# Patient Record
Sex: Female | Born: 1945
Health system: Southern US, Community
[De-identification: ages and names within clinical notes are randomized; demographics above are authoritative.]

## PROBLEM LIST (undated history)

## (undated) DIAGNOSIS — E7800 Pure hypercholesterolemia, unspecified: Secondary | ICD-10-CM

## (undated) DIAGNOSIS — G4730 Sleep apnea, unspecified: Secondary | ICD-10-CM

## (undated) DIAGNOSIS — R011 Cardiac murmur, unspecified: Secondary | ICD-10-CM

## (undated) DIAGNOSIS — D699 Hemorrhagic condition, unspecified: Secondary | ICD-10-CM

## (undated) DIAGNOSIS — E559 Vitamin D deficiency, unspecified: Secondary | ICD-10-CM

## (undated) DIAGNOSIS — C539 Malignant neoplasm of cervix uteri, unspecified: Secondary | ICD-10-CM

## (undated) DIAGNOSIS — I10 Essential (primary) hypertension: Secondary | ICD-10-CM

## (undated) DIAGNOSIS — F99 Mental disorder, not otherwise specified: Secondary | ICD-10-CM

## (undated) DIAGNOSIS — E785 Hyperlipidemia, unspecified: Secondary | ICD-10-CM

## (undated) DIAGNOSIS — E119 Type 2 diabetes mellitus without complications: Secondary | ICD-10-CM

## (undated) HISTORY — DX: Mental disorder, not otherwise specified: F99

## (undated) HISTORY — PX: TONSILLECTOMY: SUR1361

## (undated) HISTORY — PX: SALPINGECTOMY: SHX328

## (undated) HISTORY — DX: Cardiac murmur, unspecified: R01.1

## (undated) HISTORY — DX: Essential (primary) hypertension: I10

## (undated) HISTORY — DX: Type 2 diabetes mellitus without complications: E11.9

## (undated) HISTORY — DX: Vitamin D deficiency, unspecified: E55.9

## (undated) HISTORY — PX: COLOSTOMY: SHX63

## (undated) HISTORY — DX: Pure hypercholesterolemia, unspecified: E78.00

## (undated) HISTORY — PX: TONSILLECTOMY AND ADENOIDECTOMY: SHX28

## (undated) HISTORY — PX: CHOLECYSTECTOMY: SHX55

## (undated) HISTORY — DX: Hyperlipidemia, unspecified: E78.5

## (undated) HISTORY — PX: BILATERAL OOPHORECTOMY: SHX1221

## (undated) HISTORY — DX: Sleep apnea, unspecified: G47.30

## (undated) HISTORY — DX: Malignant neoplasm of cervix uteri, unspecified: C53.9

## (undated) HISTORY — DX: Hemorrhagic condition, unspecified: D69.9

---

## 2008-05-19 DIAGNOSIS — C539 Malignant neoplasm of cervix uteri, unspecified: Secondary | ICD-10-CM

## 2008-05-19 HISTORY — DX: Malignant neoplasm of cervix uteri, unspecified: C53.9

## 2011-04-13 DIAGNOSIS — C539 Malignant neoplasm of cervix uteri, unspecified: Secondary | ICD-10-CM | POA: Insufficient documentation

## 2012-11-13 DIAGNOSIS — M5136 Other intervertebral disc degeneration, lumbar region: Secondary | ICD-10-CM | POA: Insufficient documentation

## 2016-11-06 ENCOUNTER — Encounter: Payer: Self-pay | Admitting: *Deleted

## 2016-12-11 ENCOUNTER — Other Ambulatory Visit: Payer: Self-pay | Admitting: Obstetrics and Gynecology

## 2016-12-19 ENCOUNTER — Other Ambulatory Visit (HOSPITAL_COMMUNITY)
Admission: RE | Admit: 2016-12-19 | Discharge: 2016-12-19 | Disposition: A | Discharge status: Home / Self Care | Payer: Medicare Other | Source: Ambulatory Visit | Attending: Obstetrics and Gynecology | Admitting: Obstetrics and Gynecology

## 2016-12-19 ENCOUNTER — Encounter (INDEPENDENT_AMBULATORY_CARE_PROVIDER_SITE_OTHER): Payer: Self-pay

## 2016-12-19 ENCOUNTER — Encounter: Payer: Self-pay | Admitting: Obstetrics and Gynecology

## 2016-12-19 ENCOUNTER — Ambulatory Visit (INDEPENDENT_AMBULATORY_CARE_PROVIDER_SITE_OTHER): Payer: Medicare Other | Admitting: Obstetrics and Gynecology

## 2016-12-19 VITALS — BP 158/70 | HR 70 | Ht 64.5 in | Wt 164.0 lb

## 2016-12-19 DIAGNOSIS — Z8541 Personal history of malignant neoplasm of cervix uteri: Secondary | ICD-10-CM

## 2016-12-19 DIAGNOSIS — Z124 Encounter for screening for malignant neoplasm of cervix: Secondary | ICD-10-CM | POA: Diagnosis present

## 2016-12-19 NOTE — Progress Notes (Signed)
Patient ID: Jessica Mclean, female   DOB: [birth year], 71 y.o.   MRN: [id number]  Assessment:  Annual Gyn Exam  History of cervical cancer stage #2  B treated with radiation therapy 8 years ago history of radiation colitis requiring colostomy and reversal   Plan:  1. pap smear done, next pap due 1 year 2. return annually or prn 3    Annual mammogram advised Subjective:  Jessica Mclean is a 71 y.o. female G5P0 who presents for annual exam. No LMP recorded. Patient is postmenopausal. The patient has complaints today of having a past medical history of 2B cervical cancer (8 years ago) and needing a pap smear today. She states that she was informed by her PCP that she has had unreadable pap smears for several years. Pt states that following her initial dx, she had pap smears completed q 1 month, then q 3 months, then q 6 months, and now annually. Pt reports that following radiation treatment, she had a colostomy bag that was reversed. Pt reports that she has an appointment with her PCP later today. Pt reports that she is initially from [place]. Pt denies any other symptoms. She notes that she has had a left salpingo-oophorectomy.   The following portions of the patient's history were reviewed and updated as appropriate: allergies, current medications, past family history, past medical history, past social history, past surgical history and problem list. Past Medical History:  Diagnosis Date  . Cervical cancer ([place]) 2010  . Diabetes mellitus without complication ([place])    type 2  . HLD (hyperlipidemia)   . Hypertension   . Mental disorder    depression; suicidal ideation in 2017  . Vitamin D deficiency     Past Surgical History:  Procedure Laterality Date  . BILATERAL OOPHORECTOMY    . CESAREAN SECTION     x 4  . CHOLECYSTECTOMY    . COLOSTOMY     x2  . SALPINGECTOMY    . TONSILLECTOMY AND ADENOIDECTOMY       Current Outpatient Prescriptions:  .  amLODipine (NORVASC) 5 MG tablet,  Take 5 mg by mouth daily., Disp: , Rfl:  .  aspirin 81 MG chewable tablet, Chew 81 mg by mouth daily., Disp: , Rfl:  .  atorvastatin (LIPITOR) 40 MG tablet, Take 40 mg by mouth daily., Disp: , Rfl:  .  Cholecalciferol (VITAMIN D3) 1000 units CAPS, Take 1 capsule by mouth daily., Disp: , Rfl:  .  CRANBERRY EXTRACT PO, Take by mouth. gummies-daily, Disp: , Rfl:  .  DULoxetine (CYMBALTA) 20 MG capsule, Take 20 mg by mouth daily., Disp: , Rfl:  .  gabapentin (NEURONTIN) 100 MG capsule, Take 100 mg by mouth 3 (three) times daily., Disp: , Rfl:  .  glipiZIDE (GLUCOTROL) 5 MG tablet, Take by mouth daily before breakfast., Disp: , Rfl:  .  insulin regular (NOVOLIN R,HUMULIN R) 100 units/mL injection, Inject into the skin 2 (two) times daily before a meal. , Disp: , Rfl:  .  lisinopril (PRINIVIL,ZESTRIL) 20 MG tablet, Take 20 mg by mouth daily., Disp: , Rfl:  .  metFORMIN (GLUCOPHAGE) 1000 MG tablet, Take 1,000 mg by mouth daily with breakfast., Disp: , Rfl:  .  OMEGA-3-ACID ETH EST, DIETARY, PO, Take by mouth daily. , Disp: , Rfl:  .  polyethylene glycol powder (MIRALAX) powder, Take 1 Container by mouth as needed., Disp: , Rfl:  .  valACYclovir (VALTREX) 1000 MG tablet, Take 1,000 mg by mouth as needed. , Disp: ,  Rfl:   Review of Systems Constitutional: negative Gastrointestinal: negative Genitourinary: negative   Objective:  BP (!) 158/70 (BP Location: Left Arm, Patient Position: Sitting, Cuff Size: Normal)   Pulse 70   Ht 5' 4.5" (1.638 m)   Wt 164 lb (74.4 kg)   BMI 27.72 kg/m    BMI: Body mass index is 27.72 kg/m.  General Appearance: Alert, appropriate appearance for age. No acute distress HEENT: Grossly normal Neck / Thyroid:  Cardiovascular: RRR; normal S1, S2, no murmur Lungs: CTA bilaterally Back: No CVAT Breast Exam: No dimpling, nipple retraction or discharge. No masses or nodes., Normal to inspection, Normal breast tissue bilaterally and No masses or nodes.No dimpling, nipple  retraction or discharge. Gastrointestinal: Soft, non-tender, no masses or organomegaly. Multiple surgical scars noted to abdomen. Pelvic Exam: Vulva and vagina appear normal. Bimanual exam reveals normal uterus and adnexa. Vaginal: normal without tenderness, induration or masses, normal rugae and Shortened vagina to 4 cm. Atrophic vaginal apex. Well supported. Cervix: normal appearance Adnexa: absent, left Uterus: normal single, nontender Rectal: good sphincter tone, no masses, guaiac negative and extensive anal skin tags Rectovaginal: not indicated Lymphatic Exam: Non-palpable nodes in  axillary, or inguinal regions  Skin: no rash or abnormalities Neurologic: Normal gait and speech, no tremor  Psychiatric: Alert and oriented, appropriate affect.   Urinalysis:Not done  Mallory Shirk. MD Pgr [phone] 1:30 PM   By signing my name below, I, Margit Banda, attest that this documentation has been prepared under the direction and in the presence of Jonnie Kind, MD. Electronically Signed: Margit Banda, Medical Scribe. 12/19/16. 1:31 PM.  I personally performed the services described in this documentation, which was SCRIBED in my presence. The recorded information has been reviewed and considered accurate. It has been edited as necessary during review. Jonnie Kind, MD

## 2016-12-23 LAB — CYTOLOGY - PAP
DIAGNOSIS: NEGATIVE
HPV: NOT DETECTED

## 2017-07-02 DIAGNOSIS — B351 Tinea unguium: Secondary | ICD-10-CM | POA: Diagnosis not present

## 2017-07-02 DIAGNOSIS — Z794 Long term (current) use of insulin: Secondary | ICD-10-CM | POA: Diagnosis not present

## 2017-07-02 DIAGNOSIS — I10 Essential (primary) hypertension: Secondary | ICD-10-CM | POA: Diagnosis not present

## 2017-07-02 DIAGNOSIS — E559 Vitamin D deficiency, unspecified: Secondary | ICD-10-CM | POA: Diagnosis not present

## 2017-07-02 DIAGNOSIS — E1140 Type 2 diabetes mellitus with diabetic neuropathy, unspecified: Secondary | ICD-10-CM | POA: Diagnosis not present

## 2017-09-29 ENCOUNTER — Other Ambulatory Visit (HOSPITAL_COMMUNITY): Payer: Self-pay | Admitting: Family Medicine

## 2017-09-29 ENCOUNTER — Encounter: Payer: Self-pay | Admitting: Orthopaedic Surgery

## 2017-09-29 ENCOUNTER — Ambulatory Visit (HOSPITAL_COMMUNITY)
Admission: RE | Admit: 2017-09-29 | Discharge: 2017-09-29 | Disposition: A | Discharge status: Home / Self Care | Payer: Medicare Other | Source: Ambulatory Visit | Attending: Family Medicine | Admitting: Family Medicine

## 2017-09-29 DIAGNOSIS — R059 Cough, unspecified: Secondary | ICD-10-CM

## 2017-09-29 DIAGNOSIS — R05 Cough: Secondary | ICD-10-CM | POA: Diagnosis not present

## 2017-09-29 DIAGNOSIS — E559 Vitamin D deficiency, unspecified: Secondary | ICD-10-CM | POA: Diagnosis not present

## 2017-09-29 DIAGNOSIS — I10 Essential (primary) hypertension: Secondary | ICD-10-CM | POA: Diagnosis not present

## 2017-09-29 DIAGNOSIS — E119 Type 2 diabetes mellitus without complications: Secondary | ICD-10-CM | POA: Diagnosis not present

## 2017-09-29 DIAGNOSIS — R5383 Other fatigue: Secondary | ICD-10-CM | POA: Diagnosis not present

## 2017-10-13 DIAGNOSIS — E119 Type 2 diabetes mellitus without complications: Secondary | ICD-10-CM | POA: Diagnosis not present

## 2017-10-13 DIAGNOSIS — I10 Essential (primary) hypertension: Secondary | ICD-10-CM | POA: Diagnosis not present

## 2017-10-30 DIAGNOSIS — R300 Dysuria: Secondary | ICD-10-CM | POA: Diagnosis not present

## 2017-10-30 DIAGNOSIS — E1169 Type 2 diabetes mellitus with other specified complication: Secondary | ICD-10-CM | POA: Diagnosis not present

## 2017-11-10 DIAGNOSIS — E119 Type 2 diabetes mellitus without complications: Secondary | ICD-10-CM | POA: Diagnosis not present

## 2017-11-10 DIAGNOSIS — M25511 Pain in right shoulder: Secondary | ICD-10-CM | POA: Diagnosis not present

## 2017-11-10 DIAGNOSIS — N390 Urinary tract infection, site not specified: Secondary | ICD-10-CM | POA: Diagnosis not present

## 2017-11-18 DIAGNOSIS — L255 Unspecified contact dermatitis due to plants, except food: Secondary | ICD-10-CM | POA: Diagnosis not present

## 2017-11-18 DIAGNOSIS — I10 Essential (primary) hypertension: Secondary | ICD-10-CM | POA: Diagnosis not present

## 2017-11-18 DIAGNOSIS — Z79899 Other long term (current) drug therapy: Secondary | ICD-10-CM | POA: Diagnosis not present

## 2017-12-22 ENCOUNTER — Ambulatory Visit: Payer: Medicare Other | Admitting: Orthopaedic Surgery

## 2017-12-30 ENCOUNTER — Ambulatory Visit: Payer: Medicare Other | Admitting: Orthopaedic Surgery

## 2018-01-06 ENCOUNTER — Ambulatory Visit (INDEPENDENT_AMBULATORY_CARE_PROVIDER_SITE_OTHER): Payer: Medicare Other

## 2018-01-06 ENCOUNTER — Encounter: Payer: Self-pay | Admitting: Orthopaedic Surgery

## 2018-01-06 ENCOUNTER — Ambulatory Visit (INDEPENDENT_AMBULATORY_CARE_PROVIDER_SITE_OTHER): Payer: Medicare Other | Admitting: Orthopaedic Surgery

## 2018-01-06 VITALS — BP 137/87 | HR 57 | Temp 98.1°F | Ht 64.0 in | Wt 157.0 lb

## 2018-01-06 DIAGNOSIS — E119 Type 2 diabetes mellitus without complications: Secondary | ICD-10-CM

## 2018-01-06 DIAGNOSIS — M25511 Pain in right shoulder: Secondary | ICD-10-CM

## 2018-01-06 DIAGNOSIS — M7530 Calcific tendinitis of unspecified shoulder: Secondary | ICD-10-CM

## 2018-01-06 NOTE — Progress Notes (Signed)
DG  °

## 2018-01-06 NOTE — Progress Notes (Signed)
Subjective:    Patient ID: Jessica Mclean, female    DOB: 1945-10-11, 72 y.o.   MRN: [id number]  HPI She has a long history of shoulder pain.  She has been treated in [place] in the past for bilateral shoulder pain years ago and had an injection in the past which helped.  For the last two years she has had pain in the right shoulder after a fall.  It is gradually getting worse. She has pain when trying to raise her arm over head. She has pain when gardening which she loves to do.  She has pain when rolling over on it at night in bed.  She has no swelling, no numbness.  She is concerned it is still hurting and not getting any better.  She has tried ice, heat, Advil, rubs and rest with just slight help. She has been seen by her family doctor and I have read the notes.  She has a service dog with her for her diabetes.  She has passed out in the past and needs the dog to alert someone if she does faint or pass out. Review of Systems  Constitutional: Positive for activity change.  Musculoskeletal: Positive for arthralgias, gait problem and joint swelling.  All other systems reviewed and are negative.  For Review of Systems, all other systems reviewed and are negative.  The following is a summary of the past history medically, past history surgically, known current medicines, social history and family history.  This information is gathered electronically by the computer from prior information and documentation.  I review this each visit and have found including this information at this point in the chart is beneficial and informative.   Past Medical History:  Diagnosis Date  . Cervical cancer ([place]) 2010  . Diabetes mellitus without complication ([place])    type 2  . HLD (hyperlipidemia)   . Hypertension   . Mental disorder    depression; suicidal ideation in 2017  . Vitamin D deficiency     Past Surgical History:  Procedure Laterality Date  . BILATERAL OOPHORECTOMY    . CESAREAN SECTION      x 4  . CHOLECYSTECTOMY    . COLOSTOMY     x2  . SALPINGECTOMY    . TONSILLECTOMY AND ADENOIDECTOMY      Current Outpatient Medications on File Prior to Visit  Medication Sig Dispense Refill  . amLODipine (NORVASC) 5 MG tablet Take 5 mg by mouth daily.    Marland Kitchen aspirin 81 MG chewable tablet Chew 81 mg by mouth daily.    Marland Kitchen atorvastatin (LIPITOR) 40 MG tablet Take 40 mg by mouth daily.    . Cholecalciferol (VITAMIN D3) 1000 units CAPS Take 1 capsule by mouth daily.    Marland Kitchen CRANBERRY EXTRACT PO Take by mouth. gummies-daily    . DULoxetine (CYMBALTA) 20 MG capsule Take 20 mg by mouth daily.    Marland Kitchen gabapentin (NEURONTIN) 100 MG capsule Take 100 mg by mouth 3 (three) times daily.    Marland Kitchen glipiZIDE (GLUCOTROL) 5 MG tablet Take by mouth daily before breakfast.    . insulin regular (NOVOLIN R,HUMULIN R) 100 units/mL injection Inject into the skin 2 (two) times daily before a meal.     . lisinopril (PRINIVIL,ZESTRIL) 20 MG tablet Take 20 mg by mouth daily.    . metFORMIN (GLUCOPHAGE) 1000 MG tablet Take 1,000 mg by mouth daily with breakfast.    . OMEGA-3-ACID ETH EST, DIETARY, PO Take by mouth daily.     Marland Kitchen  polyethylene glycol powder (MIRALAX) powder Take 1 Container by mouth as needed.    . valACYclovir (VALTREX) 1000 MG tablet Take 1,000 mg by mouth as needed.      No current facility-administered medications on file prior to visit.     Social History   Socioeconomic History  . Marital status: Divorced    Spouse name: Not on file  . Number of children: Not on file  . Years of education: Not on file  . Highest education level: Not on file  Occupational History  . Not on file  Social Needs  . Financial resource strain: Not on file  . Food insecurity:    Worry: Not on file    Inability: Not on file  . Transportation needs:    Medical: Not on file    Non-medical: Not on file  Tobacco Use  . Smoking status: Never Smoker  . Smokeless tobacco: Never Used  Substance and Sexual Activity  .  Alcohol use: No  . Drug use: No  . Sexual activity: Not Currently    Birth control/protection: Post-menopausal  Lifestyle  . Physical activity:    Days per week: Not on file    Minutes per session: Not on file  . Stress: Not on file  Relationships  . Social connections:    Talks on phone: Not on file    Gets together: Not on file    Attends religious service: Not on file    Active member of club or organization: Not on file    Attends meetings of clubs or organizations: Not on file    Relationship status: Not on file  . Intimate partner violence:    Fear of current or ex partner: Not on file    Emotionally abused: Not on file    Physically abused: Not on file    Forced sexual activity: Not on file  Other Topics Concern  . Not on file  Social History Narrative  . Not on file    Family History  Problem Relation Age of Onset  . Cancer Father        kidney  . Cancer Mother        lung  . Other Brother        heart issues    BP 137/87   Pulse (!) 57   Temp 98.1 F (36.7 C)   Ht 5\' 4"  (1.626 m)   Wt 157 lb (71.2 kg)   BMI 26.95 kg/m   Body mass index is 26.95 kg/m.     Objective:   Physical Exam  Constitutional: She is oriented to person, place, and time. She appears well-developed and well-nourished.  HENT:  Head: Normocephalic and atraumatic.  Eyes: Pupils are equal, round, and reactive to light. Conjunctivae and EOM are normal.  Neck: Normal range of motion. Neck supple.  Cardiovascular: Normal rate, regular rhythm and intact distal pulses.  Pulmonary/Chest: Effort normal.  Abdominal: Soft.  Musculoskeletal:       Right shoulder: She exhibits decreased range of motion and tenderness.       Arms: Neurological: She is alert and oriented to person, place, and time. She has normal reflexes. She displays normal reflexes. No cranial nerve deficit. She exhibits normal muscle tone. Coordination normal.  Skin: Skin is warm and dry.  Psychiatric: She has a normal  mood and affect. Her behavior is normal. Judgment and thought content normal.   X-rays were done of the right shoulder, reported separately.  Assessment & Plan:   Encounter Diagnoses  Name Primary?  . Pain in joint of right shoulder Yes  . Calcific bursitis of shoulder   . Diabetes mellitus without complication ([place])    PROCEDURE NOTE:  The patient request injection, verbal consent was obtained.  The right shoulder was prepped appropriately after time out was performed.   Sterile technique was observed and injection of 1 cc of Depo-Medrol 40 mg with several cc's of plain xylocaine. Anesthesia was provided by ethyl chloride and a 20-gauge needle was used to inject the shoulder area. A posterior approach was used.  The injection was tolerated well.  A band aid dressing was applied.  The patient was advised to apply ice later today and tomorrow to the injection sight as needed.  I have arranged for her to go to PT/OT for the right shoulder.  I will see her back in two weeks.  She may need a MRI.  Call if any problem.  Precautions discussed.   Electronically Signed Sanjuana Kava, MD 8/21/20193:09 PM

## 2018-01-20 ENCOUNTER — Ambulatory Visit (INDEPENDENT_AMBULATORY_CARE_PROVIDER_SITE_OTHER): Payer: Medicare Other | Admitting: Obstetrics and Gynecology

## 2018-01-20 ENCOUNTER — Other Ambulatory Visit: Payer: Self-pay

## 2018-01-20 ENCOUNTER — Other Ambulatory Visit (HOSPITAL_COMMUNITY)
Admission: RE | Admit: 2018-01-20 | Discharge: 2018-01-20 | Disposition: A | Discharge status: Home / Self Care | Payer: Medicare Other | Source: Ambulatory Visit | Attending: Obstetrics and Gynecology | Admitting: Obstetrics and Gynecology

## 2018-01-20 ENCOUNTER — Ambulatory Visit (HOSPITAL_COMMUNITY): Payer: Medicare Other | Attending: Internal Medicine | Admitting: Occupational Therapy

## 2018-01-20 ENCOUNTER — Encounter (HOSPITAL_COMMUNITY): Payer: Self-pay | Admitting: Occupational Therapy

## 2018-01-20 ENCOUNTER — Encounter: Payer: Self-pay | Admitting: Obstetrics and Gynecology

## 2018-01-20 VITALS — BP 159/70 | HR 70 | Ht 64.5 in | Wt 162.0 lb

## 2018-01-20 DIAGNOSIS — G8929 Other chronic pain: Secondary | ICD-10-CM

## 2018-01-20 DIAGNOSIS — R29898 Other symptoms and signs involving the musculoskeletal system: Secondary | ICD-10-CM

## 2018-01-20 DIAGNOSIS — Z01419 Encounter for gynecological examination (general) (routine) without abnormal findings: Secondary | ICD-10-CM | POA: Diagnosis not present

## 2018-01-20 DIAGNOSIS — M25511 Pain in right shoulder: Secondary | ICD-10-CM | POA: Insufficient documentation

## 2018-01-20 DIAGNOSIS — Z124 Encounter for screening for malignant neoplasm of cervix: Secondary | ICD-10-CM

## 2018-01-20 DIAGNOSIS — Z8541 Personal history of malignant neoplasm of cervix uteri: Secondary | ICD-10-CM

## 2018-01-20 NOTE — Therapy (Signed)
[facility] [address] [place], [place], [address] Phone: [phone]   Fax:  [phone]  Occupational Therapy Evaluation  Patient Details  Name: Lakeeta Dobosz MRN: [id number] Date of Birth: 1945/06/13 Referring Provider: Dr. Sanjuana Kava   Encounter Date: 01/20/2018  OT End of Session - 01/20/18 1438    Visit Number  1    Number of Visits  1    Date for OT Re-Evaluation  01/21/18    Authorization Type  1) Medicare A and B 2) [employer] Medicare    Authorization Time Period  Visits based on medical necessity     OT Start Time  1116    OT Stop Time  1158    OT Time Calculation (min)  42 min    Activity Tolerance  Patient tolerated treatment well    Behavior During Therapy  [facility] for tasks assessed/performed       Past Medical History:  Diagnosis Date  . Cervical cancer ([place]) 2010  . Diabetes mellitus without complication ([place])    type 2  . HLD (hyperlipidemia)   . Hypertension   . Mental disorder    depression; suicidal ideation in 2017  . Vitamin D deficiency     Past Surgical History:  Procedure Laterality Date  . BILATERAL OOPHORECTOMY    . CESAREAN SECTION     x 4  . CHOLECYSTECTOMY    . COLOSTOMY     x2  . SALPINGECTOMY    . TONSILLECTOMY AND ADENOIDECTOMY      There were no vitals filed for this visit.  Subjective Assessment - 01/20/18 1417    Subjective   S: My shoulder feels better after my injection last week.     Pertinent History  Pt is a 72 y/o female with chronic R shoulder pain. Pt reports having had R rotator cuff tear from a few years ago without surgical repair. She reports decreased pain in R shoulder since injection received approximately 1 week ago. Pt was referred to OT for evaluation and treatment by Dr. Sanjuana Kava.     Special Tests  FOTO: 34/100    Patient Stated Goals  To decrease pain and be able to use RUE with greater ease during functional tasks.    Currently in Pain?  No/denies         [facility] OT Assessment - 01/20/18 1113      Assessment   Medical Diagnosis  right shoulder pain    Referring Provider  Dr. Sanjuana Kava    Onset Date/Surgical Date  --   chronic   Hand Dominance  Right    Next MD Visit  01/21/18    Prior Therapy  None      Precautions   Precautions  None      Restrictions   Weight Bearing Restrictions  No      Balance Screen   Has the patient fallen in the past 6 months  Yes    How many times?  2    Has the patient had a decrease in activity level because of a fear of falling?   No    Is the patient reluctant to leave their home because of a fear of falling?   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 Pt experiences constant pain in RUE limiting activity tolerance and ability to perform functional tasks. Pt reports improvement in all of the aforementioned areas since injection.       Mobility   Mobility Status  Independent      Written Expression   Dominant Hand  Right      Vision - History   Baseline Vision  Wears glasses all the time    Additional Comments  --      Cognition   Overall Cognitive Status  Within Functional Limits for tasks assessed      Observation/Other Assessments   Focus on Therapeutic Outcomes (FOTO)   34/100      ROM / Strength   AROM / PROM / Strength  AROM;PROM;Strength      AROM   Overall AROM Comments  Assessed seated, er/IR adducted    AROM Assessment Site  Shoulder    Right/Left Shoulder  Right    Right Shoulder Flexion  140 Degrees    Right Shoulder ABduction  150 Degrees    Right Shoulder Internal Rotation  90 Degrees    Right Shoulder External Rotation  85 Degrees      PROM   Overall PROM   Within functional limits for tasks performed    PROM Assessment Site  --    Right/Left Shoulder  --      Strength    Overall Strength Comments  Assessed seated, er/IR adducted    Strength Assessment Site  Shoulder    Right/Left Shoulder  Right    Right Shoulder Flexion  5/5    Right Shoulder ABduction  4+/5    Right Shoulder Internal Rotation  5/5    Right Shoulder External Rotation  5/5                      OT Education - 01/20/18 1201    Education Details  green scapular theraband, shoulder stretches    Person(s) Educated  Patient    Methods  Explanation;Demonstration;Handout    Comprehension  Verbalized understanding;Returned demonstration       OT Short Term Goals - 01/20/18 1450      OT SHORT TERM GOAL #1   Title  Pt will be educated and able to independently perform HEP for improved RUE functional use.     Time  1    Period  Days    Status  Achieved    Target Date  01/20/18               Plan - 01/20/18 1440    Clinical Impression Statement  A: Pt is a 72 y/o female presenting with chronic R shoulder pain, limiting ability to perform functional tasks with ease. Pt received injection approximately 1 week ago and reports minimal pain since injection. During evaluation pt demonstrating ROM and strength WFL. Pt requesting HEP only, provided with scapular theraband exercises and shoulder stretches. Pt performed exercises with good form and technique.     Occupational Profile and client history currently impacting functional performance  Pt is independent with ADLs and IADLs and is motivated to maintain current functional level.     Occupational performance deficits (Please refer to evaluation for details):  ADL's;IADL's;Leisure    Rehab Potential  Good    OT Frequency  One time visit    OT Treatment/Interventions  Patient/family education    Plan  P: Pt was seen by OT for one  time visit. She was provided with education and HEP to improve pain and functional use of RUE during ADLs.     Clinical Decision Making  Limited treatment options, no task modification necessary     OT Home Exercise Plan  01/20/18 scapular theraband and shoulder stretches    Consulted and Agree with Plan of Care  Patient       Patient will benefit from skilled therapeutic intervention in order to improve the following deficits and impairments:  Decreased activity tolerance, Impaired flexibility, Decreased strength, Pain  Visit Diagnosis: Chronic right shoulder pain  Other symptoms and signs involving the musculoskeletal system    Problem List Patient Active Problem List   Diagnosis Date Noted  . Diabetes mellitus without complication [facility]) 25/83/4621   Guadelupe Sabin, OTR/L  [phone] 01/20/2018, 2:52 PM  [facility] [address] [place], [place], [address] Phone: [phone]   Fax:  [phone]  Name: Lauryl Seyer MRN: [id number] Date of Birth: [birth year]

## 2018-01-20 NOTE — Patient Instructions (Signed)
(  Home) Extension: Isometric / Bilateral Arm Retraction - Sitting   Facing anchor, hold hands and elbow at shoulder height, with elbow bent.  Pull arms back to squeeze shoulder blades together. Repeat 10-15 times. 1-3 times/day.   (Clinic) Extension / Flexion (Assist)   Face anchor, pull arms back, keeping elbow straight, and squeze shoulder blades together. Repeat 10-15 times. 1-3 times/day.   Copyright  VHI. All rights reserved.   (Home) Retraction: Row - Bilateral (Anchor)   Facing anchor, arms reaching forward, pull hands toward stomach, keeping elbows bent and at your sides and pinching shoulder blades together. Repeat 10-15 times. 1-3 times/day.   Copyright  VHI. All rights reserved.     1) Flexion Wall Stretch    Face wall, place affected handon wall in front of you. Slide hand up the wall  and lean body in towards the wall. Hold for 10 seconds. Repeat 3-5 times. 1-2 times/day.     2) Towel Stretch with Internal Rotation   Or     Gently pull up (or to the side) your affected arm  behind your back with the assist of a towel. Hold 10 seconds, repeat 3-5 times. 1-2 times/day.             3) Corner Stretch    Stand at a corner of a wall, place your arms on the walls with elbows bent. Lean into the corner until a stretch is felt along the front of your chest and/or shoulders. Hold for 10 seconds. Repeat 3-5X, 1-2 times/day.    4) Posterior Capsule Stretch    Bring the involved arm across chest. Grasp elbow and pull toward chest until you feel a stretch in the back of the upper arm and shoulder. Hold 10 seconds. Repeat 3-5X. Complete 1-2 times/day.

## 2018-01-20 NOTE — Progress Notes (Signed)
Patient ID: Jessica Mclean, female   DOB: [birth year], 72 y.o.   MRN: [id number]   Assessment:  Annual Gyn Exam Plan:  1. return annually Subjective:  Jessica Mclean is a 72 y.o. female G5P0 who presents for annual exam. No LMP recorded. Patient is postmenopausal.  She has hx of cervical cancer, she had radiation therapy and was overdosed due to dosage miscalculation, and ended up with colostomy bag and was in a wheelchair for a few years.  The following portions of the patient's history were reviewed and updated as appropriate: allergies, current medications, past family history, past medical history, past social history, past surgical history and problem list. Past Medical History:  Diagnosis Date  . Cervical cancer ([place]) 2010  . Diabetes mellitus without complication ([place])    type 2  . HLD (hyperlipidemia)   . Hypertension   . Mental disorder    depression; suicidal ideation in 2017  . Vitamin D deficiency     Past Surgical History:  Procedure Laterality Date  . BILATERAL OOPHORECTOMY    . CESAREAN SECTION     x 4  . CHOLECYSTECTOMY    . COLOSTOMY     x2  . SALPINGECTOMY    . TONSILLECTOMY AND ADENOIDECTOMY       Current Outpatient Medications:  .  amLODipine (NORVASC) 5 MG tablet, Take 5 mg by mouth daily., Disp: , Rfl:  .  aspirin 81 MG chewable tablet, Chew 81 mg by mouth daily., Disp: , Rfl:  .  atorvastatin (LIPITOR) 40 MG tablet, Take 40 mg by mouth daily., Disp: , Rfl:  .  Cholecalciferol (VITAMIN D3) 1000 units CAPS, Take 1 capsule by mouth daily., Disp: , Rfl:  .  CRANBERRY EXTRACT PO, Take by mouth. gummies-daily, Disp: , Rfl:  .  DULoxetine (CYMBALTA) 20 MG capsule, Take 20 mg by mouth daily., Disp: , Rfl:  .  gabapentin (NEURONTIN) 100 MG capsule, Take 100 mg by mouth 3 (three) times daily., Disp: , Rfl:  .  glipiZIDE (GLUCOTROL) 5 MG tablet, Take by mouth daily before breakfast., Disp: , Rfl:  .  insulin regular (NOVOLIN R,HUMULIN R) 100 units/mL injection,  Inject into the skin 2 (two) times daily before a meal. , Disp: , Rfl:  .  lisinopril (PRINIVIL,ZESTRIL) 20 MG tablet, Take 20 mg by mouth daily., Disp: , Rfl:  .  metFORMIN (GLUCOPHAGE) 1000 MG tablet, Take 1,000 mg by mouth daily with breakfast., Disp: , Rfl:  .  OMEGA-3-ACID ETH EST, DIETARY, PO, Take by mouth daily. , Disp: , Rfl:  .  polyethylene glycol powder (MIRALAX) powder, Take 1 Container by mouth as needed., Disp: , Rfl:  .  valACYclovir (VALTREX) 1000 MG tablet, Take 1,000 mg by mouth as needed. , Disp: , Rfl:   Review of Systems Constitutional: negative Gastrointestinal: negative Genitourinary: normal  Objective:  There were no vitals taken for this visit.   BMI: There is no height or weight on file to calculate BMI.  General Appearance: Alert, appropriate appearance for age. No acute distress HEENT: Grossly normal Neck / Thyroid:  Cardiovascular: RRR; normal S1, S2, no murmur Lungs: CTA bilaterally Back: No CVAT Breast Exam: normal for age, no nodules no tenderness Gastrointestinal: Soft, non-tender, no masses or organomegaly Pelvic Exam:  VAGINA: shortened, 5 cm in length  CERVIX: undetectable UTERUS: atropihc PAP: Pap smear done today. Scanty tissue obtainable Lymphatic Exam: Non-palpable nodes in neck, clavicular, axillary, or inguinal regions  Skin: no rash or abnormalities Neurologic: Normal gait and  speech, no tremor  Psychiatric: Alert and oriented, appropriate affect.  Urinalysis:Not done  By signing my name below, I, Samul Dada, attest that this documentation has been prepared under the direction and in the presence of Jonnie Kind, MD. Electronically Signed: [place]. 01/20/18. 1:30 PM.  I personally performed the services described in this documentation, which was SCRIBED in my presence. The recorded information has been reviewed and considered accurate. It has been edited as necessary during review. Jonnie Kind,  MD

## 2018-01-21 ENCOUNTER — Ambulatory Visit: Payer: Medicare Other | Admitting: Orthopaedic Surgery

## 2018-01-22 ENCOUNTER — Other Ambulatory Visit: Payer: Self-pay

## 2018-01-22 LAB — CYTOLOGY - PAP
Diagnosis: NEGATIVE
HPV: NOT DETECTED

## 2018-01-22 NOTE — Patient Outreach (Signed)
[facility]) Care Management  01/22/2018  Jessica Mclean Oct 09, 1945 [id number]   Medication Adherence call to [name] left a message for patient to call back patient is due on Atorvastatin 40 mg and Losartan 25 mg.[name] is showing past due under [place].   [place] Management Direct Dial [phone]  Fax [phone] [email or web address]

## 2018-01-26 DIAGNOSIS — I10 Essential (primary) hypertension: Secondary | ICD-10-CM | POA: Diagnosis not present

## 2018-01-26 DIAGNOSIS — E785 Hyperlipidemia, unspecified: Secondary | ICD-10-CM | POA: Diagnosis not present

## 2018-01-26 DIAGNOSIS — E119 Type 2 diabetes mellitus without complications: Secondary | ICD-10-CM | POA: Diagnosis not present

## 2018-01-27 ENCOUNTER — Ambulatory Visit: Payer: Medicare Other | Admitting: Orthopaedic Surgery

## 2018-03-09 DIAGNOSIS — E785 Hyperlipidemia, unspecified: Secondary | ICD-10-CM | POA: Diagnosis not present

## 2018-03-09 DIAGNOSIS — Z7984 Long term (current) use of oral hypoglycemic drugs: Secondary | ICD-10-CM | POA: Diagnosis not present

## 2018-03-09 DIAGNOSIS — I10 Essential (primary) hypertension: Secondary | ICD-10-CM | POA: Diagnosis not present

## 2018-03-09 DIAGNOSIS — E119 Type 2 diabetes mellitus without complications: Secondary | ICD-10-CM | POA: Diagnosis not present

## 2018-03-09 DIAGNOSIS — Z794 Long term (current) use of insulin: Secondary | ICD-10-CM | POA: Diagnosis not present

## 2018-03-09 DIAGNOSIS — Z23 Encounter for immunization: Secondary | ICD-10-CM | POA: Diagnosis not present

## 2018-03-09 DIAGNOSIS — E1140 Type 2 diabetes mellitus with diabetic neuropathy, unspecified: Secondary | ICD-10-CM | POA: Diagnosis not present

## 2018-03-11 DIAGNOSIS — Z23 Encounter for immunization: Secondary | ICD-10-CM | POA: Diagnosis not present

## 2018-06-21 DIAGNOSIS — E1169 Type 2 diabetes mellitus with other specified complication: Secondary | ICD-10-CM | POA: Diagnosis not present

## 2018-06-21 DIAGNOSIS — I10 Essential (primary) hypertension: Secondary | ICD-10-CM | POA: Diagnosis not present

## 2018-06-21 DIAGNOSIS — E785 Hyperlipidemia, unspecified: Secondary | ICD-10-CM | POA: Diagnosis not present

## 2018-06-22 DIAGNOSIS — E1140 Type 2 diabetes mellitus with diabetic neuropathy, unspecified: Secondary | ICD-10-CM | POA: Diagnosis not present

## 2018-06-22 DIAGNOSIS — Z794 Long term (current) use of insulin: Secondary | ICD-10-CM | POA: Diagnosis not present

## 2018-06-22 DIAGNOSIS — Z7984 Long term (current) use of oral hypoglycemic drugs: Secondary | ICD-10-CM | POA: Diagnosis not present

## 2018-06-22 DIAGNOSIS — Z23 Encounter for immunization: Secondary | ICD-10-CM | POA: Diagnosis not present

## 2018-06-22 DIAGNOSIS — I10 Essential (primary) hypertension: Secondary | ICD-10-CM | POA: Diagnosis not present

## 2018-06-22 DIAGNOSIS — E785 Hyperlipidemia, unspecified: Secondary | ICD-10-CM | POA: Diagnosis not present

## 2018-07-28 ENCOUNTER — Other Ambulatory Visit: Payer: Self-pay

## 2018-07-28 ENCOUNTER — Ambulatory Visit (INDEPENDENT_AMBULATORY_CARE_PROVIDER_SITE_OTHER): Payer: Medicare Other | Admitting: Orthopaedic Surgery

## 2018-07-28 ENCOUNTER — Encounter: Payer: Self-pay | Admitting: Orthopaedic Surgery

## 2018-07-28 VITALS — BP 130/87 | HR 69 | Ht 64.5 in | Wt 163.0 lb

## 2018-07-28 DIAGNOSIS — M7530 Calcific tendinitis of unspecified shoulder: Secondary | ICD-10-CM

## 2018-07-28 DIAGNOSIS — M25511 Pain in right shoulder: Secondary | ICD-10-CM | POA: Diagnosis not present

## 2018-07-28 DIAGNOSIS — G8929 Other chronic pain: Secondary | ICD-10-CM

## 2018-07-28 NOTE — Progress Notes (Signed)
PROCEDURE NOTE:  The patient request injection, verbal consent was obtained.  The right shoulder was prepped appropriately after time out was performed.   Sterile technique was observed and injection of 1 cc of Depo-Medrol 40 mg with several cc's of plain xylocaine. Anesthesia was provided by ethyl chloride and a 20-gauge needle was used to inject the shoulder area. A posterior approach was used.  The injection was tolerated well.  A band aid dressing was applied.  The patient was advised to apply ice later today and tomorrow to the injection sight as needed.  I will schedule MRI of the right shoulder as she still has significant pain and decreased motion.  Encounter Diagnoses  Name Primary?  . Pain in joint of right shoulder Yes  . Calcific bursitis of shoulder    Return after MRI.  Call if any problem.  Precautions discussed.   Electronically Signed Sanjuana Kava, MD 3/11/20202:05 PM

## 2018-07-28 NOTE — Patient Instructions (Signed)
Your MRI has been ordered.  We will contact your insurance company for approval. [facility] [address] [place], [place].  Their scheduling number is [phone].  They will call you to schedule the appointment after the study has been given an authorization number.  If you have not been given an appointment within within 5 business days please call [phone] and ask for the pre-authorization representative in our office.  

## 2018-10-14 DIAGNOSIS — E1140 Type 2 diabetes mellitus with diabetic neuropathy, unspecified: Secondary | ICD-10-CM | POA: Diagnosis not present

## 2018-10-14 DIAGNOSIS — Z79899 Other long term (current) drug therapy: Secondary | ICD-10-CM | POA: Diagnosis not present

## 2018-10-14 DIAGNOSIS — Z794 Long term (current) use of insulin: Secondary | ICD-10-CM | POA: Diagnosis not present

## 2018-10-14 DIAGNOSIS — I10 Essential (primary) hypertension: Secondary | ICD-10-CM | POA: Diagnosis not present

## 2018-10-14 DIAGNOSIS — E785 Hyperlipidemia, unspecified: Secondary | ICD-10-CM | POA: Diagnosis not present

## 2018-11-08 DIAGNOSIS — Z794 Long term (current) use of insulin: Secondary | ICD-10-CM | POA: Diagnosis not present

## 2018-11-08 DIAGNOSIS — E119 Type 2 diabetes mellitus without complications: Secondary | ICD-10-CM | POA: Diagnosis not present

## 2018-11-08 DIAGNOSIS — Z7984 Long term (current) use of oral hypoglycemic drugs: Secondary | ICD-10-CM | POA: Diagnosis not present

## 2018-12-20 DIAGNOSIS — M25411 Effusion, right shoulder: Secondary | ICD-10-CM | POA: Diagnosis not present

## 2018-12-20 DIAGNOSIS — M19011 Primary osteoarthritis, right shoulder: Secondary | ICD-10-CM | POA: Diagnosis not present

## 2018-12-20 DIAGNOSIS — M67911 Unspecified disorder of synovium and tendon, right shoulder: Secondary | ICD-10-CM | POA: Diagnosis not present

## 2018-12-20 DIAGNOSIS — S46011A Strain of muscle(s) and tendon(s) of the rotator cuff of right shoulder, initial encounter: Secondary | ICD-10-CM | POA: Diagnosis not present

## 2018-12-23 ENCOUNTER — Encounter: Payer: Self-pay | Admitting: Orthopaedic Surgery

## 2018-12-23 ENCOUNTER — Other Ambulatory Visit: Payer: Self-pay

## 2018-12-23 ENCOUNTER — Ambulatory Visit (INDEPENDENT_AMBULATORY_CARE_PROVIDER_SITE_OTHER): Payer: Medicare Other | Admitting: Orthopaedic Surgery

## 2018-12-23 VITALS — BP 117/72 | HR 62 | Temp 97.6°F | Ht 64.0 in | Wt 154.0 lb

## 2018-12-23 DIAGNOSIS — M25511 Pain in right shoulder: Secondary | ICD-10-CM

## 2018-12-23 NOTE — Progress Notes (Signed)
PROCEDURE NOTE:  The patient request injection, verbal consent was obtained.  The right shoulder was prepped appropriately after time out was performed.   Sterile technique was observed and injection of 1 cc of Depo-Medrol 40 mg with several cc's of plain xylocaine. Anesthesia was provided by ethyl chloride and a 20-gauge needle was used to inject the shoulder area. A posterior approach was used.  The injection was tolerated well.  A band aid dressing was applied.  The patient was advised to apply ice later today and tomorrow to the injection sight as needed.  Her MRI done 12-20-2018 of the right shoulder shows supraspinatus high-grade partial tear with likely full-thickness components.  Retraction of the torn fibers without substantial fatty muscle atrophy.  She has fluid along biceps tendon with no tear.  I have explained the findings to her.  I will have Dr. Aline Brochure see her for possible surgery.    Electronically Signed Sanjuana Kava, MD 8/6/202011:47 AM

## 2018-12-27 ENCOUNTER — Encounter: Payer: Self-pay | Admitting: Orthopedic Surgery

## 2018-12-27 ENCOUNTER — Other Ambulatory Visit: Payer: Self-pay

## 2018-12-27 ENCOUNTER — Ambulatory Visit (INDEPENDENT_AMBULATORY_CARE_PROVIDER_SITE_OTHER): Payer: Medicare Other | Admitting: Orthopedic Surgery

## 2018-12-27 VITALS — BP 121/58 | HR 75 | Ht 64.0 in | Wt 155.0 lb

## 2018-12-27 DIAGNOSIS — M75121 Complete rotator cuff tear or rupture of right shoulder, not specified as traumatic: Secondary | ICD-10-CM

## 2018-12-27 DIAGNOSIS — M75122 Complete rotator cuff tear or rupture of left shoulder, not specified as traumatic: Secondary | ICD-10-CM | POA: Diagnosis not present

## 2018-12-27 NOTE — Progress Notes (Signed)
Jessica Mclean  12/27/2018  HISTORY SECTION :  Chief Complaint  Patient presents with  . Shoulder Pain    right    HPI 73 year old female referred to me by Dr. Luna Glasgow for possible surgery surgery long history of right shoulder pain.  Status post injection home physical therapy which she was only moderately compliant with  Complains of pain in the right shoulder dull ache moderate currently taking Excedrin location  MRI positive for supraspinatus tendon tear with 2 cm retraction Review of Systems  Constitutional: Negative for chills and fever.  Respiratory: Negative for shortness of breath.   Cardiovascular: Negative for chest pain.  Psychiatric/Behavioral: The patient is nervous/anxious.      Past Medical History:  Diagnosis Date  . Cervical cancer ([place]) 2010  . Diabetes mellitus without complication ([place])    type 2  . HLD (hyperlipidemia)   . Hypertension   . Mental disorder    depression; suicidal ideation in 2017  . Vitamin D deficiency     Past Surgical History:  Procedure Laterality Date  . BILATERAL OOPHORECTOMY    . CESAREAN SECTION     x 4  . CHOLECYSTECTOMY    . COLOSTOMY     x2  . SALPINGECTOMY    . TONSILLECTOMY AND ADENOIDECTOMY       Allergies  Allergen Reactions  . Other     Nickel staples     Current Outpatient Medications:  .  amLODipine (NORVASC) 5 MG tablet, Take 5 mg by mouth daily., Disp: , Rfl:  .  aspirin 81 MG chewable tablet, Chew 81 mg by mouth daily., Disp: , Rfl:  .  atorvastatin (LIPITOR) 40 MG tablet, Take 40 mg by mouth daily., Disp: , Rfl:  .  Cholecalciferol (VITAMIN D3) 1000 units CAPS, Take 1 capsule by mouth daily., Disp: , Rfl:  .  CRANBERRY EXTRACT PO, Take by mouth. gummies-daily, Disp: , Rfl:  .  DULoxetine (CYMBALTA) 20 MG capsule, Take 20 mg by mouth daily., Disp: , Rfl:  .  gabapentin (NEURONTIN) 100 MG capsule, Take 100 mg by mouth 3 (three) times daily., Disp: , Rfl:  .  glipiZIDE (GLUCOTROL) 5 MG tablet,  Take by mouth daily before breakfast., Disp: , Rfl:  .  insulin regular (NOVOLIN R,HUMULIN R) 100 units/mL injection, Inject 10-15 Units into the skin 2 (two) times daily before a meal. , Disp: , Rfl:  .  losartan (COZAAR) 25 MG tablet, , Disp: , Rfl:  .  metFORMIN (GLUCOPHAGE) 1000 MG tablet, Take 1,000 mg by mouth daily with breakfast., Disp: , Rfl:  .  methenamine (HIPREX) 1 g tablet, , Disp: , Rfl:  .  OMEGA-3-ACID ETH EST, DIETARY, PO, Take by mouth daily. , Disp: , Rfl:  .  polyethylene glycol powder (MIRALAX) powder, Take 1 Container by mouth as needed., Disp: , Rfl:  .  terbinafine (LAMISIL) 250 MG tablet, , Disp: , Rfl:  .  valACYclovir (VALTREX) 1000 MG tablet, Take 1,000 mg by mouth as needed. , Disp: , Rfl:    PHYSICAL EXAM SECTION: 1) BP (!) 121/58   Pulse 75   Ht 5\' 4"  (1.626 m)   Wt 155 lb (70.3 kg)   BMI 26.61 kg/m   Body mass index is 26.61 kg/m. General appearance: Well-developed well-nourished no gross deformities  2) Cardiovascular normal pulse and perfusion in all 4 extremities normal color without edema  3) Neurologically deep tendon reflexes are equal and normal, no sensation loss or deficits no pathologic reflexes  4) Psychological: Awake alert and oriented x3 mood and affect normal  5) Skin no lacerations or ulcerations no nodularity no palpable masses, no erythema or nodularity  6) Musculoskeletal:   Left shoulder no range of motion deficits normal strength no atrophy  Right shoulder she has full elevation up to 150 degrees Mild weakness No instability Mild tenderness  MEDICAL DECISION SECTION:  Encounter Diagnosis  Name Primary?  Marland Kitchen Nontraumatic complete tear of left rotator cuff Yes    Imaging Images noted below have seen the images and agree that tendon is torn with retraction without atrophy supraspinatus tendon tear  Plan:  (Rx., Inj., surg., Frx, MRI/CT, XR:2)  Discussed treatment options patient has good elevation now she does have  pain.  She wants to work in her garden. We discussed further treatment with home therapy I gave her the exercises to do  6-week follow-up 5:09 PM Arther Abbott, MD  12/27/2018 IMPRESSION: Supraspinatus high-grade partial tear with likely full-thickness components. Retraction of the torn fibers without substantial fatty muscle atrophy.  Infraspinatus moderate tendinosis.  Subscapularis mild tendinosis.  Fluid seen surrounding the long head of the biceps tendon in the bicipital groove which may represent normal extension of joint fluid or possibly biceps tenosynovitis in the appropriate clinical setting.  Mild AC joint degenerative changes.  Mild glenohumeral joint degenerative changes.

## 2019-01-05 ENCOUNTER — Telehealth: Payer: Self-pay

## 2019-01-05 NOTE — Telephone Encounter (Signed)
Patient called stating that there was some wrong information on her AVS. This is what she says is correct:  1. It her Right Rotator Cuff not Left 2.Januvia is not on her med list and it should be 3. Takes Extra Strength Excedrin not chewable aspirin 4. Takes Vit-D3 w/ Calcium 5. Takes Lovaza 2 tablets twice daily

## 2019-01-10 NOTE — Telephone Encounter (Signed)
I have updated but need to know her dose of Januvia

## 2019-01-10 NOTE — Telephone Encounter (Signed)
Called her left message.

## 2019-01-17 ENCOUNTER — Other Ambulatory Visit: Payer: Self-pay

## 2019-01-17 NOTE — Patient Outreach (Signed)
[facility]) Care Management  01/17/2019  Dorita Ringor 08/22/1945 [id number]   Medication Adherence call to Mrs. Nonda Lou Hippa Identifiers Verify spoke with patient she is past due on Atorvastatin 40 mg and Losartan 50 mg patient explain se takes both once daily patient ask if we can call [facility] an order both medication patient though they were on ready fill patient also ask for test strips and syringes [facility] will mail out with in 7 days,patient had other question from [facility] that I could not answer because I don have full access to [facility] patient was a bit upset I told patient she can call the 800 number she said I should be able to answer all her questions if I' am calling from [facility]. Mrs. Knickrehm is showing past due under [place].   [facility] Management Direct Dial [phone]  Fax [phone] [email or web address]

## 2019-02-07 ENCOUNTER — Ambulatory Visit: Payer: Medicare Other | Admitting: Orthopedic Surgery

## 2019-03-17 DIAGNOSIS — E785 Hyperlipidemia, unspecified: Secondary | ICD-10-CM | POA: Diagnosis not present

## 2019-03-17 DIAGNOSIS — Z7984 Long term (current) use of oral hypoglycemic drugs: Secondary | ICD-10-CM | POA: Diagnosis not present

## 2019-03-17 DIAGNOSIS — I10 Essential (primary) hypertension: Secondary | ICD-10-CM | POA: Diagnosis not present

## 2019-03-17 DIAGNOSIS — E559 Vitamin D deficiency, unspecified: Secondary | ICD-10-CM | POA: Diagnosis not present

## 2019-03-17 DIAGNOSIS — E1140 Type 2 diabetes mellitus with diabetic neuropathy, unspecified: Secondary | ICD-10-CM | POA: Diagnosis not present

## 2019-04-04 ENCOUNTER — Ambulatory Visit: Payer: Medicare Other | Admitting: Orthopedic Surgery

## 2019-06-16 DIAGNOSIS — I10 Essential (primary) hypertension: Secondary | ICD-10-CM | POA: Diagnosis not present

## 2019-06-16 DIAGNOSIS — E785 Hyperlipidemia, unspecified: Secondary | ICD-10-CM | POA: Diagnosis not present

## 2019-06-16 DIAGNOSIS — E1140 Type 2 diabetes mellitus with diabetic neuropathy, unspecified: Secondary | ICD-10-CM | POA: Diagnosis not present

## 2019-06-16 DIAGNOSIS — Z7984 Long term (current) use of oral hypoglycemic drugs: Secondary | ICD-10-CM | POA: Diagnosis not present

## 2019-06-16 DIAGNOSIS — Z794 Long term (current) use of insulin: Secondary | ICD-10-CM | POA: Diagnosis not present

## 2019-07-26 DIAGNOSIS — E1140 Type 2 diabetes mellitus with diabetic neuropathy, unspecified: Secondary | ICD-10-CM | POA: Diagnosis not present

## 2019-07-26 DIAGNOSIS — E785 Hyperlipidemia, unspecified: Secondary | ICD-10-CM | POA: Diagnosis not present

## 2019-07-26 DIAGNOSIS — I10 Essential (primary) hypertension: Secondary | ICD-10-CM | POA: Diagnosis not present

## 2019-07-26 DIAGNOSIS — E559 Vitamin D deficiency, unspecified: Secondary | ICD-10-CM | POA: Diagnosis not present

## 2019-08-11 DIAGNOSIS — Z794 Long term (current) use of insulin: Secondary | ICD-10-CM | POA: Diagnosis not present

## 2019-08-11 DIAGNOSIS — I10 Essential (primary) hypertension: Secondary | ICD-10-CM | POA: Diagnosis not present

## 2019-08-11 DIAGNOSIS — E785 Hyperlipidemia, unspecified: Secondary | ICD-10-CM | POA: Diagnosis not present

## 2019-08-11 DIAGNOSIS — Z7984 Long term (current) use of oral hypoglycemic drugs: Secondary | ICD-10-CM | POA: Diagnosis not present

## 2019-08-11 DIAGNOSIS — E1140 Type 2 diabetes mellitus with diabetic neuropathy, unspecified: Secondary | ICD-10-CM | POA: Diagnosis not present

## 2019-10-01 ENCOUNTER — Ambulatory Visit
Admission: EM | Admit: 2019-10-01 | Discharge: 2019-10-01 | Disposition: A | Discharge status: Home / Self Care | Payer: Medicare Other | Attending: Emergency Medicine | Admitting: Emergency Medicine

## 2019-10-01 ENCOUNTER — Other Ambulatory Visit: Payer: Self-pay

## 2019-10-01 ENCOUNTER — Ambulatory Visit (INDEPENDENT_AMBULATORY_CARE_PROVIDER_SITE_OTHER): Payer: Medicare Other

## 2019-10-01 DIAGNOSIS — S99921A Unspecified injury of right foot, initial encounter: Secondary | ICD-10-CM | POA: Insufficient documentation

## 2019-10-01 DIAGNOSIS — R030 Elevated blood-pressure reading, without diagnosis of hypertension: Secondary | ICD-10-CM

## 2019-10-01 DIAGNOSIS — M19071 Primary osteoarthritis, right ankle and foot: Secondary | ICD-10-CM | POA: Insufficient documentation

## 2019-10-01 DIAGNOSIS — L237 Allergic contact dermatitis due to plants, except food: Secondary | ICD-10-CM | POA: Diagnosis not present

## 2019-10-01 DIAGNOSIS — M79671 Pain in right foot: Secondary | ICD-10-CM

## 2019-10-01 DIAGNOSIS — N3001 Acute cystitis with hematuria: Secondary | ICD-10-CM | POA: Diagnosis not present

## 2019-10-01 LAB — POCT URINALYSIS DIP (MANUAL ENTRY)
Blood, UA: NEGATIVE
Glucose, UA: 250 mg/dL — AB
Nitrite, UA: POSITIVE — AB
Protein Ur, POC: >=300 mg/dL — AB
Spec Grav, UA: 1.01 (ref 1.010–1.025)
Urobilinogen, UA: 4 E.U./dL — AB
pH, UA: 5 (ref 5.0–8.0)

## 2019-10-01 MED ORDER — SULFAMETHOXAZOLE-TRIMETHOPRIM 800-160 MG PO TABS: 1.0000 | ORAL_TABLET | Freq: Two times a day (BID) | ORAL | 0 refills | Status: AC

## 2019-10-01 MED ORDER — DESONIDE 0.05 % EX OINT: 1.0000 "application " | TOPICAL_OINTMENT | Freq: Two times a day (BID) | CUTANEOUS | 0 refills | Status: DC

## 2019-10-01 MED ORDER — PREDNISONE 10 MG (21) PO TBPK
ORAL_TABLET | Freq: Every day | ORAL | 0 refills | Status: DC
Start: 2019-10-01 — End: 2019-11-23

## 2019-10-01 NOTE — ED Triage Notes (Signed)
Pt presents with c/o rash and right foot injury . Pt was gardening and fell and then developed rash . Pain is on top of foot

## 2019-10-01 NOTE — Discharge Instructions (Signed)
Foot injury:  X-rays negative for fracture or dislocation, x-rays showed mild osteoarthritis of great toe Continue conservative management of rest, ice, and gentle stretches Follow up with PCP as needed Return or go to the ER if you have any new or worsening symptoms (fever, chills, chest pain, abdominal pain, changes in bowel or bladder habits, pain radiating into lower legs, etc...)   Poison ivy:  Wash with warm water and mild soap Prednisone prescribed.  Take as directed and to completion Use OTC zyrtec, allegra, or claritin during the day.  Benadryl at night. You may also use OTC hydrocortisone cream and/or calamine lotion to help alleviate itching Follow up with PCP if symptoms persists  Return or go to the ED if you have any new or worsening symptoms such as fever, chills, nausea, vomiting, difficulty breathing, throat swelling, tongue swelling, numbness/ tingling in mouth, worsening symptoms despite treatment, etc..Marland Kitchen

## 2019-10-01 NOTE — ED Notes (Signed)
Pt states prior to discharge that she has also had dysuria and has been using AZO for past week

## 2019-10-01 NOTE — ED Provider Notes (Addendum)
[facility]   [id number] 10/01/19 Arrival Time: 0932  CC: RT foot pain and injury  SUBJECTIVE: History from: patient. Jessica Mclean is a 74 y.o. female complains of RT foot injury x 2 days.  Fall while walking down slanted drive-way, unsure of mechanism.  Localizes the pain to the top of foot.  Describes the pain as intermittent and sharp in character.  Has tried OTC medications without relief.  Symptoms are made worse with walking.  Denies similar symptoms in the past.  Complains of associated swelling.  Denies fever, chills, erythema, ecchymosis, weakness, numbness and tingling.  Also mentions poison ivy rash that is diffuse about the body x 1 week.  Denies alleviating or aggravating factors.  Reports previous poison ivy rash that improved with steroid cream and prednisone taper.    ROS: As per HPI.  All other pertinent ROS negative.     Past Medical History:  Diagnosis Date  . Cervical cancer ([place]) 2010  . Diabetes mellitus without complication ([place])    type 2  . HLD (hyperlipidemia)   . Hypertension   . Mental disorder    depression; suicidal ideation in 2017  . Vitamin D deficiency    Past Surgical History:  Procedure Laterality Date  . BILATERAL OOPHORECTOMY    . CESAREAN SECTION     x 4  . CHOLECYSTECTOMY    . COLOSTOMY     x2  . SALPINGECTOMY    . TONSILLECTOMY AND ADENOIDECTOMY     Allergies  Allergen Reactions  . Other     Nickel staples   No current facility-administered medications on file prior to encounter.   Current Outpatient Medications on File Prior to Encounter  Medication Sig Dispense Refill  . amLODipine (NORVASC) 5 MG tablet Take 5 mg by mouth daily.    . Aspirin-Acetaminophen-Caffeine (EXCEDRIN PO) Take by mouth.    Marland Kitchen atorvastatin (LIPITOR) 40 MG tablet Take 40 mg by mouth daily.    . calcium-vitamin D (OSCAL WITH D) 250-125 MG-UNIT tablet Take 1 tablet by mouth daily.    . Cholecalciferol (VITAMIN D3) 1000 units CAPS Take 1 capsule  by mouth daily.    Marland Kitchen CRANBERRY EXTRACT PO Take by mouth. gummies-daily    . DULoxetine (CYMBALTA) 20 MG capsule Take 20 mg by mouth daily.    Marland Kitchen gabapentin (NEURONTIN) 100 MG capsule Take 100 mg by mouth 3 (three) times daily.    . insulin regular (NOVOLIN R,HUMULIN R) 100 units/mL injection Inject 10-15 Units into the skin 2 (two) times daily before a meal.     . JANUVIA 50 MG tablet Take 50 mg by mouth daily.    Marland Kitchen losartan (COZAAR) 25 MG tablet     . metFORMIN (GLUCOPHAGE) 1000 MG tablet Take 1,000 mg by mouth daily with breakfast.    . methenamine (HIPREX) 1 g tablet     . omega-3 acid ethyl esters (LOVAZA) 1 g capsule Take by mouth 2 (two) times daily.    . OMEGA-3-ACID ETH EST, DIETARY, PO Take by mouth daily.     . polyethylene glycol powder (MIRALAX) powder Take 1 Container by mouth as needed.    . terbinafine (LAMISIL) 250 MG tablet     . valACYclovir (VALTREX) 1000 MG tablet Take 1,000 mg by mouth as needed.     . [DISCONTINUED] glipiZIDE (GLUCOTROL) 5 MG tablet Take by mouth daily before breakfast.     Social History   Socioeconomic History  . Marital status: Divorced  Spouse name: Not on file  . Number of children: Not on file  . Years of education: Not on file  . Highest education level: Not on file  Occupational History  . Not on file  Tobacco Use  . Smoking status: Never Smoker  . Smokeless tobacco: Never Used  Substance and Sexual Activity  . Alcohol use: No  . Drug use: No  . Sexual activity: Not Currently    Birth control/protection: Post-menopausal  Other Topics Concern  . Not on file  Social History Narrative  . Not on file   Social Determinants of Health   Financial Resource Strain:   . Difficulty of Paying Living Expenses:   Food Insecurity:   . Worried About Charity fundraiser in the Last Year:   . Arboriculturist in the Last Year:   Transportation Needs:   . Film/video editor (Medical):   Marland Kitchen Lack of Transportation (Non-Medical):   Physical  Activity:   . Days of Exercise per Week:   . Minutes of Exercise per Session:   Stress:   . Feeling of Stress :   Social Connections:   . Frequency of Communication with Friends and Family:   . Frequency of Social Gatherings with Friends and Family:   . Attends Religious Services:   . Active Member of Clubs or Organizations:   . Attends Archivist Meetings:   Marland Kitchen Marital Status:   Intimate Partner Violence:   . Fear of Current or Ex-Partner:   . Emotionally Abused:   Marland Kitchen Physically Abused:   . Sexually Abused:    Family History  Problem Relation Age of Onset  . Cancer Father        kidney  . Cancer Mother        lung  . Other Brother        heart issues    OBJECTIVE:  Vitals:   10/01/19 0952  BP: (!) 198/86  Pulse: (!) 56  Resp: 18  Temp: 98.5 F (36.9 C)  SpO2: 96%    General appearance: ALERT; in no acute distress.  Head: NCAT Lungs: Normal respiratory effort CV: Dorsalis pedis pulse 2+ Musculoskeletal: RT foot Inspection: Skin warm, dry, clear and intact without obvious erythema, effusion, or ecchymosis.  Palpation: TTP over 2nd -3rd distal MTs, and great toe ROM: FROM active and passive Strength: 5/5 dorsiflexion, 4+/5 plantar flexion Skin: warm and dry; small sparse areas of erythema and scabs to LLE, and bilateral forearms without surrounding erythema  Neurologic: Ambulates without difficulty; Sensation intact about the lower extremities Psychological: alert and cooperative; normal mood and affect  DIAGNOSTIC STUDIES:  DG Foot Complete Right  Result Date: 10/01/2019 CLINICAL DATA:  Dorsal right foot pain beginning after gardening. Pain greatest at the great toe. EXAM: RIGHT FOOT COMPLETE - 3+ VIEW COMPARISON:  None. FINDINGS: No fracture or acute finding.  No bone lesion. Mild asymmetric joint space narrowing at the first metatarsophalangeal joint. Small marginal spur from the dorsal medial aspect of the first metatarsal head with a small focus of  adjacent soft tissue calcification. Remaining joints are normally spaced and aligned. Soft tissues are unremarkable. IMPRESSION: 1. No fracture or acute finding. 2. 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 Electronically Signed   By: Lajean Manes M.D.   On: 10/01/2019 10:31    X-rays negative for bony abnormalities including fracture, or dislocation.  No soft tissue swelling.    I have reviewed the x-rays myself and the radiologist  interpretation. I am in agreement with the radiologist interpretation.     ASSESSMENT & PLAN:  1. Injury of right foot, initial encounter   2. Right foot pain   3. Arthritis of right foot   4. Poison ivy dermatitis   5. Elevated blood pressure reading     Meds ordered this encounter  Medications  . desonide (DESOWEN) 0.05 % ointment    Sig: Apply 1 application topically 2 (two) times daily.    Dispense:  15 g    Refill:  0    Order Specific Question:   Supervising Provider    Answer:   Raylene Everts [id number]  . predniSONE (STERAPRED UNI-PAK 21 TAB) 10 MG (21) TBPK tablet    Sig: Take by mouth daily. Take 6 tabs by mouth daily  for 2 days, then 5 tabs for 2 days, then 4 tabs for 2 days, then 3 tabs for 2 days, 2 tabs for 2 days, then 1 tab by mouth daily for 2 days    Dispense:  42 tablet    Refill:  0    Order Specific Question:   Supervising Provider    Answer:   Raylene Everts [id number]   Foot injury:  X-rays negative for fracture or dislocation, x-rays showed mild osteoarthritis of great toe Continue conservative management of rest, ice, and gentle stretches Follow up with PCP as needed Return or go to the ER if you have any new or worsening symptoms (fever, chills, chest pain, abdominal pain, changes in bowel or bladder habits, pain radiating into lower legs, etc...)   Poison ivy:  Wash with warm water and mild soap Prednisone prescribed.  Take as directed and to completion Use OTC zyrtec, allegra, or claritin during  the day.  Benadryl at night. You may also use OTC hydrocortisone cream and/or calamine lotion to help alleviate itching Follow up with PCP if symptoms persists  Return or go to the ED if you have any new or worsening symptoms such as fever, chills, nausea, vomiting, difficulty breathing, throat swelling, tongue swelling, numbness/ tingling in mouth, worsening symptoms despite treatment, etc...   Blood pressure elevated in office.  Please recheck in 24 hours.  If it continues to be greater than 140/90 please follow up with PCP for further evaluation and management.     Reviewed expectations re: course of current medical issues. Questions answered. Outlined signs and symptoms indicating need for more acute intervention. Patient verbalized understanding. After Visit Summary given.  Addendum:  Upon discharge patient mentions urinary symptoms.  Requests urine checked.  Urine showed signs of UTI.  Culture sent.  Antibiotic sent to pharmacy on file.      Lestine Box, PA-C 10/01/19 1051

## 2019-10-02 LAB — URINE CULTURE: Culture: <10000 — AB

## 2019-10-12 DIAGNOSIS — E1140 Type 2 diabetes mellitus with diabetic neuropathy, unspecified: Secondary | ICD-10-CM | POA: Diagnosis not present

## 2019-10-12 DIAGNOSIS — E785 Hyperlipidemia, unspecified: Secondary | ICD-10-CM | POA: Diagnosis not present

## 2019-10-12 DIAGNOSIS — Z7984 Long term (current) use of oral hypoglycemic drugs: Secondary | ICD-10-CM | POA: Diagnosis not present

## 2019-10-12 DIAGNOSIS — I10 Essential (primary) hypertension: Secondary | ICD-10-CM | POA: Diagnosis not present

## 2019-11-22 ENCOUNTER — Other Ambulatory Visit: Payer: Self-pay

## 2019-11-22 ENCOUNTER — Encounter (HOSPITAL_COMMUNITY): Payer: Self-pay | Admitting: Emergency Medicine

## 2019-11-22 ENCOUNTER — Observation Stay (HOSPITAL_COMMUNITY)
Admission: EM | Admit: 2019-11-22 | Discharge: 2019-11-23 | Disposition: A | Discharge status: Home / Self Care | Payer: Medicare Other | Attending: Family Medicine | Admitting: Family Medicine

## 2019-11-22 ENCOUNTER — Emergency Department (HOSPITAL_COMMUNITY): Payer: Medicare Other

## 2019-11-22 DIAGNOSIS — N390 Urinary tract infection, site not specified: Secondary | ICD-10-CM | POA: Insufficient documentation

## 2019-11-22 DIAGNOSIS — G459 Transient cerebral ischemic attack, unspecified: Secondary | ICD-10-CM | POA: Diagnosis not present

## 2019-11-22 DIAGNOSIS — Z20822 Contact with and (suspected) exposure to covid-19: Secondary | ICD-10-CM | POA: Insufficient documentation

## 2019-11-22 DIAGNOSIS — I10 Essential (primary) hypertension: Secondary | ICD-10-CM

## 2019-11-22 DIAGNOSIS — Z7982 Long term (current) use of aspirin: Secondary | ICD-10-CM | POA: Insufficient documentation

## 2019-11-22 DIAGNOSIS — E1165 Type 2 diabetes mellitus with hyperglycemia: Secondary | ICD-10-CM | POA: Diagnosis not present

## 2019-11-22 DIAGNOSIS — R2689 Other abnormalities of gait and mobility: Secondary | ICD-10-CM | POA: Insufficient documentation

## 2019-11-22 DIAGNOSIS — E1169 Type 2 diabetes mellitus with other specified complication: Secondary | ICD-10-CM

## 2019-11-22 DIAGNOSIS — Z8673 Personal history of transient ischemic attack (TIA), and cerebral infarction without residual deficits: Secondary | ICD-10-CM | POA: Diagnosis present

## 2019-11-22 DIAGNOSIS — Z8541 Personal history of malignant neoplasm of cervix uteri: Secondary | ICD-10-CM | POA: Diagnosis not present

## 2019-11-22 DIAGNOSIS — E785 Hyperlipidemia, unspecified: Secondary | ICD-10-CM | POA: Insufficient documentation

## 2019-11-22 DIAGNOSIS — R4781 Slurred speech: Secondary | ICD-10-CM | POA: Diagnosis present

## 2019-11-22 DIAGNOSIS — Z79899 Other long term (current) drug therapy: Secondary | ICD-10-CM | POA: Diagnosis not present

## 2019-11-22 DIAGNOSIS — R2681 Unsteadiness on feet: Secondary | ICD-10-CM | POA: Insufficient documentation

## 2019-11-22 DIAGNOSIS — Z794 Long term (current) use of insulin: Secondary | ICD-10-CM | POA: Insufficient documentation

## 2019-11-22 DIAGNOSIS — M6281 Muscle weakness (generalized): Secondary | ICD-10-CM | POA: Insufficient documentation

## 2019-11-22 LAB — I-STAT CHEM 8, ED
BUN: 22 mg/dL (ref 8–23)
Calcium, Ion: 1.31 mmol/L (ref 1.15–1.40)
Chloride: 104 mmol/L (ref 98–111)
Creatinine, Ser: 1 mg/dL (ref 0.44–1.00)
Glucose, Bld: 141 mg/dL — ABNORMAL HIGH (ref 70–99)
HCT: 33 % — ABNORMAL LOW (ref 36.0–46.0)
Hemoglobin: 11.2 g/dL — ABNORMAL LOW (ref 12.0–15.0)
Potassium: 3.9 mmol/L (ref 3.5–5.1)
Sodium: 141 mmol/L (ref 135–145)
TCO2: 24 mmol/L (ref 22–32)

## 2019-11-22 LAB — I-STAT CREATININE, ED: Creatinine, Ser: 1 mg/dL (ref 0.44–1.00)

## 2019-11-22 LAB — CBC WITH DIFFERENTIAL/PLATELET
Abs Immature Granulocytes: 0.02 10*3/uL (ref 0.00–0.07)
Basophils Absolute: 0.1 10*3/uL (ref 0.0–0.1)
Basophils Relative: 1 %
Eosinophils Absolute: 0.1 10*3/uL (ref 0.0–0.5)
Eosinophils Relative: 2 %
HCT: 33.3 % — ABNORMAL LOW (ref 36.0–46.0)
Hemoglobin: 10.1 g/dL — ABNORMAL LOW (ref 12.0–15.0)
Immature Granulocytes: 0 %
Lymphocytes Relative: 35 %
Lymphs Abs: 2.2 10*3/uL (ref 0.7–4.0)
MCH: 26.7 pg (ref 26.0–34.0)
MCHC: 30.3 g/dL (ref 30.0–36.0)
MCV: 88.1 fL (ref 80.0–100.0)
Monocytes Absolute: 0.7 10*3/uL (ref 0.1–1.0)
Monocytes Relative: 11 %
Neutro Abs: 3.3 10*3/uL (ref 1.7–7.7)
Neutrophils Relative %: 51 %
Platelets: 325 10*3/uL (ref 150–400)
RBC: 3.78 MIL/uL — ABNORMAL LOW (ref 3.87–5.11)
RDW: 16.7 % — ABNORMAL HIGH (ref 11.5–15.5)
WBC: 6.4 10*3/uL (ref 4.0–10.5)
nRBC: 0 % (ref 0.0–0.2)

## 2019-11-22 LAB — COMPREHENSIVE METABOLIC PANEL
ALT: 16 U/L (ref 0–44)
AST: 18 U/L (ref 15–41)
Albumin: 4.4 g/dL (ref 3.5–5.0)
Alkaline Phosphatase: 73 U/L (ref 38–126)
Anion gap: 12 (ref 5–15)
BUN: 22 mg/dL (ref 8–23)
CO2: 24 mmol/L (ref 22–32)
Calcium: 9.4 mg/dL (ref 8.9–10.3)
Chloride: 103 mmol/L (ref 98–111)
Creatinine, Ser: 0.95 mg/dL (ref 0.44–1.00)
GFR calc Af Amer: >60 mL/min (ref >60)
GFR calc non Af Amer: 59 mL/min — ABNORMAL LOW (ref >60)
Glucose, Bld: 144 mg/dL — ABNORMAL HIGH (ref 70–99)
Potassium: 3.9 mmol/L (ref 3.5–5.1)
Sodium: 139 mmol/L (ref 135–145)
Total Bilirubin: 0.3 mg/dL (ref 0.3–1.2)
Total Protein: 7.8 g/dL (ref 6.5–8.1)

## 2019-11-22 LAB — PROTIME-INR
INR: 0.9 (ref 0.8–1.2)
Prothrombin Time: 11.9 seconds (ref 11.4–15.2)

## 2019-11-22 LAB — GLUCOSE, CAPILLARY: Glucose-Capillary: 144 mg/dL — ABNORMAL HIGH (ref 70–99)

## 2019-11-22 LAB — APTT: aPTT: 25 seconds (ref 24–36)

## 2019-11-22 LAB — ETHANOL: Alcohol, Ethyl (B): <10 mg/dL (ref <10)

## 2019-11-22 LAB — CBG MONITORING, ED: Glucose-Capillary: 155 mg/dL — ABNORMAL HIGH (ref 70–99)

## 2019-11-22 LAB — SARS CORONAVIRUS 2 BY RT PCR (HOSPITAL ORDER, PERFORMED IN ~~LOC~~ HOSPITAL LAB): SARS Coronavirus 2: NEGATIVE

## 2019-11-22 NOTE — ED Triage Notes (Signed)
Patient c/o of stroke like symptoms with aphasia that began at 56.

## 2019-11-22 NOTE — Consult Note (Signed)
[facility] Tele[facility]urology Consult Services   Date of Service:   11/22/2019 19:54:10  Impression:     .  G45.9 - Transient cerebral ischemic attack, unspecified  Comments/Sign-Out: 74 year old female with a past medical history of DM, HLD, HTN presents with transient symptoms of speech difficulties that started at around 6:30 PM. DDX admit for TIA work up.  Metrics: Last Known Well: 11/22/2019 18:30:00 [facility] Notification Time: 11/22/2019 19:53:39 Arrival Time: 11/22/2019 19:42:00 Stamp Time: 11/22/2019 19:54:10 Time First Login Attempt: 11/22/2019 20:00:06 Symptoms: Aphasia NIHSS Start Assessment Time: 11/22/2019 20:07:00 Patient is not a candidate for Alteplase/Activase. Alteplase Medical Decision: 11/22/2019 20:10:00 Patient was not deemed candidate for Alteplase/Activase thrombolytics because of following reasons: Resolved symptoms (no residual disabling symptoms).  CT head showed no acute hemorrhage or acute core infarct.  ED Physician notified of diagnostic impression and management plan on 11/22/2019 20:30:15  Advanced Imaging: Advanced Imaging Not Recommended because:  Clinical Presentation is not Suggestive of LVO and NIHSS is <6   Alteplase/Activase Contraindications:  Ischemic stroke within 3 months: No Severe head trauma within 3 months: No Intracranial/intraspinal surgery within 3 months: No History of intracranial hemorrhage: No Symptoms and signs consistent with an SAH: No GI malignancy or GI bleed within 21 days: No Treatment dose of LMWH within the previous 24 hrs: No Use of NOACs in past 48 hours: No Glycoprotein IIb/IIIa receptor inhibitors use: No Symptoms consistent with infective endocarditis: No Suspected aortic arch dissection: No Intra-axial intracranial neoplasm: No  Our recommendations are outlined below.  Recommendations:     .  Activate Stroke Protocol Admission/Order Set     .  Stroke/Telemetry Floor      .  Neuro Checks     .  Bedside Swallow Eval     .  DVT Prophylaxis     .  IV Fluids, Normal Saline     .  Head of Bed 30 Degrees     .  Euglycemia and Avoid Hyperthermia (PRN Acetaminophen)     .  Antiplatelet Therapy Recommended   Sign Out:     .  Discussed with Emergency Department Provider    ------------------------------------------------------------------------------  History of Present Illness: Patient is a 74 year old Female.  Patient was brought by private transportation with symptoms of Aphasia  74 year old female with a past medical history of DM, HLD, HTN presents with transient symptoms of speech difficulties that started at around 6:30 PM. The patient states that she was not able to talk well. She is back to baseline right now. NIHHS is zero. Her daughter who is at bedside states that her speech is back to baseline. Patient was not deemed to be a IV alteplase candidate secondary to resolved symptoms with no focal residual deficits.  Last seen normal was within 4.5 hours.  Past Medical History:     . Hypertension     . Diabetes Mellitus     . Hyperlipidemia     . There is NO history of Atrial Fibrillation     . There is NO history of Coronary Artery Disease     . There is NO history of Stroke      Examination: BP(168/103), Pulse(66), Blood Glucose(141) 1A: Level of Consciousness - Alert; keenly responsive + 0 1B: Ask Month and Age - Both Questions Right + 0 1C: Blink Eyes & Squeeze Hands - Performs Both Tasks + 0 2: Test Horizontal Extraocular Movements - Normal + 0 3: Test Visual Fields - No Visual  Loss + 0 4: Test Facial Palsy (Use Grimace if Obtunded) - Normal symmetry + 0 5A: Test Left Arm Motor Drift - No Drift for 10 Seconds + 0 5B: Test Right Arm Motor Drift - No Drift for 10 Seconds + 0 6A: Test Left Leg Motor Drift - No Drift for 5 Seconds + 0 6B: Test Right Leg Motor Drift - No Drift for 5 Seconds + 0 7: Test Limb Ataxia (FNF/Heel-Shin) - No  Ataxia + 0 8: Test Sensation - Normal; No sensory loss + 0 9: Test Language/Aphasia - Normal; No aphasia + 0 10: Test Dysarthria - Normal + 0 11: Test Extinction/Inattention - No abnormality + 0  NIHSS Score: 0  Pre-Morbid Modified Ranking Scale: 0 Points = No symptoms at all   Patient/Family was informed the Neurology Consult would occur via TeleHealth consult by way of interactive audio and video telecommunications and consented to receiving care in this manner.   Patient is being evaluated for possible acute neurologic impairment and high probability of imminent or life-threatening deterioration. I spent total of 30 minutes providing care to this patient, including time for face to face visit via telemedicine, review of medical records, imaging studies and discussion of findings with providers, the patient and/or family.   Dr Jessica Priest   [facility] [phone]  Case [id number]

## 2019-11-22 NOTE — Progress Notes (Signed)
Code Stroke Time Documentation   1950 Call Time [place] [facility] sent to [facility] 2003 Exam completed in [place] radiology called

## 2019-11-22 NOTE — ED Notes (Signed)
Jessica Mclean cleared for CT at [place].

## 2019-11-22 NOTE — ED Provider Notes (Signed)
[facility] EMERGENCY DEPARTMENT Provider Note   CSN: [id number] Arrival date & time: 11/22/19  1942     History Chief Complaint  Patient presents with  . possible stroke    Jessica Mclean is a 74 y.o. female.  She has a history of diabetes hypertension.  She is complaining of some garbled speech that started about an hour prior to arrival.  It seems to come and go.  No prior history of same.  Denies any blurry vision double vision focal weakness or numbness.  She said she thought she might pass out trying to walk in.  The history is provided by the patient.  Cerebrovascular Accident This is a new problem. The current episode started 1 to 2 hours ago. Progression since onset: comes and goes. Pertinent negatives include no chest pain, no abdominal pain, no headaches and no shortness of breath. Nothing aggravates the symptoms. Nothing relieves the symptoms. She has tried nothing for the symptoms. The treatment provided no relief.       Past Medical History:  Diagnosis Date  . Cervical cancer ([place]) 2010  . Diabetes mellitus without complication ([place])    type 2  . HLD (hyperlipidemia)   . Hypertension   . Mental disorder    depression; suicidal ideation in 2017  . Vitamin D deficiency     Patient Active Problem List   Diagnosis Date Noted  . Diabetes mellitus without complication ([place]) 42/70/6237    Past Surgical History:  Procedure Laterality Date  . BILATERAL OOPHORECTOMY    . CESAREAN SECTION     x 4  . CHOLECYSTECTOMY    . COLOSTOMY     x2  . SALPINGECTOMY    . TONSILLECTOMY AND ADENOIDECTOMY       OB History    Gravida  5   Para      Term      Preterm      AB      Living  4     SAB      TAB      Ectopic      Multiple      Live Births              Family History  Problem Relation Age of Onset  . Cancer Father        kidney  . Cancer Mother        lung  . Other Brother        heart issues    Social History   Tobacco Use  .  Smoking status: Never Smoker  . Smokeless tobacco: Never Used  Substance Use Topics  . Alcohol use: No  . Drug use: No    Home Medications Prior to Admission medications   Medication Sig Start Date End Date Taking? Authorizing Provider  amLODipine (NORVASC) 5 MG tablet Take 5 mg by mouth daily.    [provider]  Aspirin-Acetaminophen-Caffeine (EXCEDRIN PO) Take by mouth.    [provider]  atorvastatin (LIPITOR) 40 MG tablet Take 40 mg by mouth daily.    [provider]  calcium-vitamin D (OSCAL WITH D) 250-125 MG-UNIT tablet Take 1 tablet by mouth daily.    [provider]  Cholecalciferol (VITAMIN D3) 1000 units CAPS Take 1 capsule by mouth daily.    [provider]  CRANBERRY EXTRACT PO Take by mouth. gummies-daily    [provider]  desonide (DESOWEN) 0.05 % ointment Apply 1 application topically 2 (two) times daily.  10/01/19   Wurst, [place], PA-C  DULoxetine (CYMBALTA) 20 MG capsule Take 20 mg by mouth daily.    [provider]  gabapentin (NEURONTIN) 100 MG capsule Take 100 mg by mouth 3 (three) times daily.    [provider]  insulin regular (NOVOLIN R,HUMULIN R) 100 units/mL injection Inject 10-15 Units into the skin 2 (two) times daily before a meal.     [provider]  JANUVIA 50 MG tablet Take 50 mg by mouth daily. 09/01/19   [provider]  losartan (COZAAR) 25 MG tablet  01/19/18   [provider]  metFORMIN (GLUCOPHAGE) 1000 MG tablet Take 1,000 mg by mouth daily with breakfast.    [provider]  methenamine (HIPREX) 1 g tablet  01/19/18   [provider]  omega-3 acid ethyl esters (LOVAZA) 1 g capsule Take by mouth 2 (two) times daily.    [provider]  OMEGA-3-ACID ETH EST, DIETARY, PO Take by mouth daily.     [provider]  polyethylene glycol powder (MIRALAX) powder Take 1 Container by mouth as needed.    [provider]    predniSONE (STERAPRED UNI-PAK 21 TAB) 10 MG (21) TBPK tablet Take by mouth daily. Take 6 tabs by mouth daily  for 2 days, then 5 tabs for 2 days, then 4 tabs for 2 days, then 3 tabs for 2 days, 2 tabs for 2 days, then 1 tab by mouth daily for 2 days 10/01/19   Stacey Drain, [place], PA-C  terbinafine (LAMISIL) 250 MG tablet  01/12/18   [provider]  valACYclovir (VALTREX) 1000 MG tablet Take 1,000 mg by mouth as needed.     [provider]  glipiZIDE (GLUCOTROL) 5 MG tablet Take by mouth daily before breakfast.  10/01/19  [provider]    Allergies    Other  Review of Systems   Review of Systems  Constitutional: Negative for fever.  HENT: Negative for sore throat.   Eyes: Negative for visual disturbance.  Respiratory: Negative for shortness of breath.   Cardiovascular: Negative for chest pain.  Gastrointestinal: Negative for abdominal pain.  Genitourinary: Negative for dysuria.  Musculoskeletal: Negative for neck pain.  Skin: Negative for rash.  Neurological: Positive for speech difficulty. Negative for weakness, numbness and headaches.    Physical Exam Updated Vital Signs BP (!) 168/103 (BP Location: Right Arm)   Pulse 66   Temp (!) 97.4 F (36.3 C) (Oral)   Resp 17   Ht 5\' 4"  (1.626 m)   Wt 70.3 kg   SpO2 98%   BMI 26.61 kg/m   Physical Exam Vitals and nursing note reviewed.  Constitutional:      General: She is not in acute distress.    Appearance: She is well-developed.  HENT:     Head: Normocephalic and atraumatic.  Eyes:     Conjunctiva/sclera: Conjunctivae normal.  Cardiovascular:     Rate and Rhythm: Normal rate and regular rhythm.     Heart sounds: No murmur heard.   Pulmonary:     Effort: Pulmonary effort is normal. No respiratory distress.     Breath sounds: Normal breath sounds.  Abdominal:     Palpations: Abdomen is soft.     Tenderness: There is no abdominal tenderness.  Musculoskeletal:     Cervical back: Neck supple.   Skin:    General: Skin is warm and dry.     Capillary Refill: Capillary refill takes less than 2 seconds.  Neurological:  Mental Status: She is alert.     Cranial Nerves: Cranial nerve deficit present.     Sensory: No sensory deficit.     Motor: No weakness.     Comments: Cranial nerves with maybe trace facial asymmetry on the right.  Speech is fluent but sometimes gets a little tongue-tied.  Upper and lower extremity strength is 5 out of 5.  No pronator drift.     ED Results / Procedures / Treatments   Labs (all labs ordered are listed, but only abnormal results are displayed) Labs Reviewed  COMPREHENSIVE METABOLIC PANEL - Abnormal; Notable for the following components:      Result Value   Glucose, Bld 144 (*)    GFR calc non Af Amer 59 (*)    All other components within normal limits  CBC WITH DIFFERENTIAL/PLATELET - Abnormal; Notable for the following components:   RBC 3.78 (*)    Hemoglobin 10.1 (*)    HCT 33.3 (*)    RDW 16.7 (*)    All other components within normal limits  CK TOTAL AND CKMB (NOT AT [facility]) - Abnormal; Notable for the following components:   Relative Index 2.8 (*)    All other components within normal limits  URINALYSIS, ROUTINE W REFLEX MICROSCOPIC - Abnormal; Notable for the following components:   Protein, ur 30 (*)    All other components within normal limits  COMPREHENSIVE METABOLIC PANEL - Abnormal; Notable for the following components:   Glucose, Bld 146 (*)    All other components within normal limits  CBC - Abnormal; Notable for the following components:   RBC 3.51 (*)    Hemoglobin 9.3 (*)    HCT 30.8 (*)    RDW 16.3 (*)    All other components within normal limits  GLUCOSE, CAPILLARY - Abnormal; Notable for the following components:   Glucose-Capillary 144 (*)    All other components within normal limits  HEMOGLOBIN A1C - Abnormal; Notable for the following components:   Hgb A1c MFr Bld 7.8 (*)    All other components within normal  limits  GLUCOSE, CAPILLARY - Abnormal; Notable for the following components:   Glucose-Capillary 130 (*)    All other components within normal limits  I-STAT CHEM 8, ED - Abnormal; Notable for the following components:   Glucose, Bld 141 (*)    Hemoglobin 11.2 (*)    HCT 33.0 (*)    All other components within normal limits  CBG MONITORING, ED - Abnormal; Notable for the following components:   Glucose-Capillary 155 (*)    All other components within normal limits  SARS CORONAVIRUS 2 BY RT PCR (HOSPITAL ORDER, [facility])  URINE CULTURE  ETHANOL  PROTIME-INR  APTT  RAPID URINE DRUG SCREEN, HOSP PERFORMED  PROTIME-INR  APTT  CBC  DIFFERENTIAL  I-STAT CREATININE, ED    EKG None  Radiology MR BRAIN WO CONTRAST  Result Date: 11/23/2019 CLINICAL DATA:  Neuro deficit, acute, stroke suspected. EXAM: MRI HEAD WITHOUT CONTRAST TECHNIQUE: Multiplanar, multiecho pulse sequences of the brain and surrounding structures were obtained without intravenous contrast. COMPARISON:  Noncontrast head CT 11/22/2019. FINDINGS: Brain: There is a punctate focus of diffusion weighted hyperintensity within the left pons on the axial diffusion-weighted sequence (series 3, image 14). There is no definite ADC correlate and this finding is not confirmed on the coronal diffusion-weighted imaging. This may reflect a small punctate acute infarct versus artifact. Minimal scattered T2/FLAIR hyperintensity within the cerebral white matter  is nonspecific, but consistent with chronic small vessel ischemic disease. There is a 2 mm extra-axial focus of T2/FLAIR hyperintensity and restricted diffusion overlying the left parietal lobe. (Series 3, image 30) (series 4, image 31) (series 8, image 27). This finding is nonspecific but may reflect a tiny meningioma. There is no acute infarct. No chronic intracranial blood products. No extra-axial fluid collection. No midline shift. Partially empty sella  turcica. Vascular: Expected proximal arterial flow voids. Skull and upper cervical spine: No focal marrow lesion. Sinuses/Orbits: Visualized orbits show no acute finding. Minimal ethmoid sinus mucosal thickening. No significant mastoid effusion. IMPRESSION: Punctate acute infarct versus artifact within the paramedian left pons as described. No evidence of acute infarct elsewhere within the brain. 2 mm extra-axial focus of FLAIR hyperintensity and restricted diffusion overlying the left parietal lobe. This finding is nonspecific but may reflect a tiny incidental meningioma. Mild generalized parenchymal atrophy and chronic small vessel ischemic disease. Minimal ethmoid sinus mucosal thickening. Electronically Signed   By: Kellie Simmering DO   On: 11/23/2019 09:21   US Carotid Bilateral  Result Date: 11/23/2019 CLINICAL DATA:  Hypertension, TIA symptoms, aphasia, concern for stroke EXAM: BILATERAL CAROTID DUPLEX ULTRASOUND TECHNIQUE: Pearline Cables scale imaging, color Doppler and duplex ultrasound were performed of bilateral carotid and vertebral arteries in the neck. COMPARISON:  None. FINDINGS: Criteria: Quantification of carotid stenosis is based on velocity parameters that correlate the residual internal carotid diameter with NASCET-based stenosis levels, using the diameter of the distal internal carotid lumen as the denominator for stenosis measurement. The following velocity measurements were obtained: RIGHT ICA: 67/21 cm/sec CCA: 75/44 cm/sec SYSTOLIC ICA/CCA RATIO:  0.9 ECA: 64 cm/sec LEFT ICA: 84/25 cm/sec CCA: 92/01 cm/sec SYSTOLIC ICA/CCA RATIO:  0.9 ECA: 70 cm/sec RIGHT CAROTID ARTERY: Minor echogenic shadowing plaque formation. No hemodynamically significant right ICA stenosis, velocity elevation, or turbulent flow. Degree of narrowing less than 50%. RIGHT VERTEBRAL ARTERY:  Normal antegrade flow LEFT CAROTID ARTERY: Similar scattered minor echogenic plaque formation. No hemodynamically significant left ICA  stenosis, velocity elevation, or turbulent flow. LEFT VERTEBRAL ARTERY:  Normal antegrade flow IMPRESSION: Minor carotid atherosclerosis. No hemodynamically significant ICA stenosis. Degree of narrowing less than 50% bilaterally by ultrasound criteria. Patent antegrade vertebral flow bilaterally Electronically Signed   By: Jerilynn Mages.  Shick M.D.   On: 11/23/2019 09:51   CT HEAD CODE STROKE WO CONTRAST  Result Date: 11/22/2019 CLINICAL DATA:  Code stroke.  Aphasia.  Last seen normal 1750 hours. EXAM: CT HEAD WITHOUT CONTRAST TECHNIQUE: Contiguous axial images were obtained from the base of the skull through the vertex without intravenous contrast. COMPARISON:  None. FINDINGS: Brain: Age related volume loss. No sign of old or acute focal infarction, mass lesion, hemorrhage, hydrocephalus or extra-axial collection. Vascular: There is atherosclerotic calcification of the major vessels at the base of the brain. Skull: Negative Sinuses/Orbits: Clear/normal Other: None ASPECTS ([place] Stroke Program Early CT Score) - Ganglionic level infarction (caudate, lentiform nuclei, internal capsule, insula, M1-M3 cortex): 7 - Supraganglionic infarction (M4-M6 cortex): 3 Total score (0-10 with 10 being normal): 10 IMPRESSION: 1. No acute or focal finding.  Normal for age. 2. ASPECTS is 10. 3. These results were called by telephone at the time of interpretation on 11/22/2019 at 8:06 pm to provider [facility] , who verbally acknowledged these results. Electronically Signed   By: Nelson Chimes M.D.   On: 11/22/2019 20:06    Procedures Procedures (including critical care time)  Medications Ordered in ED Medications  cefTRIAXone (ROCEPHIN) 1  g in sodium chloride 0.9 % 100 mL IVPB (1 g Intravenous New Bag/Given 11/23/19 0032)  insulin aspart (novoLOG) injection 0-15 Units (2 Units Subcutaneous Given 11/23/19 0804)  insulin aspart (novoLOG) injection 0-5 Units (0 Units Subcutaneous Not Given 11/23/19 0145)  atorvastatin (LIPITOR) tablet  40 mg (40 mg Oral Given 11/23/19 1020)  omega-3 acid ethyl esters (LOVAZA) capsule 2 g (2 g Oral Given 11/23/19 1020)    ED Course  I have reviewed the triage vital signs and the nursing notes.  Pertinent labs & imaging results that were available during my care of the patient were reviewed by me and considered in my medical decision making (see chart for details).  Clinical Course as of Nov 22 1040  Tue Nov 22, 2019  2015 Received a call from radiology that the patient's head CT does not show any acute findings.   [MB]  2020 ECG is normal sinus rhythm normal intervals no acute ST-T changes rate of 66.   [MB]  2038 Received call back from teleneurology.  He does not feel the patient has any current deficits.  Recommends admission for TIA work-up.  He states if the patient has any recurrence of symptoms that the clock would start then and that the patient would need a CTA head and neck.   [MB]  2120 Discussed with [facility] hospitalist Dr. Josephine Cables who will evaluate the patient for admission.   [MB]    Clinical Course User Index [MB] Hayden Rasmussen, MD   MDM Rules/Calculators/A&P                         This patient complains of garbled speech; this involves an extensive number of treatment Options and is a complaint that carries with it a high risk of complications and Morbidity. The differential includes stroke, TIA, hypoglycemia, metabolic derangement, anemia  I ordered, reviewed and interpreted labs, which included CBC with normal white count, slightly lower hemoglobin than baseline, normal platelets, chemistries normal other than elevated glucose, normal INR I ordered imaging studies which included CT head and I independently    visualized and interpreted imaging which showed no acute findings Additional history obtained from patient's daughter Previous records obtained and reviewed in epic, no significant neurologic encounters I consulted teleneurology for stroke evaluation and [facility]  hospitalist Dr. Josephine Cables and discussed lab and imaging findings  Critical Interventions: None  After the interventions stated above, I reevaluated the patient and found to be neurologically intact currently. She will need admission to the hospital for TIA work-up. She is agreeable to plan.   Final Clinical Impression(s) / ED Diagnoses Final diagnoses:  TIA (transient ischemic attack)    Rx / DC Orders ED Discharge Orders    None       Hayden Rasmussen, MD 11/23/19 1047

## 2019-11-22 NOTE — ED Notes (Signed)
[place] paged @ [phone]

## 2019-11-22 NOTE — H&P (Signed)
History and Physical  [name]:[id number] DOB: 05/14/1946 DOA: 11/22/2019  Referring physician: Aletta Edouard MD PCP: Jani Gravel, MD  Patient coming from:  Home  Chief Complaint: Garbled speech  HPI: Jessica Mclean is a 74 y.o. female with medical history significant for T2DM, HLD and hypertension who presents to the ED due to sudden onset of garbled speech with difficulty in getting words out that started about 6:30PM, she has since been back to baseline and this was confirmed by daughter at bedside. She denies previous episode of this, but she also complains of burning sensation on urination which has been been going for few days. patient denies fever, chills, CP, SOB, diarrhea or constipation.  ED Course:  In the ED, Initial temp was 97.74F, but this increased to 98.33F with blankets, BP was 168/103. Hyperglycemia and normocytic anemia was noted. Sars coronavirus was negative. CT head without contrast showed  no acute or focal finding. Tele-neurologist was consulted and recommended admitting pt. for TIA work up.  Review of Systems: Constitutional: Negative for chills and fever.  HENT: Negative for ear pain and sore throat.   Eyes: Negative for pain and visual disturbance.  Respiratory: Negative for cough, chest tightness and shortness of breath.   Cardiovascular: Negative for chest pain and palpitations.  Gastrointestinal: Negative for abdominal pain and vomiting.  Endocrine: Negative for polyphagia and polyuria.  Genitourinary: Positive for burning sensation on urination. Negative for decreased urine volume, dysuria, enuresis Musculoskeletal: Negative for arthralgias and back pain.  Skin: Negative for color change and rash.  Allergic/Immunologic: Negative for immunocompromised state.  Neurological: Positive for speech difficulty.  Negative for tremors, syncope,  weakness, light-headedness and headaches.  Hematological: Does not bruise/bleed easily.  All other systems reviewed  and are negative   Past Medical History:  Diagnosis Date  . Cervical cancer ([facility]) 2010  . Diabetes mellitus without complication ([facility])    type 2  . HLD (hyperlipidemia)   . Hypertension   . Mental disorder    depression; suicidal ideation in 2017  . Vitamin D deficiency    Past Surgical History:  Procedure Laterality Date  . BILATERAL OOPHORECTOMY    . CESAREAN SECTION     x 4  . CHOLECYSTECTOMY    . COLOSTOMY     x2  . SALPINGECTOMY    . TONSILLECTOMY AND ADENOIDECTOMY      Social History:  reports that she has never smoked. She has never used smokeless tobacco. She reports that she does not drink alcohol and does not use drugs.   Allergies  Allergen Reactions  . Other     Nickel staples    Family History  Problem Relation Age of Onset  . Cancer Father        kidney  . Cancer Mother        lung  . Other Brother        heart issues    Prior to Admission medications   Medication Sig Start Date End Date Taking? Authorizing Provider  amLODipine (NORVASC) 5 MG tablet Take 5 mg by mouth daily.    [provider]  Aspirin-Acetaminophen-Caffeine (EXCEDRIN PO) Take by mouth.    [provider]  atorvastatin (LIPITOR) 40 MG tablet Take 40 mg by mouth daily.    [provider]  calcium-vitamin D (OSCAL WITH D) 250-125 MG-UNIT tablet Take 1 tablet by mouth daily.    [provider]  Cholecalciferol (VITAMIN D3) 1000 units CAPS Take 1 capsule by mouth daily.  [provider]  CRANBERRY EXTRACT PO Take by mouth. gummies-daily    [provider]  desonide (DESOWEN) 0.05 % ointment Apply 1 application topically 2 (two) times daily. 10/01/19   Wurst, [place], PA-C  DULoxetine (CYMBALTA) 20 MG capsule Take 20 mg by mouth daily.    [provider]  gabapentin (NEURONTIN) 100 MG capsule Take 100 mg by mouth 3 (three) times daily.    [provider]  insulin regular (NOVOLIN R,HUMULIN R) 100 units/mL  injection Inject 10-15 Units into the skin 2 (two) times daily before a meal.     [provider]  JANUVIA 50 MG tablet Take 50 mg by mouth daily. 09/01/19   [provider]  losartan (COZAAR) 25 MG tablet  01/19/18   [provider]  metFORMIN (GLUCOPHAGE) 1000 MG tablet Take 1,000 mg by mouth daily with breakfast.    [provider]  methenamine (HIPREX) 1 g tablet  01/19/18   [provider]  omega-3 acid ethyl esters (LOVAZA) 1 g capsule Take by mouth 2 (two) times daily.    [provider]  OMEGA-3-ACID ETH EST, DIETARY, PO Take by mouth daily.     [provider]  polyethylene glycol powder (MIRALAX) powder Take 1 Container by mouth as needed.    [provider]  predniSONE (STERAPRED UNI-PAK 21 TAB) 10 MG (21) TBPK tablet Take by mouth daily. Take 6 tabs by mouth daily  for 2 days, then 5 tabs for 2 days, then 4 tabs for 2 days, then 3 tabs for 2 days, 2 tabs for 2 days, then 1 tab by mouth daily for 2 days 10/01/19   Stacey Drain, [place], PA-C  terbinafine (LAMISIL) 250 MG tablet  01/12/18   [provider]  valACYclovir (VALTREX) 1000 MG tablet Take 1,000 mg by mouth as needed.     [provider]  glipiZIDE (GLUCOTROL) 5 MG tablet Take by mouth daily before breakfast.  10/01/19  [provider]    Physical Exam: BP (!) 151/62 (BP Location: Right Arm)   Pulse 60   Temp 98.5 F (36.9 C) (Oral)   Resp 16   Ht 5\' 4"  (1.626 m)   Wt 70.3 kg   SpO2 99%   BMI 26.61 kg/m   . General: 73 y.o. year-old female well developed well nourished in no acute distress.  Alert and oriented x3. . Cardiovascular: Regular rate and rhythm with no rubs or gallops.  No thyromegaly or JVD noted.  No lower extremity edema. 2/4 pulses in all 4 extremities. Marland Kitchen Respiratory: Clear to auscultation with no wheezes or rales. Good inspiratory effort. . Abdomen: Soft nontender nondistended with normal bowel sounds x4  quadrants. . Muskuloskeletal: No cyanosis, clubbing or edema noted bilaterally . Neuro: CN II-XII intact, strength, sensation, reflexes . Skin: No ulcerative lesions noted or rashes . Psychiatry: Judgement and insight appear normal. Mood is appropriate for condition and setting          Labs on Admission:  Basic Metabolic Panel: Recent Labs  Lab 11/22/19 2005 11/22/19 2019 11/22/19 2022  NA 139 141  --   K 3.9 3.9  --   CL 103 104  --   CO2 24  --   --   GLUCOSE 144* 141*  --   BUN 22 22  --   CREATININE 0.95 1.00 1.00  CALCIUM 9.4  --   --    Liver Function Tests: Recent Labs  Lab 11/22/19 2005  AST  18  ALT 16  ALKPHOS 73  BILITOT 0.3  PROT 7.8  ALBUMIN 4.4   No results for input(s): LIPASE, AMYLASE in the last 168 hours. No results for input(s): AMMONIA in the last 168 hours. CBC: Recent Labs  Lab 11/22/19 2005 11/22/19 2019  WBC 6.4  --   NEUTROABS 3.3  --   HGB 10.1* 11.2*  HCT 33.3* 33.0*  MCV 88.1  --   PLT 325  --    Cardiac Enzymes: Recent Labs  Lab 11/22/19 2005  CKTOTAL 138  CKMB PENDING    BNP (last 3 results) No results for input(s): BNP in the last 8760 hours.  ProBNP (last 3 results) No results for input(s): PROBNP in the last 8760 hours.  CBG: Recent Labs  Lab 11/22/19 2008 11/22/19 2328  GLUCAP 155* 144*    Radiological Exams on Admission: CT HEAD CODE STROKE WO CONTRAST  Result Date: 11/22/2019 CLINICAL DATA:  Code stroke.  Aphasia.  Last seen normal 1750 hours. EXAM: CT HEAD WITHOUT CONTRAST TECHNIQUE: Contiguous axial images were obtained from the base of the skull through the vertex without intravenous contrast. COMPARISON:  None. FINDINGS: Brain: Age related volume loss. No sign of old or acute focal infarction, mass lesion, hemorrhage, hydrocephalus or extra-axial collection. Vascular: There is atherosclerotic calcification of the major vessels at the base of the brain. Skull: Negative Sinuses/Orbits: Clear/normal Other:  None ASPECTS ([place] Stroke Program Early CT Score) - Ganglionic level infarction (caudate, lentiform nuclei, internal capsule, insula, M1-M3 cortex): 7 - Supraganglionic infarction (M4-M6 cortex): 3 Total score (0-10 with 10 being normal): 10 IMPRESSION: 1. No acute or focal finding.  Normal for age. 2. ASPECTS is 10. 3. These results were called by telephone at the time of interpretation on 11/22/2019 at 8:06 pm to provider [facility] , who verbally acknowledged these results. Electronically Signed   By: Nelson Chimes M.D.   On: 11/22/2019 20:06    EKG: I independently viewed the EKG done and my findings are as followed: Sinus rhythm at rate of 66 bpm  Assessment/Plan Present on Admission: . TIA (transient ischemic attack)  Principal Problem:   TIA (transient ischemic attack) Active Problems:   Diabetes mellitus without complication (HCC)   Essential hypertension   UTI (urinary tract infection)   Hyperglycemia   Type 2 diabetes mellitus with hyperlipidemia (HCC)  Transient ischemic attack Patient will be admitted to telemetry unit  B/L carotid US in the morning Echocardiogram in the morning MRI of brain without contrast in the morning Continue aspirin 81mg  Continue fall precautions and neuro checks Hemoglobin A1c will be checked Continue PT/OT eval and treat Bedside swallow eval by nursing passed, diet started Consider CTA of head and neck for recurrence of symptoms per tele-neurology recommendation Tele neurology consult appreciated   UTI POA Patient complained of burning sensation on urination Urinalysis and urine culture obtained and pending Patient was started on IV ceftriaxone  Hyperglycemia secondary to type 2 diabetes mellitus Continue insulin sliding scale and hypoglycemia protocol Metformin will be held at this time  Essential hypertension  BP at 151/62, permissive hypertension will be allowed at this time  Hyperlipidemia  Continue atorvastatin and Lovaza per  home regimen when med rec is updated  DVT prophylaxis: SCDs  Code Status: Full  Family Communication: Daughter was at bedside (all questions answered to satisfaction)  Disposition Plan:  Patient is from:  home Anticipated DC to:                   SNF or family members home Anticipated DC date:              24hours Anticipated DC barriers:         Pending further TIA work-up in the morning   Consults called: Teleneurology  Admission status: Observation    Bernadette Hoit MD [facility]  If 7PM-7AM, please contact night-coverage [email or web address]  11/23/2019, 12:35 AM

## 2019-11-23 ENCOUNTER — Observation Stay (HOSPITAL_BASED_OUTPATIENT_CLINIC_OR_DEPARTMENT_OTHER): Payer: Medicare Other

## 2019-11-23 ENCOUNTER — Observation Stay (HOSPITAL_COMMUNITY): Payer: Medicare Other

## 2019-11-23 ENCOUNTER — Encounter (HOSPITAL_COMMUNITY): Payer: Self-pay | Admitting: Internal Medicine

## 2019-11-23 DIAGNOSIS — E1169 Type 2 diabetes mellitus with other specified complication: Secondary | ICD-10-CM

## 2019-11-23 DIAGNOSIS — G459 Transient cerebral ischemic attack, unspecified: Secondary | ICD-10-CM

## 2019-11-23 DIAGNOSIS — R739 Hyperglycemia, unspecified: Secondary | ICD-10-CM

## 2019-11-23 DIAGNOSIS — E785 Hyperlipidemia, unspecified: Secondary | ICD-10-CM

## 2019-11-23 DIAGNOSIS — I10 Essential (primary) hypertension: Secondary | ICD-10-CM | POA: Diagnosis not present

## 2019-11-23 LAB — URINALYSIS, ROUTINE W REFLEX MICROSCOPIC
Bacteria, UA: NONE SEEN
Bilirubin Urine: NEGATIVE
Glucose, UA: NEGATIVE mg/dL
Hgb urine dipstick: NEGATIVE
Ketones, ur: NEGATIVE mg/dL
Leukocytes,Ua: NEGATIVE
Nitrite: NEGATIVE
Protein, ur: 30 mg/dL — AB
Specific Gravity, Urine: 1.016 (ref 1.005–1.030)
pH: 5 (ref 5.0–8.0)

## 2019-11-23 LAB — CBC
HCT: 30.8 % — ABNORMAL LOW (ref 36.0–46.0)
Hemoglobin: 9.3 g/dL — ABNORMAL LOW (ref 12.0–15.0)
MCH: 26.5 pg (ref 26.0–34.0)
MCHC: 30.2 g/dL (ref 30.0–36.0)
MCV: 87.7 fL (ref 80.0–100.0)
Platelets: 283 10*3/uL (ref 150–400)
RBC: 3.51 MIL/uL — ABNORMAL LOW (ref 3.87–5.11)
RDW: 16.3 % — ABNORMAL HIGH (ref 11.5–15.5)
WBC: 7 10*3/uL (ref 4.0–10.5)
nRBC: 0 % (ref 0.0–0.2)

## 2019-11-23 LAB — ECHOCARDIOGRAM COMPLETE
Height: 64 in
Weight: 2480 oz

## 2019-11-23 LAB — RAPID URINE DRUG SCREEN, HOSP PERFORMED
Amphetamines: NOT DETECTED
Barbiturates: NOT DETECTED
Benzodiazepines: NOT DETECTED
Cocaine: NOT DETECTED
Opiates: NOT DETECTED
Tetrahydrocannabinol: NOT DETECTED

## 2019-11-23 LAB — GLUCOSE, CAPILLARY
Glucose-Capillary: 130 mg/dL — ABNORMAL HIGH (ref 70–99)
Glucose-Capillary: 261 mg/dL — ABNORMAL HIGH (ref 70–99)
Glucose-Capillary: 140 mg/dL — ABNORMAL HIGH (ref 70–99)

## 2019-11-23 LAB — COMPREHENSIVE METABOLIC PANEL
ALT: 12 U/L (ref 0–44)
AST: 16 U/L (ref 15–41)
Albumin: 3.7 g/dL (ref 3.5–5.0)
Alkaline Phosphatase: 64 U/L (ref 38–126)
Anion gap: 8 (ref 5–15)
BUN: 18 mg/dL (ref 8–23)
CO2: 25 mmol/L (ref 22–32)
Calcium: 9.2 mg/dL (ref 8.9–10.3)
Chloride: 104 mmol/L (ref 98–111)
Creatinine, Ser: 0.79 mg/dL (ref 0.44–1.00)
GFR calc Af Amer: >60 mL/min (ref >60)
GFR calc non Af Amer: >60 mL/min (ref >60)
Glucose, Bld: 146 mg/dL — ABNORMAL HIGH (ref 70–99)
Potassium: 3.8 mmol/L (ref 3.5–5.1)
Sodium: 137 mmol/L (ref 135–145)
Total Bilirubin: 0.4 mg/dL (ref 0.3–1.2)
Total Protein: 6.7 g/dL (ref 6.5–8.1)

## 2019-11-23 LAB — CK TOTAL AND CKMB (NOT AT ARMC)
CK, MB: 3.9 ng/mL (ref 0.5–5.0)
Relative Index: 2.8 — ABNORMAL HIGH (ref 0.0–2.5)
Total CK: 138 U/L (ref 38–234)

## 2019-11-23 LAB — HEMOGLOBIN A1C
Hgb A1c MFr Bld: 7.8 % — ABNORMAL HIGH (ref 4.8–5.6)
Mean Plasma Glucose: 177.16 mg/dL

## 2019-11-23 LAB — LIPID PANEL
Cholesterol: 194 mg/dL (ref 0–200)
HDL: 34 mg/dL — ABNORMAL LOW (ref >40)
LDL Cholesterol: 102 mg/dL — ABNORMAL HIGH (ref 0–99)
Total CHOL/HDL Ratio: 5.7 RATIO
Triglycerides: 291 mg/dL — ABNORMAL HIGH (ref <150)
VLDL: 58 mg/dL — ABNORMAL HIGH (ref 0–40)

## 2019-11-23 LAB — PROTIME-INR
INR: 1 (ref 0.8–1.2)
Prothrombin Time: 13.1 seconds (ref 11.4–15.2)

## 2019-11-23 LAB — APTT: aPTT: 26 seconds (ref 24–36)

## 2019-11-23 MED ORDER — MELOXICAM 15 MG PO TABS: 15.0000 mg | ORAL_TABLET | Freq: Every day | ORAL | Status: DC | PRN

## 2019-11-23 MED ORDER — INSULIN ASPART 100 UNIT/ML ~~LOC~~ SOLN: 0.0000 [IU] | Freq: Every day | SUBCUTANEOUS | Status: DC

## 2019-11-23 MED ORDER — SODIUM CHLORIDE 0.9 % IV SOLN
1.0000 g | INTRAVENOUS | Status: DC
  Administered 2019-11-23: 1 g via INTRAVENOUS
  Filled 2019-11-23: qty 10

## 2019-11-23 MED ORDER — GABAPENTIN 100 MG PO CAPS: 100.0000 mg | ORAL_CAPSULE | Freq: Every day | ORAL | Status: DC

## 2019-11-23 MED ORDER — CALCIUM CARBONATE-VITAMIN D 500-200 MG-UNIT PO TABS
1.0000 | ORAL_TABLET | Freq: Every day | ORAL | Status: DC
  Filled 2019-11-23: qty 1

## 2019-11-23 MED ORDER — OMEPRAZOLE 20 MG PO CPDR: 20.0000 mg | DELAYED_RELEASE_CAPSULE | Freq: Every day | ORAL | 1 refills | Status: DC

## 2019-11-23 MED ORDER — INSULIN ASPART 100 UNIT/ML ~~LOC~~ SOLN: 10.0000 [IU] | Freq: Three times a day (TID) | SUBCUTANEOUS | Status: DC

## 2019-11-23 MED ORDER — ATORVASTATIN CALCIUM 80 MG PO TABS: 80.0000 mg | ORAL_TABLET | Freq: Every day | ORAL | 1 refills | Status: DC

## 2019-11-23 MED ORDER — AMLODIPINE BESYLATE 5 MG PO TABS
10.0000 mg | ORAL_TABLET | Freq: Every day | ORAL | Status: DC
  Filled 2019-11-23: qty 2

## 2019-11-23 MED ORDER — CEFTRIAXONE SODIUM 1 G IJ SOLR: 1.0000 g | INTRAMUSCULAR | Status: DC

## 2019-11-23 MED ORDER — ASPIRIN 325 MG PO TBEC: 325.0000 mg | DELAYED_RELEASE_TABLET | Freq: Every day | ORAL | 1 refills | Status: DC

## 2019-11-23 MED ORDER — LOSARTAN POTASSIUM 50 MG PO TABS
50.0000 mg | ORAL_TABLET | Freq: Every day | ORAL | Status: DC
  Filled 2019-11-23: qty 1

## 2019-11-23 MED ORDER — INSULIN ASPART 100 UNIT/ML ~~LOC~~ SOLN
0.0000 [IU] | Freq: Three times a day (TID) | SUBCUTANEOUS | Status: DC
  Administered 2019-11-23: 2 [IU] via SUBCUTANEOUS
  Administered 2019-11-23: 8 [IU] via SUBCUTANEOUS

## 2019-11-23 MED ORDER — ATORVASTATIN CALCIUM 40 MG PO TABS
40.0000 mg | ORAL_TABLET | Freq: Every day | ORAL | Status: DC
  Administered 2019-11-23: 40 mg via ORAL
  Filled 2019-11-23: qty 1

## 2019-11-23 MED ORDER — OMEGA-3-ACID ETHYL ESTERS 1 G PO CAPS
2.0000 g | ORAL_CAPSULE | Freq: Two times a day (BID) | ORAL | Status: DC
  Administered 2019-11-23: 2 g via ORAL
  Filled 2019-11-23: qty 2

## 2019-11-23 MED ORDER — ASPIRIN EC 81 MG PO TBEC
81.0000 mg | DELAYED_RELEASE_TABLET | Freq: Every day | ORAL | Status: DC
  Filled 2019-11-23: qty 1

## 2019-11-23 NOTE — Evaluation (Signed)
Physical Therapy Evaluation Patient Details Name: Jessica Mclean MRN: [id number] DOB: 1945-11-09 Today's Date: 11/23/2019   History of Present Illness  Jessica Mclean is a 74 y.o. female with medical history significant for T2DM, HLD and hypertension who presents to the ED due to sudden onset of garbled speech with difficulty in getting words out that started about 6:30PM, she has since been back to baseline and this was confirmed by daughter at bedside. She denies previous episode of this, but she also complains of burning sensation on urination which has been been going for few days. patient denies fever, chills, CP, SOB, diarrhea or constipation.    Clinical Impression  Patient functioning at baseline for functional mobility and gait.  Plan:  Patient discharged from physical therapy to care of nursing for ambulation daily as tolerated for length of stay.     Follow Up Recommendations No PT follow up    Equipment Recommendations  None recommended by PT    Recommendations for Other Services       Precautions / Restrictions Precautions Precautions: None Restrictions Weight Bearing Restrictions: No      Mobility  Bed Mobility Overal bed mobility: Independent                Transfers Overall transfer level: Independent Equipment used: None                Ambulation/Gait Ambulation/Gait assistance: Modified independent (Device/Increase time) Gait Distance (Feet): 200 Feet Assistive device: None Gait Pattern/deviations: WFL(Within Functional Limits) Gait velocity: slightly decreased   General Gait Details: demonstrates good return for ambulation on level, inclined and declined surfaces without loss of balance  Stairs Stairs: Yes Stairs assistance: Modified independent (Device/Increase time) Stair Management: Two rails;Alternating pattern;Step to pattern Number of Stairs: 5 General stair comments: demonstrates good return for going up/down steps without loss  of balance, requires more time when coming down steps due to limited depth perception, "per patient"  Wheelchair Mobility    Modified Rankin (Stroke Patients Only)       Balance Overall balance assessment: Independent                                           Pertinent Vitals/Pain Pain Assessment: No/denies pain    Home Living Family/patient expects to be discharged to:: Private residence Living Arrangements: Alone Available Help at Discharge: Family;Available PRN/intermittently Type of Home: House Home Access: Stairs to enter Entrance Stairs-Rails: Right;Left;Can reach both Entrance Stairs-Number of Steps: 5 Home Layout: One level Home Equipment: Walker - 2 wheels;Cane - quad;Cane - single point      Prior Function Level of Independence: Independent         Comments: Hydrographic surveyor, drives     Hand Dominance   Dominant Hand: Right    Extremity/Trunk Assessment   Upper Extremity Assessment Upper Extremity Assessment: Defer to OT evaluation    Lower Extremity Assessment Lower Extremity Assessment: Overall WFL for tasks assessed    Cervical / Trunk Assessment Cervical / Trunk Assessment: Normal  Communication   Communication: No difficulties  Cognition Arousal/Alertness: Awake/alert Behavior During Therapy: WFL for tasks assessed/performed Overall Cognitive Status: Within Functional Limits for tasks assessed  General Comments      Exercises     Assessment/Plan    PT Assessment Patent does not need any further PT services  PT Problem List         PT Treatment Interventions      PT Goals (Current goals can be found in the Care Plan section)  Acute Rehab PT Goals Patient Stated Goal: return home with family to assist PT Goal Formulation: With patient Time For Goal Achievement: 11/23/19 Potential to Achieve Goals: Good    Frequency     Barriers to discharge         Co-evaluation               AM-PAC PT "6 Clicks" Mobility  Outcome Measure Help needed turning from your back to your side while in a flat bed without using bedrails?: None Help needed moving from lying on your back to sitting on the side of a flat bed without using bedrails?: None Help needed moving to and from a bed to a chair (including a wheelchair)?: None Help needed standing up from a chair using your arms (e.g., wheelchair or bedside chair)?: None Help needed to walk in hospital room?: None Help needed climbing 3-5 steps with a railing? : None 6 Click Score: 24    End of Session   Activity Tolerance: Patient tolerated treatment well Patient left: in bed;with call bell/phone within reach Nurse Communication: Mobility status PT Visit Diagnosis: Unsteadiness on feet (R26.81);Other abnormalities of gait and mobility (R26.89);Muscle weakness (generalized) (M62.81)    Time: 8413-2440 PT Time Calculation (min) (ACUTE ONLY): 21 min   Charges:   PT Evaluation $PT Eval Moderate Complexity: 1 Mod PT Treatments $Therapeutic Activity: 8-22 mins        11:50 AM, 11/23/19 Lonell Grandchild, MPT Physical Therapist with [facility] [phone] office [phone] mobile phone

## 2019-11-23 NOTE — Progress Notes (Signed)
*  PRELIMINARY RESULTS* Echocardiogram 2D Echocardiogram has been performed.  Jessica Mclean 11/23/2019, 1:48 PM

## 2019-11-23 NOTE — Care Management Obs Status (Signed)
[facility] NOTIFICATION   Patient Details  Name: Jessica Mclean MRN: [id number] Date of Birth: 26-Sep-1945   Medicare Observation Status Notification Given:  Yes    Shade Flood, LCSW 11/23/2019, 2:28 PM

## 2019-11-23 NOTE — Progress Notes (Signed)
0825: Pt taken down to radiology department via [facility] for MRI and carotid ultrasound. 0935: Pt back to room from radiology. Family at bedside. Pt allowed to eat breakfast prior to completion of VS and stroke reassessment. 1017: Repeat stroke assessment complete, no deficits noted. VSS.

## 2019-11-23 NOTE — Progress Notes (Signed)
Pt declined to take her oral meds that were ordered for this afternoon. Pt states she just received a call from her insurance company informing her that because she was not considered an inpatient, she may be responsible for the cost of some of her tests and medications. Pt states, "I don't want to pay for pills that I can take when I get home in a few hours".

## 2019-11-23 NOTE — Discharge Instructions (Signed)
Transient Ischemic Attack ° °A transient ischemic attack (TIA) is a "warning stroke" that causes stroke-like symptoms that go away quickly. A TIA does not cause lasting damage to the brain. But having a TIA is a sign that you may be at risk for a stroke. Lifestyle changes and medical treatments can help prevent a stroke. °It is important to know the symptoms of a TIA and what to do. Get help right away, even if your symptoms go away. The symptoms of a TIA are the same as those of a stroke. They can happen fast, and they usually go away within minutes or hours. They can include: °· Weakness or loss of feeling in your face, arm, or leg. This often happens on one side of your body. °· Trouble walking. °· Trouble moving your arms or legs. °· Trouble talking or understanding what people are saying. °· Trouble seeing. °· Seeing two of one object (double vision). °· Feeling dizzy. °· Feeling confused. °· Loss of balance or coordination. °· Feeling sick to your stomach (nauseous) and throwing up (vomiting). °· A very bad headache for no reason. °What increases the risk? °Certain things may make you more likely to have a TIA. Some of these are things that you can change, such as: °· Being very overweight (obese). °· Using products that contain nicotine or tobacco, such as cigarettes and e-cigarettes. °· Taking birth control pills. °· Not being active. °· Drinking too much alcohol. °· Using drugs. °Other risk factors include: °· Having an irregular heartbeat (atrial fibrillation). °· Being African American or Hispanic. °· Having had blood clots, stroke, TIA, or heart attack in the past. °· Being a woman with a history of high blood pressure in pregnancy (preeclampsia). °· Being over the age of 60. °· Being female. °· Having family history of stroke. °· Having the following diseases or conditions: °? High blood pressure. °? High cholesterol. °? Diabetes. °? Heart disease. °? Sickle cell disease. °? Sleep apnea. °? Migraine  headache. °? Long-term (chronic) diseases that cause soreness and swelling (inflammation). °? Disorders that affect how your blood clots. °Follow these instructions at home: °Medicines ° °· Take over-the-counter and prescription medicines only as told by your doctor. °· If you were told to take aspirin or another medicine to thin your blood, take it exactly as told by your doctor. °? Taking too much of the medicine can cause bleeding. °? Taking too little of the medicine may not work to treat the problem. °Eating and drinking ° °· Eat 5 or more servings of fruits and vegetables each day. °· Follow instructions from your doctor about your diet. You may need to follow a certain diet to help lower your risk of having a stroke. You may need to: °? Eat a diet that is low in fat and salt. °? Eat foods that contain a lot of fiber. °? Limit the amount of carbohydrates and sugar in your diet. °· Limit alcohol intake to 1 drink a day for nonpregnant women and 2 drinks a day for men. One drink equals 12 oz of beer, 5 oz of wine, or 1½ oz of hard liquor. °General instructions °· Keep a healthy weight. °· Stay active. Try to get at least 30 minutes of activity on all or most days. °· Find out if you have a condition called sleep apnea. Get treatment if needed. °· Do not use any products that contain nicotine or tobacco, such as cigarettes and e-cigarettes. If you need help quitting,   ask your doctor.  Do not abuse drugs.  Keep all follow-up visits as told by your doctor. This is important. Get help right away if:  You have any signs of stroke. "BE FAST" is an easy way to remember the main warning signs: ? B - Balance. Signs are dizziness, sudden trouble walking, or loss of balance. ? E - Eyes. Signs are trouble seeing or a sudden change in how you see. ? F - Face. Signs are sudden weakness or loss of feeling of the face, or the face or eyelid drooping on one side. ? A - Arms. Signs are weakness or loss of feeling in an  arm. This happens suddenly and usually on one side of the body. ? S - Speech. Signs are sudden trouble speaking, slurred speech, or trouble understanding what people say. ? T - Time. Time to call emergency services. Write down what time symptoms started.  You have other signs of stroke, such as: ? A sudden, very bad headache with no known cause. ? Feeling sick to your stomach (nausea). ? Throwing up (vomiting). ? Jerky movements that you cannot control (seizure). These symptoms may be an emergency. Do not wait to see if the symptoms will go away. Get medical help right away. Call your local emergency services (911 in the [place] Do not drive yourself to the hospital. Summary  A transient ischemic attack (TIA) is a "warning stroke" that causes stroke-like symptoms that go away quickly.  A TIA is a medical emergency. Get help right away, even if your symptoms go away.  A TIA does not cause lasting damage to the brain.  Having a TIA is a sign that you may be at risk for a stroke. Lifestyle changes and medical treatments can help prevent a stroke. This information is not intended to replace advice given to you by your health care provider. Make sure you discuss any questions you have with your health care provider. Document Revised: 01/29/2018 Document Reviewed: 08/06/2016 Elsevier Patient Education  2020 [employer].    IMPORTANT INFORMATION: PAY CLOSE ATTENTION   PHYSICIAN DISCHARGE INSTRUCTIONS  Follow with Primary care provider  Jani Gravel, MD  and other consultants as instructed by your Hospitalist Physician  [place] IF SYMPTOMS COME BACK, WORSEN OR NEW PROBLEM DEVELOPS   Please note: You were cared for by a hospitalist during your hospital stay. Every effort will be made to forward records to your primary care provider.  You can request that your primary care provider send for your hospital records if they have not received them.  Once you  are discharged, your primary care physician will handle any further medical issues. Please note that NO REFILLS for any discharge medications will be authorized once you are discharged, as it is imperative that you return to your primary care physician (or establish a relationship with a primary care physician if you do not have one) for your post hospital discharge needs so that they can reassess your need for medications and monitor your lab values.  Please get a complete blood count and chemistry panel checked by your Primary MD at your next visit, and again as instructed by your Primary MD.  Get Medicines reviewed and adjusted: Please take all your medications with you for your next visit with your Primary MD  Laboratory/radiological data: Please request your Primary MD to go over all hospital tests and procedure/radiological results at the follow up, please ask your primary care  provider to get all Hospital records sent to his/her office.  In some cases, they will be blood work, cultures and biopsy results pending at the time of your discharge. Please request that your primary care provider follow up on these results.  If you are diabetic, please bring your blood sugar readings with you to your follow up appointment with primary care.    Please call and make your follow up appointments as soon as possible.    Also Note the following: If you experience worsening of your admission symptoms, develop shortness of breath, life threatening emergency, suicidal or homicidal thoughts you must seek medical attention immediately by calling 911 or calling your MD immediately  if symptoms less severe.  You must read complete instructions/literature along with all the possible adverse reactions/side effects for all the Medicines you take and that have been prescribed to you. Take any new Medicines after you have completely understood and accpet all the possible adverse reactions/side effects.   Do not  drive when taking Pain medications or sleeping medications (Benzodiazepines)  Do not take more than prescribed Pain, Sleep and Anxiety Medications. It is not advisable to combine anxiety,sleep and pain medications without talking with your primary care practitioner  Special Instructions: If you have smoked or chewed Tobacco  in the last 2 yrs please stop smoking, stop any regular Alcohol  and or any Recreational drug use.  Wear Seat belts while driving.  Do not drive if taking any narcotic, mind altering or controlled substances or recreational drugs or alcohol.

## 2019-11-23 NOTE — Discharge Summary (Signed)
Physician Discharge Summary  Jessica Mclean IFO:[id number] DOB: [birth year] DOA: 11/22/2019  PCP: Jani Gravel, MD  Admit date: 11/22/2019 Discharge date: 11/23/2019  Admitted From:  Home  Disposition:  Home with [facility]  Recommendations for Outpatient Follow-up:  1. Follow up with PCP in 1 weeks 2. Follow up with neurology clinic in 2 weeks to establish care  Home Health: RN   Discharge Condition: STABLE   CODE STATUS: FULL    Brief Hospitalization Summary: Please see all hospital notes, images, labs for full details of the hospitalization.  ADMISSION HPI: Jessica Mclean is a 74 y.o. female with medical history significant for T2DM, HLD and hypertension who presents to the ED due to sudden onset of garbled speech with difficulty in getting words out that started about 6:30PM, she has since been back to baseline and this was confirmed by daughter at bedside. She denies previous episode of this, but she also complains of burning sensation on urination which has been been going for few days. patient denies fever, chills, CP, SOB, diarrhea or constipation.  ED Course:  In the ED, Initial temp was 97.76F, but this increased to 98.96F with blankets, BP was 168/103. Hyperglycemia and normocytic anemia was noted. Sars coronavirus was negative. CT head without contrast showed  no acute or focal finding. Tele-neurologist was consulted and recommended admitting pt. for TIA work up.  Transient ischemic attack - SYMPTOMS COMPLETELY RESOLVED Patient was admitted to telemetry unit  B/L carotid US with no significant stenosis seen Echocardiogram below  MRI of brain without contrast reviewed with neurologist Dr. Leonel Ramsay and no acute findings, artifact noted but no acute ischemia.  Outpatient neurology follow up recommended.   Continue aspirin 325 mg daily.  Continue PT/OT evals completed, no PT follow up recommended.  Bedside swallow eval by nursing passed, diet started [facility] neurology consult appreciated    2D Echocardiogram 11/23/19 IMPRESSIONS  1. Left ventricular ejection fraction, by estimation, is 65 to 70%. The left ventricle has normal function. The left ventricle has no regional  wall motion abnormalities. Left ventricular diastolic parameters are consistent with Grade I diastolic dysfunction (impaired relaxation).  2. Right ventricular systolic function is normal. The right ventricular size is normal. Tricuspid regurgitation signal is inadequate for assessing PA pressure.  3. Left atrial size was mildly dilated.  4. The mitral valve is grossly normal. Trivial mitral valve regurgitation.  5. The aortic valve is tricuspid. Aortic valve regurgitation is not visualized.  6. The inferior vena cava is normal in size with greater than 50% respiratory variability, suggesting right atrial pressure of 3 mmHg.   UTI - TREATED   Hyperglycemia secondary to type 2 diabetes mellitus uncontrolled  Poorly controlled as evidenced by A1c of 7.8%.  She needs closer outpatient follow up and management. Resume home treatment program and follow closely with PCP.   Essential hypertension  Resume home BP meds and follow up closely outpatient.     Hyperlipidemia  Continue atorvastatin and Lovaza per home regimen when med rec is updated Increased atorvastatin to 80 mg daily.   DVT prophylaxis: SCDs  Code Status: Full  Family Communication: Daughter updated by telephone  Discharge Diagnoses:  Principal Problem:   TIA (transient ischemic attack) Active Problems:   Diabetes mellitus without complication ([place])   Essential hypertension   UTI (urinary tract infection)   Hyperglycemia   Type 2 diabetes mellitus with hyperlipidemia [facility])   Discharge Instructions: Discharge Instructions    Ambulatory referral to Neurology   Complete by: As  directed    An appointment is requested in approximately: 2 weeks     Allergies as of 11/23/2019      Reactions   Other    Nickel staples       Medication List    STOP taking these medications   aspirin-acetaminophen-caffeine 250-250-65 MG tablet Commonly known as: EXCEDRIN MIGRAINE   nitrofurantoin (macrocrystal-monohydrate) 100 MG capsule Commonly known as: MACROBID   predniSONE 10 MG (21) Tbpk tablet Commonly known as: STERAPRED UNI-PAK 21 TAB     TAKE these medications   amLODipine 10 MG tablet Commonly known as: NORVASC Take 10 mg by mouth daily.   aspirin 325 MG EC tablet Take 1 tablet (325 mg total) by mouth daily. Swallow whole. Start taking on: November 24, 2019   atorvastatin 80 MG tablet Commonly known as: LIPITOR Take 1 tablet (80 mg total) by mouth daily. What changed:   medication strength  how much to take  Another medication with the same name was removed. Continue taking this medication, and follow the directions you see here.   Calcium 500+D High Potency 500-400 MG-UNIT tablet Generic drug: calcium-vitamin D Take 1 tablet by mouth daily.   desonide 0.05 % ointment Commonly known as: DESOWEN Apply 1 application topically 2 (two) times daily. What changed:   when to take this  reasons to take this   gabapentin 100 MG capsule Commonly known as: NEURONTIN Take 100 mg by mouth at bedtime.   insulin regular 100 units/mL injection Commonly known as: NOVOLIN R Inject 10-15 Units into the skin 2 (two) times daily before a meal. As directed by sliding scale-new chart by MD   Januvia 50 MG tablet Generic drug: sitaGLIPtin Take 50 mg by mouth daily.   losartan 50 MG tablet Commonly known as: COZAAR Take 50 mg by mouth daily.   meloxicam 15 MG tablet Commonly known as: MOBIC Take 1 tablet (15 mg total) by mouth daily as needed for pain. What changed:   when to take this  reasons to take this   metFORMIN 1000 MG tablet Commonly known as: GLUCOPHAGE Take 1,000 mg by mouth 2 (two) times daily with a meal.   MiraLax 17 GM/SCOOP powder Generic drug: polyethylene glycol powder Take 17  g by mouth daily as needed for mild constipation or moderate constipation.   omega-3 acid ethyl esters 1 g capsule Commonly known as: LOVAZA Take 2 g by mouth 2 (two) times daily.   omeprazole 20 MG capsule Commonly known as: PriLOSEC Take 1 capsule (20 mg total) by mouth daily.       Follow-up Information    [facility] Follow [facility].   Specialty: Secretary Why: [facility] staff will call you to schedule Home Health RN visit       Phillips Odor, MD. Schedule an appointment as soon as possible for a visit in 2 week(s).   Specialty: Neurology Why: Hospital Follow Up for TIA/Ministroke Contact information: [place] [address] [phone]        Jani Gravel, MD. Schedule an appointment as soon as possible for a visit in 1 week(s).   Specialty: Internal Medicine Why: Hospital Follow Up Contact information: [address] [place] [address] [phone]              Allergies  Allergen Reactions  . Other     Nickel staples   Allergies as of 11/23/2019      Reactions  Other    Nickel staples      Medication List    STOP taking these medications   aspirin-acetaminophen-caffeine 250-250-65 MG tablet Commonly known as: EXCEDRIN MIGRAINE   nitrofurantoin (macrocrystal-monohydrate) 100 MG capsule Commonly known as: MACROBID   predniSONE 10 MG (21) Tbpk tablet Commonly known as: STERAPRED UNI-PAK 21 TAB     TAKE these medications   amLODipine 10 MG tablet Commonly known as: NORVASC Take 10 mg by mouth daily.   aspirin 325 MG EC tablet Take 1 tablet (325 mg total) by mouth daily. Swallow whole. Start taking on: November 24, 2019   atorvastatin 80 MG tablet Commonly known as: LIPITOR Take 1 tablet (80 mg total) by mouth daily. What changed:   medication strength  how much to take  Another medication with the same name was removed. Continue taking this medication, and follow  the directions you see here.   Calcium 500+D High Potency 500-400 MG-UNIT tablet Generic drug: calcium-vitamin D Take 1 tablet by mouth daily.   desonide 0.05 % ointment Commonly known as: DESOWEN Apply 1 application topically 2 (two) times daily. What changed:   when to take this  reasons to take this   gabapentin 100 MG capsule Commonly known as: NEURONTIN Take 100 mg by mouth at bedtime.   insulin regular 100 units/mL injection Commonly known as: NOVOLIN R Inject 10-15 Units into the skin 2 (two) times daily before a meal. As directed by sliding scale-new chart by MD   Januvia 50 MG tablet Generic drug: sitaGLIPtin Take 50 mg by mouth daily.   losartan 50 MG tablet Commonly known as: COZAAR Take 50 mg by mouth daily.   meloxicam 15 MG tablet Commonly known as: MOBIC Take 1 tablet (15 mg total) by mouth daily as needed for pain. What changed:   when to take this  reasons to take this   metFORMIN 1000 MG tablet Commonly known as: GLUCOPHAGE Take 1,000 mg by mouth 2 (two) times daily with a meal.   MiraLax 17 GM/SCOOP powder Generic drug: polyethylene glycol powder Take 17 g by mouth daily as needed for mild constipation or moderate constipation.   omega-3 acid ethyl esters 1 g capsule Commonly known as: LOVAZA Take 2 g by mouth 2 (two) times daily.   omeprazole 20 MG capsule Commonly known as: PriLOSEC Take 1 capsule (20 mg total) by mouth daily.       Procedures/Studies: MR BRAIN WO CONTRAST  Result Date: 11/23/2019 CLINICAL DATA:  Neuro deficit, acute, stroke suspected. EXAM: MRI HEAD WITHOUT CONTRAST TECHNIQUE: Multiplanar, multiecho pulse sequences of the brain and surrounding structures were obtained without intravenous contrast. COMPARISON:  Noncontrast head CT 11/22/2019. FINDINGS: Brain: There is a punctate focus of diffusion weighted hyperintensity within the left pons on the axial diffusion-weighted sequence (series 3, image 14). There is no  definite ADC correlate and this finding is not confirmed on the coronal diffusion-weighted imaging. This may reflect a small punctate acute infarct versus artifact. Minimal scattered T2/FLAIR hyperintensity within the cerebral white matter is nonspecific, but consistent with chronic small vessel ischemic disease. There is a 2 mm extra-axial focus of T2/FLAIR hyperintensity and restricted diffusion overlying the left parietal lobe. (Series 3, image 30) (series 4, image 31) (series 8, image 27). This finding is nonspecific but may reflect a tiny meningioma. There is no acute infarct. No chronic intracranial blood products. No extra-axial fluid collection. No midline shift. Partially empty sella turcica. Vascular: Expected proximal arterial flow voids. Skull and  upper cervical spine: No focal marrow lesion. Sinuses/Orbits: Visualized orbits show no acute finding. Minimal ethmoid sinus mucosal thickening. No significant mastoid effusion. IMPRESSION: Punctate acute infarct versus artifact within the paramedian left pons as described. No evidence of acute infarct elsewhere within the brain. 2 mm extra-axial focus of FLAIR hyperintensity and restricted diffusion overlying the left parietal lobe. This finding is nonspecific but may reflect a tiny incidental meningioma. Mild generalized parenchymal atrophy and chronic small vessel ischemic disease. Minimal ethmoid sinus mucosal thickening. Electronically Signed   By: Kellie Simmering DO   On: 11/23/2019 09:21   US Carotid Bilateral  Result Date: 11/23/2019 CLINICAL DATA:  Hypertension, TIA symptoms, aphasia, concern for stroke EXAM: BILATERAL CAROTID DUPLEX ULTRASOUND TECHNIQUE: Pearline Cables scale imaging, color Doppler and duplex ultrasound were performed of bilateral carotid and vertebral arteries in the neck. COMPARISON:  None. FINDINGS: Criteria: Quantification of carotid stenosis is based on velocity parameters that correlate the residual internal carotid diameter with  NASCET-based stenosis levels, using the diameter of the distal internal carotid lumen as the denominator for stenosis measurement. The following velocity measurements were obtained: RIGHT ICA: 67/21 cm/sec CCA: 48/88 cm/sec SYSTOLIC ICA/CCA RATIO:  0.9 ECA: 64 cm/sec LEFT ICA: 84/25 cm/sec CCA: 91/69 cm/sec SYSTOLIC ICA/CCA RATIO:  0.9 ECA: 70 cm/sec RIGHT CAROTID ARTERY: Minor echogenic shadowing plaque formation. No hemodynamically significant right ICA stenosis, velocity elevation, or turbulent flow. Degree of narrowing less than 50%. RIGHT VERTEBRAL ARTERY:  Normal antegrade flow LEFT CAROTID ARTERY: Similar scattered minor echogenic plaque formation. No hemodynamically significant left ICA stenosis, velocity elevation, or turbulent flow. LEFT VERTEBRAL ARTERY:  Normal antegrade flow IMPRESSION: Minor carotid atherosclerosis. No hemodynamically significant ICA stenosis. Degree of narrowing less than 50% bilaterally by ultrasound criteria. Patent antegrade vertebral flow bilaterally Electronically Signed   By: Jerilynn Mages.  Shick M.D.   On: 11/23/2019 09:51   ECHOCARDIOGRAM COMPLETE  Result Date: 11/23/2019    ECHOCARDIOGRAM REPORT   Patient Name:   Jessica Mclean Date of Exam: 11/23/2019 Medical Rec #:  [id number]      Height:       64.0 in Accession #:    [id number]     Weight:       155.0 lb Date of Birth:  05-12-1946      BSA:          1.756 m Patient Age:    25 years       BP:           147/71 mmHg Patient Gender: F              HR:           66 bpm. Exam Location:  [facility] Procedure: 2D Echo, Cardiac Doppler and Color Doppler Indications:    TIA 435.9 / G45.9  History:        Patient has no prior history of Echocardiogram examinations.                 Risk Factors:Hypertension, Diabetes and Dyslipidemia.  Sonographer:    Alvino Chapel RCS Referring Phys: [id number] OLADAPO ADEFESO IMPRESSIONS  1. Left ventricular ejection fraction, by estimation, is 65 to 70%. The left ventricle has normal function. The left  ventricle has no regional wall motion abnormalities. Left ventricular diastolic parameters are consistent with Grade I diastolic dysfunction (impaired relaxation).  2. Right ventricular systolic function is normal. The right ventricular size is normal. Tricuspid regurgitation signal is inadequate for assessing PA pressure.  3. Left atrial size  was mildly dilated.  4. The mitral valve is grossly normal. Trivial mitral valve regurgitation.  5. The aortic valve is tricuspid. Aortic valve regurgitation is not visualized.  6. The inferior vena cava is normal in size with greater than 50% respiratory variability, suggesting right atrial pressure of 3 mmHg. FINDINGS  Left Ventricle: Left ventricular ejection fraction, by estimation, is 65 to 70%. The left ventricle has normal function. The left ventricle has no regional wall motion abnormalities. The left ventricular internal cavity size was normal in size. There is  borderline left ventricular hypertrophy. Left ventricular diastolic parameters are consistent with Grade I diastolic dysfunction (impaired relaxation). Right Ventricle: The right ventricular size is normal. No increase in right ventricular wall thickness. Right ventricular systolic function is normal. Tricuspid regurgitation signal is inadequate for assessing PA pressure. Left Atrium: Left atrial size was mildly dilated. Right Atrium: Right atrial size was normal in size. Pericardium: There is no evidence of pericardial effusion. Mitral Valve: The mitral valve is grossly normal. Trivial mitral valve regurgitation. Tricuspid Valve: The tricuspid valve is grossly normal. Tricuspid valve regurgitation is trivial. Aortic Valve: The aortic valve is tricuspid. Aortic valve regurgitation is not visualized. Mild aortic valve annular calcification. Pulmonic Valve: The pulmonic valve was grossly normal. Pulmonic valve regurgitation is trivial. Aorta: The aortic root is normal in size and structure. Venous: The inferior  vena cava is normal in size with greater than 50% respiratory variability, suggesting right atrial pressure of 3 mmHg. IAS/Shunts: The atrial septum is grossly normal.  LEFT VENTRICLE PLAX 2D LVIDd:         4.25 cm  Diastology LVIDs:         2.23 cm  LV e' lateral:   8.38 cm/s LV PW:         0.98 cm  LV E/e' lateral: 9.4 LV IVS:        1.01 cm  LV e' medial:    5.66 cm/s LVOT diam:     2.00 cm  LV E/e' medial:  13.9 LV SV:         91 LV SV Index:   52 LVOT Area:     3.14 cm  RIGHT VENTRICLE RV S prime:     13.80 cm/s TAPSE (M-mode): 2.2 cm LEFT ATRIUM             Index       RIGHT ATRIUM           Index LA diam:        4.20 cm 2.39 cm/m  RA Area:     15.60 cm LA Vol (A2C):   73.9 ml 42.10 ml/m RA Volume:   38.70 ml  22.04 ml/m LA Vol (A4C):   57.7 ml 32.87 ml/m LA Biplane Vol: 66.0 ml 37.60 ml/m  AORTIC VALVE LVOT Vmax:   135.00 cm/s LVOT Vmean:  85.900 cm/s LVOT VTI:    0.290 m  AORTA Ao Root diam: 3.10 cm MITRAL VALVE MV Area (PHT): 2.58 cm     SHUNTS MV Decel Time: 294 msec     Systemic VTI:  0.29 m MV E velocity: 78.60 cm/s   Systemic Diam: 2.00 cm MV A velocity: 112.00 cm/s MV E/A ratio:  0.70 Rozann Lesches MD Electronically signed by Rozann Lesches MD Signature Date/Time: 11/23/2019/2:06:32 PM    Final    CT HEAD CODE STROKE WO CONTRAST  Result Date: 11/22/2019 CLINICAL DATA:  Code stroke.  Aphasia.  Last seen normal 1750 hours. EXAM: CT  HEAD WITHOUT CONTRAST TECHNIQUE: Contiguous axial images were obtained from the base of the skull through the vertex without intravenous contrast. COMPARISON:  None. FINDINGS: Brain: Age related volume loss. No sign of old or acute focal infarction, mass lesion, hemorrhage, hydrocephalus or extra-axial collection. Vascular: There is atherosclerotic calcification of the major vessels at the base of the brain. Skull: Negative Sinuses/Orbits: Clear/normal Other: None ASPECTS ([place] Stroke Program Early CT Score) - Ganglionic level infarction (caudate, lentiform  nuclei, internal capsule, insula, M1-M3 cortex): 7 - Supraganglionic infarction (M4-M6 cortex): 3 Total score (0-10 with 10 being normal): 10 IMPRESSION: 1. No acute or focal finding.  Normal for age. 2. ASPECTS is 10. 3. These results were called by telephone at the time of interpretation on 11/22/2019 at 8:06 pm to provider [facility] , who verbally acknowledged these results. Electronically Signed   By: Nelson Chimes M.D.   On: 11/22/2019 20:06     Subjective: Pt reports she wants to get home.  She denies any recurrence of her symptoms of speech difficulty no difficulty with word finding, etc.   Discharge Exam: Vitals:   11/23/19 1000 11/23/19 1359  BP: (!) 147/71 133/75  Pulse: 66 62  Resp: 16 16  Temp:    SpO2: 97% 97%   Vitals:   11/23/19 0505 11/23/19 0700 11/23/19 1000 11/23/19 1359  BP: (!) 144/85 (!) 143/52 (!) 147/71 133/75  Pulse: (!) 57 (!) 53 66 62  Resp: 16 18 16 16   Temp: 97.7 F (36.5 C) 98 F (36.7 C)    TempSrc: Oral Oral    SpO2: 96% 97% 97% 97%  Weight:      Height:       General: Pt is alert, awake, not in acute distress Cardiovascular: RRR, S1/S2 +, no rubs, no gallops Respiratory: CTA bilaterally, no wheezing, no rhonchi Abdominal: Soft, NT, ND, bowel sounds + Extremities: no edema, no cyanosis Neurological:  nonfocal exam.    The results of significant diagnostics from this hospitalization (including imaging, microbiology, ancillary and laboratory) are listed below for reference.     Microbiology: Recent Results (from the past 240 hour(s))  SARS Coronavirus 2 by RT PCR (hospital order, performed in [facility] hospital lab) Nasopharyngeal Nasopharyngeal Swab     Status: None   Collection Time: 11/22/19  8:34 PM   Specimen: Nasopharyngeal Swab  Result Value Ref Range Status   SARS Coronavirus 2 NEGATIVE NEGATIVE Final    Comment: (NOTE) SARS-CoV-2 target nucleic acids are NOT DETECTED.  The SARS-CoV-2 RNA is generally detectable in upper and  lower respiratory specimens during the acute phase of infection. The lowest concentration of SARS-CoV-2 viral copies this assay can detect is 250 copies / mL. A negative result does not preclude SARS-CoV-2 infection and should not be used as the sole basis for treatment or other patient management decisions.  A negative result may occur with improper specimen collection / handling, submission of specimen other than nasopharyngeal swab, presence of viral mutation(s) within the areas targeted by this assay, and inadequate number of viral copies (<250 copies / mL). A negative result must be combined with clinical observations, patient history, and epidemiological information.  Fact Sheet for Patients:   StrictlyIdeas.no  Fact Sheet for Healthcare Providers: BankingDealers.co.za  This test is not yet approved or  cleared by the [place] FDA and has been authorized for detection and/or diagnosis of SARS-CoV-2 by FDA under an Emergency Use Authorization (EUA).  This EUA will remain in effect (meaning this  test can be used) for the duration of the COVID-19 declaration under Section 564(b)(1) of the Act, 21 U.S.C. section 360bbb-3(b)(1), unless the authorization is terminated or revoked sooner.  Performed at [facility], [address] [place], [place] [address]      Labs: BNP (last 3 results) No results for input(s): BNP in the last 8760 hours. Basic Metabolic Panel: Recent Labs  Lab 11/22/19 2005 11/22/19 2019 11/22/19 2022 11/23/19 0451  NA 139 141  --  137  K 3.9 3.9  --  3.8  CL 103 104  --  104  CO2 24  --   --  25  GLUCOSE 144* 141*  --  146*  BUN 22 22  --  18  CREATININE 0.95 1.00 1.00 0.79  CALCIUM 9.4  --   --  9.2   Liver Function Tests: Recent Labs  Lab 11/22/19 2005 11/23/19 0451  AST 18 16  ALT 16 12  ALKPHOS 73 64  BILITOT 0.3 0.4  PROT 7.8 6.7  ALBUMIN 4.4 3.7   No results for input(s): LIPASE,  AMYLASE in the last 168 hours. No results for input(s): AMMONIA in the last 168 hours. CBC: Recent Labs  Lab 11/22/19 2005 11/22/19 2019 11/23/19 0451  WBC 6.4  --  7.0  NEUTROABS 3.3  --   --   HGB 10.1* 11.2* 9.3*  HCT 33.3* 33.0* 30.8*  MCV 88.1  --  87.7  PLT 325  --  283   Cardiac Enzymes: Recent Labs  Lab 11/22/19 2010  CKTOTAL 138  CKMB 3.9   BNP: Invalid input(s): POCBNP CBG: Recent Labs  Lab 11/22/19 2008 11/22/19 2328 11/23/19 0731 11/23/19 1058  GLUCAP 155* 144* 130* 261*   D-Dimer No results for input(s): DDIMER in the last 72 hours. Hgb A1c Recent Labs    11/22/19 2010  HGBA1C 7.8*   Lipid Profile No results for input(s): CHOL, HDL, LDLCALC, TRIG, CHOLHDL, LDLDIRECT in the last 72 hours. Thyroid function studies No results for input(s): TSH, T4TOTAL, T3FREE, THYROIDAB in the last 72 hours.  Invalid input(s): FREET3 Anemia work up No results for input(s): VITAMINB12, FOLATE, FERRITIN, TIBC, IRON, RETICCTPCT in the last 72 hours. Urinalysis    Component Value Date/Time   COLORURINE YELLOW 11/23/2019 0133   APPEARANCEUR CLEAR 11/23/2019 0133   LABSPEC 1.016 11/23/2019 0133   PHURINE 5.0 11/23/2019 0133   GLUCOSEU NEGATIVE 11/23/2019 0133   HGBUR NEGATIVE 11/23/2019 0133   BILIRUBINUR NEGATIVE 11/23/2019 0133   BILIRUBINUR small (A) 10/01/2019 1045   KETONESUR NEGATIVE 11/23/2019 0133   PROTEINUR 30 (A) 11/23/2019 0133   UROBILINOGEN 4.0 (A) 10/01/2019 1045   NITRITE NEGATIVE 11/23/2019 0133   LEUKOCYTESUR NEGATIVE 11/23/2019 0133   Sepsis Labs Invalid input(s): PROCALCITONIN,  WBC,  LACTICIDVEN Microbiology Recent Results (from the past 240 hour(s))  SARS Coronavirus 2 by RT PCR (hospital order, performed in [facility] hospital lab) Nasopharyngeal Nasopharyngeal Swab     Status: None   Collection Time: 11/22/19  8:34 PM   Specimen: Nasopharyngeal Swab  Result Value Ref Range Status   SARS Coronavirus 2 NEGATIVE NEGATIVE Final     Comment: (NOTE) SARS-CoV-2 target nucleic acids are NOT DETECTED.  The SARS-CoV-2 RNA is generally detectable in upper and lower respiratory specimens during the acute phase of infection. The lowest concentration of SARS-CoV-2 viral copies this assay can detect is 250 copies / mL. A negative result does not preclude SARS-CoV-2 infection and should not be used as the sole basis  for treatment or other patient management decisions.  A negative result may occur with improper specimen collection / handling, submission of specimen other than nasopharyngeal swab, presence of viral mutation(s) within the areas targeted by this assay, and inadequate number of viral copies (<250 copies / mL). A negative result must be combined with clinical observations, patient history, and epidemiological information.  Fact Sheet for Patients:   StrictlyIdeas.no  Fact Sheet for Healthcare Providers: BankingDealers.co.za  This test is not yet approved or  cleared by the [place] FDA and has been authorized for detection and/or diagnosis of SARS-CoV-2 by FDA under an Emergency Use Authorization (EUA).  This EUA will remain in effect (meaning this test can be used) for the duration of the COVID-19 declaration under Section 564(b)(1) of the Act, 21 U.S.C. section 360bbb-3(b)(1), unless the authorization is terminated or revoked sooner.  Performed at [facility], [address], [place], [place] [address]     Time coordinating discharge:   SIGNED:  Irwin Brakeman, MD  [facility] 11/23/2019, 3:20 PM How to contact the [facility] Attending or Consulting provider [place] or covering provider during after hours [place], for this patient?  1. Check the care team in [facility] and look for a) attending/consulting TRH provider listed and b) the [facility] team listed 2. Log into [email or web address] and use [place]'s universal password to access. If you do not have the  password, please contact the hospital operator. 3. Locate the [facility] provider you are looking for under [facility] and page to a number that you can be directly reached. 4. If you still have difficulty reaching the provider, please page the [facility] (Director on Call) for the Hospitalists listed on amion for assistance.

## 2019-11-23 NOTE — Evaluation (Signed)
Occupational Therapy Evaluation Patient Details Name: Jessica Mclean MRN: [id number] DOB: [birth year] Today's Date: 11/23/2019    History of Present Illness Jessica Mclean is a 74 y.o. female with medical history significant for T2DM, HLD and hypertension who presents to the ED due to sudden onset of garbled speech with difficulty in getting words out that started about 6:30PM, she has since been back to baseline and this was confirmed by daughter at bedside. She denies previous episode of this, but she also complains of burning sensation on urination which has been been going for few days. patient denies fever, chills, CP, SOB, diarrhea or constipation.   Clinical Impression   Pt agreeable to OT evaluation. Pt reports symptoms have resolved. Pt performing ADLs and mobility independently, is at her baseline. Pt with BUE strength WNL, coordination and sensation are intact. No further OT services required at this time.     Follow Up Recommendations  No OT follow up    Equipment Recommendations  None recommended by OT       Precautions / Restrictions Precautions Precautions: None Restrictions Weight Bearing Restrictions: No      Mobility Bed Mobility Overal bed mobility: Independent                Transfers Overall transfer level: Independent Equipment used: None                      ADL either performed or assessed with clinical judgement   ADL Overall ADL's : Modified independent;At baseline                                             Vision Baseline Vision/History: Wears glasses Wears Glasses: At all times Patient Visual Report: No change from baseline Vision Assessment?: No apparent visual deficits Additional Comments: 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            Pertinent Vitals/Pain Pain Assessment: No/denies pain     Hand Dominance Right   Extremity/Trunk Assessment Upper Extremity Assessment Upper Extremity Assessment:  Overall WFL for tasks assessed   Lower Extremity Assessment Lower Extremity Assessment: Defer to PT evaluation   Cervical / Trunk Assessment Cervical / Trunk Assessment: Normal   Communication Communication Communication: No difficulties   Cognition Arousal/Alertness: Awake/alert Behavior During Therapy: WFL for tasks assessed/performed Overall Cognitive Status: Within Functional Limits for tasks assessed                                                Home Living Family/patient expects to be discharged to:: Private residence Living Arrangements: Alone Available Help at Discharge: Family;Available PRN/intermittently Type of Home: House Home Access: Stairs to enter [employer] of Steps: 5 Entrance Stairs-Rails: Right;Left;Can reach both Home Layout: One level     Bathroom Shower/Tub: Teacher, early years/pre: Standard     Home Equipment: Environmental consultant - 2 wheels;Cane - quad;Cane - single point          Prior Functioning/Environment Level of Independence: Independent                  AM-PAC OT "6 Clicks" Daily Activity     Outcome Measure Help from another person eating meals?: None Help from another  person taking care of personal grooming?: None Help from another person toileting, which includes using toliet, bedpan, or urinal?: None Help from another person bathing (including washing, rinsing, drying)?: None Help from another person to put on and taking off regular upper body clothing?: None   6 Click Score: 20   End of Session    Activity Tolerance: Patient tolerated treatment well Patient left: in bed;with call bell/phone within reach  OT Visit Diagnosis: Muscle weakness (generalized) (M62.81)                Time: 1610-9604 OT Time Calculation (min): 16 min Charges:  OT General Charges $OT Visit: 1 Visit OT Evaluation $OT Eval Low Complexity: 1 Low   Guadelupe Sabin, OTR/L  [phone] 11/23/2019, 8:37 AM

## 2019-11-23 NOTE — TOC Initial Note (Signed)
Transition of Care [facility]) - Initial/Assessment Note    Patient Details  Name: Jessica Mclean MRN: [id number] Date of Birth: Oct 05, 1945  [facility] CM/SW Contact:    Jessica Flood, LCSW Phone Number: 11/23/2019, 2:37 PM  Clinical Narrative:                  Pt admitted observation status from home. Pt status discussed with MD in [place] this AM. PT/OT evaluated pt and no follow up recommendations made. MD indicated that pt's daughter asking about pt getting Esmeralda RN at [name] for education and medication management assistance. Met with pt this afternoon to assess. Discussed HH RN option and pt stated that [name] had discussed with her. Pt reports she is agreeable "as long as it doesn't cost me anything". Explained that once a [name] agency receives the referral, they will contact her to discuss insurance coverage and schedule visits. Pt agreeable. Discussed CMS provider options and pt agreeable to referral to anyone that will take her insurance.  Arranged the [facility] with [name]. Anticipating dc later today or tomorrow. No other TOC needs identified at this time.  Expected Discharge Plan: [place] Barriers to Discharge: Continued Medical Work up   Patient Goals and CMS Choice Patient states their goals for this hospitalization and ongoing recovery are:: "I want to go home" CMS Medicare.gov Compare Post Acute Care list provided to:: Patient Choice offered to / list presented to : Patient  Expected Discharge Plan and Services Expected Discharge Plan: [place] In-house Referral: Clinical Social Work   Post Acute Care Choice: [place] arrangements for the past 2 months: [place]: RN [name] Agency: [name] ([place]) Date [place]: 11/23/19 Time [place]: 80 Representative spoke with at [place]: [place]  Arrangements/Services Living arrangements for the past 2 months: [place] with:: Self Patient language and need for interpreter reviewed:: Yes Do you feel safe going back to the place where you live?: Yes      Need for Family Participation in Patient Care: No (Comment) Care giver support system in place?: No (comment)   Criminal Activity/Legal Involvement Pertinent to Current Situation/Hospitalization: No - Comment as needed  Activities of Daily Living Home Assistive Devices/Equipment: Eyeglasses, Dentures (specify type) ADL Screening (condition at time of admission) Patient's cognitive ability adequate to safely complete daily activities?: Yes Is the patient deaf or have difficulty hearing?: No Does the patient have difficulty seeing, even when wearing glasses/contacts?: No Does the patient have difficulty concentrating, remembering, or making decisions?: No Patient able to express need for assistance with ADLs?: No Does the patient have difficulty dressing or bathing?: No Independently performs ADLs?: Yes (appropriate for developmental age) Does the patient have difficulty walking or climbing stairs?: No Weakness of Legs: None Weakness of Arms/Hands: None  Permission Sought/Granted Permission sought to share information with : Chartered certified accountant granted to share information with : Yes, Verbal Permission Granted     Permission granted to share info w AGENCY: Advanced        Emotional Assessment Appearance:: Appears younger than stated age Attitude/Demeanor/Rapport: Engaged Affect (typically observed): Pleasant Orientation: : Oriented to Self, Oriented to Place, Oriented to  Time, Oriented to Situation Alcohol / Substance Use:  Not Applicable Psych Involvement: No (comment)  Admission diagnosis:  TIA (transient ischemic attack) [G45.9] Patient Active Problem List   Diagnosis Date Noted  . Essential hypertension 11/23/2019  . UTI  (urinary tract infection) 11/23/2019  . Hyperglycemia 11/23/2019  . Type 2 diabetes mellitus with hyperlipidemia ([place]) 11/23/2019  . TIA (transient ischemic attack) 11/22/2019  . Diabetes mellitus without complication ([place]) 20/74/0979   PCP:  Jani Gravel, MD Pharmacy:   [facility], [address] [place] [address] Phone: [phone] Fax: [phone]     Social Determinants of Health (SDOH) Interventions    Readmission Risk Interventions No flowsheet data found.

## 2019-11-23 NOTE — Progress Notes (Signed)
Nsg Discharge Note  Admit Date:  11/22/2019 Discharge date: 11/23/2019   Jessica Mclean to be D/C'd Home per MD order.  AVS completed.  Copy for chart, and copy for patient signed, and dated. Patient/caregiver able to verbalize understanding.  Discharge Medication: Allergies as of 11/23/2019      Reactions   Other    Nickel staples      Medication List    STOP taking these medications   aspirin-acetaminophen-caffeine 250-250-65 MG tablet Commonly known as: EXCEDRIN MIGRAINE   nitrofurantoin (macrocrystal-monohydrate) 100 MG capsule Commonly known as: MACROBID   predniSONE 10 MG (21) Tbpk tablet Commonly known as: STERAPRED UNI-PAK 21 TAB     TAKE these medications   amLODipine 10 MG tablet Commonly known as: NORVASC Take 10 mg by mouth daily.   aspirin 325 MG EC tablet Take 1 tablet (325 mg total) by mouth daily. Swallow whole. Start taking on: November 24, 2019   atorvastatin 80 MG tablet Commonly known as: LIPITOR Take 1 tablet (80 mg total) by mouth daily. What changed:   medication strength  how much to take  Another medication with the same name was removed. Continue taking this medication, and follow the directions you see here.   Calcium 500+D High Potency 500-400 MG-UNIT tablet Generic drug: calcium-vitamin D Take 1 tablet by mouth daily.   desonide 0.05 % ointment Commonly known as: DESOWEN Apply 1 application topically 2 (two) times daily. What changed:   when to take this  reasons to take this   gabapentin 100 MG capsule Commonly known as: NEURONTIN Take 100 mg by mouth at bedtime.   insulin regular 100 units/mL injection Commonly known as: NOVOLIN R Inject 10-15 Units into the skin 2 (two) times daily before a meal. As directed by sliding scale-new chart by MD   Januvia 50 MG tablet Generic drug: sitaGLIPtin Take 50 mg by mouth daily.   losartan 50 MG tablet Commonly known as: COZAAR Take 50 mg by mouth daily.   meloxicam 15 MG  tablet Commonly known as: MOBIC Take 1 tablet (15 mg total) by mouth daily as needed for pain. What changed:   when to take this  reasons to take this   metFORMIN 1000 MG tablet Commonly known as: GLUCOPHAGE Take 1,000 mg by mouth 2 (two) times daily with a meal.   MiraLax 17 GM/SCOOP powder Generic drug: polyethylene glycol powder Take 17 g by mouth daily as needed for mild constipation or moderate constipation.   omega-3 acid ethyl esters 1 g capsule Commonly known as: LOVAZA Take 2 g by mouth 2 (two) times daily.   omeprazole 20 MG capsule Commonly known as: PriLOSEC Take 1 capsule (20 mg total) by mouth daily.       Discharge Assessment: Vitals:   11/23/19 1000 11/23/19 1359  BP: (!) 147/71 133/75  Pulse: 66 62  Resp: 16 16  Temp:    SpO2: 97% 97%   Skin clean, dry and intact without evidence of skin break down, no evidence of skin tears noted. IV catheter discontinued intact. Site without signs and symptoms of complications - no redness or edema noted at insertion site, patient denies c/o pain - only slight tenderness at site.  Dressing with slight pressure applied.  D/c Instructions-Education: Discharge instructions given to patient/family with verbalized understanding. D/c education completed with patient/family including follow up instructions, medication list, d/c activities limitations if indicated, with other d/c instructions as indicated by MD - patient able to verbalize understanding, all questions  fully answered. Patient instructed to return to ED, call 911, or call MD for any changes in condition.  Patient escorted via [place], and D/C home via private auto.  Dorcas Mcmurray, LPN 01/22/8440 7:12 PM

## 2019-11-24 ENCOUNTER — Other Ambulatory Visit (HOSPITAL_COMMUNITY): Payer: Self-pay | Admitting: Adult Health

## 2019-11-24 DIAGNOSIS — Z1231 Encounter for screening mammogram for malignant neoplasm of breast: Secondary | ICD-10-CM

## 2019-11-24 LAB — URINE CULTURE: Culture: <10000 — AB

## 2019-11-28 ENCOUNTER — Encounter: Payer: Self-pay | Admitting: *Deleted

## 2019-12-11 ENCOUNTER — Emergency Department (HOSPITAL_COMMUNITY)
Admission: EM | Admit: 2019-12-11 | Discharge: 2019-12-11 | Disposition: A | Discharge status: Home / Self Care | Payer: Medicare Other | Attending: Emergency Medicine | Admitting: Emergency Medicine

## 2019-12-11 ENCOUNTER — Emergency Department (HOSPITAL_COMMUNITY): Payer: Medicare Other

## 2019-12-11 ENCOUNTER — Encounter (HOSPITAL_COMMUNITY): Payer: Self-pay | Admitting: *Deleted

## 2019-12-11 ENCOUNTER — Other Ambulatory Visit: Payer: Self-pay

## 2019-12-11 DIAGNOSIS — I10 Essential (primary) hypertension: Secondary | ICD-10-CM | POA: Insufficient documentation

## 2019-12-11 DIAGNOSIS — Y92410 Unspecified street and highway as the place of occurrence of the external cause: Secondary | ICD-10-CM | POA: Diagnosis not present

## 2019-12-11 DIAGNOSIS — Y93I9 Activity, other involving external motion: Secondary | ICD-10-CM | POA: Diagnosis not present

## 2019-12-11 DIAGNOSIS — S161XXA Strain of muscle, fascia and tendon at neck level, initial encounter: Secondary | ICD-10-CM

## 2019-12-11 DIAGNOSIS — M542 Cervicalgia: Secondary | ICD-10-CM | POA: Diagnosis present

## 2019-12-11 DIAGNOSIS — R0789 Other chest pain: Secondary | ICD-10-CM | POA: Insufficient documentation

## 2019-12-11 DIAGNOSIS — D649 Anemia, unspecified: Secondary | ICD-10-CM | POA: Insufficient documentation

## 2019-12-11 DIAGNOSIS — E119 Type 2 diabetes mellitus without complications: Secondary | ICD-10-CM | POA: Insufficient documentation

## 2019-12-11 DIAGNOSIS — R519 Headache, unspecified: Secondary | ICD-10-CM | POA: Diagnosis not present

## 2019-12-11 DIAGNOSIS — R1010 Upper abdominal pain, unspecified: Secondary | ICD-10-CM | POA: Insufficient documentation

## 2019-12-11 DIAGNOSIS — Y999 Unspecified external cause status: Secondary | ICD-10-CM | POA: Insufficient documentation

## 2019-12-11 LAB — COMPREHENSIVE METABOLIC PANEL WITH GFR
ALT: 25 U/L (ref 0–44)
AST: 26 U/L (ref 15–41)
Albumin: 3.7 g/dL (ref 3.5–5.0)
Alkaline Phosphatase: 72 U/L (ref 38–126)
Anion gap: 12 (ref 5–15)
BUN: 13 mg/dL (ref 8–23)
CO2: 24 mmol/L (ref 22–32)
Calcium: 9.6 mg/dL (ref 8.9–10.3)
Chloride: 101 mmol/L (ref 98–111)
Creatinine, Ser: 0.94 mg/dL (ref 0.44–1.00)
GFR calc Af Amer: >60 mL/min
GFR calc non Af Amer: 60 mL/min — ABNORMAL LOW
Glucose, Bld: 171 mg/dL — ABNORMAL HIGH (ref 70–99)
Potassium: 4.8 mmol/L (ref 3.5–5.1)
Sodium: 137 mmol/L (ref 135–145)
Total Bilirubin: 0.7 mg/dL (ref 0.3–1.2)
Total Protein: 6.8 g/dL (ref 6.5–8.1)

## 2019-12-11 LAB — ETHANOL: Alcohol, Ethyl (B): <10 mg/dL (ref <10)

## 2019-12-11 LAB — CBC
HCT: 32.8 % — ABNORMAL LOW (ref 36.0–46.0)
Hemoglobin: 10 g/dL — ABNORMAL LOW (ref 12.0–15.0)
MCH: 25.8 pg — ABNORMAL LOW (ref 26.0–34.0)
MCHC: 30.5 g/dL (ref 30.0–36.0)
MCV: 84.8 fL (ref 80.0–100.0)
Platelets: 297 10*3/uL (ref 150–400)
RBC: 3.87 MIL/uL (ref 3.87–5.11)
RDW: 16 % — ABNORMAL HIGH (ref 11.5–15.5)
WBC: 5.7 10*3/uL (ref 4.0–10.5)
nRBC: 0 % (ref 0.0–0.2)

## 2019-12-11 LAB — I-STAT CHEM 8, ED
BUN: 14 mg/dL (ref 8–23)
Calcium, Ion: 1.17 mmol/L (ref 1.15–1.40)
Chloride: 101 mmol/L (ref 98–111)
Creatinine, Ser: 0.9 mg/dL (ref 0.44–1.00)
Glucose, Bld: 174 mg/dL — ABNORMAL HIGH (ref 70–99)
HCT: 34 % — ABNORMAL LOW (ref 36.0–46.0)
Hemoglobin: 11.6 g/dL — ABNORMAL LOW (ref 12.0–15.0)
Potassium: 4.8 mmol/L (ref 3.5–5.1)
Sodium: 138 mmol/L (ref 135–145)
TCO2: 24 mmol/L (ref 22–32)

## 2019-12-11 LAB — LACTIC ACID, PLASMA: Lactic Acid, Venous: 2.2 mmol/L (ref 0.5–1.9)

## 2019-12-11 LAB — PROTIME-INR
INR: 1 (ref 0.8–1.2)
Prothrombin Time: 12.4 seconds (ref 11.4–15.2)

## 2019-12-11 MED ORDER — HYDROCODONE-ACETAMINOPHEN 5-325 MG PO TABS: 1.0000 | ORAL_TABLET | Freq: Four times a day (QID) | ORAL | 0 refills | Status: DC | PRN

## 2019-12-11 MED ORDER — HYDROCODONE-ACETAMINOPHEN 5-325 MG PO TABS
1.0000 | ORAL_TABLET | ORAL | Status: AC
  Administered 2019-12-11: 1 via ORAL
  Filled 2019-12-11: qty 1

## 2019-12-11 MED ORDER — IOHEXOL 300 MG/ML  SOLN
100.0000 mL | Freq: Once | INTRAMUSCULAR | Status: AC | PRN
  Administered 2019-12-11: 100 mL via INTRAVENOUS

## 2019-12-11 NOTE — ED Notes (Signed)
Law Enforcement at bedside.

## 2019-12-11 NOTE — Progress Notes (Signed)
Orthopedic Tech Progress Note Patient Details:  Jessica Mclean 1945/11/03 [id number] Level 2 Trauma not needed. Patient ID: Jessica Mclean, female   DOB: 1945/06/04, 74 y.o.   MRN: [id number]   [name] 12/11/2019, 4:58 PM

## 2019-12-11 NOTE — ED Notes (Signed)
Discharge instructions discussed with pt. And daughter at bedside. Pt verbalized instructions with no questions at this time.

## 2019-12-11 NOTE — Discharge Instructions (Signed)
You can take over-the-counter Tylenol as needed for pain.  Take the hydrocodone for more severe pain.  Make sure to not exceed 4 g of Tylenol per 24 hours.
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
## 2019-12-12 ENCOUNTER — Encounter (HOSPITAL_COMMUNITY): Payer: Self-pay | Admitting: Internal Medicine

## 2019-12-12 LAB — SAMPLE TO BLOOD BANK

## 2019-12-28 ENCOUNTER — Other Ambulatory Visit: Payer: Self-pay

## 2019-12-28 ENCOUNTER — Ambulatory Visit (HOSPITAL_COMMUNITY)
Admission: RE | Admit: 2019-12-28 | Discharge: 2019-12-28 | Disposition: A | Discharge status: Home / Self Care | Payer: Medicare Other | Source: Ambulatory Visit | Attending: Adult Health | Admitting: Adult Health

## 2019-12-28 DIAGNOSIS — Z1231 Encounter for screening mammogram for malignant neoplasm of breast: Secondary | ICD-10-CM | POA: Insufficient documentation

## 2020-01-25 ENCOUNTER — Ambulatory Visit: Payer: Medicare Other | Admitting: Neurology

## 2020-01-30 ENCOUNTER — Ambulatory Visit: Payer: Medicare Other

## 2020-03-08 ENCOUNTER — Ambulatory Visit (INDEPENDENT_AMBULATORY_CARE_PROVIDER_SITE_OTHER): Payer: Medicare Other | Admitting: Internal Medicine

## 2020-03-08 ENCOUNTER — Other Ambulatory Visit: Payer: Self-pay

## 2020-03-08 ENCOUNTER — Encounter: Payer: Self-pay | Admitting: Internal Medicine

## 2020-03-08 VITALS — BP 161/80 | HR 78 | Temp 97.6°F | Resp 16 | Ht 64.5 in | Wt 159.0 lb

## 2020-03-08 DIAGNOSIS — Z0100 Encounter for examination of eyes and vision without abnormal findings: Secondary | ICD-10-CM

## 2020-03-08 DIAGNOSIS — G459 Transient cerebral ischemic attack, unspecified: Secondary | ICD-10-CM | POA: Diagnosis not present

## 2020-03-08 DIAGNOSIS — E785 Hyperlipidemia, unspecified: Secondary | ICD-10-CM

## 2020-03-08 DIAGNOSIS — Z780 Asymptomatic menopausal state: Secondary | ICD-10-CM

## 2020-03-08 DIAGNOSIS — N3941 Urge incontinence: Secondary | ICD-10-CM

## 2020-03-08 DIAGNOSIS — E119 Type 2 diabetes mellitus without complications: Secondary | ICD-10-CM

## 2020-03-08 DIAGNOSIS — I10 Essential (primary) hypertension: Secondary | ICD-10-CM

## 2020-03-08 DIAGNOSIS — Z7689 Persons encountering health services in other specified circumstances: Secondary | ICD-10-CM

## 2020-03-08 DIAGNOSIS — E1169 Type 2 diabetes mellitus with other specified complication: Secondary | ICD-10-CM

## 2020-03-08 LAB — POCT URINALYSIS DIP (CLINITEK)
Blood, UA: NEGATIVE
Glucose, UA: NEGATIVE mg/dL
Ketones, POC UA: NEGATIVE mg/dL
Leukocytes, UA: NEGATIVE
Nitrite, UA: NEGATIVE
POC PROTEIN,UA: >=300 — AB
Spec Grav, UA: >=1.03 — AB (ref 1.010–1.025)
Urobilinogen, UA: 0.2 E.U./dL
pH, UA: 5 (ref 5.0–8.0)

## 2020-03-08 MED ORDER — OXYBUTYNIN CHLORIDE ER 10 MG PO TB24: 10.0000 mg | ORAL_TABLET | Freq: Every day | ORAL | 3 refills | Status: DC

## 2020-03-08 NOTE — Progress Notes (Signed)
New Patient Office Visit  Subjective:  Patient ID: Jessica Mclean, female    DOB: October 28, 1945  Age: 74 y.o. 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 S/p radiation presents for establishing care. Patient c/o urinary incontinence for last few months since the radiation. She has constant dribbling of urine and is unable to reach to the bathroom when she decides to go. Patient also c/o lower abdominal discomfort. Patient has been given Cephalexin with no benefit in the past. Patient denies any fever, chills, nausea, vomiting. Patient states that she had cervical cancer, for which she underwent radiation treatment, which was overdosed according to the patient. She had urinary and stool incontinence after that. Patient had colostomy placed after that, which was removed later. She continues to have the urinary incontinence. Patient is willing to help getting the records from [place] where she has radiation treatment.  Patient's BP is 161/80 in the office, but she states that she has not taken her medications yet. Patient denies headache, dizziness, chest pain, dyspnea or palpitations.  Patient had last PAP smear about 4 years ago.  Last Mammography in 12/2019. Last colonoscopy about 5 years ago. Last DEXA scan about 4 years ago.  Patient has had Moderna vaccine for COVID. Needs to receive the booster dose. Patient has had flu vaccine, Pneumonia vaccine and Shingrix vaccine in the past.   Past Medical History:  Diagnosis Date          Cervical cancer ([place]) 2010   Diabetes mellitus without complication ([place])    type 2   Diabetes mellitus without complication ([place])    HLD (hyperlipidemia)    Hypertension    Mental  disorder    depression; suicidal ideation in 2017   Vitamin D deficiency     Past Surgical History:  Procedure Laterality Date   BILATERAL OOPHORECTOMY     CESAREAN SECTION     x 4   CHOLECYSTECTOMY     COLOSTOMY     x2   SALPINGECTOMY     TONSILLECTOMY AND ADENOIDECTOMY      Family History  Problem Relation Age of Onset   Cancer Father        kidney   Cancer Mother        lung   Other Brother        heart issues    Social History   Socioeconomic History   Marital status: Divorced    Spouse name: Not on file   Number of children: Not on file   Years of education: Not on file   Highest education level: Not on file  Occupational History   Not on file  Tobacco Use   Smoking status: Never Smoker   Smokeless tobacco: Never Used  Substance and Sexual Activity   Alcohol use: Never   Drug use: Never   Sexual activity: Not Currently    Birth control/protection: Post-menopausal  Other Topics Concern   Not on file  Social History Narrative   ** Merged History Encounter **       Social Determinants of Health   Financial Resource Strain:    Difficulty of Paying Living Expenses: Not on file  Food  Insecurity:    Worried About Charity fundraiser in the Last Year: Not on file   [employer] of Food in the Last Year: Not on file  Transportation Needs:    Lack of Transportation (Medical): Not on file   Lack of Transportation (Non-Medical): Not on file  Physical Activity:    Days of Exercise per Week: Not on file   Minutes of Exercise per Session: Not on file  Stress:    Feeling of Stress : Not on file  Social Connections:    Frequency of Communication with Friends and Family: Not on file   Frequency of Social Gatherings with Friends and Family: Not on file   Attends Religious Services: Not on file   Active Member of Clubs or Organizations: Not on file   Attends Archivist Meetings: Not on file   Marital Status: Not on file   Intimate Partner Violence:    Fear of Current or Ex-Partner: Not on file   Emotionally Abused: Not on file   Physically Abused: Not on file   Sexually Abused: Not on file    ROS Review of Systems  Constitutional: Negative for chills and fever.  HENT: Negative for congestion, sinus pressure, sinus pain and sore throat.   Eyes: Negative for pain and discharge.  Respiratory: Negative for cough and shortness of breath.   Cardiovascular: Negative for chest pain and palpitations.  Gastrointestinal: Negative for abdominal pain, constipation, diarrhea, nausea and vomiting.  Endocrine: Negative for polydipsia and polyuria.  Genitourinary: Positive for frequency and urgency. Negative for dysuria, flank pain and hematuria.  Musculoskeletal: Negative for neck pain and neck stiffness.  Skin: Negative for rash.  Neurological: Negative for dizziness, syncope, speech difficulty, weakness and numbness.  Psychiatric/Behavioral: Negative for agitation and behavioral problems.    Objective:   Today's Vitals: BP (!) 161/80 (BP Location: Right Arm, Patient Position: Sitting, Cuff Size: Normal)    Pulse 78    Temp 97.6 F (36.4 C) (Temporal)    Resp 16    Ht 5' 4.5" (1.638 m)    Wt 159 lb (72.1 kg)    LMP  (LMP Unknown)    SpO2 97%    BMI 26.87 kg/m   Physical Exam Vitals reviewed.  Constitutional:      General: She is not in acute distress.    Appearance: She is not diaphoretic.  HENT:     Head: Normocephalic and atraumatic.     Nose: Nose normal.     Mouth/Throat:     Mouth: Mucous membranes are moist.  Eyes:     General: No scleral icterus.    Extraocular Movements: Extraocular movements intact.     Pupils: Pupils are equal, round, and reactive to light.  Cardiovascular:     Rate and Rhythm: Normal rate and regular rhythm.     Pulses: Normal pulses.     Heart sounds: Normal heart sounds. No murmur heard.  No friction rub. No gallop.   Pulmonary:     Breath sounds: Normal breath  sounds. No wheezing or rales.  Abdominal:     Palpations: Abdomen is soft.     Tenderness: There is no abdominal tenderness.  Musculoskeletal:     Cervical back: Neck supple. No tenderness.     Right lower leg: No edema.     Left lower leg: No edema.  Skin:    General: Skin is warm.     Findings: Bruising (Left knee) present.  Neurological:  General: No focal deficit present.     Mental Status: She is alert and oriented to person, place, and time.     Sensory: No sensory deficit.     Motor: No weakness.  Psychiatric:        Mood and Affect: Mood normal.        Behavior: Behavior normal.     Assessment & Plan:   Problem List Items Addressed This Visit    Encounter to establish care Care established Previous chart reviewed History and medications reviewed with the patient  Type 2 diabetes mellitus with hyperlipidemia (HCC) HbA1C: 7.8 (11/2019) On Novolin, Metformin and Januvia Takes Novolin according to [facility] referral provided for closer monitoring of insulin treatment May benefit from [facility] and/or GLP-1 instead of Januvia, will discuss it with Endocrinology Advised to follow diabetic diet On ARB and statin F/u CMP and lipid panel Diabetic eye exam: Advised to follow up with Ophthalmology for diabetic eye exam  Essential hypertension BP Readings from Last 1 Encounters:  03/08/20 (!) 161/80   Uncontrolled due to missed dose Losartan 50 mg QD and Amlodipine 10 mg QD Counseled for compliance with the medications Advised DASH diet and moderate exercise/walking, at least 150 mins/week Advised to check BP at home and bring the log in next visit   TIA (transient ischemic attack) H/o TIA in 12/2019 On Aspirin and statin No neurological deficit  Urge incontinence Since the radiation therapy for cervical cancer Started Oxybutynin 10 mg QD    Other Visit Diagnoses    Post-menopausal       Relevant Orders   DG Bone Density   Routine eye exam        Relevant Orders   Ambulatory referral to Ophthalmology      Outpatient Encounter Medications as of 03/08/2020  Medication Sig   ACCU-CHEK AVIVA PLUS test strip    Accu-Chek Softclix Lancets lancets SMARTSIG:Topical   amLODipine (NORVASC) 10 MG tablet Take 10 mg by mouth daily.   atorvastatin (LIPITOR) 80 MG tablet Take 1 tablet (80 mg total) by mouth daily.   calcium-vitamin D (CALCIUM 500+D HIGH POTENCY) 500-400 MG-UNIT tablet Take 1 tablet by mouth daily.   desonide (DESOWEN) 0.05 % ointment Apply 1 application topically 2 (two) times daily. (Patient taking differently: Apply 1 application topically 2 (two) times daily as needed (skin irritation post poison ivy contact). )   gabapentin (NEURONTIN) 100 MG capsule Take 100 mg by mouth at bedtime.    insulin regular (NOVOLIN R,HUMULIN R) 100 units/mL injection Inject 10-15 Units into the skin 2 (two) times daily before a meal. As directed by sliding scale-new chart by MD   Insulin Syringe-Needle U-100 (INSULIN SYRINGE .3CC/31GX5/16") 31G X 5/16" 0.3 ML MISC    JANUVIA 50 MG tablet Take 50 mg by mouth daily.   losartan (COZAAR) 50 MG tablet Take 50 mg by mouth daily.   meloxicam (MOBIC) 15 MG tablet Take 1 tablet (15 mg total) by mouth daily as needed for pain.   metFORMIN (GLUCOPHAGE) 1000 MG tablet Take 1,000 mg by mouth 2 (two) times daily with a meal.    omega-3 acid ethyl esters (LOVAZA) 1 g capsule Take 2 g by mouth 2 (two) times daily.   omeprazole (PRILOSEC) 20 MG capsule Take 1 capsule (20 mg total) by mouth daily.   polyethylene glycol powder (MIRALAX) powder Take 17 g by mouth daily as needed for mild constipation or moderate constipation.    aspirin EC 325 MG EC tablet Take  1 tablet (325 mg total) by mouth daily. Swallow whole. (Patient not taking: Reported on 03/08/2020)   oxybutynin (DITROPAN-XL) 10 MG 24 hr tablet Take 1 tablet (10 mg total) by mouth at bedtime.   [DISCONTINUED] glipiZIDE (GLUCOTROL) 5 MG  tablet Take by mouth daily before breakfast.   [DISCONTINUED] HYDROcodone-acetaminophen (NORCO/VICODIN) 5-325 MG tablet Take 1 tablet by mouth every 6 (six) hours as needed. (Patient not taking: Reported on 03/08/2020)   No facility-administered encounter medications on file as of 03/08/2020.    Follow-up: Return in about 3 months (around 06/08/2020).   Lindell Spar, MD

## 2020-03-08 NOTE — Assessment & Plan Note (Signed)
Care established Previous chart reviewed History and medications reviewed with the patient 

## 2020-03-08 NOTE — Assessment & Plan Note (Signed)
H/o TIA in 12/2019 °On Aspirin and statin °No neurological deficit °

## 2020-03-08 NOTE — Assessment & Plan Note (Signed)
Since the radiation therapy for cervical cancer Started Oxybutynin 10 mg QD

## 2020-03-08 NOTE — Assessment & Plan Note (Addendum)
BP Readings from Last 1 Encounters:  03/08/20 (!) 161/80   Uncontrolled due to missed dose Losartan 50 mg QD and Amlodipine 10 mg QD Counseled for compliance with the medications Advised DASH diet and moderate exercise/walking, at least 150 mins/week Advised to check BP at home and bring the log in next visit

## 2020-03-08 NOTE — Assessment & Plan Note (Addendum)
HbA1C: 7.8 (11/2019) On Novolin, Metformin and Januvia Takes Novolin according to [place] [facility] referral provided for closer monitoring of insulin treatment May benefit from [facility] and/or GLP-1 instead of Januvia, will discuss it with Endocrinology Advised to follow diabetic diet On ARB and statin F/u CMP and lipid panel Diabetic eye exam: Advised to follow up with Ophthalmology for diabetic eye exam

## 2020-03-08 NOTE — Patient Instructions (Addendum)
Please start taking Oxybutynin for urinary incontinence.  You are being referred to Ob./Gyn. for PAP smear.  You are being referred to Endocrinology clinic (Dr. Dorris Fetch) for management of DM and insulin regimen management.  Please schedule an appointment with Ophthalmology for annual diabetic eye exam.  Please continue to take medications for hypertension and DM.  Please follow DASH diet for better control of blood pressure.  DASH stands for Dietary Approaches to Stop Hypertension. The DASH diet is a healthy-eating plan designed to help treat or prevent high blood pressure (hypertension).  The DASH diet includes foods that are rich in potassium, calcium and magnesium. These nutrients help control blood pressure. The diet limits foods that are high in sodium, saturated fat and added sugars.  Studies have shown that the DASH diet can lower blood pressure in as little as two weeks. The diet can also lower low-density lipoprotein (LDL or "bad") cholesterol levels in the blood. High blood pressure and high LDL cholesterol levels are two major risk factors for heart disease and stroke.    DASH diet: Recommended servings The DASH diet provides daily and weekly nutritional goals. The number of servings you should have depends on your daily calorie needs.  Here's a look at the recommended servings from each food group for a 2,000-calorie-a-day DASH diet:  Grains: 6 to 8 servings a day. One serving is one slice bread, 1 ounce dry cereal, or 1/2 cup cooked cereal, rice or pasta. Vegetables: 4 to 5 servings a day. One serving is 1 cup raw leafy green vegetable, 1/2 cup cut-up raw or cooked vegetables, or 1/2 cup vegetable juice. Fruits: 4 to 5 servings a day. One serving is one medium fruit, 1/2 cup fresh, frozen or canned fruit, or 1/2 cup fruit juice. Fat-free or low-fat dairy products: 2 to 3 servings a day. One serving is 1 cup milk or yogurt, or 1 1/2 ounces cheese. Lean meats, poultry and fish:  six 1-ounce servings or fewer a day. One serving is 1 ounce cooked meat, poultry or fish, or 1 egg. Nuts, seeds and legumes: 4 to 5 servings a week. One serving is 1/3 cup nuts, 2 tablespoons peanut butter, 2 tablespoons seeds, or 1/2 cup cooked legumes (dried beans or peas). Fats and oils: 2 to 3 servings a day. One serving is 1 teaspoon soft margarine, 1 teaspoon vegetable oil, 1 tablespoon mayonnaise or 2 tablespoons salad dressing. Sweets and added sugars: 5 servings or fewer a week. One serving is 1 tablespoon sugar, jelly or jam, 1/2 cup sorbet, or 1 cup lemonade.

## 2020-03-09 ENCOUNTER — Other Ambulatory Visit (HOSPITAL_COMMUNITY)
Admission: RE | Admit: 2020-03-09 | Discharge: 2020-03-09 | Disposition: A | Discharge status: Home / Self Care | Payer: Medicare Other | Source: Other Acute Inpatient Hospital | Attending: Internal Medicine | Admitting: Internal Medicine

## 2020-03-09 DIAGNOSIS — N3941 Urge incontinence: Secondary | ICD-10-CM | POA: Insufficient documentation

## 2020-03-10 LAB — URINE CULTURE: Culture: NO GROWTH

## 2020-03-15 ENCOUNTER — Other Ambulatory Visit: Payer: Self-pay | Admitting: *Deleted

## 2020-03-16 ENCOUNTER — Other Ambulatory Visit (HOSPITAL_COMMUNITY): Payer: Medicare Other

## 2020-03-19 ENCOUNTER — Other Ambulatory Visit: Payer: Self-pay

## 2020-03-19 DIAGNOSIS — E785 Hyperlipidemia, unspecified: Secondary | ICD-10-CM

## 2020-03-19 DIAGNOSIS — E1169 Type 2 diabetes mellitus with other specified complication: Secondary | ICD-10-CM

## 2020-03-19 MED ORDER — JANUVIA 50 MG PO TABS: 50.0000 mg | ORAL_TABLET | Freq: Every day | ORAL | 0 refills | Status: DC

## 2020-03-19 MED ORDER — GABAPENTIN 100 MG PO CAPS: 100.0000 mg | ORAL_CAPSULE | Freq: Every day | ORAL | 0 refills | Status: DC

## 2020-03-19 MED ORDER — "INSULIN SYRINGE 31G X 5/16"" 0.3 ML MISC": 0 refills | Status: DC

## 2020-03-19 MED ORDER — ATORVASTATIN CALCIUM 80 MG PO TABS: 80.0000 mg | ORAL_TABLET | Freq: Every day | ORAL | 1 refills | Status: DC

## 2020-03-19 MED ORDER — METFORMIN HCL 1000 MG PO TABS: 1000.0000 mg | ORAL_TABLET | Freq: Two times a day (BID) | ORAL | 0 refills | Status: DC

## 2020-03-21 ENCOUNTER — Encounter: Payer: Self-pay | Admitting: Internal Medicine

## 2020-03-21 ENCOUNTER — Telehealth (INDEPENDENT_AMBULATORY_CARE_PROVIDER_SITE_OTHER): Payer: Medicare Other | Admitting: Internal Medicine

## 2020-03-21 DIAGNOSIS — N3941 Urge incontinence: Secondary | ICD-10-CM | POA: Diagnosis not present

## 2020-03-21 DIAGNOSIS — N3000 Acute cystitis without hematuria: Secondary | ICD-10-CM

## 2020-03-21 MED ORDER — SULFAMETHOXAZOLE-TRIMETHOPRIM 400-80 MG PO TABS: 1.0000 | ORAL_TABLET | Freq: Two times a day (BID) | ORAL | 7 refills | Status: DC

## 2020-03-21 MED ORDER — SULFAMETHOXAZOLE-TRIMETHOPRIM 800-160 MG PO TABS: 1.0000 | ORAL_TABLET | Freq: Two times a day (BID) | ORAL | 0 refills | Status: DC

## 2020-03-21 NOTE — Progress Notes (Addendum)
Virtual Visit via Telephone Note   This visit type was conducted due to national recommendations for restrictions regarding the COVID-19 Pandemic (e.g. social distancing) in an effort to limit this patient's exposure and mitigate transmission in our community.  Due to her co-morbid illnesses, this patient is at least at moderate risk for complications without adequate follow up.  This format is felt to be most appropriate for this patient at this time.  The patient did not have access to video technology/had technical difficulties with video requiring transitioning to audio format only (telephone).  All issues noted in this document were discussed and addressed.  No physical exam could be performed with this format.  Evaluation Performed:  Follow-up visit  Date:  03/21/2020   ID:  Jessica Mclean, DOB Feb 16, 1946, MRN [id number]  Patient Location: Home Provider Location: Home Office  Location of Patient: Home Location of Provider: Telehealth Consent was obtain for visit to be over via telehealth. I verified that I am speaking with the correct person using two identifiers.  PCP:  Lindell Spar, MD   Chief Complaint: Dysuria, urinary incontinence  History of Present Illness:    Jessica Mclean is a 74 y.o. female with past medical history of TIA, hypertension, type II DM and cervical cancer s/p radiotherapy is a televisit to discuss about dysuria and urinary incontinence.  Patient was recently started on oxybutynin for urge incontinence.  She states that her frequency had been better, but she started to have dysuria for last 3 days. She stopped taking oxybutynin and has started taking cephalexin from old prescription.  She has noticed mild relief with dysuria with it.  UA in the office in the previous visit was negative for LE and nitrite.  Patient denies fever, chills, nausea, vomiting, abdominal pain, hematuria or hesitancy.  Patient requests referral to urology for evaluation of urinary  incontinence.   The patient does not have symptoms concerning for COVID-19 infection (fever, chills, cough, or new shortness of breath).   Past Medical, Surgical, Social History, Allergies, and Medications have been Reviewed.  Past Medical History:  Diagnosis Date  . Cervical cancer ([place]) 2010  . Diabetes mellitus without complication ([place])    type 2  . Diabetes mellitus without complication ([place])   . HLD (hyperlipidemia)   . Hypertension   . Mental disorder    depression; suicidal ideation in 2017  . Vitamin D deficiency    Past Surgical History:  Procedure Laterality Date  . BILATERAL OOPHORECTOMY    . CESAREAN SECTION     x 4  . CHOLECYSTECTOMY    . COLOSTOMY     x2  . SALPINGECTOMY    . TONSILLECTOMY AND ADENOIDECTOMY       Current Meds  Medication Sig  . ACCU-CHEK AVIVA PLUS test strip   . Accu-Chek Softclix Lancets lancets SMARTSIG:Topical  . amLODipine (NORVASC) 10 MG tablet Take 10 mg by mouth daily.  Marland Kitchen aspirin EC 325 MG EC tablet Take 1 tablet (325 mg total) by mouth daily. Swallow whole.  Marland Kitchen atorvastatin (LIPITOR) 80 MG tablet Take 1 tablet (80 mg total) by mouth daily.  . calcium-vitamin D (CALCIUM 500+D HIGH POTENCY) 500-400 MG-UNIT tablet Take 1 tablet by mouth daily.  Marland Kitchen desonide (DESOWEN) 0.05 % ointment Apply 1 application topically 2 (two) times daily. (Patient taking differently: Apply 1 application topically 2 (two) times daily as needed (skin irritation post poison ivy contact). )  . gabapentin (NEURONTIN) 100 MG capsule Take 1 capsule (  100 mg total) by mouth at bedtime.  . insulin regular (NOVOLIN R,HUMULIN R) 100 units/mL injection Inject 10-15 Units into the skin 2 (two) times daily before a meal. As directed by sliding scale-new chart by MD  . Insulin Syringe-Needle U-100 (INSULIN SYRINGE .3CC/31GX5/16") 31G X 5/16" 0.3 ML MISC Use as directed.  Marland Kitchen JANUVIA 50 MG tablet Take 1 tablet (50 mg total) by mouth daily.  Marland Kitchen losartan (COZAAR) 50 MG tablet Take  50 mg by mouth daily.  . meloxicam (MOBIC) 15 MG tablet Take 1 tablet (15 mg total) by mouth daily as needed for pain.  . metFORMIN (GLUCOPHAGE) 1000 MG tablet Take 1 tablet (1,000 mg total) by mouth 2 (two) times daily with a meal.  . omega-3 acid ethyl esters (LOVAZA) 1 g capsule Take 2 g by mouth 2 (two) times daily.  Marland Kitchen omeprazole (PRILOSEC) 20 MG capsule Take 1 capsule (20 mg total) by mouth daily.  Marland Kitchen oxybutynin (DITROPAN-XL) 10 MG 24 hr tablet Take 1 tablet (10 mg total) by mouth at bedtime.  . polyethylene glycol powder (MIRALAX) powder Take 17 g by mouth daily as needed for mild constipation or moderate constipation.      Allergies:   Other   ROS:   Review of Systems  Constitutional: Negative for chills and fever.  HENT: Negative for congestion, sinus pressure, sinus pain and sore throat.   Eyes: Negative for pain and discharge.  Respiratory: Negative for cough and shortness of breath.   Cardiovascular: Negative for chest pain and palpitations.  Gastrointestinal: Negative for abdominal pain, constipation, diarrhea, nausea and vomiting.  Endocrine: Negative for polydipsia and polyuria.  Genitourinary: Positive for dysuria, frequency, and urgency. Negative for flank pain and hematuria. Musculoskeletal: Negative for neck pain and neck stiffness.  Skin: Negative for rash.  Neurological: Negative for dizziness, syncope, speech difficulty, weakness and numbness.  Psychiatric/Behavioral: Negative for agitation and behavioral problems.    Labs/Other Tests and Data Reviewed:    Recent Labs: 12/11/2019: ALT 25; BUN 14; Creatinine, Ser 0.90; Hemoglobin 11.6; Platelets 297; Potassium 4.8; Sodium 138   Recent Lipid Panel Lab Results  Component Value Date/Time   CHOL 194 11/23/2019 03:04 PM   TRIG 291 (H) 11/23/2019 03:04 PM   HDL 34 (L) 11/23/2019 03:04 PM   CHOLHDL 5.7 11/23/2019 03:04 PM   LDLCALC 102 (H) 11/23/2019 03:04 PM    Wt Readings from Last 3 Encounters:  03/08/20 159  lb (72.1 kg)  12/11/19 160 lb (72.6 kg)  11/22/19 155 lb (70.3 kg)      ASSESSMENT & PLAN:    UTI Complains of dysuria, mildly better with antibiotic Started Bactrim Advised to increase fluid intake to at least 2 L/day  Urge incontinence Likely related to radiation injury Had started oxybutynin, advised the patient to continue to take it Urology referral provided as per patient's request  Time:   Today, I have spent 14 minutes with the patient with telehealth technology discussing the above problems.     Medication Adjustments/Labs and Tests Ordered: Current medicines are reviewed at length with the patient today.  Concerns regarding medicines are outlined above.   Tests Ordered: Orders Placed This Encounter  Procedures  . Ambulatory referral to Urology    Medication Changes: Meds ordered this encounter  Medications  . DISCONTD: sulfamethoxazole-trimethoprim (BACTRIM) 400-80 MG tablet    Sig: Take 1 tablet by mouth 2 (two) times daily.    Dispense:  14 tablet    Refill:  7  . sulfamethoxazole-trimethoprim (  BACTRIM DS) 800-160 MG tablet    Sig: Take 1 tablet by mouth 2 (two) times daily.    Dispense:  10 tablet    Refill:  0     Note: This dictation was prepared with Dragon dictation along with smaller phrase technology. Similar sounding words can be transcribed inadequately or may not be corrected upon review. Any transcriptional errors that result from this process are unintentional.      Disposition:  Follow up  Signed, Lindell Spar, MD  03/21/2020 3:18 PM     [facility]

## 2020-03-21 NOTE — Patient Instructions (Addendum)
Please start taking Bactrim as prescribed for urinary tract infection.  You are being referred to urology for evaluation of urge incontinence.  Please continue to take oxybutynin as prescribed for urge incontinence. Urinary Tract Infection, Adult A urinary tract infection (UTI) is an infection of any part of the urinary tract. The urinary tract includes:  The kidneys.  The ureters.  The bladder.  The urethra. These organs make, store, and get rid of pee (urine) in the body. What are the causes? This is caused by germs (bacteria) in your genital area. These germs grow and cause swelling (inflammation) of your urinary tract. What increases the risk? You are more likely to develop this condition if:  You have a small, thin tube (catheter) to drain pee.  You cannot control when you pee or poop (incontinence).  You are female, and: ? You use these methods to prevent pregnancy:  A medicine that kills sperm (spermicide).  A device that blocks sperm (diaphragm). ? You have low levels of a female hormone (estrogen). ? You are pregnant.  You have genes that add to your risk.  You are sexually active.  You take antibiotic medicines.  You have trouble peeing because of: ? A prostate that is bigger than normal, if you are female. ? A blockage in the part of your body that drains pee from the bladder (urethra). ? A kidney stone. ? A nerve condition that affects your bladder (neurogenic bladder). ? Not getting enough to drink. ? Not peeing often enough.  You have other conditions, such as: ? Diabetes. ? A weak disease-fighting system (immune system). ? Sickle cell disease. ? Gout. ? Injury of the spine. What are the signs or symptoms? Symptoms of this condition include:  Needing to pee right away (urgently).  Peeing often.  Peeing small amounts often.  Pain or burning when peeing.  Blood in the pee.  Pee that smells bad or not like normal.  Trouble peeing.  Pee  that is cloudy.  Fluid coming from the vagina, if you are female.  Pain in the belly or lower back. Other symptoms include:  Throwing up (vomiting).  No urge to eat.  Feeling mixed up (confused).  Being tired and grouchy (irritable).  A fever.  Watery poop (diarrhea). How is this treated? This condition may be treated with:  Antibiotic medicine.  Other medicines.  Drinking enough water. Follow these instructions at home:  Medicines  Take over-the-counter and prescription medicines only as told by your doctor.  If you were prescribed an antibiotic medicine, take it as told by your doctor. Do not stop taking it even if you start to feel better. General instructions  Make sure you: ? Pee until your bladder is empty. ? Do not hold pee for a long time. ? Empty your bladder after sex. ? Wipe from front to back after pooping if you are a female. Use each tissue one time when you wipe.  Drink enough fluid to keep your pee pale yellow.  Keep all follow-up visits as told by your doctor. This is important. Contact a doctor if:  You do not get better after 1-2 days.  Your symptoms go away and then come back. Get help right away if:  You have very bad back pain.  You have very bad pain in your lower belly.  You have a fever.  You are sick to your stomach (nauseous).  You are throwing up. Summary  A urinary tract infection (UTI) is an infection  of any part of the urinary tract.  This condition is caused by germs in your genital area.  There are many risk factors for a UTI. These include having a small, thin tube to drain pee and not being able to control when you pee or poop.  Treatment includes antibiotic medicines for germs.  Drink enough fluid to keep your pee pale yellow. This information is not intended to replace advice given to you by your health care provider. Make sure you discuss any questions you have with your health care provider. Document  Revised: 04/22/2018 Document Reviewed: 11/12/2017 Elsevier Patient Education  2020 [employer].

## 2020-03-21 NOTE — Addendum Note (Signed)
Addended byIhor Dow on: 03/21/2020 03:19 PM   Modules accepted: Orders

## 2020-03-26 ENCOUNTER — Ambulatory Visit: Payer: Medicare Other | Admitting: Nurse Practitioner

## 2020-03-27 ENCOUNTER — Ambulatory Visit: Payer: Medicare Other | Admitting: "Endocrinology

## 2020-04-09 ENCOUNTER — Ambulatory Visit (INDEPENDENT_AMBULATORY_CARE_PROVIDER_SITE_OTHER): Payer: Medicare Other | Admitting: "Endocrinology

## 2020-04-09 ENCOUNTER — Encounter: Payer: Self-pay | Admitting: "Endocrinology

## 2020-04-09 ENCOUNTER — Other Ambulatory Visit: Payer: Self-pay

## 2020-04-09 VITALS — BP 140/68 | HR 60 | Ht 64.5 in | Wt 161.2 lb

## 2020-04-09 DIAGNOSIS — E1169 Type 2 diabetes mellitus with other specified complication: Secondary | ICD-10-CM | POA: Diagnosis not present

## 2020-04-09 DIAGNOSIS — Z794 Long term (current) use of insulin: Secondary | ICD-10-CM | POA: Diagnosis not present

## 2020-04-09 LAB — POCT GLYCOSYLATED HEMOGLOBIN (HGB A1C): Hemoglobin A1C: 7.9 % — AB (ref 4.0–5.6)

## 2020-04-09 MED ORDER — FREESTYLE LIBRE 2 SENSOR MISC: 1.0000 | 3 refills | Status: DC

## 2020-04-09 MED ORDER — FREESTYLE LIBRE 2 READER DEVI: 0 refills | Status: DC

## 2020-04-09 NOTE — Patient Instructions (Signed)

## 2020-04-09 NOTE — Progress Notes (Signed)
Endocrinology Consult Note       04/09/2020, 6:19 PM   Subjective:    Patient ID: Jessica Mclean, female    DOB: 07-Sep-1945.  Jessica Mclean is being seen in consultation for management of currently uncontrolled symptomatic diabetes requested by  Lindell Spar, MD.   Past Medical History:  Diagnosis Date  . Cervical cancer ([place]) 2010  . Diabetes mellitus without complication ([place])    type 2  . Diabetes mellitus without complication ([place])   . HLD (hyperlipidemia)   . Hypertension   . Mental disorder    depression; suicidal ideation in 2017  . Vitamin D deficiency     Past Surgical History:  Procedure Laterality Date  . BILATERAL OOPHORECTOMY    . CESAREAN SECTION     x 4  . CHOLECYSTECTOMY    . COLOSTOMY     x2  . SALPINGECTOMY    . TONSILLECTOMY AND ADENOIDECTOMY      Social History   Socioeconomic History  . Marital status: Divorced    Spouse name: Not on file  . Number of children: Not on file  . Years of education: Not on file  . Highest education level: Not on file  Occupational History  . Not on file  Tobacco Use  . Smoking status: Never Smoker  . Smokeless tobacco: Never Used  Substance and Sexual Activity  . Alcohol use: Never  . Drug use: Never  . Sexual activity: Not Currently    Birth control/protection: Post-menopausal  Other Topics Concern  . Not on file  Social History Narrative   ** Merged History Encounter **       Social Determinants of Health   Financial Resource Strain:   . Difficulty of Paying Living Expenses: Not on file  Food Insecurity:   . Worried About Charity fundraiser in the Last Year: Not on file  . Ran Out of Food in the Last Year: Not on file  Transportation Needs:   . Lack of Transportation (Medical): Not on file  . Lack of Transportation (Non-Medical): Not on file  Physical Activity:   . Days of Exercise per Week: Not on file  .  Minutes of Exercise per Session: Not on file  Stress:   . Feeling of Stress : Not on file  Social Connections:   . Frequency of Communication with Friends and Family: Not on file  . Frequency of Social Gatherings with Friends and Family: Not on file  . Attends Religious Services: Not on file  . Active Member of Clubs or Organizations: Not on file  . Attends Archivist Meetings: Not on file  . Marital Status: Not on file    Family History  Problem Relation Age of Onset  . Cancer Father        kidney  . Cancer Mother        lung  . Other Brother        heart issues    Outpatient Encounter Medications as of 04/09/2020  Medication Sig  . ACCU-CHEK AVIVA PLUS test strip   . Accu-Chek Softclix Lancets lancets SMARTSIG:Topical  . amLODipine (  NORVASC) 10 MG tablet Take 10 mg by mouth daily.  Marland Kitchen aspirin EC 325 MG EC tablet Take 1 tablet (325 mg total) by mouth daily. Swallow whole.  Marland Kitchen atorvastatin (LIPITOR) 80 MG tablet Take 1 tablet (80 mg total) by mouth daily.  . calcium-vitamin D (CALCIUM 500+D HIGH POTENCY) 500-400 MG-UNIT tablet Take 1 tablet by mouth daily.  . Continuous Blood Gluc Receiver (FREESTYLE LIBRE 2 READER) DEVI As directed  . Continuous Blood Gluc Sensor (FREESTYLE LIBRE 2 SENSOR) MISC 1 Piece by Does not apply route every 14 (fourteen) days.  Marland Kitchen desonide (DESOWEN) 0.05 % ointment Apply 1 application topically 2 (two) times daily. (Patient taking differently: Apply 1 application topically 2 (two) times daily as needed (skin irritation post poison ivy contact). )  . gabapentin (NEURONTIN) 100 MG capsule Take 1 capsule (100 mg total) by mouth at bedtime.  . insulin regular (NOVOLIN R,HUMULIN R) 100 units/mL injection Inject 10 Units into the skin 3 (three) times daily before meals.  . Insulin Syringe-Needle U-100 (INSULIN SYRINGE .3CC/31GX5/16") 31G X 5/16" 0.3 ML MISC Use as directed.  Marland Kitchen JANUVIA 50 MG tablet Take 1 tablet (50 mg total) by mouth daily.  Marland Kitchen losartan  (COZAAR) 50 MG tablet Take 50 mg by mouth daily.  . meloxicam (MOBIC) 15 MG tablet Take 1 tablet (15 mg total) by mouth daily as needed for pain.  . metFORMIN (GLUCOPHAGE) 1000 MG tablet Take 1 tablet (1,000 mg total) by mouth 2 (two) times daily with a meal.  . omega-3 acid ethyl esters (LOVAZA) 1 g capsule Take 2 g by mouth 2 (two) times daily.  Marland Kitchen oxybutynin (DITROPAN-XL) 10 MG 24 hr tablet Take 1 tablet (10 mg total) by mouth at bedtime.  . polyethylene glycol powder (MIRALAX) powder Take 17 g by mouth daily as needed for mild constipation or moderate constipation.   . sulfamethoxazole-trimethoprim (BACTRIM DS) 800-160 MG tablet Take 1 tablet by mouth 2 (two) times daily.  . [DISCONTINUED] glipiZIDE (GLUCOTROL) 5 MG tablet Take by mouth daily before breakfast.  . [DISCONTINUED] omeprazole (PRILOSEC) 20 MG capsule Take 1 capsule (20 mg total) by mouth daily.   No facility-administered encounter medications on file as of 04/09/2020.    ALLERGIES: Allergies  Allergen Reactions  . Other     Nickel staples    VACCINATION STATUS: Immunization History  Administered Date(s) Administered  . Fluad Quad(high Dose 65+) 03/06/2020  . Moderna SARS-COVID-2 Vaccination 05/31/2019, 07/01/2019  . Zoster Recombinat (Shingrix) 03/10/2018, 06/22/2018    Diabetes She presents for her initial diabetic visit. She has type 2 diabetes mellitus. Onset time: She was diagnosed at approximate age of 74 years. Her disease course has been worsening. There are no hypoglycemic associated symptoms. Pertinent negatives for hypoglycemia include no confusion, headaches, pallor or seizures. Associated symptoms include fatigue, polydipsia and polyuria. Pertinent negatives for diabetes include no chest pain and no polyphagia. There are no hypoglycemic complications. Symptoms are worsening. Diabetic complications include autonomic neuropathy and a CVA. Risk factors for coronary artery disease include diabetes mellitus,  dyslipidemia, post-menopausal and sedentary lifestyle. Current diabetic treatments: She is currently on Novolin our 10 to 15 units twice daily, Metformin 1000 mg p.o. twice daily, Januvia 50 mg.  Daily. Her weight is fluctuating minimally. She is following a generally unhealthy diet. When asked about meal planning, she reported none. (She did not bring any logs nor meter to review.  She denies hypoglycemia.  Her point-of-care A1c 7.9%.) An ACE inhibitor/angiotensin II receptor blocker is being  taken. Eye exam is current.  Hyperlipidemia This is a chronic problem. The current episode started more than 1 year ago. The problem is uncontrolled. Exacerbating diseases include diabetes. Pertinent negatives include no chest pain, myalgias or shortness of breath. Risk factors for coronary artery disease include dyslipidemia, diabetes mellitus, hypertension, post-menopausal and a sedentary lifestyle.  Hypertension This is a chronic problem. The current episode started more than 1 year ago. Pertinent negatives include no chest pain, headaches, palpitations or shortness of breath. Risk factors for coronary artery disease include dyslipidemia, diabetes mellitus, post-menopausal state and sedentary lifestyle. Hypertensive end-organ damage includes CVA.     Review of Systems  Constitutional: Positive for fatigue. Negative for chills, fever and unexpected weight change.  HENT: Negative for trouble swallowing and voice change.   Eyes: Negative for visual disturbance.  Respiratory: Negative for cough, shortness of breath and wheezing.   Cardiovascular: Negative for chest pain, palpitations and leg swelling.  Gastrointestinal: Negative for diarrhea, nausea and vomiting.  Endocrine: Positive for polydipsia and polyuria. Negative for cold intolerance, heat intolerance and polyphagia.  Musculoskeletal: Negative for arthralgias and myalgias.  Skin: Negative for color change, pallor, rash and wound.  Neurological:  Negative for seizures and headaches.  Psychiatric/Behavioral: Negative for confusion and suicidal ideas.    Objective:    Vitals with BMI 04/09/2020 03/08/2020 12/11/2019  Height 5' 4.5" 5' 4.5" -  Weight 161 lbs 3 oz 159 lbs -  BMI 09.38 18.29 -  Systolic 937 169 678  Diastolic 68 80 72  Pulse 60 78 66    BP 140/68   Pulse 60   Ht 5' 4.5" (1.638 m)   Wt 161 lb 3.2 oz (73.1 kg)   LMP  (LMP Unknown)   BMI 27.24 kg/m   Wt Readings from Last 3 Encounters:  04/09/20 161 lb 3.2 oz (73.1 kg)  03/08/20 159 lb (72.1 kg)  12/11/19 160 lb (72.6 kg)     Physical Exam Constitutional:      Appearance: She is well-developed.  HENT:     Head: Normocephalic and atraumatic.  Neck:     Thyroid: No thyromegaly.     Trachea: No tracheal deviation.  Pulmonary:     Effort: Pulmonary effort is normal.  Abdominal:     Tenderness: There is no abdominal tenderness. There is no guarding.  Musculoskeletal:        General: Normal range of motion.     Cervical back: Normal range of motion and neck supple.  Skin:    General: Skin is warm and dry.     Coloration: Skin is not pale.     Findings: No erythema or rash.  Neurological:     Mental Status: She is alert and oriented to person, place, and time.     Cranial Nerves: No cranial nerve deficit.     Coordination: Coordination normal.     Deep Tendon Reflexes: Reflexes are normal and symmetric.  Psychiatric:        Judgment: Judgment normal.       CMP ( most recent) CMP     Component Value Date/Time   NA 138 12/11/2019 1653   K 4.8 12/11/2019 1653   CL 101 12/11/2019 1653   CO2 24 12/11/2019 1652   GLUCOSE 174 (H) 12/11/2019 1653   BUN 14 12/11/2019 1653   CREATININE 0.90 12/11/2019 1653   CALCIUM 9.6 12/11/2019 1652   PROT 6.8 12/11/2019 1652   ALBUMIN 3.7 12/11/2019 1652   AST 26 12/11/2019 1652  ALT 25 12/11/2019 1652   ALKPHOS 72 12/11/2019 1652   BILITOT 0.7 12/11/2019 1652   GFRNONAA 60 (L) 12/11/2019 1652   GFRAA  >60 12/11/2019 1652     Diabetic Labs (most recent): Lab Results  Component Value Date   HGBA1C 7.9 (A) 04/09/2020   HGBA1C 7.8 (H) 11/22/2019     Lipid Panel ( most recent) Lipid Panel     Component Value Date/Time   CHOL 194 11/23/2019 1504   TRIG 291 (H) 11/23/2019 1504   HDL 34 (L) 11/23/2019 1504   CHOLHDL 5.7 11/23/2019 1504   VLDL 58 (H) 11/23/2019 1504   LDLCALC 102 (H) 11/23/2019 1504      Assessment & Plan:   1. Type 2 diabetes mellitus with other specified complication, with long-term current use of insulin ([place])  - Jessica Mclean has currently uncontrolled symptomatic type 2 DM since  74 years of age,  with most recent A1c of 7.9 %. Recent labs reviewed. - I had a long discussion with her about the progressive nature of diabetes and the pathology behind its complications. -her diabetes is complicated by CVA, peripheral neuropathy and she remains at a high risk for more acute and chronic complications which include CAD, CVA, CKD, retinopathy, and neuropathy. These are all discussed in detail with her.  - I have counseled her on diet  and weight management  by adopting a carbohydrate restricted/protein rich diet. Patient is encouraged to switch to  unprocessed or minimally processed     complex starch and increased protein intake (animal or plant source), fruits, and vegetables. -  she is advised to stick to a routine mealtimes to eat 3 meals  a day and avoid unnecessary snacks ( to snack only to correct hypoglycemia).   - she acknowledges that there is a room for improvement in her food and drink choices. - Suggestion is made for her to avoid simple carbohydrates  from her diet including Cakes, Sweet Desserts, Ice Cream, Soda (diet and regular), Sweet Tea, Candies, Chips, Cookies, Store Bought Juices, Alcohol in Excess of  1-2 drinks a day, Artificial Sweeteners,  Coffee Creamer, and "Sugar-free" Products. This will help patient to have more stable blood glucose profile  and potentially avoid unintended weight gain.  - she will be scheduled with Jessica Mclean, RDN, CDE for diabetes education.  - I have approached her with the following individualized plan to manage  her diabetes and patient agrees:   - she wishes to stay on her current regimen of Novolin are, advised to adjust at 10 units 3 times daily AC for Premeal blood glucose readings above 90 mg/day.  She is advised to start strict monitoring of blood glucose 4 times a day-daily before meals and at bedtime.    This patient will benefit from a CGM.  I discussed and prescribed the freestyle libre device.   - she is warned not to take insulin without proper monitoring per orders. - Adjustment parameters are given to her for hypo and hyperglycemia in writing. - she is encouraged to call clinic for blood glucose levels less than 70 or above 300 mg /dl. - she is advised to continue Januvia 50 mg p.o. daily, Metformin 1000 mg p.o. twice daily, therapeutically suitable for patient .   - Specific targets for  A1c;  LDL, HDL,  and Triglycerides were discussed with the patient.  2) Blood Pressure /Hypertension:  her blood pressure is  controlled to target.   she is advised to continue  her current medications including amlodipine 10 mg p.o. daily, losartan 50 mg p.o. daily with breakfast .  3) Lipids/Hyperlipidemia:   Review of her recent lipid panel showed uncontrolled  LDL at 102 .  she  is advised to continue    atorvastatin 80 mg daily at bedtime.  Side effects and precautions discussed with her.  4)  Weight/Diet:  Body mass index is 27.24 kg/m.  -   clearly complicating her diabetes care.   she is  a candidate for some weight loss. I discussed with her the fact that loss of 5 - 10% of her  current body weight will have the most impact on her diabetes management.  Exercise, and detailed carbohydrates information provided  -  detailed on discharge instructions.  5) Chronic Care/Health Maintenance:  -she  is  on ACEI/ARB and Statin medications and  is encouraged to initiate and continue to follow up with Ophthalmology, Dentist,  Podiatrist at least yearly or according to recommendations, and advised to   stay away from smoking. I have recommended yearly flu vaccine and pneumonia vaccine at least every 5 years; moderate intensity exercise for up to 150 minutes weekly; and  sleep for at least 7 hours a day.  - she is  advised to maintain close follow up with Lindell Spar, MD for primary care needs, as well as her other providers for optimal and coordinated care.   - Time spent in this patient care: 60 min, of which > 50% was spent in  counseling  her about her currently uncontrolled type 2 diabetes, hyperlipidemia, hypertension and the rest reviewing her blood glucose logs , discussing her hypoglycemia and hyperglycemia episodes, reviewing her current and  previous labs / studies  ( including abstraction from other facilities) and medications  doses and developing a  long term treatment plan based on the latest standards of care/ guidelines; and documenting her care.    Please refer to Patient Instructions for Blood Glucose Monitoring and Insulin/Medications Dosing Guide"  in media tab for additional information. Please  also refer to " Patient Self Inventory" in the Media  tab for reviewed elements of pertinent patient history.  Jessica Mclean participated in the discussions, expressed understanding, and voiced agreement with the above plans.  All questions were answered to her satisfaction. she is encouraged to contact clinic should she have any questions or concerns prior to her return visit.   Follow up plan: - Return in about 10 days (around 04/19/2020) for F/U with Meter and Logs Only - no Labs.  Glade Lloyd, MD [facility] [address] [place], [place] [address] Phone: [phone]  Fax: [phone]    04/09/2020, 6:19 PM  This note was  partially dictated with voice recognition software. Similar sounding words can be transcribed inadequately or may not  be corrected upon review.

## 2020-04-15 ENCOUNTER — Other Ambulatory Visit: Payer: Self-pay | Admitting: Internal Medicine

## 2020-04-23 DIAGNOSIS — H25812 Combined forms of age-related cataract, left eye: Secondary | ICD-10-CM | POA: Diagnosis not present

## 2020-04-23 DIAGNOSIS — E119 Type 2 diabetes mellitus without complications: Secondary | ICD-10-CM | POA: Diagnosis not present

## 2020-04-23 DIAGNOSIS — Z01818 Encounter for other preprocedural examination: Secondary | ICD-10-CM | POA: Diagnosis not present

## 2020-04-24 ENCOUNTER — Ambulatory Visit: Payer: Medicare Other | Admitting: "Endocrinology

## 2020-05-01 ENCOUNTER — Ambulatory Visit: Payer: Medicare Other | Admitting: Urology

## 2020-05-13 ENCOUNTER — Other Ambulatory Visit: Payer: Self-pay | Admitting: Internal Medicine

## 2020-05-24 DIAGNOSIS — H2512 Age-related nuclear cataract, left eye: Secondary | ICD-10-CM | POA: Diagnosis not present

## 2020-05-24 DIAGNOSIS — H25812 Combined forms of age-related cataract, left eye: Secondary | ICD-10-CM | POA: Diagnosis not present

## 2020-06-11 ENCOUNTER — Other Ambulatory Visit: Payer: Self-pay

## 2020-06-11 ENCOUNTER — Encounter: Payer: Self-pay | Admitting: Urology

## 2020-06-11 ENCOUNTER — Ambulatory Visit (INDEPENDENT_AMBULATORY_CARE_PROVIDER_SITE_OTHER): Payer: Medicare Other | Admitting: Urology

## 2020-06-11 VITALS — BP 136/83 | HR 69 | Temp 97.6°F

## 2020-06-11 DIAGNOSIS — R310 Gross hematuria: Secondary | ICD-10-CM

## 2020-06-11 DIAGNOSIS — N3941 Urge incontinence: Secondary | ICD-10-CM

## 2020-06-11 LAB — URINALYSIS, ROUTINE W REFLEX MICROSCOPIC
Bilirubin, UA: NEGATIVE
Glucose, UA: NEGATIVE
Nitrite, UA: NEGATIVE
RBC, UA: NEGATIVE
Specific Gravity, UA: 1.025 (ref 1.005–1.030)
Urobilinogen, Ur: 0.2 mg/dL (ref 0.2–1.0)
pH, UA: 5 (ref 5.0–7.5)

## 2020-06-11 LAB — MICROSCOPIC EXAMINATION
RBC, Urine: NONE SEEN /hpf (ref 0–2)
Renal Epithel, UA: NONE SEEN /hpf

## 2020-06-11 LAB — BLADDER SCAN AMB NON-IMAGING: Scan Result: 0

## 2020-06-11 MED ORDER — MIRABEGRON ER 25 MG PO TB24
25.0000 mg | ORAL_TABLET | Freq: Every day | ORAL | 11 refills | Status: DC
Start: 2020-06-11 — End: 2021-02-26

## 2020-06-11 NOTE — Patient Instructions (Signed)
Overactive Bladder, Adult  Overactive bladder is a condition in which a person has a sudden and frequent need to urinate. A person might also leak urine if he or she cannot get to the bathroom fast enough (urinary incontinence). Sometimes, symptoms can interfere with work or social activities. What are the causes? Overactive bladder is associated with poor nerve signals between your bladder and your brain. Your bladder may get the signal to empty before it is full. You may also have very sensitive muscles that make your bladder squeeze too soon. This condition may also be caused by other factors, such as:  Medical conditions: ? Urinary tract infection. ? Infection of nearby tissues. ? Prostate enlargement. ? Bladder stones, inflammation, or tumors. ? Diabetes. ? Muscle or nerve weakness, especially from these conditions:  A spinal cord injury.  Stroke.  Multiple sclerosis.  Parkinson's disease.  Other causes: ? Surgery on the uterus or urethra. ? Drinking too much caffeine or alcohol. ? Certain medicines, especially those that eliminate extra fluid in the body (diuretics). ? Constipation. What increases the risk? You may be at greater risk for overactive bladder if you:  Are an older adult.  Smoke.  Are going through menopause.  Have prostate problems.  Have a neurological disease, such as stroke, dementia, Parkinson's disease, or multiple sclerosis (MS).  Eat or drink alcohol, spicy food, caffeine, and other things that irritate the bladder.  Are overweight or obese. What are the signs or symptoms? Symptoms of this condition include a sudden, strong urge to urinate. Other symptoms include:  Leaking urine.  Urinating 8 or more times a day.  Waking up to urinate 2 or more times overnight. How is this diagnosed? This condition may be diagnosed based on:  Your symptoms and medical history.  A physical exam.  Blood or urine tests to check for possible causes,  such as infection. You may also need to see a health care provider who specializes in urinary tract problems. This is called a urologist. How is this treated? Treatment for overactive bladder depends on the cause of your condition and whether it is mild or severe. Treatment may include:  Bladder training, such as: ? Learning to control the urge to urinate by following a schedule to urinate at regular intervals. ? Doing Kegel exercises to strengthen the pelvic floor muscles that support your bladder.  Special devices, such as: ? Biofeedback. This uses sensors to help you become aware of your body's signals. ? Electrical stimulation. This uses electrodes placed inside the body (implanted) or outside the body. These electrodes send gentle pulses of electricity to strengthen the nerves or muscles that control the bladder. ? Women may use a plastic device, called a pessary, that fits into the vagina and supports the bladder.  Medicines, such as: ? Antibiotics to treat bladder infection. ? Antispasmodics to stop the bladder from releasing urine at the wrong time. ? Tricyclic antidepressants to relax bladder muscles. ? Injections of botulinum toxin type A directly into the bladder tissue to relax bladder muscles.  Surgery, such as: ? A device may be implanted to help manage the nerve signals that control urination. ? An electrode may be implanted to stimulate electrical signals in the bladder. ? A procedure may be done to change the shape of the bladder. This is done only in very severe cases. Follow these instructions at home: Eating and drinking  Make diet or lifestyle changes recommended by your health care provider. These may include: ? Drinking fluids   throughout the day and not only with meals. ? Cutting down on caffeine or alcohol. ? Eating a healthy and balanced diet to prevent constipation. This may include:  Choosing foods that are high in fiber, such as beans, whole grains, and  fresh fruits and vegetables.  Limiting foods that are high in fat and processed sugars, such as fried and sweet foods.   Lifestyle  Lose weight if needed.  Do not use any products that contain nicotine or tobacco. These include cigarettes, chewing tobacco, and vaping devices, such as e-cigarettes. If you need help quitting, ask your health care provider.   General instructions  Take over-the-counter and prescription medicines only as told by your health care provider.  If you were prescribed an antibiotic medicine, take it as told by your health care provider. Do not stop taking the antibiotic even if you start to feel better.  Use any implants or pessary as told by your health care provider.  If needed, wear pads to absorb urine leakage.  Keep a log to track how much and when you drink, and when you need to urinate. This will help your health care provider monitor your condition.  Keep all follow-up visits. This is important. Contact a health care provider if:  You have a fever or chills.  Your symptoms do not get better with treatment.  Your pain and discomfort get worse.  You have more frequent urges to urinate. Get help right away if:  You are not able to control your bladder. Summary  Overactive bladder refers to a condition in which a person has a sudden and frequent need to urinate.  Several conditions may lead to an overactive bladder.  Treatment for overactive bladder depends on the cause and severity of your condition.  Making lifestyle changes, doing Kegel exercises, keeping a log, and taking medicines can help with this condition. This information is not intended to replace advice given to you by your health care provider. Make sure you discuss any questions you have with your health care provider. Document Revised: 01/23/2020 Document Reviewed: 01/23/2020 Elsevier Patient Education  2021 [employer] Inc.  

## 2020-06-11 NOTE — Progress Notes (Signed)
Urological Symptom Review  Patient is experiencing the following symptoms: Frequent urination Hard to postpone urination Burning/pain with urination Get up at night to urinate Leakage of urine Blood in urine Vaginal bleeding (female only)   Review of Systems  Gastrointestinal (upper)  : Negative for upper GI symptoms  Gastrointestinal (lower) : Negative for lower GI symptoms  Constitutional : Negative for symptoms  Skin: Negative for skin symptoms  Eyes: Negative for eye symptoms  Ear/Nose/Throat : Negative for Ear/Nose/Throat symptoms  Hematologic/Lymphatic: Negative for Hematologic/Lymphatic symptoms  Cardiovascular : Negative for cardiovascular symptoms  Respiratory : Negative for respiratory symptoms  Endocrine: Negative for endocrine symptoms  Musculoskeletal: Negative for musculoskeletal symptoms  Neurological: Negative for neurological symptoms  Psychologic: Negative for psychiatric symptoms

## 2020-06-11 NOTE — Progress Notes (Unsigned)
06/11/2020 3:17 PM   Nonda Lou [birth year] [id number]  Referring provider: Lindell Spar, MD [address] [place],  [place] [address]  Urge incontinence  HPI: Ms Gartin is a 75yo here for evaluation of gross hematuria and urge incontinence. She has a hx of cervical cancer treated with radiation therapy 7 years ago. She has had hemorrhagic cystitis treated with hyperbaric oxygen therapy. She has intermittent gross hematuria. She also has urge urinary incontinence. She is currently on oxybutynin 2m qhs which has not improved her incontinence. She uses 20 pads per day. She has associated fecal incontinence. CT 11/2019 showed no GU abnormalities.    PMH: Past Medical History:  Diagnosis Date  . Bleeding disorder ([place]   . Cervical cancer ([facility] 2010  . Diabetes mellitus without complication ([facility]    type 2  . Diabetes mellitus without complication ([facility]   . Heart murmur   . High cholesterol   . HLD (hyperlipidemia)   . Hypertension   . Mental disorder    depression; suicidal ideation in 2017  . Sleep apnea   . Vitamin D deficiency     Surgical History: Past Surgical History:  Procedure Laterality Date  . BILATERAL OOPHORECTOMY    . CESAREAN SECTION     x 4  . CHOLECYSTECTOMY    . COLOSTOMY     x2  . SALPINGECTOMY    . TONSILLECTOMY AND ADENOIDECTOMY      Home Medications:  Allergies as of 06/11/2020      Reactions   Other    Nickel staples      Medication List       Accurate as of June 11, 2020  3:17 PM. If you have any questions, ask your nurse or doctor.        Accu-Chek Aviva Plus test strip Generic drug: glucose blood   Accu-Chek Guide w/Device Kit   Accu-Chek Softclix Lancets lancets SMARTSIG:Topical   amLODipine 10 MG tablet Commonly known as: NORVASC Take 10 mg by mouth daily.   aspirin 325 MG EC tablet Take 1 tablet (325 mg total) by mouth daily. Swallow whole.   atorvastatin 40 MG tablet Commonly known as: LIPITOR Take 40 mg  by mouth daily.   atorvastatin 80 MG tablet Commonly known as: LIPITOR TAKE 1 TABLET BY MOUTH  DAILY   calcium-vitamin D 500-400 MG-UNIT tablet Commonly known as: OSCAL-500 Take 1 tablet by mouth daily.   desonide 0.05 % ointment Commonly known as: DESOWEN Apply 1 application topically 2 (two) times daily. What changed:   when to take this  reasons to take this   FreeStyle Libre 2 Reader DKerrin MoAs directed   [employer]2 Sensor Misc 1 Piece by Does not apply route every 14 (fourteen) days.   gabapentin 100 MG capsule Commonly known as: NEURONTIN TAKE 1 CAPSULE BY MOUTH AT  BEDTIME   insulin regular 100 units/mL injection Commonly known as: NOVOLIN R Inject 10 Units into the skin 3 (three) times daily before meals.   INSULIN SYRINGE .3CC/31GX5/16" 31G X 5/16" 0.3 ML Misc Use as directed.   Januvia 50 MG tablet Generic drug: sitaGLIPtin Take 1 tablet (50 mg total) by mouth daily.   losartan 50 MG tablet Commonly known as: COZAAR Take 50 mg by mouth daily.   meloxicam 15 MG tablet Commonly known as: MOBIC Take 1 tablet (15 mg total) by mouth daily as needed for pain.   metFORMIN 1000 MG tablet Commonly known as: GLUCOPHAGE TAKE 1 TABLET  BY MOUTH  TWICE DAILY WITH MEALS   omega-3 acid ethyl esters 1 g capsule Commonly known as: LOVAZA Take 2 g by mouth 2 (two) times daily.   oxybutynin 10 MG 24 hr tablet Commonly known as: DITROPAN-XL Take 1 tablet (10 mg total) by mouth at bedtime.   polyethylene glycol powder 17 GM/SCOOP powder Commonly known as: GLYCOLAX/MIRALAX Take 17 g by mouth daily as needed for mild constipation or moderate constipation.   sulfamethoxazole-trimethoprim 800-160 MG tablet Commonly known as: BACTRIM DS Take 1 tablet by mouth 2 (two) times daily.       Allergies:  Allergies  Allergen Reactions  . Other     Nickel staples    Family History: Family History  Problem Relation Age of Onset  . Cancer Father        kidney   . Cancer Mother        lung  . Other Brother        heart issues    Social History:  reports that she has never smoked. She has never used smokeless tobacco. She reports that she does not drink alcohol and does not use drugs.  ROS: All other review of systems were reviewed and are negative except what is noted above in HPI  Physical Exam: BP 136/83   Pulse 69   Temp 97.6 F (36.4 C)   LMP  (LMP Unknown)   Constitutional:  Alert and oriented, No acute distress. HEENT: [place] AT, moist mucus membranes.  Trachea midline, no masses. Cardiovascular: No clubbing, cyanosis, or edema. Respiratory: Normal respiratory effort, no increased work of breathing. GI: Abdomen is soft, nontender, nondistended, no abdominal masses GU: No CVA tenderness.  Lymph: No cervical or inguinal lymphadenopathy. Skin: No rashes, bruises or suspicious lesions. Neurologic: Grossly intact, no focal deficits, moving all 4 extremities. Psychiatric: Normal mood and affect.  Laboratory Data: Lab Results  Component Value Date   WBC 5.7 12/11/2019   HGB 11.6 (L) 12/11/2019   HCT 34.0 (L) 12/11/2019   MCV 84.8 12/11/2019   PLT 297 12/11/2019    Lab Results  Component Value Date   CREATININE 0.90 12/11/2019    No results found for: PSA  No results found for: TESTOSTERONE  Lab Results  Component Value Date   HGBA1C 7.9 (A) 04/09/2020    Urinalysis    Component Value Date/Time   COLORURINE YELLOW 11/23/2019 0133   APPEARANCEUR CLEAR 11/23/2019 0133   LABSPEC 1.016 11/23/2019 0133   PHURINE 5.0 11/23/2019 0133   GLUCOSEU NEGATIVE 11/23/2019 0133   HGBUR NEGATIVE 11/23/2019 0133   BILIRUBINUR small (A) 03/08/2020 1429   KETONESUR negative 03/08/2020 1429   KETONESUR NEGATIVE 11/23/2019 0133   PROTEINUR 30 (A) 11/23/2019 0133   UROBILINOGEN 0.2 03/08/2020 1429   NITRITE Negative 03/08/2020 1429   NITRITE NEGATIVE 11/23/2019 0133   LEUKOCYTESUR Negative 03/08/2020 1429   LEUKOCYTESUR NEGATIVE  11/23/2019 0133    Lab Results  Component Value Date   BACTERIA NONE SEEN 11/23/2019    Pertinent Imaging: CT 11/2019: Images reviewed and discussed with the patient No results found for this or any previous visit.  No results found for this or any previous visit.  No results found for this or any previous visit.  No results found for this or any previous visit.  No results found for this or any previous visit.  No results found for this or any previous visit.  No results found for this or any previous visit.  No results  found for this or any previous visit.   Assessment & Plan:    1. Urge incontinence -We will trial mirabegron 40m daily - BLADDER SCAN AMB NON-IMAGING - Urinalysis, Routine w reflex microscopic  2. Gross hematuria -RTC 4 weeks for cystoscopy   Return in about 4 weeks (around 07/09/2020).  PNicolette Bang MD  [facility]

## 2020-06-14 ENCOUNTER — Encounter: Payer: Self-pay | Admitting: Internal Medicine

## 2020-06-14 ENCOUNTER — Ambulatory Visit (INDEPENDENT_AMBULATORY_CARE_PROVIDER_SITE_OTHER): Payer: Medicare Other | Admitting: Internal Medicine

## 2020-06-14 ENCOUNTER — Other Ambulatory Visit: Payer: Self-pay

## 2020-06-14 VITALS — BP 161/78 | HR 71 | Temp 98.6°F | Resp 18 | Ht 64.5 in | Wt 165.8 lb

## 2020-06-14 DIAGNOSIS — E1169 Type 2 diabetes mellitus with other specified complication: Secondary | ICD-10-CM | POA: Diagnosis not present

## 2020-06-14 DIAGNOSIS — E785 Hyperlipidemia, unspecified: Secondary | ICD-10-CM | POA: Diagnosis not present

## 2020-06-14 DIAGNOSIS — E1149 Type 2 diabetes mellitus with other diabetic neurological complication: Secondary | ICD-10-CM

## 2020-06-14 DIAGNOSIS — G609 Hereditary and idiopathic neuropathy, unspecified: Secondary | ICD-10-CM | POA: Insufficient documentation

## 2020-06-14 DIAGNOSIS — E1140 Type 2 diabetes mellitus with diabetic neuropathy, unspecified: Secondary | ICD-10-CM | POA: Insufficient documentation

## 2020-06-14 DIAGNOSIS — N3941 Urge incontinence: Secondary | ICD-10-CM | POA: Diagnosis not present

## 2020-06-14 DIAGNOSIS — I10 Essential (primary) hypertension: Secondary | ICD-10-CM

## 2020-06-14 DIAGNOSIS — G459 Transient cerebral ischemic attack, unspecified: Secondary | ICD-10-CM | POA: Diagnosis not present

## 2020-06-14 NOTE — Progress Notes (Signed)
Established Patient Office Visit  Subjective:  Patient ID: Jessica Mclean, female    DOB: [birth year]  Age: 75 y.o. MRN: [id number]  CC:  Chief Complaint  Patient presents with  . Follow-up    3 month follow up     HPI Jessica Mclean  is a 75 year old female with PMH of HTN, DM2 on insulin, TIA, HLD and cervical ca. S/p radiation who presents for follow up for her chronic medical conditions.  Her HbA1C was 7.9 in 03/2020. She checks her blood glucose at home and takes insulin according to ISS. She visited Dr Dorris Fetch recently for DM management. She has not received her continuous glucose meter yet. She has been having numbness in the legs despite Gabapentin, but would prefer to avoid higher dose of Gabapentin.  She still has urinary incontinence and burning sensation. She had a visit with Urology and was prescribed Myrbetric and is planned to have cystoscopy.  Her BP is elevated today, but she has not taken her medications yet. She denies any headache, dizziness, chest pain, dyspnea or palpitations.  Past Medical History:  Diagnosis Date  . Bleeding disorder ([place])   . Cervical cancer ([place]) 2010  . Diabetes mellitus without complication ([place])    type 2  . Diabetes mellitus without complication ([place])   . Heart murmur   . High cholesterol   . HLD (hyperlipidemia)   . Hypertension   . Mental disorder    depression; suicidal ideation in 2017  . Sleep apnea   . Vitamin D deficiency     Past Surgical History:  Procedure Laterality Date  . BILATERAL OOPHORECTOMY    . CESAREAN SECTION     x 4  . CHOLECYSTECTOMY    . COLOSTOMY     x2  . SALPINGECTOMY    . TONSILLECTOMY AND ADENOIDECTOMY      Family History  Problem Relation Age of Onset  . Cancer Father        kidney  . Cancer Mother        lung  . Other Brother        heart issues    Social History   Socioeconomic History  . Marital status: Divorced    Spouse name: Not on file  . Number of children: 3  . Years of  education: Not on file  . Highest education level: Not on file  Occupational History  . Occupation: retired  Tobacco Use  . Smoking status: Never Smoker  . Smokeless tobacco: Never Used  Substance and Sexual Activity  . Alcohol use: Never  . Drug use: Never  . Sexual activity: Not Currently    Birth control/protection: Post-menopausal  Other Topics Concern  . Not on file  Social History Narrative   ** Merged History Encounter **       Social Determinants of Health   Financial Resource Strain: Not on file  Food Insecurity: Not on file  Transportation Needs: Not on file  Physical Activity: Not on file  Stress: Not on file  Social Connections: Not on file  Intimate Partner Violence: Not on file    Outpatient Medications Prior to Visit  Medication Sig Dispense Refill  . ACCU-CHEK AVIVA PLUS test strip     . Accu-Chek Softclix Lancets lancets SMARTSIG:Topical    . amLODipine (NORVASC) 10 MG tablet Take 10 mg by mouth daily.    Marland Kitchen aspirin EC 325 MG EC tablet Take 1 tablet (325 mg total) by mouth daily. Swallow whole. 30 tablet  1  . atorvastatin (LIPITOR) 40 MG tablet Take 40 mg by mouth daily.    Marland Kitchen atorvastatin (LIPITOR) 80 MG tablet TAKE 1 TABLET BY MOUTH  DAILY 60 tablet 5  . Blood Glucose Monitoring Suppl (ACCU-CHEK GUIDE) w/Device KIT     . calcium-vitamin D (OSCAL-500) 500-400 MG-UNIT tablet Take 1 tablet by mouth daily.    . Continuous Blood Gluc Receiver (FREESTYLE LIBRE 2 READER) DEVI As directed 1 each 0  . Continuous Blood Gluc Sensor (FREESTYLE LIBRE 2 SENSOR) MISC 1 Piece by Does not apply route every 14 (fourteen) days. 2 each 3  . desonide (DESOWEN) 0.05 % ointment Apply 1 application topically 2 (two) times daily. (Patient taking differently: Apply 1 application topically 2 (two) times daily as needed (skin irritation post poison ivy contact).) 15 g 0  . gabapentin (NEURONTIN) 100 MG capsule TAKE 1 CAPSULE BY MOUTH AT  BEDTIME 90 capsule 0  . insulin regular  (NOVOLIN R,HUMULIN R) 100 units/mL injection Inject 10 Units into the skin 3 (three) times daily before meals.    . Insulin Syringe-Needle U-100 (INSULIN SYRINGE .3CC/31GX5/16") 31G X 5/16" 0.3 ML MISC Use as directed. 100 each 0  . JANUVIA 50 MG tablet Take 1 tablet (50 mg total) by mouth daily. 90 tablet 0  . losartan (COZAAR) 50 MG tablet Take 50 mg by mouth daily.    . meloxicam (MOBIC) 15 MG tablet Take 1 tablet (15 mg total) by mouth daily as needed for pain.    . metFORMIN (GLUCOPHAGE) 1000 MG tablet TAKE 1 TABLET BY MOUTH  TWICE DAILY WITH MEALS 180 tablet 0  . mirabegron ER (MYRBETRIQ) 25 MG TB24 tablet Take 1 tablet (25 mg total) by mouth daily. 30 tablet 11  . omega-3 acid ethyl esters (LOVAZA) 1 g capsule Take 2 g by mouth 2 (two) times daily.    . polyethylene glycol powder (GLYCOLAX/MIRALAX) 17 GM/SCOOP powder Take 17 g by mouth daily as needed for mild constipation or moderate constipation.     Marland Kitchen oxybutynin (DITROPAN-XL) 10 MG 24 hr tablet Take 1 tablet (10 mg total) by mouth at bedtime. 30 tablet 3  . sulfamethoxazole-trimethoprim (BACTRIM DS) 800-160 MG tablet Take 1 tablet by mouth 2 (two) times daily. 10 tablet 0   No facility-administered medications prior to visit.    Allergies  Allergen Reactions  . Other     Nickel staples    ROS Review of Systems  Constitutional: Negative for chills and fever.  HENT: Negative for congestion, sinus pressure, sinus pain and sore throat.   Eyes: Negative for pain and discharge.  Respiratory: Negative for cough and shortness of breath.   Cardiovascular: Negative for chest pain and palpitations.  Gastrointestinal: Negative for abdominal pain, constipation, diarrhea, nausea and vomiting.  Endocrine: Negative for polydipsia and polyuria.  Genitourinary: Positive for frequency and urgency. Negative for dysuria, flank pain and hematuria.  Musculoskeletal: Negative for neck pain and neck stiffness.  Skin: Negative for rash.   Neurological: Negative for dizziness, syncope, speech difficulty, weakness and numbness.  Psychiatric/Behavioral: Negative for agitation and behavioral problems.      Objective:    Physical Exam Vitals reviewed.  Constitutional:      General: She is not in acute distress.    Appearance: She is not diaphoretic.  HENT:     Head: Normocephalic and atraumatic.     Nose: Nose normal.     Mouth/Throat:     Mouth: Mucous membranes are moist.  Eyes:  General: No scleral icterus.    Extraocular Movements: Extraocular movements intact.     Pupils: Pupils are equal, round, and reactive to light.  Cardiovascular:     Rate and Rhythm: Normal rate and regular rhythm.     Pulses: Normal pulses.     Heart sounds: Normal heart sounds. No murmur heard. No friction rub. No gallop.   Pulmonary:     Breath sounds: Normal breath sounds. No wheezing or rales.  Abdominal:     Palpations: Abdomen is soft.     Tenderness: There is no abdominal tenderness.  Musculoskeletal:     Cervical back: Neck supple. No tenderness.     Right lower leg: No edema.     Left lower leg: No edema.  Skin:    General: Skin is warm.     Findings: No bruising.  Neurological:     General: No focal deficit present.     Mental Status: She is alert and oriented to person, place, and time.     Sensory: No sensory deficit.     Motor: No weakness.  Psychiatric:        Mood and Affect: Mood normal.        Behavior: Behavior normal.     BP (!) 161/78 (BP Location: Right Arm, Patient Position: Sitting, Cuff Size: Normal)   Pulse 71   Temp 98.6 F (37 C) (Oral)   Resp 18   Ht 5' 4.5" (1.638 m)   Wt 165 lb 12.8 oz (75.2 kg)   LMP  (LMP Unknown)   SpO2 96%   BMI 28.02 kg/m  Wt Readings from Last 3 Encounters:  06/14/20 165 lb 12.8 oz (75.2 kg)  04/09/20 161 lb 3.2 oz (73.1 kg)  03/08/20 159 lb (72.1 kg)     Health Maintenance Due  Topic Date Due  . Hepatitis C Screening  Never done  . FOOT EXAM  Never  done  . OPHTHALMOLOGY EXAM  Never done  . TETANUS/TDAP  Never done  . DEXA SCAN  Never done    There are no preventive care reminders to display for this patient.  No results found for: TSH Lab Results  Component Value Date   WBC 5.7 12/11/2019   HGB 11.6 (L) 12/11/2019   HCT 34.0 (L) 12/11/2019   MCV 84.8 12/11/2019   PLT 297 12/11/2019   Lab Results  Component Value Date   NA 138 12/11/2019   K 4.8 12/11/2019   CO2 24 12/11/2019   GLUCOSE 174 (H) 12/11/2019   BUN 14 12/11/2019   CREATININE 0.90 12/11/2019   BILITOT 0.7 12/11/2019   ALKPHOS 72 12/11/2019   AST 26 12/11/2019   ALT 25 12/11/2019   PROT 6.8 12/11/2019   ALBUMIN 3.7 12/11/2019   CALCIUM 9.6 12/11/2019   ANIONGAP 12 12/11/2019   Lab Results  Component Value Date   CHOL 194 11/23/2019   Lab Results  Component Value Date   HDL 34 (L) 11/23/2019   Lab Results  Component Value Date   LDLCALC 102 (H) 11/23/2019   Lab Results  Component Value Date   TRIG 291 (H) 11/23/2019   Lab Results  Component Value Date   CHOLHDL 5.7 11/23/2019   Lab Results  Component Value Date   HGBA1C 7.9 (A) 04/09/2020      Assessment & Plan:   Problem List Items Addressed This Visit      Cardiovascular and Mediastinum   TIA (transient ischemic attack)    H/o TIA  in 12/2019 On Aspirin and statin No neurological deficitH/o TIA in 12/2019 On Aspirin and statin No neurological deficit      Essential hypertension - Primary    BP Readings from Last 1 Encounters:  06/14/20 (!) 161/78   Uncontrolled due to missed dose Losartan 50 mg QD and Amlodipine 10 mg QD Counseled for compliance with the medications Advised DASH diet and moderate exercise/walking, at least 150 mins/week Advised to check BP at home and bring the log in next visit       Relevant Orders   CBC with Differential   CMP14+EGFR   TSH     Endocrine   Type 2 diabetes mellitus with hyperlipidemia (HCC)    HbA1C: 7.8 (11/2019) On Novolin,  Metformin and Januvia Takes Novolin according to ISS Advised to follow diabetic diet On ARB and statin F/u CMP and lipid panel Diabetic eye exam: Advised to follow up with Ophthalmology for diabetic eye exam  Will benefit from continuous glucose monitoring.      Relevant Orders   CMP14+EGFR   Hemoglobin A1c   Lipid panel   Diabetic neuropathy (HCC)    On Gabapentin 100 mg QD Still has numbness at times, patient prefers to stay on same dose for now.        Other   Urge incontinence    On Myrbetric Has hematuria at times and UA showed blood Plan for cystoscopy according to last visit with Urology         No orders of the defined types were placed in this encounter.   Follow-up: Return in about 4 months (around 10/12/2020).    Lindell Spar, MD

## 2020-06-14 NOTE — Assessment & Plan Note (Signed)
On Gabapentin 100 mg QD Still has numbness at times, patient prefers to stay on same dose for now. 

## 2020-06-14 NOTE — Assessment & Plan Note (Signed)
On Myrbetric Has hematuria at times and UA showed blood Plan for cystoscopy according to last visit with Urology

## 2020-06-14 NOTE — Patient Instructions (Addendum)
Please continue taking medications as prescribed.  Please follow up with Urology for possible cystoscopy.  Please contact Dr Liliane Channel office for freestyle [place] device.  Please get fasting blood tests done before the next visit.

## 2020-06-14 NOTE — Assessment & Plan Note (Signed)
H/o TIA in 12/2019 On Aspirin and statin No neurological deficitH/o TIA in 12/2019 On Aspirin and statin No neurological deficit

## 2020-06-14 NOTE — Assessment & Plan Note (Signed)
HbA1C: 7.8 (11/2019) On Novolin, Metformin and Januvia Takes Novolin according to ISS Advised to follow diabetic diet On ARB and statin F/u CMP and lipid panel Diabetic eye exam: Advised to follow up with Ophthalmology for diabetic eye exam  Will benefit from continuous glucose monitoring.

## 2020-06-14 NOTE — Assessment & Plan Note (Signed)
BP Readings from Last 1 Encounters:  06/14/20 (!) 161/78   Uncontrolled due to missed dose Losartan 50 mg QD and Amlodipine 10 mg QD Counseled for compliance with the medications Advised DASH diet and moderate exercise/walking, at least 150 mins/week Advised to check BP at home and bring the log in next visit

## 2020-06-18 ENCOUNTER — Telehealth: Payer: Self-pay | Admitting: *Deleted

## 2020-06-18 ENCOUNTER — Other Ambulatory Visit: Payer: Self-pay | Admitting: *Deleted

## 2020-06-18 MED ORDER — MELOXICAM 15 MG PO TABS: 15.0000 mg | ORAL_TABLET | Freq: Every day | ORAL | Status: DC | PRN

## 2020-06-18 NOTE — Telephone Encounter (Signed)
Okay to refill.  Thanks.

## 2020-06-18 NOTE — Telephone Encounter (Signed)
Medication was sent to pharmacy.

## 2020-06-18 NOTE — Telephone Encounter (Signed)
Got a refill request from [facility] for meloxicam. This looks like it hasnt been prescribed since July and was by ER physician. Would you like to refill?

## 2020-06-26 ENCOUNTER — Telehealth: Payer: Self-pay

## 2020-06-26 LAB — HM DIABETES EYE EXAM

## 2020-06-26 NOTE — Telephone Encounter (Signed)
Shelby with [facility] left a voicemail that they received a RX on the patient but it was missing the DX code, quantity and refills. She did not leave the prescription - please call her at [phone]

## 2020-06-26 NOTE — Telephone Encounter (Signed)
Left a message requesting a return call to the office. 

## 2020-06-27 NOTE — Telephone Encounter (Signed)
Left a message requesting a return call to the office. 

## 2020-06-28 ENCOUNTER — Other Ambulatory Visit: Payer: Self-pay | Admitting: Internal Medicine

## 2020-06-28 DIAGNOSIS — E785 Hyperlipidemia, unspecified: Secondary | ICD-10-CM

## 2020-06-28 DIAGNOSIS — E1169 Type 2 diabetes mellitus with other specified complication: Secondary | ICD-10-CM

## 2020-06-29 ENCOUNTER — Other Ambulatory Visit: Payer: Self-pay | Admitting: *Deleted

## 2020-06-29 MED ORDER — INSULIN REGULAR HUMAN 100 UNIT/ML IJ SOLN: 10.0000 [IU] | Freq: Three times a day (TID) | INTRAMUSCULAR | 2 refills | Status: DC

## 2020-07-02 ENCOUNTER — Other Ambulatory Visit: Payer: Self-pay | Admitting: Internal Medicine

## 2020-07-02 ENCOUNTER — Telehealth: Payer: Self-pay | Admitting: "Endocrinology

## 2020-07-02 ENCOUNTER — Telehealth: Payer: Self-pay | Admitting: *Deleted

## 2020-07-02 DIAGNOSIS — E1169 Type 2 diabetes mellitus with other specified complication: Secondary | ICD-10-CM

## 2020-07-02 DIAGNOSIS — E785 Hyperlipidemia, unspecified: Secondary | ICD-10-CM

## 2020-07-02 MED ORDER — INSULIN REGULAR HUMAN 100 UNIT/ML IJ SOLN: 10.0000 [IU] | Freq: Three times a day (TID) | INTRAMUSCULAR | 5 refills | Status: DC

## 2020-07-02 NOTE — Telephone Encounter (Signed)
[facility] called on behalf of pt stated the novalin R was called in and they need the alternative humulin R called in so pt insurance would cover please advise

## 2020-07-02 NOTE — Telephone Encounter (Signed)
Left a message requesting a return call to the office. 

## 2020-07-02 NOTE — Telephone Encounter (Signed)
Sent prescription for Humulin R. Please let the patient know as well. Thank you.

## 2020-07-02 NOTE — Telephone Encounter (Signed)
Bionca is calling from [facility] and is missing Diagnosis code for [facility] 2 sensor and reader. She can be reached at [phone]

## 2020-07-03 ENCOUNTER — Other Ambulatory Visit: Payer: Self-pay | Admitting: *Deleted

## 2020-07-03 ENCOUNTER — Telehealth: Payer: Self-pay | Admitting: *Deleted

## 2020-07-03 NOTE — Telephone Encounter (Signed)
Jessica Mclean is returning [place] phone call and can be reached at [phone]

## 2020-07-03 NOTE — Telephone Encounter (Signed)
LVM notifying pt of change

## 2020-07-03 NOTE — Telephone Encounter (Signed)
Left a message requesting a return call to the office. 

## 2020-07-03 NOTE — Telephone Encounter (Signed)
Pharmacy called [facility]VM x3 about novalin being changed to humulin r instead this prescription was sent 07-02-20. Called [facility] back and they had received prescriptions so they werent sure why we had received 3 vm. Confirmed they had this and no return call was needed

## 2020-07-04 ENCOUNTER — Telehealth: Payer: Self-pay

## 2020-07-04 NOTE — Telephone Encounter (Signed)
Left a message requesting a return call to the office. 

## 2020-07-04 NOTE — Telephone Encounter (Signed)
[name], Patient will not be back, she is going to have Dr [name] take over her DM care

## 2020-07-04 NOTE — Telephone Encounter (Signed)
Noted  

## 2020-07-04 NOTE — Telephone Encounter (Signed)
Patient left a VM for a return call from the nurse. It looks like this patient was referred here for DM management by Dr [name] but also looks like that office is making changes to her RX for her DM. Please advise with patient if she is coming here for DM care or Dr Serita Grit office.

## 2020-07-04 NOTE — Telephone Encounter (Signed)
Pt states that Dr.Patel is going to manage her diabetes care. States she was pleased with Dr.Nida and staff but is unable to afford the copay her insurance requires her to pay for a specialist.

## 2020-07-04 NOTE — Telephone Encounter (Signed)
noted 

## 2020-07-31 ENCOUNTER — Ambulatory Visit (INDEPENDENT_AMBULATORY_CARE_PROVIDER_SITE_OTHER): Payer: Medicare Other

## 2020-07-31 ENCOUNTER — Other Ambulatory Visit: Payer: Self-pay

## 2020-07-31 ENCOUNTER — Ambulatory Visit: Payer: Medicare Other

## 2020-07-31 VITALS — Ht 64.5 in | Wt 160.0 lb

## 2020-07-31 DIAGNOSIS — E1169 Type 2 diabetes mellitus with other specified complication: Secondary | ICD-10-CM | POA: Diagnosis not present

## 2020-07-31 DIAGNOSIS — Z0000 Encounter for general adult medical examination without abnormal findings: Secondary | ICD-10-CM | POA: Diagnosis not present

## 2020-07-31 DIAGNOSIS — E785 Hyperlipidemia, unspecified: Secondary | ICD-10-CM | POA: Diagnosis not present

## 2020-07-31 DIAGNOSIS — I10 Essential (primary) hypertension: Secondary | ICD-10-CM

## 2020-07-31 MED ORDER — FREESTYLE LIBRE 2 READER DEVI: 0 refills | Status: DC

## 2020-07-31 MED ORDER — FREESTYLE LIBRE 2 SENSOR MISC: 1.0000 | 3 refills | Status: DC

## 2020-07-31 NOTE — Patient Instructions (Signed)
Jessica Mclean , Thank you for taking time to come for your Medicare Wellness Visit. I appreciate your ongoing commitment to your health goals. Please review the following plan we discussed and let me know if I can assist you in the future.   Screening recommendations/referrals: Colonoscopy: may be due- discuss with Jessica Mclean at next appt for referral if needed Mammogram: up to date Bone Density: SCHEDULED for thurs 08/30/20 at 2:00pm Recommended yearly ophthalmology/optometry visit for glaucoma screening and checkup Recommended yearly dental visit for hygiene and checkup  Vaccinations: Influenza vaccine: up to date  Pneumococcal vaccine: up to date  Tdap vaccine: not covered as a preventative vaccine Shingles vaccine: if have not received- can get at local pharmacy    Conditions/risks identified: fall risk, food insecurity  Next appointment: August 01, 2021 1:00pm   Preventive Care 65 Years and Older, Female Preventive care refers to lifestyle choices and visits with your health care provider that can promote health and wellness. What does preventive care include?  A yearly physical exam. This is also called an annual well check.  Dental exams once or twice a year.  Routine eye exams. Ask your health care provider how often you should have your eyes checked.  Personal lifestyle choices, including:  Daily care of your teeth and gums.  Regular physical activity.  Eating a healthy diet.  Avoiding tobacco and drug use.  Limiting alcohol use.  Practicing safe sex.  Taking low-dose aspirin every day.  Taking vitamin and mineral supplements as recommended by your health care provider. What happens during an annual well check? The services and screenings done by your health care provider during your annual well check will depend on your age, overall health, lifestyle risk factors, and family history of disease. Counseling  Your health care provider may ask you questions about  your:  Alcohol use.  Tobacco use.  Drug use.  Emotional well-being.  Home and relationship well-being.  Sexual activity.  Eating habits.  History of falls.  Memory and ability to understand (cognition).  Work and work Statistician.  Reproductive health. Screening  You may have the following tests or measurements:  Height, weight, and BMI.  Blood pressure.  Lipid and cholesterol levels. These may be checked every 5 years, or more frequently if you are over 60 years old.  Skin check.  Lung cancer screening. You may have this screening every year starting at age 44 if you have a 30-pack-year history of smoking and currently smoke or have quit within the past 15 years.  Fecal occult blood test (FOBT) of the stool. You may have this test every year starting at age 18.  Flexible sigmoidoscopy or colonoscopy. You may have a sigmoidoscopy every 5 years or a colonoscopy every 10 years starting at age 53.  Hepatitis C blood test.  Hepatitis B blood test.  Sexually transmitted disease (STD) testing.  Diabetes screening. This is done by checking your blood sugar (glucose) after you have not eaten for a while (fasting). You may have this done every 1-3 years.  Bone density scan. This is done to screen for osteoporosis. You may have this done starting at age 70.  Mammogram. This may be done every 1-2 years. Talk to your health care provider about how often you should have regular mammograms. Talk with your health care provider about your test results, treatment options, and if necessary, the need for more tests. Vaccines  Your health care provider may recommend certain vaccines, such as:  Influenza vaccine.  This is recommended every year.  Tetanus, diphtheria, and acellular pertussis (Tdap, Td) vaccine. You may need a Td booster every 10 years.  Zoster vaccine. You may need this after age 68.  Pneumococcal 13-valent conjugate (PCV13) vaccine. One dose is recommended  after age 76.  Pneumococcal polysaccharide (PPSV23) vaccine. One dose is recommended after age 20. Talk to your health care provider about which screenings and vaccines you need and how often you need them. This information is not intended to replace advice given to you by your health care provider. Make sure you discuss any questions you have with your health care provider. Document Released: 06/01/2015 Document Revised: 01/23/2016 Document Reviewed: 03/06/2015 Elsevier Interactive Patient Education  2017 [employer] Prevention in the Home Falls can cause injuries. They can happen to people of all ages. There are many things you can do to make your home safe and to help prevent falls. What can I do on the outside of my home?  Regularly fix the edges of walkways and driveways and fix any cracks.  Remove anything that might make you trip as you walk through a door, such as a raised step or threshold.  Trim any bushes or trees on the path to your home.  Use bright outdoor lighting.  Clear any walking paths of anything that might make someone trip, such as rocks or tools.  Regularly check to see if handrails are loose or broken. Make sure that both sides of any steps have handrails.  Any raised decks and porches should have guardrails on the edges.  Have any leaves, snow, or ice cleared regularly.  Use sand or salt on walking paths during winter.  Clean up any spills in your garage right away. This includes oil or grease spills. What can I do in the bathroom?  Use night lights.  Install grab bars by the toilet and in the tub and shower. Do not use towel bars as grab bars.  Use non-skid mats or decals in the tub or shower.  If you need to sit down in the shower, use a plastic, non-slip stool.  Keep the floor dry. Clean up any water that spills on the floor as soon as it happens.  Remove soap buildup in the tub or shower regularly.  Attach bath mats securely with  double-sided non-slip rug tape.  Do not have throw rugs and other things on the floor that can make you trip. What can I do in the bedroom?  Use night lights.  Make sure that you have a light by your bed that is easy to reach.  Do not use any sheets or blankets that are too big for your bed. They should not hang down onto the floor.  Have a firm chair that has side arms. You can use this for support while you get dressed.  Do not have throw rugs and other things on the floor that can make you trip. What can I do in the kitchen?  Clean up any spills right away.  Avoid walking on wet floors.  Keep items that you use a lot in easy-to-reach places.  If you need to reach something above you, use a strong step stool that has a grab bar.  Keep electrical cords out of the way.  Do not use floor polish or wax that makes floors slippery. If you must use wax, use non-skid floor wax.  Do not have throw rugs and other things on the floor that can make  you trip. What can I do with my stairs?  Do not leave any items on the stairs.  Make sure that there are handrails on both sides of the stairs and use them. Fix handrails that are broken or loose. Make sure that handrails are as long as the stairways.  Check any carpeting to make sure that it is firmly attached to the stairs. Fix any carpet that is loose or worn.  Avoid having throw rugs at the top or bottom of the stairs. If you do have throw rugs, attach them to the floor with carpet tape.  Make sure that you have a light switch at the top of the stairs and the bottom of the stairs. If you do not have them, ask someone to add them for you. What else can I do to help prevent falls?  Wear shoes that:  Do not have high heels.  Have rubber bottoms.  Are comfortable and fit you well.  Are closed at the toe. Do not wear sandals.  If you use a stepladder:  Make sure that it is fully opened. Do not climb a closed stepladder.  Make  sure that both sides of the stepladder are locked into place.  Ask someone to hold it for you, if possible.  Clearly mark and make sure that you can see:  Any grab bars or handrails.  First and last steps.  Where the edge of each step is.  Use tools that help you move around (mobility aids) if they are needed. These include:  Canes.  Walkers.  Scooters.  Crutches.  Turn on the lights when you go into a dark area. Replace any light bulbs as soon as they burn out.  Set up your furniture so you have a clear path. Avoid moving your furniture around.  If any of your floors are uneven, fix them.  If there are any pets around you, be aware of where they are.  Review your medicines with your doctor. Some medicines can make you feel dizzy. This can increase your chance of falling. Ask your doctor what other things that you can do to help prevent falls. This information is not intended to replace advice given to you by your health care provider. Make sure you discuss any questions you have with your health care provider. Document Released: 03/01/2009 Document Revised: 10/11/2015 Document Reviewed: 06/09/2014 Elsevier Interactive Patient Education  2017 [employer].

## 2020-07-31 NOTE — Progress Notes (Signed)
Subjective:   Jessica Mclean is a 75 y.o. female who presents for an Initial Medicare Annual Wellness Visit.  Review of Systems     Cardiac Risk Factors include: diabetes mellitus;dyslipidemia;hypertension     Objective:    Today's Vitals   07/31/20 1335  Weight: 160 lb (72.6 kg)  Height: 5' 4.5" (1.638 m)  PainSc: 0-No pain   Body mass index is 27.04 kg/m.  Advanced Directives 07/31/2020 12/11/2019 11/22/2019 11/22/2019 01/20/2018 12/19/2016  Does Patient Have a Medical Advance Directive? No No No No No No  Would patient like information on creating a medical advance directive? Yes (ED - Information included in AVS) No - Patient declined No - Patient declined No - Patient declined No - Patient declined -    Current Medications (verified) Outpatient Encounter Medications as of 07/31/2020  Medication Sig  . ACCU-CHEK AVIVA PLUS test strip   . Accu-Chek Softclix Lancets lancets SMARTSIG:Topical  . amLODipine (NORVASC) 10 MG tablet Take 10 mg by mouth daily.  Marland Kitchen aspirin EC 325 MG EC tablet Take 1 tablet (325 mg total) by mouth daily. Swallow whole.  Marland Kitchen atorvastatin (LIPITOR) 40 MG tablet Take 40 mg by mouth daily.  Marland Kitchen atorvastatin (LIPITOR) 80 MG tablet TAKE 1 TABLET BY MOUTH  DAILY  . Blood Glucose Monitoring Suppl (ACCU-CHEK GUIDE) w/Device KIT   . calcium-vitamin D (OSCAL-500) 500-400 MG-UNIT tablet Take 1 tablet by mouth daily.  . Continuous Blood Gluc Receiver (FREESTYLE LIBRE 2 READER) DEVI As directed  . Continuous Blood Gluc Sensor (FREESTYLE LIBRE 2 SENSOR) MISC 1 Piece by Does not apply route every 14 (fourteen) days.  Marland Kitchen desonide (DESOWEN) 0.05 % ointment Apply 1 application topically 2 (two) times daily. (Patient taking differently: Apply 1 application topically 2 (two) times daily as needed (skin irritation post poison ivy contact).)  . gabapentin (NEURONTIN) 100 MG capsule TAKE 1 CAPSULE BY MOUTH AT  BEDTIME  . insulin regular (HUMULIN R) 100 units/mL injection Inject 0.1 mLs  (10 Units total) into the skin 3 (three) times daily before meals.  . Insulin Syringe-Needle U-100 (BD INSULIN SYRINGE U/F) 31G X 5/16" 0.3 ML MISC USE AS DIRECTED  . JANUVIA 50 MG tablet TAKE 1 TABLET BY MOUTH  DAILY  . losartan (COZAAR) 50 MG tablet Take 50 mg by mouth daily.  . meloxicam (MOBIC) 15 MG tablet Take 1 tablet (15 mg total) by mouth daily as needed for pain.  . metFORMIN (GLUCOPHAGE) 1000 MG tablet TAKE 1 TABLET BY MOUTH  TWICE DAILY WITH MEALS  . mirabegron ER (MYRBETRIQ) 25 MG TB24 tablet Take 1 tablet (25 mg total) by mouth daily.  Marland Kitchen omega-3 acid ethyl esters (LOVAZA) 1 g capsule Take 2 g by mouth 2 (two) times daily.  . polyethylene glycol powder (GLYCOLAX/MIRALAX) 17 GM/SCOOP powder Take 17 g by mouth daily as needed for mild constipation or moderate constipation.   . [DISCONTINUED] glipiZIDE (GLUCOTROL) 5 MG tablet Take by mouth daily before breakfast.   No facility-administered encounter medications on file as of 07/31/2020.    Allergies (verified) Other   History: Past Medical History:  Diagnosis Date  . Bleeding disorder ([place])   . Cervical cancer ([place]) 2010  . Diabetes mellitus without complication ([place])    type 2  . Diabetes mellitus without complication ([place])   . Heart murmur   . High cholesterol   . HLD (hyperlipidemia)   . Hypertension   . Mental disorder    depression; suicidal ideation in 2017  .  Sleep apnea   . Vitamin D deficiency    Past Surgical History:  Procedure Laterality Date  . BILATERAL OOPHORECTOMY    . CESAREAN SECTION     x 4  . CHOLECYSTECTOMY    . COLOSTOMY     x2  . SALPINGECTOMY    . TONSILLECTOMY AND ADENOIDECTOMY     Family History  Problem Relation Age of Onset  . Cancer Father        kidney  . Cancer Mother        lung  . Other Brother        heart issues   Social History   Socioeconomic History  . Marital status: Divorced    Spouse name: Not on file  . Number of children: 3  . Years of education: Not on  file  . Highest education level: Not on file  Occupational History  . Occupation: retired  Tobacco Use  . Smoking status: Never Smoker  . Smokeless tobacco: Never Used  Substance and Sexual Activity  . Alcohol use: Never  . Drug use: Never  . Sexual activity: Not Currently    Birth control/protection: Post-menopausal  Other Topics Concern  . Not on file  Social History Narrative   ** Merged History Encounter **       Social Determinants of Health   Financial Resource Strain: Medium Risk  . Difficulty of Paying Living Expenses: Somewhat hard  Food Insecurity: Food Insecurity Present  . Worried About Charity fundraiser in the Last Year: Sometimes true  . Ran Out of Food in the Last Year: Sometimes true  Transportation Needs: No Transportation Needs  . Lack of Transportation (Medical): No  . Lack of Transportation (Non-Medical): No  Physical Activity: Insufficiently Active  . Days of Exercise per Week: 4 days  . Minutes of Exercise per Session: 30 min  Stress: No Stress Concern Present  . Feeling of Stress : Only a little  Social Connections: Socially Isolated  . Frequency of Communication with Friends and Family: More than three times a week  . Frequency of Social Gatherings with Friends and Family: Twice a week  . Attends Religious Services: Never  . Active Member of Clubs or Organizations: No  . Attends Archivist Meetings: Never  . Marital Status: Widowed    Tobacco Counseling Counseling given: Not Answered   Clinical Intake:  Pre-visit preparation completed: Yes  Pain : No/denies pain Pain Score: 0-No pain     Nutritional Status: BMI 25 -29 Overweight Diabetes: Yes CBG done?: No Did pt. bring in CBG monitor from home?: No  How often do you need to have someone help you when you read instructions, pamphlets, or other written materials from your doctor or pharmacy?: 1 - Never  Diabetic?yes  Interpreter Needed?: No      Activities of  Daily Living In your present state of health, do you have any difficulty performing the following activities: 07/31/2020 07/31/2020  Hearing? N N  Vision? N N  Difficulty concentrating or making decisions? N N  Walking or climbing stairs? N Y  Dressing or bathing? N N  Doing errands, shopping? Y N  Comment has service dog uses her Clinical cytogeneticist and eating ? N N  Using the Toilet? N N  In the past six months, have you accidently leaked urine? N Y  Do you have problems with loss of bowel control? N N  Managing your Medications? N N  Managing your  Finances? N N  Housekeeping or managing your Housekeeping? N N  Some recent data might be hidden    Patient Care Team: Lindell Spar, MD as PCP - General (Internal Medicine) Jani Gravel, MD (Internal Medicine)  Indicate any recent Medical Services you may have received from other than [facility] providers in the past year (date may be approximate).     Assessment:   This is a routine wellness examination for [place].  Hearing/Vision screen No exam data present  Dietary issues and exercise activities discussed: Current Exercise Habits: Home exercise routine, Time (Minutes): 30, Frequency (Times/Week): 4, Weekly Exercise (Minutes/Week): 120, Intensity: Mild, Exercise limited by: orthopedic condition(s)  Goals    . LIFESTYLE - DECREASE FALLS RISK      Depression Screen PHQ 2/9 Scores 06/14/2020 03/21/2020 03/08/2020 01/20/2018 12/19/2016 12/19/2016  PHQ - 2 Score 0 2 1 2 2 2   PHQ- 9 Score - 7 - 3 8 -    Fall Risk Fall Risk  07/31/2020 06/14/2020 03/21/2020 03/08/2020  Falls in the past year? 1 0 1 1  Number falls in past yr: 1 0 0 0  Injury with Fall? 0 0 0 0  Risk for fall due to : - No Fall Risks Impaired vision No Fall Risks  Follow up - Falls evaluation completed Falls evaluation completed Falls evaluation completed    [place]:  Any stairs in or around the home? No  If so, are there any  without handrails? No  Home free of loose throw rugs in walkways, pet beds, electrical cords, etc? yes Adequate lighting in your home to reduce risk of falls? Yes   ASSISTIVE DEVICES UTILIZED TO PREVENT FALLS:  Life alert? No  Use of a cane, walker or w/c? Yes  Grab bars in the bathroom? No  Shower chair or bench in shower? No  Elevated toilet seat or a handicapped toilet? No   TIMED UP AND GO:  Was the test performed? No .  Length of time to ambulate 10 feet:  sec.     Cognitive Function:     6CIT Screen 07/31/2020  What Year? 0 points  What month? 0 points  What time? 0 points  Count back from 20 0 points  Months in reverse 0 points  Repeat phrase 0 points  Total Score 0    Immunizations Immunization History  Administered Date(s) Administered  . Fluad Quad(high Dose 65+) 03/06/2020  . Moderna Sars-Covid-2 Vaccination 05/31/2019, 07/01/2019, 04/05/2020  . Zoster Recombinat (Shingrix) 03/10/2018, 06/22/2018    TDAP status: Up to date not covered as preventative   Flu Vaccine status: Up to date  Pneumococcal vaccine status: Up to date  Covid-19 vaccine status: Completed vaccines  Qualifies for Shingles Vaccine? Yes   Zostavax completed Yes   Shingrix Completed?: No.    Education has been provided regarding the importance of this vaccine. Patient has been advised to call insurance company to determine out of pocket expense if they have not yet received this vaccine. Advised may also receive vaccine at local pharmacy or Health Dept. Verbalized acceptance and understanding.  Screening Tests Health Maintenance  Topic Date Due  . Hepatitis C Screening  Never done  . OPHTHALMOLOGY EXAM  Never done  . TETANUS/TDAP  Never done  . DEXA SCAN  Never done  . COLONOSCOPY (Pts 45-23yr Insurance coverage will need to be confirmed)  03/08/2021 (Originally 04/20/1990)  . HEMOGLOBIN A1C  10/07/2020  . FOOT EXAM  06/14/2021  .  INFLUENZA VACCINE  Completed  . COVID-19 Vaccine   Completed  . HPV VACCINES  Aged Out  . PNA vac Low Risk Adult  Discontinued    Health Maintenance  Health Maintenance Due  Topic Date Due  . Hepatitis C Screening  Never done  . OPHTHALMOLOGY EXAM  Never done  . TETANUS/TDAP  Never done  . DEXA SCAN  Never done    Colorectal cancer screening: No longer required.   Mammogram status: Completed 12/28/19. Repeat every year  Bone Density status: Ordered yes. Pt provided with contact info and advised to call to schedule appt.  Lung Cancer Screening: (Low Dose CT Chest recommended if Age 109-80 years, 30 pack-year currently smoking OR have quit w/in 15years.) does not qualify.   Lung Cancer Screening Referral: doesn't qualify  Additional Screening:  Hepatitis C Screening: does qualify  Vision Screening: Recommended annual ophthalmology exams for early detection of glaucoma and other disorders of the eye. Is the patient up to date with their annual eye exam?  Yes  Who is the provider or what is the name of the office in which the patient attends annual eye exams?  If pt is not established with a provider, would they like to be referred to a provider to establish care? No .   Dental Screening: Recommended annual dental exams for proper oral hygiene  Community Resource Referral / Chronic Care Management: CRR required this visit?  no  CCM required this visit?  Yes      Plan:     I have personally reviewed and noted the following in the patient's chart:   . Medical and social history . Use of alcohol, tobacco or illicit drugs  . Current medications and supplements . Functional ability and status . Nutritional status . Physical activity . Advanced directives . List of other physicians . Hospitalizations, surgeries, and ER visits in previous 12 months . Vitals . Screenings to include cognitive, depression, and falls . Referrals and appointments  In addition, I have reviewed and discussed with patient certain preventive  protocols, quality metrics, and best practice recommendations. A written personalized care plan for preventive services as well as general preventive health recommendations were provided to patient.     Kate Sable, LPN, LPN   [id number]/61/4709   Nurse Notes: Visit performed by telephone. Patient at home and supervising provider in the office. Patient consents to telephone visit. Time spent with pt 25 mins.

## 2020-08-01 ENCOUNTER — Other Ambulatory Visit: Payer: Medicare Other | Admitting: Urology

## 2020-08-03 ENCOUNTER — Telehealth: Payer: Self-pay | Admitting: Internal Medicine

## 2020-08-03 NOTE — Telephone Encounter (Signed)
   Telephone encounter was:  Unsuccessful.  08/03/2020 Name: Jessica Mclean MRN: [id number] DOB: Jan 24, 1946  Unsuccessful outbound call made today to assist with:  Food Insecurity and Home Modifications  Outreach Attempt:  1st Attempt  A HIPAA compliant voice message was left requesting a return call.  Instructed patient to call back at [phone].  [place], Care Management Phone: [phone] Email: [email or web address]

## 2020-08-06 ENCOUNTER — Telehealth: Payer: Self-pay | Admitting: Internal Medicine

## 2020-08-06 NOTE — Telephone Encounter (Signed)
   Telephone encounter was:  Successful.  08/06/2020 Name: Jessica Mclean MRN: [id number] DOB: 1946-03-17  Jessica Mclean is a 75 y.o. year old female who is a primary care patient of Lindell Spar, MD . The community resource team was consulted for assistance with Home Modifications and Financial Difficulties related to financial difficulty and food insecurities  Care guide performed the following interventions: Patient provided with information about care guide support team and interviewed to confirm resource needs Discussed resources to assist with food insecurity, she needs to call DSS and speak to her case manager about her upping her food stamp amount. Also send her a list of local food banks. We went over her bills to see where she is "leaking" money each month shince she doesn't have a house payments. Patient will need to call [employer] for  them to come and help her with the door. .  Follow Up Plan:  Care guide will follow up with patient by phone over the next week and Client will call her DSS case manager and [employer] for the house repairs.   [place], Care Management Phone: [phone] Email: [email or web address]

## 2020-08-17 ENCOUNTER — Other Ambulatory Visit: Payer: Medicare Other | Admitting: Urology

## 2020-08-17 DIAGNOSIS — N3941 Urge incontinence: Secondary | ICD-10-CM

## 2020-08-21 ENCOUNTER — Telehealth: Payer: Self-pay | Admitting: Internal Medicine

## 2020-08-21 NOTE — Telephone Encounter (Signed)
   Telephone encounter was:  Successful.  08/21/2020 Name: Jessica Mclean MRN: [id number] DOB: [birth year]  Jessica Mclean is a 75 y.o. year old female who is a primary care patient of Lindell Spar, MD . The community resource team was consulted for assistance with Home Modifications  Care guide performed the following interventions: Follow up call placed to the patient to discuss status of referral spoke to patient and she said that the church group came and fixed her back door and also her bathroom door that was off the hinges for a while. .  Follow Up Plan:  No further follow up planned at this time. The patient has been provided with needed resources.  [place], Care Management Phone: [phone] Email: [email or web address]

## 2020-08-30 ENCOUNTER — Inpatient Hospital Stay (HOSPITAL_COMMUNITY): Admission: RE | Admit: 2020-08-30 | Payer: Medicare Other | Source: Ambulatory Visit

## 2020-08-30 DIAGNOSIS — E1165 Type 2 diabetes mellitus with hyperglycemia: Secondary | ICD-10-CM | POA: Diagnosis not present

## 2020-09-06 ENCOUNTER — Other Ambulatory Visit: Payer: Self-pay

## 2020-09-06 ENCOUNTER — Ambulatory Visit (HOSPITAL_COMMUNITY)
Admission: RE | Admit: 2020-09-06 | Discharge: 2020-09-06 | Disposition: A | Discharge status: Home / Self Care | Payer: Medicare Other | Source: Ambulatory Visit | Attending: Internal Medicine | Admitting: Internal Medicine

## 2020-09-06 DIAGNOSIS — Z780 Asymptomatic menopausal state: Secondary | ICD-10-CM | POA: Insufficient documentation

## 2020-09-06 DIAGNOSIS — Z13820 Encounter for screening for osteoporosis: Secondary | ICD-10-CM | POA: Diagnosis not present

## 2020-09-10 ENCOUNTER — Encounter: Payer: Self-pay | Admitting: *Deleted

## 2020-09-12 ENCOUNTER — Other Ambulatory Visit: Payer: Medicare Other | Admitting: Urology

## 2020-09-26 ENCOUNTER — Other Ambulatory Visit: Payer: Medicare Other | Admitting: Urology

## 2020-09-30 DIAGNOSIS — E1165 Type 2 diabetes mellitus with hyperglycemia: Secondary | ICD-10-CM | POA: Diagnosis not present

## 2020-10-10 ENCOUNTER — Ambulatory Visit: Payer: Medicare Other | Admitting: Internal Medicine

## 2020-10-18 DIAGNOSIS — Z23 Encounter for immunization: Secondary | ICD-10-CM | POA: Diagnosis not present

## 2020-10-23 ENCOUNTER — Telehealth: Payer: Self-pay | Admitting: Urology

## 2020-10-23 NOTE — Telephone Encounter (Signed)
Patient called and has a follow up on 11/07/2020 with you and she reports that she has used her Myrbetriq and does not have any more refills at this time.  She reports that has took AZO and the Excedrin Migraine that helps some but not a lot.  She wants to know if you are willing to send in more of the Myrbitriq and some pyridium as she was told it may be stronger than the AZO. Do you have any other recommendations? She does not want pain medication. Please advise.

## 2020-10-25 MED ORDER — PHENAZOPYRIDINE HCL 100 MG PO TABS: 100.0000 mg | ORAL_TABLET | Freq: Three times a day (TID) | ORAL | 0 refills | Status: DC | PRN

## 2020-10-25 NOTE — Telephone Encounter (Signed)
McKenzie, Candee Furbish, MD  You 1 hour ago (9:39 AM)     Please have her get samples of mirabegron 25mg  to last here until her appointment. Also send in pyridium 100mg  TID PRN #[id number]    I called the patient back and she is aware. She will arrange transportation for her next visit and get the samples at that visit.

## 2020-10-31 DIAGNOSIS — E1165 Type 2 diabetes mellitus with hyperglycemia: Secondary | ICD-10-CM | POA: Diagnosis not present

## 2020-11-07 ENCOUNTER — Ambulatory Visit (INDEPENDENT_AMBULATORY_CARE_PROVIDER_SITE_OTHER): Payer: Medicare Other | Admitting: Urology

## 2020-11-07 ENCOUNTER — Other Ambulatory Visit: Payer: Self-pay

## 2020-11-07 VITALS — BP 153/86 | HR 70 | Ht 64.5 in | Wt 160.0 lb

## 2020-11-07 DIAGNOSIS — R310 Gross hematuria: Secondary | ICD-10-CM

## 2020-11-07 LAB — MICROSCOPIC EXAMINATION
RBC, Urine: NONE SEEN /hpf (ref 0–2)
Renal Epithel, UA: NONE SEEN /hpf

## 2020-11-07 LAB — URINALYSIS, ROUTINE W REFLEX MICROSCOPIC
Bilirubin, UA: NEGATIVE
Glucose, UA: NEGATIVE
Leukocytes,UA: NEGATIVE
Nitrite, UA: POSITIVE — AB
RBC, UA: NEGATIVE
Specific Gravity, UA: >1.03 — ABNORMAL HIGH (ref 1.005–1.030)
Urobilinogen, Ur: 0.2 mg/dL (ref 0.2–1.0)
pH, UA: 5.5 (ref 5.0–7.5)

## 2020-11-07 MED ORDER — CIPROFLOXACIN HCL 500 MG PO TABS
500.0000 mg | ORAL_TABLET | Freq: Once | ORAL | Status: AC
  Administered 2020-11-07: 500 mg via ORAL

## 2020-11-07 NOTE — Patient Instructions (Signed)
Surgery scheduled for 11/15/2020 at [facility] will call you to schedule your pre op appt and give you your hospital arrival information.

## 2020-11-07 NOTE — Progress Notes (Signed)
   11/07/20  CC: weak urinary stream   HPI: Ms Noy is a 75yo here for followup for a weak urianry stream.  Blood pressure (!) 153/86, pulse 70, height 5' 4.5" (1.638 m), weight 160 lb (72.6 kg). NED. A&Ox3.   No respiratory distress   Abd soft, NT, ND Normal external genitalia with patent urethral meatus  Cystoscopy Procedure Note  Patient identification was confirmed, informed consent was obtained, and patient was prepped using Betadine solution.  Lidocaine jelly was administered per urethral meatus.    Procedure: - Flexible cystoscope introduced, but was unable to be advanced into the urethra due to a severe urethral stricture.   Post-Procedure: - Patient tolerated the procedure well  Assessment/ Plan: We discussed urethral dilation and cystoscopy under general anesthesia and the patient wishes to proceed. Risks/benefits/alternatives discussed   No follow-ups on file.  Nicolette Bang, MD

## 2020-11-07 NOTE — H&P (View-Only) (Signed)
   11/07/20  CC: weak urinary stream   HPI: Jessica Mclean is a 74yo here for followup for a weak urianry stream.  Blood pressure (!) 153/86, pulse 70, height 5' 4.5" (1.638 m), weight 160 lb (72.6 kg). NED. A&Ox3.   No respiratory distress   Abd soft, NT, ND Normal external genitalia with patent urethral meatus  Cystoscopy Procedure Note  Patient identification was confirmed, informed consent was obtained, and patient was prepped using Betadine solution.  Lidocaine jelly was administered per urethral meatus.    Procedure: - Flexible cystoscope introduced, but was unable to be advanced into the urethra due to a severe urethral stricture.   Post-Procedure: - Patient tolerated the procedure well  Assessment/ Plan: We discussed urethral dilation and cystoscopy under general anesthesia and the patient wishes to proceed. Risks/benefits/alternatives discussed   No follow-ups on file.  Nicolette Bang, MD

## 2020-11-07 NOTE — Progress Notes (Signed)
Urological Symptom Review  Patient is experiencing the following symptoms: Burning/pain with urination Leakage of urine Blood in urine Urinary tract infection   Review of Systems  Gastrointestinal (upper)  : Nausea Vomiting  Gastrointestinal (lower) : Diarrhea  Constitutional : Fatigue  Skin: Negative for skin symptoms  Eyes: Blurred vision  Ear/Nose/Throat : Negative for Ear/Nose/Throat symptoms  Hematologic/Lymphatic: Negative for Hematologic/Lymphatic symptoms  Cardiovascular : Negative for cardiovascular symptoms  Respiratory : Cough  Endocrine: Negative for endocrine symptoms  Musculoskeletal: Back pain  Neurological: Headaches  Psychologic: Depression Anxiety

## 2020-11-08 ENCOUNTER — Encounter: Payer: Self-pay | Admitting: Internal Medicine

## 2020-11-08 ENCOUNTER — Ambulatory Visit (INDEPENDENT_AMBULATORY_CARE_PROVIDER_SITE_OTHER): Payer: Medicare Other | Admitting: Internal Medicine

## 2020-11-08 VITALS — BP 158/74 | HR 89 | Temp 98.3°F | Resp 18 | Ht 64.5 in | Wt 183.1 lb

## 2020-11-08 DIAGNOSIS — Z1159 Encounter for screening for other viral diseases: Secondary | ICD-10-CM | POA: Diagnosis not present

## 2020-11-08 DIAGNOSIS — I10 Essential (primary) hypertension: Secondary | ICD-10-CM

## 2020-11-08 DIAGNOSIS — Z1211 Encounter for screening for malignant neoplasm of colon: Secondary | ICD-10-CM

## 2020-11-08 NOTE — Patient Instructions (Signed)
Please continue to take medications as prescribed.  Please continue to follow low carb and low salt diet and ambulate as tolerated.  Please get fasting blood tests done before the next visit.  Please bring CGM device in the next visit.

## 2020-11-08 NOTE — Assessment & Plan Note (Signed)
BP Readings from Last 1 Encounters:  11/08/20 (!) 158/74   Uncontrolled due to missed dose Losartan 50 mg QD and Amlodipine 10 mg QD Counseled for compliance with the medications Advised DASH diet and moderate exercise/walking, at least 150 mins/week Advised to check BP at home and bring the log in next visit

## 2020-11-08 NOTE — Progress Notes (Signed)
Acute Office Visit  Subjective:    Patient ID: Jessica Mclean, female    DOB: 12/07/1945, 75 y.o.   MRN: [id number]  Chief Complaint  Patient presents with   Follow-up    Follow up pt needs colonoscopy referral     HPI Patient is in today for evaluation for screening for colon cancer. She has 2 polyps during the last colonoscopy, more than 5 years ago. Denies having melena or hematochezia.  Her BP was elevated today, but she has not had her medications today. She came walking to the office, and has been having headache due to excessive heat. Denies any chest pain, dyspnea or palpitations.  Past Medical History:  Diagnosis Date   Bleeding disorder ([place])    Cervical cancer ([place]) 2010   Diabetes mellitus without complication ([place])    type 2   Diabetes mellitus without complication ([place])    Heart murmur    High cholesterol    HLD (hyperlipidemia)    Hypertension    Mental disorder    depression; suicidal ideation in 2017   Sleep apnea    Vitamin D deficiency     Past Surgical History:  Procedure Laterality Date   BILATERAL OOPHORECTOMY     CESAREAN SECTION     x 4   CHOLECYSTECTOMY     COLOSTOMY     x2   SALPINGECTOMY     TONSILLECTOMY AND ADENOIDECTOMY      Family History  Problem Relation Age of Onset   Cancer Father        kidney   Cancer Mother        lung   Other Brother        heart issues    Social History   Socioeconomic History   Marital status: Divorced    Spouse name: Not on file   Number of children: 3   Years of education: Not on file   Highest education level: Not on file  Occupational History   Occupation: retired  Tobacco Use   Smoking status: Never   Smokeless tobacco: Never  Substance and Sexual Activity   Alcohol use: Never   Drug use: Never   Sexual activity: Not Currently    Birth control/protection: Post-menopausal  Other Topics Concern   Not on file  Social History Narrative   ** Merged History Encounter **        Social Determinants of Health   Financial Resource Strain: Medium Risk   Difficulty of Paying Living Expenses: Somewhat hard  Food Insecurity: Landscape architect Present   Worried About Charity fundraiser in the Last Year: Sometimes true   [place] in the Last Year: Sometimes true  Transportation Needs: No Transportation Needs   Lack of Transportation (Medical): No   Lack of Transportation (Non-Medical): No  Physical Activity: Insufficiently Active   Days of Exercise per Week: 4 days   Minutes of Exercise per Session: 30 min  Stress: No Stress Concern Present   Feeling of Stress : Only a little  Social Connections: Socially Isolated   Frequency of Communication with Friends and Family: More than three times a week   Frequency of Social Gatherings with Friends and Family: Twice a week   Attends Religious Services: Never   Marine scientist or Organizations: No   Attends Archivist Meetings: Never   Marital Status: Widowed  Intimate Partner Violence: Not on file    Outpatient Medications Prior to Visit  Medication  Sig Dispense Refill   ACCU-CHEK AVIVA PLUS test strip      Accu-Chek Softclix Lancets lancets SMARTSIG:Topical     amLODipine (NORVASC) 10 MG tablet Take 10 mg by mouth daily.     aspirin EC 325 MG EC tablet Take 1 tablet (325 mg total) by mouth daily. Swallow whole. (Patient taking differently: Take 325 mg by mouth daily. Swallow whole.) 30 tablet 1   atorvastatin (LIPITOR) 40 MG tablet Take 40 mg by mouth daily.     atorvastatin (LIPITOR) 80 MG tablet TAKE 1 TABLET BY MOUTH  DAILY 60 tablet 5   Blood Glucose Monitoring Suppl (ACCU-CHEK GUIDE) w/Device KIT      calcium-vitamin D (OSCAL-500) 500-400 MG-UNIT tablet Take 1 tablet by mouth daily.     Continuous Blood Gluc Receiver (FREESTYLE LIBRE 2 READER) DEVI As directed 1 each 0   Continuous Blood Gluc Sensor (FREESTYLE LIBRE 2 SENSOR) MISC 1 Piece by Does not apply route every 14 (fourteen) days.  2 each 3   desonide (DESOWEN) 0.05 % ointment Apply 1 application topically 2 (two) times daily. (Patient taking differently: Apply 1 application topically 2 (two) times daily as needed (skin irritation post poison ivy contact).) 15 g 0   insulin regular (HUMULIN R) 100 units/mL injection Inject 0.1 mLs (10 Units total) into the skin 3 (three) times daily before meals. 10 mL 5   Insulin Syringe-Needle U-100 (BD INSULIN SYRINGE U/F) 31G X 5/16" 0.3 ML MISC USE AS DIRECTED 100 each 0   JANUVIA 50 MG tablet TAKE 1 TABLET BY MOUTH  DAILY 90 tablet 3   losartan (COZAAR) 50 MG tablet Take 50 mg by mouth daily.     meloxicam (MOBIC) 15 MG tablet Take 1 tablet (15 mg total) by mouth daily as needed for pain.     metFORMIN (GLUCOPHAGE) 1000 MG tablet TAKE 1 TABLET BY MOUTH  TWICE DAILY WITH MEALS 180 tablet 3   mirabegron ER (MYRBETRIQ) 25 MG TB24 tablet Take 1 tablet (25 mg total) by mouth daily. 30 tablet 11   omega-3 acid ethyl esters (LOVAZA) 1 g capsule Take 2 g by mouth 2 (two) times daily.     phenazopyridine (PYRIDIUM) 100 MG tablet Take 1 tablet (100 mg total) by mouth 3 (three) times daily as needed for pain. 15 tablet 0   polyethylene glycol powder (GLYCOLAX/MIRALAX) 17 GM/SCOOP powder Take 17 g by mouth daily as needed for mild constipation or moderate constipation.      gabapentin (NEURONTIN) 100 MG capsule TAKE 1 CAPSULE BY MOUTH AT  BEDTIME (Patient not taking: No sig reported) 90 capsule 3   No facility-administered medications prior to visit.    Allergies  Allergen Reactions   Other     Nickel staples    Review of Systems  Constitutional:  Negative for chills and fever.  HENT:  Negative for congestion, sinus pressure, sinus pain and sore throat.   Eyes:  Negative for pain and discharge.  Respiratory:  Negative for cough and shortness of breath.   Cardiovascular:  Negative for chest pain and palpitations.  Gastrointestinal:  Negative for abdominal pain, constipation, diarrhea,  nausea and vomiting.  Endocrine: Negative for polydipsia and polyuria.  Genitourinary:  Positive for frequency and urgency. Negative for dysuria, flank pain and hematuria.  Musculoskeletal:  Negative for neck pain and neck stiffness.  Skin:  Negative for rash.  Neurological:  Positive for headaches. Negative for dizziness, syncope, speech difficulty, weakness and numbness.  Psychiatric/Behavioral:  Negative for  agitation and behavioral problems.       Objective:    Physical Exam Vitals reviewed.  Constitutional:      General: She is not in acute distress.    Appearance: She is not diaphoretic.  HENT:     Head: Normocephalic and atraumatic.     Nose: Nose normal.     Mouth/Throat:     Mouth: Mucous membranes are moist.  Eyes:     General: No scleral icterus.    Extraocular Movements: Extraocular movements intact.     Pupils: Pupils are equal, round, and reactive to light.  Cardiovascular:     Rate and Rhythm: Normal rate and regular rhythm.     Pulses: Normal pulses.     Heart sounds: Normal heart sounds. No murmur heard.   No friction rub. No gallop.  Pulmonary:     Breath sounds: Normal breath sounds. No wheezing or rales.  Abdominal:     Palpations: Abdomen is soft.     Tenderness: There is no abdominal tenderness.  Musculoskeletal:     Cervical back: Neck supple. No tenderness.     Right lower leg: No edema.     Left lower leg: No edema.  Skin:    General: Skin is warm.     Findings: No bruising.  Neurological:     General: No focal deficit present.     Mental Status: She is alert and oriented to person, place, and time.     Sensory: No sensory deficit.     Motor: No weakness.  Psychiatric:        Mood and Affect: Mood normal.        Behavior: Behavior normal.    BP (!) 158/74 (BP Location: Left Arm, Patient Position: Sitting, Cuff Size: Normal)   Pulse 89   Temp 98.3 F (36.8 C) (Oral)   Resp 18   Ht 5' 4.5" (1.638 m)   Wt 183 lb 1.9 oz (83.1 kg)   LMP   (LMP Unknown)   SpO2 97%   BMI 30.95 kg/m  Wt Readings from Last 3 Encounters:  11/08/20 183 lb 1.9 oz (83.1 kg)  11/07/20 160 lb (72.6 kg)  07/31/20 160 lb (72.6 kg)    Health Maintenance Due  Topic Date Due   Hepatitis C Screening  Never done   TETANUS/TDAP  Never done   COVID-19 Vaccine (4 - Booster for Moderna series) 07/06/2020   HEMOGLOBIN A1C  10/07/2020    There are no preventive care reminders to display for this patient.   No results found for: TSH Lab Results  Component Value Date   WBC 5.7 12/11/2019   HGB 11.6 (L) 12/11/2019   HCT 34.0 (L) 12/11/2019   MCV 84.8 12/11/2019   PLT 297 12/11/2019   Lab Results  Component Value Date   NA 138 12/11/2019   K 4.8 12/11/2019   CO2 24 12/11/2019   GLUCOSE 174 (H) 12/11/2019   BUN 14 12/11/2019   CREATININE 0.90 12/11/2019   BILITOT 0.7 12/11/2019   ALKPHOS 72 12/11/2019   AST 26 12/11/2019   ALT 25 12/11/2019   PROT 6.8 12/11/2019   ALBUMIN 3.7 12/11/2019   CALCIUM 9.6 12/11/2019   ANIONGAP 12 12/11/2019   Lab Results  Component Value Date   CHOL 194 11/23/2019   Lab Results  Component Value Date   HDL 34 (L) 11/23/2019   Lab Results  Component Value Date   LDLCALC 102 (H) 11/23/2019   Lab Results  Component Value Date   TRIG 291 (H) 11/23/2019   Lab Results  Component Value Date   CHOLHDL 5.7 11/23/2019   Lab Results  Component Value Date   HGBA1C 7.9 (A) 04/09/2020       Assessment & Plan:   Problem List Items Addressed This Visit       Cardiovascular and Mediastinum   Essential hypertension    BP Readings from Last 1 Encounters:  11/08/20 (!) 158/74  Uncontrolled due to missed dose Losartan 50 mg QD and Amlodipine 10 mg QD Counseled for compliance with the medications Advised DASH diet and moderate exercise/walking, at least 150 mins/week Advised to check BP at home and bring the log in next visit       Other Visit Diagnoses     Special screening for malignant  neoplasms, colon    -  Primary   Relevant Orders   Ambulatory referral to Gastroenterology   Need for hepatitis C screening test       Relevant Orders   Hepatitis C Antibody        No orders of the defined types were placed in this encounter.    Lindell Spar, MD

## 2020-11-09 ENCOUNTER — Telehealth: Payer: Self-pay

## 2020-11-09 LAB — URINE CULTURE

## 2020-11-09 MED ORDER — NITROFURANTOIN MONOHYD MACRO 100 MG PO CAPS: 100.0000 mg | ORAL_CAPSULE | Freq: Two times a day (BID) | ORAL | 0 refills | Status: DC

## 2020-11-09 NOTE — Patient Instructions (Addendum)
Katia Hannen  11/09/2020     @PREFPERIOPPHARMACY @   Your procedure is scheduled on  11/15/2020.   Report to [facility] at  0730 A.M.   Call this number if you have problems the morning of surgery:  [phone]   Remember:  Do not eat or drink after midnight.   DO NOT take any medications for diabetes the morning of your procedure.      Take these medicines the morning of surgery with A SIP OF WATER     amlodipine, mobic (if needed), pyrdium (if needed).     Please brush your teeth.  Do not wear jewelry, make-up or nail polish.  Do not wear lotions, powders, or perfumes, or deodorant.  Do not shave 48 hours prior to surgery.  Men may shave face and neck.  Do not bring valuables to the hospital.  [facility] is not responsible for any belongings or valuables.  Contacts, dentures or bridgework may not be worn into surgery.  Leave your suitcase in the car.  After surgery it may be brought to your room.  For patients admitted to the hospital, discharge time will be determined by your treatment team.  Patients discharged the day of surgery will not be allowed to drive home and must have someone with them for 24 hours.    Special instructions:   DO NOT smoke tobacco or vape for 24 hours before your procedure.  Please read over the following fact sheets that you were given. Coughing and Deep Breathing, Surgical Site Infection Prevention, Anesthesia Post-op Instructions, and Care and Recovery After Surgery      Urethral Dilation  Urethral dilation is a procedure to stretch open (dilate) the urethra. The urethra is the tube that drains urine from the bladder out of the body. In women, the urethra opens above the vaginal opening. In men, the urethra opens at the tip of the penis. Urethral dilation is usually done to treat narrowing of the urethra (urethral stricture), which can make it difficult to pass urine. Urethral dilation widens theurethra so that you can pass  urine normally. Urethral dilation is done through the urethral opening. There are no incisionsmade during the procedure. Tell a health care provider about: Any allergies you have. All medicines you are taking, including vitamins, herbs, eye drops, creams, and over-the-counter medicines. Any problems you or family members have had with anesthetic medicines. Any blood disorders you have. Any surgeries you have had. Any medical conditions you have. Whether you are pregnant or may be pregnant. What are the risks? Generally, this is a safe procedure. However, problems may occur, including: Bleeding. Infection. A return of urethral stricture, which requires repeating the dilation procedure. Damage to the urethra, which may require reconstructive surgery. Allergic reactions to medicines. What happens before the procedure? Medicines Ask your health care provider about: Changing or stopping your regular medicines. This is especially important if you are taking diabetes medicines or blood thinners. Taking medicines such as aspirin and ibuprofen. These medicines can thin your blood. Do not take these medicines unless your health care provider tells you to take them. Taking over-the-counter medicines, vitamins, herbs, and supplements. General instructions Follow instructions from your health care provider about eating or drinking restrictions. Plan to have someone take you home from the hospital or clinic. If you will be going home right after the procedure, plan to have someone with you for 24 hours. Ask your health care provider what steps will be  taken to help prevent infection. These may include: Washing skin with a germ-killing soap. Taking antibiotic medicine. What happens during the procedure? An IV may be inserted into one of your veins. You will be given one or more of the following medicines: A local anesthetic to numb your urethral opening. This will be applied as a gel that will also  lubricate the urethral opening. A sedative to help you relax. A thin tube with a light and camera on the end (cystoscope) will be inserted into your urethra. Your urethra will be rinsed (irrigated) with a germ-free (sterile) water solution. Narrow parts of your urethra will be stretched open using a dilator tool. Your surgeon will start with a very thin dilator, then use wider dilators as needed. A thin tube with an inflatable balloon on the tip may be inserted into your urethra. The balloon may be inflated to help stretch your urethra open. Your urethra will be irrigated. The procedure may vary among health care providers and hospitals. What can I expect after the procedure? After the procedure, it is common to have: Burning pain when urinating. Blood in your urine. A need to urinate frequently. You will be asked to urinate before you leave the hospital or clinic. Your urine flow should improve within a few days. Follow these instructions at home: Medicines Take over-the-counter and prescription medicines only as told by your health care provider. If you were prescribed an antibiotic medicine, take it as told by your health care provider. Do not stop taking the antibiotic even if you start to feel better. Ask your health care provider if the medicine prescribed to you: Requires you to avoid driving or using heavy machinery. Can cause constipation. You may need to take these actions to prevent or treat constipation: Take over-the-counter or prescription medicines. Eat foods that are high in fiber, such as beans, whole grains, and fresh fruits and vegetables. Limit foods that are high in fat and processed sugars, such as fried or sweet foods. General instructions Do not drive for 24 hours if you were given a sedative during your procedure. If you were sent home with a small, lubricated tube (catheter) to help keep your urethra open, follow your health care provider's instructions about how  and when to use it. Drink enough fluid to keep your urine pale yellow. Return to your normal activities as told by your health care provider. Ask your health care provider what activities are safe for you. Keep all follow-up visits as told by your health care provider. This is important. Contact a health care provider if: Your urine is cloudy and smells bad. You develop new bleeding when you urinate. You pass blood clots when you urinate. You have pain that does not get better with medicine. You have a fever. You have swelling, bruising, or discoloration of your genital area. This includes the penis, scrotum, and inner thighs for men, and the outer genital organs (vulva) and inner thighs for women. Get help right away if: You develop new bleeding that does not stop. You cannot pass urine. Summary Urethral dilation is a procedure to stretch open (dilate) the urethra. Urethral dilation is usually done to treat narrowing of the urethra (urethral stricture), which can make it difficult to pass urine. Ask your health care provider about changing or stopping your regular medicines before the procedure. After the procedure, it is common to have burning pain when urinating, blood in your urine, and a need to urinate frequently. This information is not  intended to replace advice given to you by your health care provider. Make sure you discuss any questions you have with your healthcare provider. Document Revised: 06/17/2018 Document Reviewed: 06/17/2018 Elsevier Patient Education  2022 [place] Anesthesia, Adult, Care After This sheet gives you information about how to care for yourself after your procedure. Your health care provider may also give you more specific instructions. If you have problems or questions, contact your health careprovider. What can I expect after the procedure? After the procedure, the following side effects are common: Pain or discomfort at the IV  site. Nausea. Vomiting. Sore throat. Trouble concentrating. Feeling cold or chills. Feeling weak or tired. Sleepiness and fatigue. Soreness and body aches. These side effects can affect parts of the body that were not involved in surgery. Follow these instructions at home: For the time period you were told by your health care provider:  Rest. Do not participate in activities where you could fall or become injured. Do not drive or use machinery. Do not drink alcohol. Do not take sleeping pills or medicines that cause drowsiness. Do not make important decisions or sign legal documents. Do not take care of children on your own.  Eating and drinking Follow any instructions from your health care provider about eating or drinking restrictions. When you feel hungry, start by eating small amounts of foods that are soft and easy to digest (bland), such as toast. Gradually return to your regular diet. Drink enough fluid to keep your urine pale yellow. If you vomit, rehydrate by drinking water, juice, or clear broth. General instructions If you have sleep apnea, surgery and certain medicines can increase your risk for breathing problems. Follow instructions from your health care provider about wearing your sleep device: Anytime you are sleeping, including during daytime naps. While taking prescription pain medicines, sleeping medicines, or medicines that make you drowsy. Have a responsible adult stay with you for the time you are told. It is important to have someone help care for you until you are awake and alert. Return to your normal activities as told by your health care provider. Ask your health care provider what activities are safe for you. Take over-the-counter and prescription medicines only as told by your health care provider. If you smoke, do not smoke without supervision. Keep all follow-up visits as told by your health care provider. This is important. Contact a health care  provider if: You have nausea or vomiting that does not get better with medicine. You cannot eat or drink without vomiting. You have pain that does not get better with medicine. You are unable to pass urine. You develop a skin rash. You have a fever. You have redness around your IV site that gets worse. Get help right away if: You have difficulty breathing. You have chest pain. You have blood in your urine or stool, or you vomit blood. Summary After the procedure, it is common to have a sore throat or nausea. It is also common to feel tired. Have a responsible adult stay with you for the time you are told. It is important to have someone help care for you until you are awake and alert. When you feel hungry, start by eating small amounts of foods that are soft and easy to digest (bland), such as toast. Gradually return to your regular diet. Drink enough fluid to keep your urine pale yellow. Return to your normal activities as told by your health care provider. Ask your health care provider what  activities are safe for you. This information is not intended to replace advice given to you by your health care provider. Make sure you discuss any questions you have with your healthcare provider. Document Revised: 01/19/2020 Document Reviewed: 08/18/2019 Elsevier Patient Education  2022 [place]. How to Use Chlorhexidine for Bathing Chlorhexidine gluconate (CHG) is a germ-killing (antiseptic) solution that is used to clean the skin. It can get rid of the bacteria that normally live on the skin and can keep them away for about 24 hours. To clean your skin with CHG, you may be given: A CHG solution to use in the shower or as part of a sponge bath. A prepackaged cloth that contains CHG. Cleaning your skin with CHG may help lower the risk for infection: While you are staying in the intensive care unit of the hospital. If you have a vascular access, such as a central line, to provide short-term or  long-term access to your veins. If you have a catheter to drain urine from your bladder. If you are on a ventilator. A ventilator is a machine that helps you breathe by moving air in and out of your lungs. After surgery. What are the risks? Risks of using CHG include: A skin reaction. Hearing loss, if CHG gets in your ears. Eye injury, if CHG gets in your eyes and is not rinsed out. The CHG product catching fire. Make sure that you avoid smoking and flames after applying CHG to your skin. Do not use CHG: If you have a chlorhexidine allergy or have previously reacted to chlorhexidine. On babies younger than 58 months of age. How to use CHG solution Use CHG only as told by your health care provider, and follow the instructions on the label. Use the full amount of CHG as directed. Usually, this is one bottle. During a shower Follow these steps when using CHG solution during a shower (unless your health care provider gives you different instructions): Start the shower. Use your normal soap and shampoo to wash your face and hair. Turn off the shower or move out of the shower stream. Pour the CHG onto a clean washcloth. Do not use any type of brush or rough-edged sponge. Starting at your neck, lather your body down to your toes. Make sure you follow these instructions: If you will be having surgery, pay special attention to the part of your body where you will be having surgery. Scrub this area for at least 1 minute. Do not use CHG on your head or face. If the solution gets into your ears or eyes, rinse them well with water. Avoid your genital area. Avoid any areas of skin that have broken skin, cuts, or scrapes. Scrub your back and under your arms. Make sure to wash skin folds. Let the lather sit on your skin for 1-2 minutes or as long as told by your health care provider. Thoroughly rinse your entire body in the shower. Make sure that all body creases and crevices are rinsed well. Dry off  with a clean towel. Do not put any substances on your body afterward--such as powder, lotion, or perfume--unless you are told to do so by your health care provider. Only use lotions that are recommended by the manufacturer. Put on clean clothes or pajamas. If it is the night before your surgery, sleep in clean sheets.  During a sponge bath Follow these steps when using CHG solution during a sponge bath (unless your health care provider gives you different instructions): Use  your normal soap and shampoo to wash your face and hair. Pour the CHG onto a clean washcloth. Starting at your neck, lather your body down to your toes. Make sure you follow these instructions: If you will be having surgery, pay special attention to the part of your body where you will be having surgery. Scrub this area for at least 1 minute. Do not use CHG on your head or face. If the solution gets into your ears or eyes, rinse them well with water. Avoid your genital area. Avoid any areas of skin that have broken skin, cuts, or scrapes. Scrub your back and under your arms. Make sure to wash skin folds. Let the lather sit on your skin for 1-2 minutes or as long as told by your health care provider. Using a different clean, wet washcloth, thoroughly rinse your entire body. Make sure that all body creases and crevices are rinsed well. Dry off with a clean towel. Do not put any substances on your body afterward--such as powder, lotion, or perfume--unless you are told to do so by your health care provider. Only use lotions that are recommended by the manufacturer. Put on clean clothes or pajamas. If it is the night before your surgery, sleep in clean sheets. How to use CHG prepackaged cloths Only use CHG cloths as told by your health care provider, and follow the instructions on the label. Use the CHG cloth on clean, dry skin. Do not use the CHG cloth on your head or face unless your health care provider tells you to. When  washing with the CHG cloth: Avoid your genital area. Avoid any areas of skin that have broken skin, cuts, or scrapes. Before surgery Follow these steps when using a CHG cloth to clean before surgery (unless your health care provider gives you different instructions): Using the CHG cloth, vigorously scrub the part of your body where you will be having surgery. Scrub using a back-and-forth motion for 3 minutes. The area on your body should be completely wet with CHG when you are done scrubbing. Do not rinse. Discard the cloth and let the area air-dry. Do not put any substances on the area afterward, such as powder, lotion, or perfume. Put on clean clothes or pajamas. If it is the night before your surgery, sleep in clean sheets.  For general bathing Follow these steps when using CHG cloths for general bathing (unless your health care provider gives you different instructions). Use a separate CHG cloth for each area of your body. Make sure you wash between any folds of skin and between your fingers and toes. Wash your body in the following order, switching to a new cloth after each step: The front of your neck, shoulders, and chest. Both of your arms, under your arms, and your hands. Your stomach and groin area, avoiding the genitals. Your right leg and foot. Your left leg and foot. The back of your neck, your back, and your buttocks. Do not rinse. Discard the cloth and let the area air-dry. Do not put any substances on your body afterward--such as powder, lotion, or perfume--unless you are told to do so by your health care provider. Only use lotions that are recommended by the manufacturer. Put on clean clothes or pajamas. Contact a health care provider if: Your skin gets irritated after scrubbing. You have questions about using your solution or cloth. Get help right away if: Your eyes become very red or swollen. Your eyes itch badly. Your skin itches badly  and is red or swollen. Your  hearing changes. You have trouble seeing. You have swelling or tingling in your mouth or throat. You have trouble breathing. You swallow any chlorhexidine. Summary Chlorhexidine gluconate (CHG) is a germ-killing (antiseptic) solution that is used to clean the skin. Cleaning your skin with CHG may help to lower your risk for infection. You may be given CHG to use for bathing. It may be in a bottle or in a prepackaged cloth to use on your skin. Carefully follow your health care provider's instructions and the instructions on the product label. Do not use CHG if you have a chlorhexidine allergy. Contact your health care provider if your skin gets irritated after scrubbing. This information is not intended to replace advice given to you by your health care provider. Make sure you discuss any questions you have with your healthcare provider. Document Revised: 09/16/2019 Document Reviewed: 10/21/2019 Elsevier Patient Education  [place].

## 2020-11-09 NOTE — Telephone Encounter (Signed)
Patient request that medication be sent through mail order because she does not have a car.

## 2020-11-12 ENCOUNTER — Encounter (HOSPITAL_COMMUNITY): Payer: Self-pay

## 2020-11-12 ENCOUNTER — Encounter (HOSPITAL_COMMUNITY)
Admission: RE | Admit: 2020-11-12 | Discharge: 2020-11-12 | Disposition: A | Discharge status: Home / Self Care | Payer: Medicare Other | Source: Ambulatory Visit | Attending: Urology | Admitting: Urology

## 2020-11-12 ENCOUNTER — Other Ambulatory Visit: Payer: Self-pay

## 2020-11-12 ENCOUNTER — Telehealth: Payer: Self-pay

## 2020-11-12 VITALS — BP 156/78 | HR 76 | Temp 97.7°F | Resp 18 | Ht 64.05 in | Wt 183.1 lb

## 2020-11-12 DIAGNOSIS — Z01812 Encounter for preprocedural laboratory examination: Secondary | ICD-10-CM | POA: Insufficient documentation

## 2020-11-12 DIAGNOSIS — E119 Type 2 diabetes mellitus without complications: Secondary | ICD-10-CM | POA: Diagnosis not present

## 2020-11-12 LAB — BASIC METABOLIC PANEL
Anion gap: 7 (ref 5–15)
BUN: 14 mg/dL (ref 8–23)
CO2: 26 mmol/L (ref 22–32)
Calcium: 9.2 mg/dL (ref 8.9–10.3)
Chloride: 104 mmol/L (ref 98–111)
Creatinine, Ser: 0.79 mg/dL (ref 0.44–1.00)
GFR, Estimated: >60 mL/min (ref >60)
Glucose, Bld: 197 mg/dL — ABNORMAL HIGH (ref 70–99)
Potassium: 4.4 mmol/L (ref 3.5–5.1)
Sodium: 137 mmol/L (ref 135–145)

## 2020-11-12 NOTE — Telephone Encounter (Signed)
-----   Message from Cleon Gustin, MD sent at 11/12/2020 10:51 AM EDT ----- Patient should continue macrobid ----- Message ----- From: Iris Pert, LPN Sent: 8/44/1712   4:31 PM EDT To: Cleon Gustin, MD  Patient started on [facility] 11/09/20

## 2020-11-12 NOTE — Telephone Encounter (Signed)
Patient made aware.

## 2020-11-13 ENCOUNTER — Other Ambulatory Visit: Payer: Self-pay | Admitting: Urology

## 2020-11-13 ENCOUNTER — Encounter: Payer: Self-pay | Admitting: Urology

## 2020-11-13 LAB — HEMOGLOBIN A1C
Hgb A1c MFr Bld: 8.3 % — ABNORMAL HIGH (ref 4.8–5.6)
Mean Plasma Glucose: 192 mg/dL

## 2020-11-15 ENCOUNTER — Ambulatory Visit (HOSPITAL_COMMUNITY): Payer: Medicare Other | Admitting: Certified Registered Nurse Anesthetist

## 2020-11-15 ENCOUNTER — Encounter (HOSPITAL_COMMUNITY)
Admission: RE | Disposition: A | Discharge status: Home / Self Care | Payer: Self-pay | Source: Home / Self Care | Attending: Urology

## 2020-11-15 ENCOUNTER — Ambulatory Visit (HOSPITAL_COMMUNITY)
Admission: RE | Admit: 2020-11-15 | Discharge: 2020-11-15 | Disposition: A | Discharge status: Home / Self Care | Payer: Medicare Other | Attending: Urology | Admitting: Urology

## 2020-11-15 ENCOUNTER — Encounter (HOSPITAL_COMMUNITY): Payer: Self-pay | Admitting: Urology

## 2020-11-15 DIAGNOSIS — N3592 Unspecified urethral stricture, female: Secondary | ICD-10-CM | POA: Diagnosis not present

## 2020-11-15 DIAGNOSIS — G4730 Sleep apnea, unspecified: Secondary | ICD-10-CM | POA: Diagnosis not present

## 2020-11-15 HISTORY — PX: CYSTOSCOPY WITH URETHRAL DILATATION: SHX5125

## 2020-11-15 LAB — GLUCOSE, CAPILLARY: Glucose-Capillary: 158 mg/dL — ABNORMAL HIGH (ref 70–99)

## 2020-11-15 SURGERY — CYSTOSCOPY, WITH URETHRAL DILATION
Anesthesia: General | Site: Urethra

## 2020-11-15 MED ORDER — FENTANYL CITRATE (PF) 250 MCG/5ML IJ SOLN
INTRAMUSCULAR | Status: DC | PRN
  Administered 2020-11-15 (×2): 25 ug via INTRAVENOUS

## 2020-11-15 MED ORDER — FENTANYL CITRATE (PF) 100 MCG/2ML IJ SOLN
INTRAMUSCULAR | Status: AC
  Filled 2020-11-15: qty 2

## 2020-11-15 MED ORDER — LIDOCAINE HCL (PF) 2 % IJ SOLN
INTRAMUSCULAR | Status: AC
  Filled 2020-11-15: qty 5

## 2020-11-15 MED ORDER — MIDAZOLAM HCL 2 MG/2ML IJ SOLN
INTRAMUSCULAR | Status: DC | PRN
  Administered 2020-11-15: 1 mg via INTRAVENOUS

## 2020-11-15 MED ORDER — PROPOFOL 500 MG/50ML IV EMUL
INTRAVENOUS | Status: DC | PRN
  Administered 2020-11-15: 100 ug/kg/min via INTRAVENOUS

## 2020-11-15 MED ORDER — TRAMADOL HCL 50 MG PO TABS: 50.0000 mg | ORAL_TABLET | Freq: Four times a day (QID) | ORAL | 0 refills | Status: DC | PRN

## 2020-11-15 MED ORDER — PROPOFOL 10 MG/ML IV BOLUS
INTRAVENOUS | Status: AC
  Filled 2020-11-15: qty 20

## 2020-11-15 MED ORDER — FENTANYL CITRATE (PF) 100 MCG/2ML IJ SOLN: 25.0000 ug | INTRAMUSCULAR | Status: DC | PRN

## 2020-11-15 MED ORDER — TAMSULOSIN HCL 0.4 MG PO CAPS: 0.4000 mg | ORAL_CAPSULE | Freq: Every day | ORAL | 11 refills | Status: DC

## 2020-11-15 MED ORDER — CHLORHEXIDINE GLUCONATE 0.12 % MT SOLN
15.0000 mL | Freq: Once | OROMUCOSAL | Status: AC
  Administered 2020-11-15: 15 mL via OROMUCOSAL

## 2020-11-15 MED ORDER — MIDAZOLAM HCL 2 MG/2ML IJ SOLN
INTRAMUSCULAR | Status: AC
  Filled 2020-11-15: qty 2

## 2020-11-15 MED ORDER — PROPOFOL 10 MG/ML IV BOLUS
INTRAVENOUS | Status: DC | PRN
  Administered 2020-11-15: 30 mg via INTRAVENOUS
  Administered 2020-11-15: 50 mg via INTRAVENOUS

## 2020-11-15 MED ORDER — OXYCODONE-ACETAMINOPHEN 5-325 MG PO TABS
ORAL_TABLET | ORAL | Status: AC
  Filled 2020-11-15: qty 1

## 2020-11-15 MED ORDER — PHENAZOPYRIDINE HCL 100 MG PO TABS: 100.0000 mg | ORAL_TABLET | Freq: Three times a day (TID) | ORAL | 0 refills | Status: DC | PRN

## 2020-11-15 MED ORDER — OXYCODONE-ACETAMINOPHEN 5-325 MG PO TABS
1.0000 | ORAL_TABLET | Freq: Once | ORAL | Status: AC
  Administered 2020-11-15: 1 via ORAL

## 2020-11-15 MED ORDER — WATER FOR IRRIGATION, STERILE IR SOLN
Status: DC | PRN
  Administered 2020-11-15: 3000 mL

## 2020-11-15 MED ORDER — ONDANSETRON HCL 4 MG/2ML IJ SOLN
INTRAMUSCULAR | Status: DC | PRN
  Administered 2020-11-15: 4 mg via INTRAVENOUS

## 2020-11-15 MED ORDER — LACTATED RINGERS IV SOLN: INTRAVENOUS | Status: DC

## 2020-11-15 MED ORDER — LIDOCAINE HCL (CARDIAC) PF 100 MG/5ML IV SOSY
PREFILLED_SYRINGE | INTRAVENOUS | Status: DC | PRN
  Administered 2020-11-15: 40 mg via INTRAVENOUS

## 2020-11-15 MED ORDER — ONDANSETRON HCL 4 MG/2ML IJ SOLN
INTRAMUSCULAR | Status: AC
  Filled 2020-11-15: qty 2

## 2020-11-15 MED ORDER — ORAL CARE MOUTH RINSE: 15.0000 mL | Freq: Once | OROMUCOSAL | Status: AC

## 2020-11-15 MED ORDER — ONDANSETRON HCL 4 MG/2ML IJ SOLN: 4.0000 mg | Freq: Once | INTRAMUSCULAR | Status: DC | PRN

## 2020-11-15 MED ORDER — CEFAZOLIN SODIUM-DEXTROSE 2-4 GM/100ML-% IV SOLN
2.0000 g | INTRAVENOUS | Status: AC
  Administered 2020-11-15: 2 g via INTRAVENOUS
  Filled 2020-11-15: qty 100

## 2020-11-15 SURGICAL SUPPLY — 22 items
BAG DRAIN URO TABLE W/ADPT NS (BAG) ×3 IMPLANT
BAG DRN 8 ADPR NS SKTRN CSTL (BAG) ×1
BAG DRN RND TRDRP ANRFLXCHMBR (UROLOGICAL SUPPLIES) ×1
BAG HAMPER (MISCELLANEOUS) ×3 IMPLANT
BAG URINE DRAIN 2000ML AR STRL (UROLOGICAL SUPPLIES) ×3 IMPLANT
BALLN NEPHROSTOMY (BALLOONS) ×3
BALLOON NEPHROSTOMY (BALLOONS) ×1 IMPLANT
CATH FOLEY 2WAY SLVR  5CC 18FR (CATHETERS) ×2
CATH FOLEY 2WAY SLVR 5CC 18FR (CATHETERS) ×1 IMPLANT
CLOTH BEACON ORANGE TIMEOUT ST (SAFETY) ×3 IMPLANT
GLOVE BIO SURGEON STRL SZ8 (GLOVE) ×3 IMPLANT
GLOVE SURG UNDER POLY LF SZ7 (GLOVE) ×6 IMPLANT
GOWN STRL REUS W/TWL LRG LVL3 (GOWN DISPOSABLE) ×3 IMPLANT
GOWN STRL REUS W/TWL XL LVL3 (GOWN DISPOSABLE) ×3 IMPLANT
GUIDEWIRE STR DUAL SENSOR (WIRE) ×3 IMPLANT
KIT TURNOVER CYSTO (KITS) ×3 IMPLANT
MANIFOLD NEPTUNE II (INSTRUMENTS) ×3 IMPLANT
PACK CYSTO (CUSTOM PROCEDURE TRAY) ×3 IMPLANT
PAD ARMBOARD 7.5X6 YLW CONV (MISCELLANEOUS) ×3 IMPLANT
TOWEL NATURAL 4PK STERILE (DISPOSABLE) ×3 IMPLANT
WATER STERILE IRR 3000ML UROMA (IV SOLUTION) ×3 IMPLANT
WATER STERILE IRR 500ML POUR (IV SOLUTION) ×3 IMPLANT

## 2020-11-15 NOTE — Anesthesia Postprocedure Evaluation (Signed)
Anesthesia Post Note  Patient: Jessica Mclean  Procedure(s) Performed: CYSTOSCOPY WITH URETHRAL DILATION (Urethra)  Patient location during evaluation: Phase II Anesthesia Type: General Level of consciousness: awake Pain management: pain level controlled Vital Signs Assessment: post-procedure vital signs reviewed and stable Respiratory status: spontaneous breathing and respiratory function stable Cardiovascular status: blood pressure returned to baseline and stable Postop Assessment: no headache and no apparent nausea or vomiting Anesthetic complications: no Comments: Late entry   No notable events documented.   Last Vitals:  Vitals:   11/15/20 0930 11/15/20 0949  BP: (!) 154/79 (!) 152/88  Pulse: (!) 59 63  Resp: 17 18  Temp:  36.5 C  SpO2: 98% 98%    Last Pain:  Vitals:   11/15/20 0949  TempSrc: Oral  PainSc:                  Louann Sjogren

## 2020-11-15 NOTE — Anesthesia Preprocedure Evaluation (Signed)
Anesthesia Evaluation  Patient identified by MRN, date of birth, ID band Patient awake    Reviewed: Allergy & Precautions, H&P , NPO status , Patient's Chart, lab work & pertinent test results, reviewed documented beta blocker date and time   Airway Mallampati: II  TM Distance: >3 FB Neck ROM: full    Dental no notable dental hx.    Pulmonary sleep apnea ,    Pulmonary exam normal breath sounds clear to auscultation       Cardiovascular Exercise Tolerance: Good hypertension, negative cardio ROS   Rhythm:regular Rate:Normal     Neuro/Psych PSYCHIATRIC DISORDERS TIA Neuromuscular disease    GI/Hepatic negative GI ROS, Neg liver ROS,   Endo/Other  negative endocrine ROSdiabetes  Renal/GU negative Renal ROS  negative genitourinary   Musculoskeletal   Abdominal   Peds  Hematology negative hematology ROS (+)   Anesthesia Other Findings   Reproductive/Obstetrics negative OB ROS                             Anesthesia Physical Anesthesia Plan  ASA: 3  Anesthesia Plan: General   Post-op Pain Management:    Induction:   PONV Risk Score and Plan: Propofol infusion and Ondansetron  Airway Management Planned:   Additional Equipment:   Intra-op Plan:   Post-operative Plan:   Informed Consent: I have reviewed the patients History and Physical, chart, labs and discussed the procedure including the risks, benefits and alternatives for the proposed anesthesia with the patient or authorized representative who has indicated his/her understanding and acceptance.     Dental Advisory Given  Plan Discussed with: CRNA  Anesthesia Plan Comments:         Anesthesia Quick Evaluation

## 2020-11-15 NOTE — Transfer of Care (Signed)
Immediate Anesthesia Transfer of Care Note  Patient: Jessica Mclean  Procedure(s) Performed: CYSTOSCOPY WITH URETHRAL DILATION (Urethra)  Patient Location: PACU  Anesthesia Type:General  Level of Consciousness: awake and alert   Airway & Oxygen Therapy: Patient Spontanous Breathing  Post-op Assessment: Report given to RN and Post -op Vital signs reviewed and stable  Post vital signs: Reviewed and stable  Last Vitals:  Vitals Value Taken Time  BP 127/70   Temp    Pulse 68 11/15/20 0918  Resp 15 11/15/20 0918  SpO2 95 % 11/15/20 0918  Vitals shown include unvalidated device data.  Last Pain:  Vitals:   11/15/20 0755  TempSrc: Oral  PainSc: 8       Patients Stated Pain Goal: 9 (06/77/03 4035)  Complications: No notable events documented.

## 2020-11-15 NOTE — Op Note (Signed)
Preoperative diagnosis: urethral stricture  Postoperative diagnosis: Same  Procedure: 1. Cystoscopy 2. Urethral dilation  Attending: Nicolette Bang  Anesthesia: General  History of blood loss: Minimal  Antibiotics: Ancef  Specimens: none  Drains: none  Findings: dense meatal stricture.  No masses/lesions in the bladder. Numerous tortuous vessel in bladder lumen consistent with prior radiation therapy.  Indications: Patient is a 75 year old female with a history of difficulty with urination and was found to have a urethral stricture. After discussing treatment options the patient wishes to proceed with cystoscopy and urethral dilation.  Procedure in detail: Prior to procedure consent was obtained.  Patient was brought to the operating room and a brief timeout was done to ensure correct patient, correct procedure, correct site.  General anesthesia was administered and patient was placed in dorsal lithotomy position.  Her genitalia was then prepped and draped in usual sterile fashion. Using the female urethral sound dilators the urethra was dilated from 8 french to 24 french. We then passed a 11 French cystoscope into the bladder. We inspected the bladder and no lesions, masses, or calculi were noted. We then drained the bladder and removed the cystoscope and this concluded the procedure which was well tolerated by the patient.  Complications: None  Condition: Stable, extubated, transferred to PACU.  Plan: Patient is to be discharged home.  She will follow up in 1-2 weeks.

## 2020-11-15 NOTE — Interval H&P Note (Signed)
History and Physical Interval Note:  11/15/2020 8:19 AM  Jessica Mclean  has presented today for surgery, with the diagnosis of uretheral stricture.  The various methods of treatment have been discussed with the patient and family. After consideration of risks, benefits and other options for treatment, the patient has consented to  Procedure(s): CYSTOSCOPY WITH URETHRAL DILATATION (N/A) as a surgical intervention.  The patient's history has been reviewed, patient examined, no change in status, stable for surgery.  I have reviewed the patient's chart and labs.  Questions were answered to the patient's satisfaction.     Nicolette Bang

## 2020-11-16 ENCOUNTER — Encounter (HOSPITAL_COMMUNITY): Payer: Self-pay | Admitting: Urology

## 2020-11-20 ENCOUNTER — Ambulatory Visit: Payer: Medicare Other

## 2020-11-20 ENCOUNTER — Other Ambulatory Visit: Payer: Self-pay

## 2020-11-20 ENCOUNTER — Telehealth: Payer: Self-pay

## 2020-11-20 DIAGNOSIS — L309 Dermatitis, unspecified: Secondary | ICD-10-CM

## 2020-11-20 MED ORDER — DESONIDE 0.05 % EX OINT: 1.0000 "application " | TOPICAL_OINTMENT | Freq: Two times a day (BID) | CUTANEOUS | 1 refills | Status: DC

## 2020-11-20 MED ORDER — DESONIDE 0.05 % EX OINT: 1.0000 "application " | TOPICAL_OINTMENT | Freq: Two times a day (BID) | CUTANEOUS | 0 refills | Status: DC

## 2020-11-20 NOTE — Telephone Encounter (Signed)
This pt is calling wants 60 grams --please call

## 2020-11-20 NOTE — Telephone Encounter (Signed)
Desonide sent in.

## 2020-11-20 NOTE — Telephone Encounter (Signed)
Grams changed to 60g and resent.

## 2020-11-20 NOTE — Telephone Encounter (Signed)
Pt needs Desonide called in to the pharmacy   would like prednisone also, but advised will need  an appointment for that

## 2020-11-21 ENCOUNTER — Encounter: Payer: Self-pay | Admitting: Internal Medicine

## 2020-11-27 ENCOUNTER — Other Ambulatory Visit: Payer: Self-pay | Admitting: Internal Medicine

## 2020-11-27 ENCOUNTER — Ambulatory Visit (INDEPENDENT_AMBULATORY_CARE_PROVIDER_SITE_OTHER): Payer: Medicare Other | Admitting: Urology

## 2020-11-27 ENCOUNTER — Other Ambulatory Visit: Payer: Self-pay

## 2020-11-27 ENCOUNTER — Encounter: Payer: Self-pay | Admitting: Urology

## 2020-11-27 VITALS — BP 156/64 | HR 71

## 2020-11-27 DIAGNOSIS — E1169 Type 2 diabetes mellitus with other specified complication: Secondary | ICD-10-CM

## 2020-11-27 DIAGNOSIS — R310 Gross hematuria: Secondary | ICD-10-CM

## 2020-11-27 DIAGNOSIS — N9912 Postprocedural urethral stricture, female: Secondary | ICD-10-CM | POA: Diagnosis not present

## 2020-11-27 DIAGNOSIS — E785 Hyperlipidemia, unspecified: Secondary | ICD-10-CM

## 2020-11-27 LAB — URINALYSIS, ROUTINE W REFLEX MICROSCOPIC
Bilirubin, UA: NEGATIVE
Glucose, UA: NEGATIVE
Leukocytes,UA: NEGATIVE
Nitrite, UA: NEGATIVE
RBC, UA: NEGATIVE
Specific Gravity, UA: >1.03 — ABNORMAL HIGH (ref 1.005–1.030)
Urobilinogen, Ur: 0.2 mg/dL (ref 0.2–1.0)
pH, UA: 5 (ref 5.0–7.5)

## 2020-11-27 LAB — MICROSCOPIC EXAMINATION
Bacteria, UA: NONE SEEN
Epithelial Cells (non renal): NONE SEEN /hpf (ref 0–10)
RBC, Urine: NONE SEEN /hpf (ref 0–2)
Renal Epithel, UA: NONE SEEN /hpf
WBC, UA: NONE SEEN /hpf (ref 0–5)

## 2020-11-27 MED ORDER — TAMSULOSIN HCL 0.4 MG PO CAPS: 0.4000 mg | ORAL_CAPSULE | Freq: Every day | ORAL | 11 refills | Status: DC

## 2020-11-27 NOTE — Progress Notes (Signed)
11/27/2020 1:08 PM   Royanne Warshaw 06-12-1945 [id number]  Referring provider: Lindell Spar, MD [address] [place],  [place] [address]  Weak urinary stream   HPI: Ms Coscia is a 75yo here for followup after urethral dilation for a urethral stricture. She notes a stronger urinary stream since surgery. She has less urinary hesitancy, less straining to urinate and the dysuria resolved. She was also started on flomax after surgery. No other complaints today   PMH: Past Medical History:  Diagnosis Date   Bleeding disorder ([place])    Cervical cancer ([place]) 2010   Diabetes mellitus without complication ([place])    type 2   Diabetes mellitus without complication ([place])    Heart murmur    High cholesterol    HLD (hyperlipidemia)    Hypertension    Mental disorder    depression; suicidal ideation in 2017   Sleep apnea    Vitamin D deficiency     Surgical History: Past Surgical History:  Procedure Laterality Date   BILATERAL OOPHORECTOMY     CESAREAN SECTION     x 4   CHOLECYSTECTOMY     COLOSTOMY     x2   CYSTOSCOPY WITH URETHRAL DILATATION N/A 11/15/2020   Procedure: CYSTOSCOPY WITH URETHRAL DILATION;  Surgeon: Cleon Gustin, MD;  Location: [facility];  Service: Urology;  Laterality: N/A;   SALPINGECTOMY     TONSILLECTOMY     TONSILLECTOMY AND ADENOIDECTOMY      Home Medications:  Allergies as of 11/27/2020       Reactions   Other    Nickel staples        Medication List        Accurate as of November 27, 2020  1:08 PM. If you have any questions, ask your nurse or doctor.          Accu-Chek Aviva Plus test strip Generic drug: glucose blood   Accu-Chek Guide w/Device Kit   Accu-Chek Softclix Lancets lancets SMARTSIG:Topical   amLODipine 10 MG tablet Commonly known as: NORVASC Take 10 mg by mouth daily.   atorvastatin 80 MG tablet Commonly known as: LIPITOR TAKE 1 TABLET BY MOUTH  DAILY   calcium-vitamin D 500-400 MG-UNIT tablet Commonly known  as: OSCAL-500 Take 1 tablet by mouth daily.   desonide 0.05 % ointment Commonly known as: DESOWEN Apply 1 application topically 2 (two) times daily.   diphenhydramine-acetaminophen 25-500 MG Tabs tablet Commonly known as: TYLENOL PM Take 1 tablet by mouth at bedtime.   Excedrin Extra Strength 250-250-65 MG tablet Generic drug: aspirin-acetaminophen-caffeine Take 2 tablets by mouth in the morning.   FreeStyle Libre 2 Reader Kerrin Mo As directed   [employer] 2 Sensor Misc 1 Piece by Does not apply route every 14 (fourteen) days.   HumuLIN R 100 units/mL injection Generic drug: insulin regular INJECT 10 UNITS  SUBCUTANEOUSLY 3 TIMES  DAILY BEFORE MEALS What changed:  medication strength See the new instructions. Changed by: Lindell Spar, MD   Insulin Syringe-Needle U-100 31G X 5/16" 0.3 ML Misc Commonly known as: BD Insulin Syringe U/F USE AS DIRECTED   Januvia 50 MG tablet Generic drug: sitaGLIPtin TAKE 1 TABLET BY MOUTH  DAILY   losartan 50 MG tablet Commonly known as: COZAAR Take 50 mg by mouth daily.   metFORMIN 1000 MG tablet Commonly known as: GLUCOPHAGE TAKE 1 TABLET BY MOUTH  TWICE DAILY WITH MEALS   mirabegron ER 25 MG Tb24 tablet Commonly known as: MYRBETRIQ Take  1 tablet (25 mg total) by mouth daily.   nitrofurantoin (macrocrystal-monohydrate) 100 MG capsule Commonly known as: MACROBID Take 1 capsule (100 mg total) by mouth 2 (two) times daily.   omega-3 acid ethyl esters 1 g capsule Commonly known as: LOVAZA Take 2 g by mouth daily.   phenazopyridine 100 MG tablet Commonly known as: Pyridium Take 1 tablet (100 mg total) by mouth 3 (three) times daily as needed for pain.   phenazopyridine 100 MG tablet Commonly known as: Pyridium Take 1 tablet (100 mg total) by mouth 3 (three) times daily as needed for pain.   polyethylene glycol powder 17 GM/SCOOP powder Commonly known as: GLYCOLAX/MIRALAX Take 17 g by mouth daily as needed for mild  constipation or moderate constipation.   PRESERVISION AREDS 2 PO Take 1 capsule by mouth in the morning and at bedtime.   tamsulosin 0.4 MG Caps capsule Commonly known as: Flomax Take 1 capsule (0.4 mg total) by mouth daily after supper.   traMADol 50 MG tablet Commonly known as: Ultram Take 1 tablet (50 mg total) by mouth every 6 (six) hours as needed.        Allergies:  Allergies  Allergen Reactions   Other     Nickel staples    Family History: Family History  Problem Relation Age of Onset   Cancer Father        kidney   Cancer Mother        lung   Other Brother        heart issues    Social History:  reports that she has never smoked. She has never used smokeless tobacco. She reports that she does not drink alcohol and does not use drugs.  ROS: All other review of systems were reviewed and are negative except what is noted above in HPI  Physical Exam: BP (!) 156/64   Pulse 71   LMP  (LMP Unknown)   Constitutional:  Alert and oriented, No acute distress. HEENT: [place] AT, moist mucus membranes.  Trachea midline, no masses. Cardiovascular: No clubbing, cyanosis, or edema. Respiratory: Normal respiratory effort, no increased work of breathing. GI: Abdomen is soft, nontender, nondistended, no abdominal masses GU: No CVA tenderness.  Lymph: No cervical or inguinal lymphadenopathy. Skin: No rashes, bruises or suspicious lesions. Neurologic: Grossly intact, no focal deficits, moving all 4 extremities. Psychiatric: Normal mood and affect.  Laboratory Data: Lab Results  Component Value Date   WBC 5.7 12/11/2019   HGB 11.6 (L) 12/11/2019   HCT 34.0 (L) 12/11/2019   MCV 84.8 12/11/2019   PLT 297 12/11/2019    Lab Results  Component Value Date   CREATININE 0.79 11/12/2020    No results found for: PSA  No results found for: TESTOSTERONE  Lab Results  Component Value Date   HGBA1C 8.3 (H) 11/12/2020    Urinalysis    Component Value Date/Time    COLORURINE YELLOW 11/23/2019 0133   APPEARANCEUR Cloudy (A) 11/07/2020 1408   LABSPEC 1.016 11/23/2019 0133   PHURINE 5.0 11/23/2019 0133   GLUCOSEU Negative 11/07/2020 1408   HGBUR NEGATIVE 11/23/2019 0133   BILIRUBINUR Negative 11/07/2020 1408   KETONESUR negative 03/08/2020 1429   KETONESUR NEGATIVE 11/23/2019 0133   PROTEINUR 3+ (A) 11/07/2020 1408   PROTEINUR 30 (A) 11/23/2019 0133   UROBILINOGEN 0.2 03/08/2020 1429   NITRITE Positive (A) 11/07/2020 1408   NITRITE NEGATIVE 11/23/2019 0133   LEUKOCYTESUR Negative 11/07/2020 1408   LEUKOCYTESUR NEGATIVE 11/23/2019 0133    Lab  Results  Component Value Date   LABMICR See below: 11/07/2020   WBCUA 0-5 11/07/2020   LABEPIT 0-10 11/07/2020   MUCUS Present 11/07/2020   BACTERIA Many (A) 11/07/2020    Pertinent Imaging:  No results found for this or any previous visit.  No results found for this or any previous visit.  No results found for this or any previous visit.  No results found for this or any previous visit.  No results found for this or any previous visit.  No results found for this or any previous visit.  No results found for this or any previous visit.  No results found for this or any previous visit.   Assessment & Plan:    1. Gross hematuria -resolved  2. Postprocedural female urethral stricture -RTC 3 months with Flow PVR   No follow-ups on file.  Nicolette Bang, MD  [facility] [place]

## 2020-11-27 NOTE — Progress Notes (Signed)
Urological Symptom Review  Patient is experiencing the following symptoms: Frequent urination Hard to postpone urination Burning/pain with urination Leakage of urine Blood in urine Injury to kidneys/bladder   Review of Systems  Gastrointestinal (upper)  : Negative for upper GI symptoms  Gastrointestinal (lower) : Negative for lower GI symptoms  Constitutional : Weight loss  Skin: Negative for skin symptoms  Eyes: Blurred vision  Ear/Nose/Throat : Negative for Ear/Nose/Throat symptoms  Hematologic/Lymphatic: Negative for Hematologic/Lymphatic symptoms  Cardiovascular : Negative for cardiovascular symptoms  Respiratory : Cough  Endocrine: Negative for endocrine symptoms  Musculoskeletal: Negative for musculoskeletal symptoms  Neurological: Negative for neurological symptoms  Psychologic: Negative for psychiatric symptoms

## 2020-11-27 NOTE — Patient Instructions (Addendum)

## 2020-12-04 ENCOUNTER — Telehealth: Payer: Self-pay | Admitting: Urology

## 2020-12-04 NOTE — Telephone Encounter (Signed)
Patient calling about UTI-   Asking for refill of her past abx refill. Pt states you discussed about giving her refill on her abx. Please advise.

## 2020-12-04 NOTE — Telephone Encounter (Signed)
Pt called this morning about her prescription for her UTI. She wants the RX sent to [facility].

## 2020-12-06 ENCOUNTER — Ambulatory Visit: Payer: Medicare Other | Admitting: Internal Medicine

## 2020-12-06 ENCOUNTER — Other Ambulatory Visit: Payer: Self-pay

## 2020-12-06 MED ORDER — NITROFURANTOIN MONOHYD MACRO 100 MG PO CAPS: 100.0000 mg | ORAL_CAPSULE | Freq: Two times a day (BID) | ORAL | 0 refills | Status: DC

## 2020-12-06 NOTE — Telephone Encounter (Signed)
Patient notified. [facility]ed [facility] due to transportation issues. Rx sent

## 2020-12-18 ENCOUNTER — Ambulatory Visit: Payer: Medicare Other | Admitting: Internal Medicine

## 2021-01-09 ENCOUNTER — Other Ambulatory Visit: Payer: Self-pay | Admitting: Internal Medicine

## 2021-01-09 ENCOUNTER — Encounter: Payer: Self-pay | Admitting: Pharmacist

## 2021-01-09 DIAGNOSIS — I10 Essential (primary) hypertension: Secondary | ICD-10-CM

## 2021-01-09 DIAGNOSIS — E1169 Type 2 diabetes mellitus with other specified complication: Secondary | ICD-10-CM | POA: Diagnosis not present

## 2021-01-09 DIAGNOSIS — E785 Hyperlipidemia, unspecified: Secondary | ICD-10-CM | POA: Diagnosis not present

## 2021-01-09 DIAGNOSIS — G609 Hereditary and idiopathic neuropathy, unspecified: Secondary | ICD-10-CM

## 2021-01-09 NOTE — Progress Notes (Signed)
Patient educated today on [place] 2 blood glucose monitoring system and was shown how to place. Patient successfully placed first sensor in clinic today. Patient was also given instructions and this was printed out and given to the patient at the end of the visit.  Kennon Holter, PharmD Clinical Pharmacist [facility]

## 2021-01-09 NOTE — Patient Instructions (Signed)
Jessica Mclean,  It was great to talk to you today!  Please call me with any questions or concerns.   Freestyle [employer]  1. For help or to see the videos: https://www.freestyle.abbott/us-en/support.html  2. You should have a sensor, sensor applicator, and a display device (Freestyle [place] Reader or [place]) Each sensor is good for 14 days Smart phone compatibility can be found here: https://www.freestyle.abbott/ie/en/librelink/compatibility-guide.html            3. You will still need your glucose meter If you take more than 500 mg of vitamin C per day. This may cause falsely high blood sugar readings.  Any time your symptoms do not match the reading on your [facility] Any time you don't have a number and an arrow  4. If you have a MRI or CT scan The company recommends you not wear the [facility] as it is unknown if the heat and magnetic fields could damage the components. To get the most out of your session, we advise that you try to schedule your procedure near the end of your sensor session to avoid needing an extra sensor.  5. Apply sensors on the back of your upper arm. Change the site where you place the sensor with each new insertion. If there is adhesive residue left on the skin, you can try applying a household oil like baby oil or coconut oil.  6. Avoid injecting insulin within 3 inches of the sensor.  7. Water. The sensor is water resistant, but the receiver is not. You can still swim, shower, and take a bath. Do NOT take your sensor deeper than 3 feet (1 meter) or immerse it longer than 30 minutes in water. Your receiver needs to be in a dry area and within 20 feet of the transmitter to get readings.    8. If you want to use your smartphone instead of the [employer]. You will need to create a LibreView account.   The app and the receiver do not talk to each other. You will have to  set up alerts in the app as well.   9. Please call [facility] if you have any questions or issues. Customer Service is available at [phone] 7 Days a Week from 8AM to 8PM (excluding holidays).  10. The sensor glucose may not always be the same as your blood glucose. Sensor glucose is a glucose estimate that lags behind the blood glucose.      11. Connect your Zephyr with our clinic so we can monitor your blood sugars even when you are not in clinic. Go to your [employer] app Click "Settings" at top left  Click "Connected Apps" Click "LibreView" Add Clinic ID # to connect: primarycare  Common Concerns  Problems with Freestyle sticking? Order Skin Tac from [facility]. Alcohol swab area you plan to administer Freestyle Libre then let dry. Once dry, apply Skin Tac in a circular motion (with a spot in the middle for sensor without skin tac) and let dry. Once dry you can apply Freestyle [place]!   Problems taking off Freestyle Libre? Remember to try to shower/bathe before removing Freestyle Libre Order Tac Away to help remove any extra adhesive left on your skin once you remove Freestyle Libre  Problems with irritation? Before applying Freestyle Libre: Apply Nasacort nasal spray (can buy over the counter) to area you will apply Freestyle Libre --> alcohol swab --> Skin Tac -->  Freestyle Libre After applying Freestyle Libre: Apply hydrocortisone cream (can buy over the counter) to area until redness resolves

## 2021-01-10 LAB — CMP14+EGFR
ALT: 16 IU/L (ref 0–32)
AST: 18 IU/L (ref 0–40)
Albumin/Globulin Ratio: 1.8 (ref 1.2–2.2)
Albumin: 4.4 g/dL (ref 3.7–4.7)
Alkaline Phosphatase: 92 IU/L (ref 44–121)
BUN/Creatinine Ratio: 13 (ref 12–28)
BUN: 11 mg/dL (ref 8–27)
Bilirubin Total: 0.4 mg/dL (ref 0.0–1.2)
CO2: 23 mmol/L (ref 20–29)
Calcium: 9.7 mg/dL (ref 8.7–10.3)
Chloride: 101 mmol/L (ref 96–106)
Creatinine, Ser: 0.83 mg/dL (ref 0.57–1.00)
Globulin, Total: 2.5 g/dL (ref 1.5–4.5)
Glucose: 180 mg/dL — ABNORMAL HIGH (ref 65–99)
Potassium: 4.2 mmol/L (ref 3.5–5.2)
Sodium: 140 mmol/L (ref 134–144)
Total Protein: 6.9 g/dL (ref 6.0–8.5)
eGFR: 73 mL/min/{1.73_m2} (ref >59)

## 2021-01-10 LAB — HEMOGLOBIN A1C
Est. average glucose Bld gHb Est-mCnc: 217 mg/dL
Hgb A1c MFr Bld: 9.2 % — ABNORMAL HIGH (ref 4.8–5.6)

## 2021-01-10 LAB — CBC WITH DIFFERENTIAL/PLATELET
Basophils Absolute: 0.1 10*3/uL (ref 0.0–0.2)
Basos: 1 %
EOS (ABSOLUTE): 0.1 10*3/uL (ref 0.0–0.4)
Eos: 2 %
Hematocrit: 28.1 % — ABNORMAL LOW (ref 34.0–46.6)
Hemoglobin: 9 g/dL — ABNORMAL LOW (ref 11.1–15.9)
Immature Grans (Abs): 0 10*3/uL (ref 0.0–0.1)
Immature Granulocytes: 1 %
Lymphocytes Absolute: 1.5 10*3/uL (ref 0.7–3.1)
Lymphs: 23 %
MCH: 24.5 pg — ABNORMAL LOW (ref 26.6–33.0)
MCHC: 32 g/dL (ref 31.5–35.7)
MCV: 76 fL — ABNORMAL LOW (ref 79–97)
Monocytes Absolute: 0.6 10*3/uL (ref 0.1–0.9)
Monocytes: 9 %
Neutrophils Absolute: 4.3 10*3/uL (ref 1.4–7.0)
Neutrophils: 64 %
Platelets: 353 10*3/uL (ref 150–450)
RBC: 3.68 x10E6/uL — ABNORMAL LOW (ref 3.77–5.28)
RDW: 16.1 % — ABNORMAL HIGH (ref 11.7–15.4)
WBC: 6.6 10*3/uL (ref 3.4–10.8)

## 2021-01-10 LAB — LIPID PANEL
Chol/HDL Ratio: 6.1 ratio — ABNORMAL HIGH (ref 0.0–4.4)
Cholesterol, Total: 245 mg/dL — ABNORMAL HIGH (ref 100–199)
HDL: 40 mg/dL (ref >39)
LDL Chol Calc (NIH): 150 mg/dL — ABNORMAL HIGH (ref 0–99)
Triglycerides: 296 mg/dL — ABNORMAL HIGH (ref 0–149)
VLDL Cholesterol Cal: 55 mg/dL — ABNORMAL HIGH (ref 5–40)

## 2021-01-10 LAB — TSH: TSH: 1.3 u[IU]/mL (ref 0.450–4.500)

## 2021-01-15 ENCOUNTER — Encounter: Payer: Self-pay | Admitting: Internal Medicine

## 2021-01-15 ENCOUNTER — Other Ambulatory Visit: Payer: Self-pay

## 2021-01-15 ENCOUNTER — Ambulatory Visit (INDEPENDENT_AMBULATORY_CARE_PROVIDER_SITE_OTHER): Payer: Medicare Other | Admitting: Internal Medicine

## 2021-01-15 VITALS — BP 180/88 | HR 79 | Resp 18 | Ht 64.5 in | Wt 160.1 lb

## 2021-01-15 DIAGNOSIS — Z23 Encounter for immunization: Secondary | ICD-10-CM

## 2021-01-15 DIAGNOSIS — D509 Iron deficiency anemia, unspecified: Secondary | ICD-10-CM | POA: Diagnosis not present

## 2021-01-15 DIAGNOSIS — N3941 Urge incontinence: Secondary | ICD-10-CM | POA: Diagnosis not present

## 2021-01-15 DIAGNOSIS — I10 Essential (primary) hypertension: Secondary | ICD-10-CM | POA: Diagnosis not present

## 2021-01-15 DIAGNOSIS — E785 Hyperlipidemia, unspecified: Secondary | ICD-10-CM

## 2021-01-15 DIAGNOSIS — E1169 Type 2 diabetes mellitus with other specified complication: Secondary | ICD-10-CM | POA: Diagnosis not present

## 2021-01-15 MED ORDER — LANTUS SOLOSTAR 100 UNIT/ML ~~LOC~~ SOPN: 20.0000 [IU] | PEN_INJECTOR | Freq: Every day | SUBCUTANEOUS | 1 refills | Status: DC

## 2021-01-15 MED ORDER — TELMISARTAN 40 MG PO TABS: 40.0000 mg | ORAL_TABLET | Freq: Every day | ORAL | 0 refills | Status: DC

## 2021-01-15 MED ORDER — OZEMPIC (0.25 OR 0.5 MG/DOSE) 2 MG/1.5ML ~~LOC~~ SOPN: 0.5000 mg | PEN_INJECTOR | SUBCUTANEOUS | 0 refills | Status: DC

## 2021-01-15 MED ORDER — AMLODIPINE BESYLATE 10 MG PO TABS: 10.0000 mg | ORAL_TABLET | Freq: Every day | ORAL | 3 refills | Status: DC

## 2021-01-15 NOTE — Patient Instructions (Addendum)
Please start taking Lantus 20 U at nighttime and take short-acting insulin with meals according to the sliding scale.  Please start taking Ozempic and stop taking Januvia.  Please star taking Amlodipine and Telmisartan as prescribed for high BP.  Continue taking other medications as prescribed.

## 2021-01-15 NOTE — Progress Notes (Signed)
Established Patient Office Visit  Subjective:  Patient ID: Jessica Mclean, female    DOB: [birth year]  Age: 75 y.o. MRN: [id number]  CC:  Chief Complaint  Patient presents with   Follow-up    4 week follow up dexome is working great     HPI Jessica Mclean is a 75 year old female with PMH of HTN, DM2 on insulin, TIA, HLD and cervical ca. s/p radiation who presents for follow up for her chronic medical conditions.  HTN: BP is still significantly elevated. She has not been taking her medications. She denies any headache, dizziness, chest pain, dyspnea or palpitations.  DM: Her HbA1C was 9.2 recently. She has been taking Novolin per sliding scale. She recently set up her Dexcom in our office and feels that she will take medications regularly now since she can check her glucose regularly. She is willing to start long-acting insulin as well. Denies any polyuria or polyphagia.  She recently had cystoscopy with urethral dilation for urethral stricture. Her incontinence symptoms have improved now with Myrbetriq.   Past Medical History:  Diagnosis Date   Bleeding disorder ([place])    Cervical cancer ([place]) 2010   Diabetes mellitus without complication ([place])    type 2   Diabetes mellitus without complication ([place])    Heart murmur    High cholesterol    HLD (hyperlipidemia)    Hypertension    Mental disorder    depression; suicidal ideation in 2017   Sleep apnea    Vitamin D deficiency     Past Surgical History:  Procedure Laterality Date   BILATERAL OOPHORECTOMY     CESAREAN SECTION     x 4   CHOLECYSTECTOMY     COLOSTOMY     x2   CYSTOSCOPY WITH URETHRAL DILATATION N/A 11/15/2020   Procedure: CYSTOSCOPY WITH URETHRAL DILATION;  Surgeon: Cleon Gustin, MD;  Location: [facility];  Service: Urology;  Laterality: N/A;   SALPINGECTOMY     TONSILLECTOMY     TONSILLECTOMY AND ADENOIDECTOMY      Family History  Problem Relation Age of Onset   Cancer Father        kidney    Cancer Mother        lung   Other Brother        heart issues    Social History   Socioeconomic History   Marital status: Divorced    Spouse name: Not on file   Number of children: 3   Years of education: Not on file   Highest education level: Not on file  Occupational History   Occupation: retired  Tobacco Use   Smoking status: Never   Smokeless tobacco: Never  Vaping Use   Vaping Use: Never used  Substance and Sexual Activity   Alcohol use: Never   Drug use: Never   Sexual activity: Not Currently    Birth control/protection: Post-menopausal  Other Topics Concern   Not on file  Social History Narrative   ** Merged History Encounter **       Social Determinants of Health   Financial Resource Strain: Medium Risk   Difficulty of Paying Living Expenses: Somewhat hard  Food Insecurity: Landscape architect Present   Worried About Charity fundraiser in the Last Year: Sometimes true   [place] in the Last Year: Sometimes true  Transportation Needs: No Transportation Needs   Lack of Transportation (Medical): No   Lack of Transportation (Non-Medical): No  Physical Activity: Insufficiently Active  Days of Exercise per Week: 4 days   Minutes of Exercise per Session: 30 min  Stress: No Stress Concern Present   Feeling of Stress : Only a little  Social Connections: Socially Isolated   Frequency of Communication with Friends and Family: More than three times a week   Frequency of Social Gatherings with Friends and Family: Twice a week   Attends Religious Services: Never   Marine scientist or Organizations: No   Attends Archivist Meetings: Never   Marital Status: Widowed  Intimate Partner Violence: Not on file    Outpatient Medications Prior to Visit  Medication Sig Dispense Refill   ACCU-CHEK AVIVA PLUS test strip      Accu-Chek Softclix Lancets lancets SMARTSIG:Topical     aspirin-acetaminophen-caffeine (EXCEDRIN EXTRA STRENGTH) 250-250-65 MG  tablet Take 2 tablets by mouth in the morning.     atorvastatin (LIPITOR) 80 MG tablet TAKE 1 TABLET BY MOUTH  DAILY (Patient taking differently: Take 80 mg by mouth daily.) 60 tablet 5   Blood Glucose Monitoring Suppl (ACCU-CHEK GUIDE) w/Device KIT      calcium-vitamin D (OSCAL-500) 500-400 MG-UNIT tablet Take 1 tablet by mouth daily.     Continuous Blood Gluc Receiver (FREESTYLE LIBRE 2 READER) DEVI As directed 1 each 0   Continuous Blood Gluc Sensor (FREESTYLE LIBRE 2 SENSOR) MISC 1 Piece by Does not apply route every 14 (fourteen) days. 2 each 3   desonide (DESOWEN) 0.05 % ointment Apply 1 application topically 2 (two) times daily. 60 g 1   diphenhydramine-acetaminophen (TYLENOL PM) 25-500 MG TABS tablet Take 1 tablet by mouth at bedtime.     HUMULIN R 100 UNIT/ML injection INJECT 10 UNITS  SUBCUTANEOUSLY 3 TIMES  DAILY BEFORE MEALS 30 mL 3   Insulin Syringe-Needle U-100 (BD INSULIN SYRINGE U/F) 31G X 5/16" 0.3 ML MISC USE AS DIRECTED 100 each 0   metFORMIN (GLUCOPHAGE) 1000 MG tablet TAKE 1 TABLET BY MOUTH  TWICE DAILY WITH MEALS 180 tablet 3   mirabegron ER (MYRBETRIQ) 25 MG TB24 tablet Take 1 tablet (25 mg total) by mouth daily. 30 tablet 11   Multiple Vitamins-Minerals (PRESERVISION AREDS 2 PO) Take 1 capsule by mouth in the morning and at bedtime.     omega-3 acid ethyl esters (LOVAZA) 1 g capsule Take 2 g by mouth daily.     phenazopyridine (PYRIDIUM) 100 MG tablet Take 1 tablet (100 mg total) by mouth 3 (three) times daily as needed for pain. 15 tablet 0   polyethylene glycol powder (GLYCOLAX/MIRALAX) 17 GM/SCOOP powder Take 17 g by mouth daily as needed for mild constipation or moderate constipation.      tamsulosin (FLOMAX) 0.4 MG CAPS capsule Take 1 capsule (0.4 mg total) by mouth daily after supper. 30 capsule 11   traMADol (ULTRAM) 50 MG tablet Take 1 tablet (50 mg total) by mouth every 6 (six) hours as needed. 15 tablet 0   amLODipine (NORVASC) 10 MG tablet Take 10 mg by mouth  daily.     JANUVIA 50 MG tablet TAKE 1 TABLET BY MOUTH  DAILY 90 tablet 3   losartan (COZAAR) 50 MG tablet Take 50 mg by mouth daily.     nitrofurantoin, macrocrystal-monohydrate, (MACROBID) 100 MG capsule Take 1 capsule (100 mg total) by mouth 2 (two) times daily. 14 capsule 0   phenazopyridine (PYRIDIUM) 100 MG tablet Take 1 tablet (100 mg total) by mouth 3 (three) times daily as needed for pain. 15 tablet 0  No facility-administered medications prior to visit.    Allergies  Allergen Reactions   Other     Nickel staples    ROS Review of Systems  Constitutional:  Negative for chills and fever.  HENT:  Negative for congestion, sinus pressure, sinus pain and sore throat.   Eyes:  Negative for pain and discharge.  Respiratory:  Negative for cough and shortness of breath.   Cardiovascular:  Negative for chest pain and palpitations.  Gastrointestinal:  Negative for abdominal pain, constipation, diarrhea, nausea and vomiting.  Endocrine: Negative for polydipsia and polyuria.  Genitourinary:  Positive for urgency. Negative for dysuria, flank pain, frequency and hematuria.  Musculoskeletal:  Negative for neck pain and neck stiffness.  Skin:  Negative for rash.  Neurological:  Negative for dizziness, syncope, speech difficulty, weakness and numbness.  Psychiatric/Behavioral:  Negative for agitation and behavioral problems.      Objective:    Physical Exam Vitals reviewed.  Constitutional:      General: She is not in acute distress.    Appearance: She is not diaphoretic.  HENT:     Head: Normocephalic and atraumatic.     Nose: Nose normal.     Mouth/Throat:     Mouth: Mucous membranes are moist.  Eyes:     General: No scleral icterus.    Extraocular Movements: Extraocular movements intact.  Cardiovascular:     Rate and Rhythm: Normal rate and regular rhythm.     Pulses: Normal pulses.     Heart sounds: Normal heart sounds. No murmur heard.   No friction rub. No gallop.   Pulmonary:     Breath sounds: Normal breath sounds. No wheezing or rales.  Abdominal:     Palpations: Abdomen is soft.     Tenderness: There is no abdominal tenderness.  Musculoskeletal:     Cervical back: Neck supple. No tenderness.     Right lower leg: No edema.     Left lower leg: No edema.  Skin:    General: Skin is warm.     Findings: No bruising.  Neurological:     General: No focal deficit present.     Mental Status: She is alert and oriented to person, place, and time.     Sensory: No sensory deficit.     Motor: No weakness.  Psychiatric:        Mood and Affect: Mood normal.        Behavior: Behavior normal.    BP (!) 180/88 (BP Location: Left Arm, Cuff Size: Normal)   Pulse 79   Resp 18   Ht 5' 4.5" (1.638 m)   Wt 160 lb 1.9 oz (72.6 kg)   LMP  (LMP Unknown)   SpO2 97%   BMI 27.06 kg/m  Wt Readings from Last 3 Encounters:  01/15/21 160 lb 1.9 oz (72.6 kg)  11/12/20 183 lb 1.9 oz (83.1 kg)  11/08/20 183 lb 1.9 oz (83.1 kg)     Health Maintenance Due  Topic Date Due   Hepatitis C Screening  Never done   TETANUS/TDAP  Never done   COVID-19 Vaccine (4 - Booster for Moderna series) 07/06/2020    There are no preventive care reminders to display for this patient.  Lab Results  Component Value Date   TSH 1.300 01/09/2021   Lab Results  Component Value Date   WBC 6.6 01/09/2021   HGB 9.0 (L) 01/09/2021   HCT 28.1 (L) 01/09/2021   MCV 76 (L) 01/09/2021   PLT  353 01/09/2021   Lab Results  Component Value Date   NA 140 01/09/2021   K 4.2 01/09/2021   CO2 23 01/09/2021   GLUCOSE 180 (H) 01/09/2021   BUN 11 01/09/2021   CREATININE 0.83 01/09/2021   BILITOT 0.4 01/09/2021   ALKPHOS 92 01/09/2021   AST 18 01/09/2021   ALT 16 01/09/2021   PROT 6.9 01/09/2021   ALBUMIN 4.4 01/09/2021   CALCIUM 9.7 01/09/2021   ANIONGAP 7 11/12/2020   EGFR 73 01/09/2021   Lab Results  Component Value Date   CHOL 245 (H) 01/09/2021   Lab Results  Component  Value Date   HDL 40 01/09/2021   Lab Results  Component Value Date   LDLCALC 150 (H) 01/09/2021   Lab Results  Component Value Date   TRIG 296 (H) 01/09/2021   Lab Results  Component Value Date   CHOLHDL 6.1 (H) 01/09/2021   Lab Results  Component Value Date   HGBA1C 9.2 (H) 01/09/2021      Assessment & Plan:   Problem List Items Addressed This Visit       Cardiovascular and Mediastinum   Essential hypertension - Primary    BP Readings from Last 1 Encounters:  01/15/21 (!) 180/88  Uncontrolled due to noncompliance Losartan 50 mg QD and Amlodipine 10 mg QD, refills sent Counseled for compliance with the medications Advised DASH diet and moderate exercise/walking, at least 150 mins/week Advised to check BP at home and bring the log in next visit      Relevant Medications   amLODipine (NORVASC) 10 MG tablet   telmisartan (MICARDIS) 40 MG tablet     Endocrine   Type 2 diabetes mellitus with hyperlipidemia ([facility])    Lab Results  Component Value Date   HGBA1C 9.2 (H) 01/09/2021  On Novolin, Metformin and Januvia Takes Novolin according to ISS Has been noncompliant, but since she has Dexcom now, she states that she will take her medications regularly. Started Lantus 20 U qHS Started Ozempic and discontinue Januvia Advised to follow diabetic diet On ARB and statin F/u CMP and lipid panel Diabetic eye exam: Advised to follow up with Ophthalmology for diabetic eye exam  Will benefit from continuous glucose monitoring.      Relevant Medications   Semaglutide,0.25 or 0.5MG/DOS, (OZEMPIC, 0.25 OR 0.5 MG/DOSE,) 2 MG/1.5ML SOPN   insulin glargine (LANTUS SOLOSTAR) 100 UNIT/ML Solostar Pen   amLODipine (NORVASC) 10 MG tablet   telmisartan (MICARDIS) 40 MG tablet     Other   Urge incontinence    On Myrbetric Had cystoscopy with urethral dilation for stricture recently      Microcytic anemia    CBC reviewed No signs of active bleeding Needs to take iron  supplements for now Will check CBC and iron profile in the next visit.      Need for immunization against influenza    Patient was educated on the recommendation for flu vaccine. After obtaining informed consent, the vaccine was administered no adverse effects noted at time of administration. Patient provided with education on arm soreness and use of tylenol or ibuprofen (if safe) for this. Encourage to use the arm vaccine was given in to help reduce the soreness. Patient educated on the signs of a reaction to the vaccine and advised to contact the office should these occur.      Relevant Orders   Flu Vaccine QUAD High Dose(Fluad) (Completed)    Meds ordered this encounter  Medications   Semaglutide,0.25 or  0.5MG/DOS, (OZEMPIC, 0.25 OR 0.5 MG/DOSE,) 2 MG/1.5ML SOPN    Sig: Inject 0.5 mg into the skin every 7 (seven) days.    Dispense:  3 mL    Refill:  0   insulin glargine (LANTUS SOLOSTAR) 100 UNIT/ML Solostar Pen    Sig: Inject 20 Units into the skin at bedtime.    Dispense:  15 mL    Refill:  1   amLODipine (NORVASC) 10 MG tablet    Sig: Take 1 tablet (10 mg total) by mouth daily.    Dispense:  90 tablet    Refill:  3   telmisartan (MICARDIS) 40 MG tablet    Sig: Take 1 tablet (40 mg total) by mouth daily.    Dispense:  90 tablet    Refill:  0    Follow-up: Return in about 6 weeks (around 02/26/2021) for HTN.    Lindell Spar, MD

## 2021-01-16 NOTE — Assessment & Plan Note (Signed)
On Myrbetric Had cystoscopy with urethral dilation for stricture recently

## 2021-01-16 NOTE — Assessment & Plan Note (Signed)

## 2021-01-16 NOTE — Assessment & Plan Note (Addendum)
CBC reviewed No signs of active bleeding Needs to take iron supplements for now Will check CBC and iron profile in the next visit.

## 2021-01-16 NOTE — Assessment & Plan Note (Signed)
Lab Results  Component Value Date   HGBA1C 9.2 (H) 01/09/2021   On Novolin, Metformin and Januvia Takes Novolin according to ISS Has been noncompliant, but since she has Dexcom now, she states that she will take her medications regularly. Started Lantus 20 U qHS Started Ozempic and discontinue Januvia Advised to follow diabetic diet On ARB and statin F/u CMP and lipid panel Diabetic eye exam: Advised to follow up with Ophthalmology for diabetic eye exam  Will benefit from continuous glucose monitoring.

## 2021-01-16 NOTE — Assessment & Plan Note (Signed)
BP Readings from Last 1 Encounters:  01/15/21 (!) 180/88   Uncontrolled due to noncompliance Losartan 50 mg QD and Amlodipine 10 mg QD, refills sent Counseled for compliance with the medications Advised DASH diet and moderate exercise/walking, at least 150 mins/week Advised to check BP at home and bring the log in next visit

## 2021-02-20 DIAGNOSIS — E1165 Type 2 diabetes mellitus with hyperglycemia: Secondary | ICD-10-CM | POA: Diagnosis not present

## 2021-02-21 ENCOUNTER — Telehealth: Payer: Self-pay

## 2021-02-21 NOTE — Telephone Encounter (Signed)
Patient called would like the new Libre III instead of the [place] II sent into [employer] [employer] # [phone].

## 2021-02-21 NOTE — Telephone Encounter (Signed)
FYI signature is needed form is in your box please advise

## 2021-02-23 ENCOUNTER — Other Ambulatory Visit: Payer: Self-pay | Admitting: Internal Medicine

## 2021-02-23 DIAGNOSIS — E1169 Type 2 diabetes mellitus with other specified complication: Secondary | ICD-10-CM

## 2021-02-23 DIAGNOSIS — E785 Hyperlipidemia, unspecified: Secondary | ICD-10-CM

## 2021-02-26 ENCOUNTER — Other Ambulatory Visit: Payer: Self-pay

## 2021-02-26 ENCOUNTER — Ambulatory Visit: Payer: Medicare Other | Admitting: Urology

## 2021-02-26 ENCOUNTER — Ambulatory Visit (INDEPENDENT_AMBULATORY_CARE_PROVIDER_SITE_OTHER): Payer: Medicare Other | Admitting: Internal Medicine

## 2021-02-26 ENCOUNTER — Encounter: Payer: Self-pay | Admitting: Internal Medicine

## 2021-02-26 VITALS — BP 149/62 | HR 80 | Temp 98.9°F | Ht 64.5 in | Wt 159.4 lb

## 2021-02-26 DIAGNOSIS — E1169 Type 2 diabetes mellitus with other specified complication: Secondary | ICD-10-CM

## 2021-02-26 DIAGNOSIS — I10 Essential (primary) hypertension: Secondary | ICD-10-CM

## 2021-02-26 DIAGNOSIS — E785 Hyperlipidemia, unspecified: Secondary | ICD-10-CM

## 2021-02-26 NOTE — Assessment & Plan Note (Signed)
BP Readings from Last 1 Encounters:  02/26/21 (!) 149/62   Better controlled with telmisartan 40 mg daily and amlodipine 10 mg daily now Counseled for compliance with the medications Advised DASH diet and moderate exercise/walking, at least 150 mins/week

## 2021-02-26 NOTE — Progress Notes (Signed)
Acute Office Visit  Subjective:    Patient ID: Jessica Mclean, female    DOB: Sep 16, 1945, 75 y.o.   MRN: [id number]  Chief Complaint  Patient presents with   Follow-up    6 week follow up HTN    HPI Patient is in today for f/u HTN and DM.  HTN: Her BP was elevated in the office today, but has been better compared to prior.  She has been taking amlodipine and telmisartan regularly.  She denies any headache, dizziness, chest pain, dyspnea or palpitations.  DM: She has not started taking Ozempic yet.  She has been taking metformin and Humalog ISS only.  She also has not been taking Lantus.  She has Dexcom now, and her blood glucose has been between 200-300 most of the time.  She states that she was not aware of her regimen and had lost her AVS from last visit.  I had lengthy discussion with her regarding her diabetes regimen and importance of compliance to it.  Past Medical History:  Diagnosis Date   Bleeding disorder ([place])    Cervical cancer ([place]) 2010   Diabetes mellitus without complication ([place])    type 2   Diabetes mellitus without complication ([place])    Heart murmur    High cholesterol    HLD (hyperlipidemia)    Hypertension    Mental disorder    depression; suicidal ideation in 2017   Sleep apnea    Vitamin D deficiency     Past Surgical History:  Procedure Laterality Date   BILATERAL OOPHORECTOMY     CESAREAN SECTION     x 4   CHOLECYSTECTOMY     COLOSTOMY     x2   CYSTOSCOPY WITH URETHRAL DILATATION N/A 11/15/2020   Procedure: CYSTOSCOPY WITH URETHRAL DILATION;  Surgeon: Cleon Gustin, MD;  Location: [facility];  Service: Urology;  Laterality: N/A;   SALPINGECTOMY     TONSILLECTOMY     TONSILLECTOMY AND ADENOIDECTOMY      Family History  Problem Relation Age of Onset   Cancer Father        kidney   Cancer Mother        lung   Other Brother        heart issues    Social History   Socioeconomic History   Marital status: Divorced    Spouse name:  Not on file   Number of children: 3   Years of education: Not on file   Highest education level: Not on file  Occupational History   Occupation: retired  Tobacco Use   Smoking status: Never   Smokeless tobacco: Never  Vaping Use   Vaping Use: Never used  Substance and Sexual Activity   Alcohol use: Never   Drug use: Never   Sexual activity: Not Currently    Birth control/protection: Post-menopausal  Other Topics Concern   Not on file  Social History Narrative   ** Merged History Encounter **       Social Determinants of Health   Financial Resource Strain: Medium Risk   Difficulty of Paying Living Expenses: Somewhat hard  Food Insecurity: Landscape architect Present   Worried About Charity fundraiser in the Last Year: Sometimes true   [place] in the Last Year: Sometimes true  Transportation Needs: No Transportation Needs   Lack of Transportation (Medical): No   Lack of Transportation (Non-Medical): No  Physical Activity: Insufficiently Active   Days of Exercise per Week:  4 days   Minutes of Exercise per Session: 30 min  Stress: No Stress Concern Present   Feeling of Stress : Only a little  Social Connections: Socially Isolated   Frequency of Communication with Friends and Family: More than three times a week   Frequency of Social Gatherings with Friends and Family: Twice a week   Attends Religious Services: Never   Marine scientist or Organizations: No   Attends Archivist Meetings: Never   Marital Status: Widowed  Intimate Partner Violence: Not on file    Outpatient Medications Prior to Visit  Medication Sig Dispense Refill   ACCU-CHEK AVIVA PLUS test strip      Accu-Chek Softclix Lancets lancets SMARTSIG:Topical     amLODipine (NORVASC) 10 MG tablet Take 1 tablet (10 mg total) by mouth daily. 90 tablet 3   aspirin-acetaminophen-caffeine (EXCEDRIN EXTRA STRENGTH) 250-250-65 MG tablet Take 2 tablets by mouth in the morning.     atorvastatin  (LIPITOR) 80 MG tablet TAKE 1 TABLET BY MOUTH  DAILY (Patient taking differently: Take 80 mg by mouth daily.) 60 tablet 5   Blood Glucose Monitoring Suppl (ACCU-CHEK GUIDE) w/Device KIT      calcium-vitamin D (OSCAL-500) 500-400 MG-UNIT tablet Take 1 tablet by mouth daily.     Continuous Blood Gluc Receiver (FREESTYLE LIBRE 2 READER) DEVI As directed 1 each 0   Continuous Blood Gluc Sensor (FREESTYLE LIBRE 2 SENSOR) MISC 1 Piece by Does not apply route every 14 (fourteen) days. 2 each 3   HUMULIN R 100 UNIT/ML injection INJECT 10 UNITS  SUBCUTANEOUSLY 3 TIMES  DAILY BEFORE MEALS 30 mL 3   insulin glargine (LANTUS SOLOSTAR) 100 UNIT/ML Solostar Pen Inject 20 Units into the skin at bedtime. 15 mL 1   Insulin Syringe-Needle U-100 (BD INSULIN SYRINGE U/F) 31G X 5/16" 0.3 ML MISC USE AS DIRECTED 100 each 0   metFORMIN (GLUCOPHAGE) 1000 MG tablet TAKE 1 TABLET BY MOUTH  TWICE DAILY WITH MEALS 180 tablet 3   omega-3 acid ethyl esters (LOVAZA) 1 g capsule Take 2 g by mouth daily.     phenazopyridine (PYRIDIUM) 100 MG tablet Take 1 tablet (100 mg total) by mouth 3 (three) times daily as needed for pain. 15 tablet 0   polyethylene glycol powder (GLYCOLAX/MIRALAX) 17 GM/SCOOP powder Take 17 g by mouth daily as needed for mild constipation or moderate constipation.      desonide (DESOWEN) 0.05 % ointment Apply 1 application topically 2 (two) times daily. (Patient not taking: Reported on 02/26/2021) 60 g 1   diphenhydramine-acetaminophen (TYLENOL PM) 25-500 MG TABS tablet Take 1 tablet by mouth at bedtime. (Patient not taking: Reported on 02/26/2021)     OZEMPIC, 0.25 OR 0.5 MG/DOSE, 2 MG/1.5ML SOPN INJECT SUBCUTANEOUSLY 0.5  MG EVERY WEEK (Patient not taking: Reported on 02/26/2021) 3 mL 5   tamsulosin (FLOMAX) 0.4 MG CAPS capsule Take 1 capsule (0.4 mg total) by mouth daily after supper. (Patient not taking: Reported on 02/26/2021) 30 capsule 11   telmisartan (MICARDIS) 40 MG tablet Take 1 tablet (40 mg total)  by mouth daily. 90 tablet 0   traMADol (ULTRAM) 50 MG tablet Take 1 tablet (50 mg total) by mouth every 6 (six) hours as needed. (Patient not taking: Reported on 02/26/2021) 15 tablet 0   mirabegron ER (MYRBETRIQ) 25 MG TB24 tablet Take 1 tablet (25 mg total) by mouth daily. 30 tablet 11   Multiple Vitamins-Minerals (PRESERVISION AREDS 2 PO) Take 1 capsule by mouth  in the morning and at bedtime.     No facility-administered medications prior to visit.    Allergies  Allergen Reactions   Other     Nickel staples    Review of Systems  Constitutional:  Negative for chills and fever.  HENT:  Negative for congestion, sinus pressure, sinus pain and sore throat.   Eyes:  Negative for pain and discharge.  Respiratory:  Negative for cough and shortness of breath.   Cardiovascular:  Negative for chest pain and palpitations.  Gastrointestinal:  Negative for abdominal pain, constipation, diarrhea, nausea and vomiting.  Endocrine: Negative for polydipsia and polyuria.  Genitourinary:  Positive for urgency. Negative for dysuria, flank pain, frequency and hematuria.  Musculoskeletal:  Negative for neck pain and neck stiffness.  Skin:  Negative for rash.  Neurological:  Negative for dizziness, syncope, speech difficulty, weakness and numbness.  Psychiatric/Behavioral:  Negative for agitation and behavioral problems.       Objective:    Physical Exam Vitals reviewed.  Constitutional:      General: She is not in acute distress.    Appearance: She is not diaphoretic.  HENT:     Head: Normocephalic and atraumatic.     Nose: Nose normal.     Mouth/Throat:     Mouth: Mucous membranes are moist.  Eyes:     General: No scleral icterus.    Extraocular Movements: Extraocular movements intact.  Cardiovascular:     Rate and Rhythm: Normal rate and regular rhythm.     Pulses: Normal pulses.     Heart sounds: Normal heart sounds. No murmur heard. Pulmonary:     Breath sounds: Normal breath sounds.  No wheezing or rales.  Musculoskeletal:     Cervical back: Neck supple. No tenderness.     Right lower leg: No edema.     Left lower leg: No edema.  Skin:    General: Skin is warm.     Findings: No bruising.  Neurological:     General: No focal deficit present.     Mental Status: She is alert and oriented to person, place, and time.     Sensory: No sensory deficit.     Motor: No weakness.  Psychiatric:        Mood and Affect: Mood normal.        Behavior: Behavior normal.    BP (!) 149/62 (BP Location: Right Arm, Patient Position: Sitting, Cuff Size: Large)   Pulse 80   Temp 98.9 F (37.2 C) (Oral)   Ht 5' 4.5" (1.638 m)   Wt 159 lb 6.4 oz (72.3 kg)   LMP  (LMP Unknown)   SpO2 96%   BMI 26.94 kg/m  Wt Readings from Last 3 Encounters:  02/26/21 159 lb 6.4 oz (72.3 kg)  01/15/21 160 lb 1.9 oz (72.6 kg)  11/12/20 183 lb 1.9 oz (83.1 kg)    Health Maintenance Due  Topic Date Due   Hepatitis C Screening  Never done   TETANUS/TDAP  Never done   COVID-19 Vaccine (4 - Booster for Moderna series) 06/28/2020    There are no preventive care reminders to display for this patient.   Lab Results  Component Value Date   TSH 1.300 01/09/2021   Lab Results  Component Value Date   WBC 6.6 01/09/2021   HGB 9.0 (L) 01/09/2021   HCT 28.1 (L) 01/09/2021   MCV 76 (L) 01/09/2021   PLT 353 01/09/2021   Lab Results  Component Value Date  NA 140 01/09/2021   K 4.2 01/09/2021   CO2 23 01/09/2021   GLUCOSE 180 (H) 01/09/2021   BUN 11 01/09/2021   CREATININE 0.83 01/09/2021   BILITOT 0.4 01/09/2021   ALKPHOS 92 01/09/2021   AST 18 01/09/2021   ALT 16 01/09/2021   PROT 6.9 01/09/2021   ALBUMIN 4.4 01/09/2021   CALCIUM 9.7 01/09/2021   ANIONGAP 7 11/12/2020   EGFR 73 01/09/2021   Lab Results  Component Value Date   CHOL 245 (H) 01/09/2021   Lab Results  Component Value Date   HDL 40 01/09/2021   Lab Results  Component Value Date   LDLCALC 150 (H) 01/09/2021    Lab Results  Component Value Date   TRIG 296 (H) 01/09/2021   Lab Results  Component Value Date   CHOLHDL 6.1 (H) 01/09/2021   Lab Results  Component Value Date   HGBA1C 9.2 (H) 01/09/2021       Assessment & Plan:   Problem List Items Addressed This Visit       Cardiovascular and Mediastinum   Essential hypertension - Primary    BP Readings from Last 1 Encounters:  02/26/21 (!) 149/62  Better controlled with telmisartan 40 mg daily and amlodipine 10 mg daily now Counseled for compliance with the medications Advised DASH diet and moderate exercise/walking, at least 150 mins/week         Endocrine   Type 2 diabetes mellitus with hyperlipidemia (HCC)    Lab Results  Component Value Date   HGBA1C 9.2 (H) 01/09/2021  On Novolin and Metformin Takes Novolin according to ISS Has been noncompliant, but since she has Dexcom now, she states that she will take her medications regularly. Had started Lantus 20 U qHS and Ozempic, but she has not used it yet. Advised to follow diabetic diet On ARB and statin Diabetic eye exam: Advised to follow up with Ophthalmology for diabetic eye exam  Printed medication list and discussed their indications at length for better compliance.        No orders of the defined types were placed in this encounter.    Lindell Spar, MD

## 2021-02-26 NOTE — Assessment & Plan Note (Signed)
Lab Results  Component Value Date   HGBA1C 9.2 (H) 01/09/2021   On Novolin and Metformin Takes Novolin according to ISS Has been noncompliant, but since she has Dexcom now, she states that she will take her medications regularly. Had started Lantus 20 U qHS and Ozempic, but she has not used it yet. Advised to follow diabetic diet On ARB and statin Diabetic eye exam: Advised to follow up with Ophthalmology for diabetic eye exam  Printed medication list and discussed their indications at length for better compliance.

## 2021-02-26 NOTE — Patient Instructions (Signed)
Please continue taking medications as prescribed.

## 2021-03-01 ENCOUNTER — Ambulatory Visit: Payer: Medicare Other | Admitting: Urology

## 2021-03-04 ENCOUNTER — Telehealth: Payer: Self-pay | Admitting: Internal Medicine

## 2021-03-04 ENCOUNTER — Other Ambulatory Visit: Payer: Self-pay | Admitting: *Deleted

## 2021-03-04 DIAGNOSIS — N952 Postmenopausal atrophic vaginitis: Secondary | ICD-10-CM

## 2021-03-04 DIAGNOSIS — Z1231 Encounter for screening mammogram for malignant neoplasm of breast: Secondary | ICD-10-CM

## 2021-03-04 DIAGNOSIS — N3941 Urge incontinence: Secondary | ICD-10-CM

## 2021-03-04 NOTE — Telephone Encounter (Signed)
Pt called in regards to [place] 2 . Pt wants to have the [place] 3 instead

## 2021-03-04 NOTE — Telephone Encounter (Signed)
Referral placed for ob/gyn and order placed for mammo pt should here from ob within two weeks to set up appt and if you could please call and set up mammo for patient

## 2021-03-04 NOTE — Telephone Encounter (Signed)
Pt wants physical with pap as well a order sent in for mammogram Couldn't find stephanie

## 2021-03-04 NOTE — Telephone Encounter (Signed)
The libre 3 has not been introduced to pharmacies as of yet so we will not be able to fill the 3 she need to use the 2 until they receive the 3 please let her know

## 2021-03-04 NOTE — Telephone Encounter (Signed)
Please see bottom message

## 2021-03-05 ENCOUNTER — Other Ambulatory Visit: Payer: Self-pay | Admitting: *Deleted

## 2021-03-05 DIAGNOSIS — L309 Dermatitis, unspecified: Secondary | ICD-10-CM

## 2021-03-05 MED ORDER — DESONIDE 0.05 % EX OINT: 1.0000 "application " | TOPICAL_OINTMENT | Freq: Two times a day (BID) | CUTANEOUS | 1 refills | Status: DC

## 2021-03-05 NOTE — Telephone Encounter (Signed)
Pt called in with FYI. She called [facility] and said the Desonide ointment  0.5  60g  is okay

## 2021-03-06 ENCOUNTER — Telehealth: Payer: Self-pay | Admitting: *Deleted

## 2021-03-06 ENCOUNTER — Other Ambulatory Visit: Payer: Self-pay | Admitting: *Deleted

## 2021-03-06 ENCOUNTER — Ambulatory Visit: Payer: Medicare Other | Admitting: Urology

## 2021-03-06 DIAGNOSIS — Z79899 Other long term (current) drug therapy: Secondary | ICD-10-CM

## 2021-03-06 DIAGNOSIS — N9912 Postprocedural urethral stricture, female: Secondary | ICD-10-CM

## 2021-03-06 DIAGNOSIS — N3941 Urge incontinence: Secondary | ICD-10-CM

## 2021-03-06 NOTE — Chronic Care Management (AMB) (Signed)
  Chronic Care Management   Outreach Note  03/06/2021 Name: Tahjae Durr MRN: [id number] DOB: February 10, 1946  Shericka Johnstone is a 75 y.o. year old female who is a primary care patient of Lindell Spar, MD. I reached out to Nonda Lou by phone today in response to a referral sent by Ms. Neeva Demilio's primary care provider.  An unsuccessful telephone outreach was attempted today. The patient was referred to the case management team for assistance with care management and care coordination.   Follow Up Plan: A HIPAA compliant phone message was left for the patient providing contact information and requesting a return call.  The care management team will reach out to the patient again over the next 7 days.  If patient returns call to provider office, please advise to call [facility]* at [phone].*  [facility]  Direct Dial: [phone]

## 2021-03-07 NOTE — Chronic Care Management (AMB) (Signed)
  Chronic Care Management   Note  03/07/2021 Name: Jessica Mclean MRN: [id number] DOB: 02-09-1946  Jessica Mclean is a 75 y.o. year old female who is a primary care patient of Lindell Spar, MD. I reached out to Nonda Lou by phone today in response to a referral sent by [name]'s PCP.  [name] was given information about Chronic Care Management services today including:  CCM service includes personalized support from designated clinical staff supervised by her physician, including individualized plan of care and coordination with other care providers 24/7 contact phone numbers for assistance for urgent and routine care needs. Service will only be billed when office clinical staff spend 20 minutes or more in a month to coordinate care. Only one practitioner may furnish and bill the service in a calendar month. The patient may stop CCM services at any time (effective at the end of the month) by phone call to the office staff. The patient is responsible for co-pay (up to 20% after annual deductible is met) if co-pay is required by the individual health plan.   Patient agreed to services and verbal consent obtained.   Follow up plan: Telephone appointment with care management team member scheduled for:03/13/21  [facility]  Direct Dial: [phone]

## 2021-03-13 ENCOUNTER — Ambulatory Visit: Payer: Medicare Other

## 2021-03-14 ENCOUNTER — Other Ambulatory Visit: Payer: Self-pay

## 2021-03-14 ENCOUNTER — Ambulatory Visit (HOSPITAL_COMMUNITY)
Admission: RE | Admit: 2021-03-14 | Discharge: 2021-03-14 | Disposition: A | Discharge status: Home / Self Care | Payer: Medicare Other | Source: Ambulatory Visit | Attending: Internal Medicine | Admitting: Internal Medicine

## 2021-03-14 ENCOUNTER — Ambulatory Visit (INDEPENDENT_AMBULATORY_CARE_PROVIDER_SITE_OTHER): Payer: Medicare Other | Admitting: Pharmacist

## 2021-03-14 ENCOUNTER — Telehealth: Payer: Self-pay

## 2021-03-14 VITALS — BP 153/71 | HR 79

## 2021-03-14 DIAGNOSIS — E785 Hyperlipidemia, unspecified: Secondary | ICD-10-CM

## 2021-03-14 DIAGNOSIS — Z1231 Encounter for screening mammogram for malignant neoplasm of breast: Secondary | ICD-10-CM | POA: Insufficient documentation

## 2021-03-14 DIAGNOSIS — E1169 Type 2 diabetes mellitus with other specified complication: Secondary | ICD-10-CM

## 2021-03-14 DIAGNOSIS — I10 Essential (primary) hypertension: Secondary | ICD-10-CM

## 2021-03-14 NOTE — Patient Instructions (Signed)
Nonda Lou,  It was great to talk to you today!  Please call me with any questions or concerns.   Continue your current medications as prescribed.   Visit Information   PATIENT GOALS:   Goals Addressed             This Visit's Progress    Medication Management       Patient Goals/Self-Care Activities Patient will:  Focus on medication adherence by keeping up with prescription refills and either using a pill box or reminders to take your medications at the prescribed times Check blood sugar continuously with continuous glucose monitor, document, and provide at future appointments Check blood pressure at least once daily, document, and provide at future appointments Target a minimum of 150 minutes of moderate intensity exercise weekly Engage in dietary modifications by decreased fat intake and fewer sweetened foods & beverages        Consent to CCM Services: Ms. Leiphart was given information about Chronic Care Management services including:  CCM service includes personalized support from designated clinical staff supervised by her physician, including individualized plan of care and coordination with other care providers 24/7 contact phone numbers for assistance for urgent and routine care needs. Service will only be billed when office clinical staff spend 20 minutes or more in a month to coordinate care. Only one practitioner may furnish and bill the service in a calendar month. The patient may stop CCM services at any time (effective at the end of the month) by phone call to the office staff. The patient will be responsible for cost sharing (co-pay) of up to 20% of the service fee (after annual deductible is met).  Patient agreed to services and verbal consent obtained.   The patient verbalized understanding of instructions, educational materials, and care plan provided today and declined offer to receive copy of patient instructions, educational materials, and care plan.    Telephone follow up appointment with care management team member scheduled for: 03/27/21  Kennon Holter, PharmD Clinical Pharmacist [facility] [phone]  CLINICAL CARE PLAN: Patient Care Plan: Medication Management     Problem Identified: HTN, T2DM, HLD   Priority: High  Onset Date: 03/14/2021     Long-Range Goal: Disease Progression Prevention   Start Date: 03/14/2021  Expected End Date: 06/12/2021  This Visit's Progress: On track  Priority: High  Note:   Current Barriers:  Unable to independently monitor therapeutic efficacy Unable to achieve control of diabetes, hypertension, and hyperlipidemia  Pharmacist Clinical Goal(s):  Patient will achieve adherence to monitoring guidelines and medication adherence to achieve therapeutic efficacy achieve control of diabetes, hypertension, and hyperlipidemia as evidenced by improved fasting blood sugar, improved A1c, improved blood pressure control, improved LDL, and improved triglycerides through collaboration with PharmD and provider.   Interventions: 1:1 collaboration with Lindell Spar, MD regarding development and update of comprehensive plan of care as evidenced by provider attestation and co-signature Inter-disciplinary care team collaboration (see longitudinal plan of care) Comprehensive medication review performed; medication list updated in electronic medical record  Type 2 Diabetes - New goal.: Uncontrolled; Most recent A1c above goal of <7% per ADA guidelines Current medications: metformin 1,000 mg by mouth twice daily and Humulin R (U-100) three time daily per sliding scale Patient prescribed Lantus and Ozempic by PCP but patient unsure how to use as she had never used any type of pen device before Intolerances: none Taking medications as directed: no, patient had not started Lantus or Ozempic yet Side effects  thought to be attributed to current medication regimen: no Hypoglycemia prevention:  none but will recommend Current meal patterns:  patient reports she is a vegetarian Current exercise:  walks a lot On a statin: yes On aspirin 81 mg daily: no Last microalbumin: unknown; on an ACEi/ARB: yes Last eye exam: completed within last year Last foot exam: completed within last year Pneumonia vaccine: unknown Influenza vaccine: up to date Shingles: up to date Current glucose readings:  Patient was instructed appropriate technique for injection of Ozempic and Lantus today in the office. Patient took first dose of Ozempic under my supervision and guidance. Tolerated without issue. Patient did not have pen tips for insulin. PCP notified to send prescription to [facility] per patient request. Patient has a few pen tips she can use until she receives from mail order.  Patient instructed to contact out office if blood glucose consistently <80 or >250 since she will now begin Lantus and Ozempic Patient instructed to only use Humulin R per sliding scale that was previously given to her and that she will likely not need to use it as often or inject as much since she is not using Lantus and GLP-1 agonist Patient's blood glucose appears to have improved since using the [employer] 2 (see report below). Average blood glucose over last 2 weeks is 163 with estimated A1c ~7.2%.  Continue metformin 1,000 mg by mouth twice daily, semaglutide (Ozempic) 0.25 mg subcutaneously weekly, and insulin glargine (Lantus) 20 units subcutaneously at bedtime and Humulin R (U-100) three time daily as needed per sliding scale Instructed to monitor blood sugars continuously with continuous glucose monitor  Encouraged regular aerobic exercise with a goal of 30 minutes five times per week (150 minutes per week) Discussed management of hypoglycemia. If blood sugar <70 at any time, treat with simple sugar such as 1/2 cup juice or regular soda or 3-4 glucose tablets. Recheck blood sugar in 15 minutes and repeat if blood sugar  remains <70.     Hypertension - New goal.: Blood pressure under poor control. Blood pressure is above goal of <130/80 mmHg per 2017 AHA/ACC guidelines. Patient reports that she had not yet taken her blood pressure medications today. Unclear compliance. Will plan to do through medication reconciliation next visit. Current medications: telmisartan 40 mg by mouth once daily and amlodipine 10 mg by mouth once daily Intolerances: none Taking medications as directed: yes Side effects thought to be attributed to current medication regimen: no Current home blood pressure: not discussed today Continue telmisartan 40 mg by mouth once daily and amlodipine 10 mg by mouth once daily. Unclear if compliant. Will determine if patient has these medications at next visit. If so, may need to adjust regimen. Encourage dietary sodium restriction/DASH diet Recommend regular aerobic exercise Recommend home blood pressure monitoring to discuss at next visit Discussed need for medication compliance  Hyperlipidemia/History of TIA - New goal.: Uncontrolled. LDL above goal of <70 due to very high risk given established clinical ASCVD per 2020 AACE/ACE guidelines. Triglycerides above goal of <150 per 2020 AACE/ACE guidelines. Current medications: atorvastatin 80 mg by mouth once daily Intolerances: none Taking medications as directed: yes Side effects thought to be attributed to current medication regimen: no Continue atorvastatin 80 mg by mouth once daily Consider addition of ezetimibe 10 mg by mouth daily Encourage dietary reduction of high fat containing foods such as butter, nuts, bacon, egg yolks, etc. Recommend regular aerobic exercise Reviewed risks of hyperlipidemia, principles of treatment and consequences of untreated  hyperlipidemia Discussed need for medication compliance Re-check lipid panel in 4-12 weeks  Patient Goals/Self-Care Activities Patient will:  Focus on medication adherence by keeping up  with prescription refills and either using a pill box or reminders to take your medications at the prescribed times Check blood sugar continuously with continuous glucose monitor, document, and provide at future appointments Check blood pressure at least once daily, document, and provide at future appointments Target a minimum of 150 minutes of moderate intensity exercise weekly Engage in dietary modifications by decreased fat intake and fewer sweetened foods & beverages  Follow Up Plan: Telephone follow up appointment with care management team member scheduled for: 03/27/21        Ozempic (semaglutide)   What is this medicine used for: Used to lower your blood sugar and treat your diabetes.  How to take the medicine:  With each use of the pen: Take your pen out of the refrigerator and let it come to room temperature (about 15 minutes). Check the pen window to make sure that Ozempic in your pen is clear and colorless (if the medication looks cloudy, do not use the pen) Pull off pen cap and wipe rubber stopper tip with alcohol swab Twist on a new needle and pull off outer cap, do not throw this away Pull off inner needle cap and throw it away Before the first time you use a new pen (priming the pen): Turn the dose selector until the dose counter shows the "flow check symbol" Hold pen upright and press the dose button until the dose counter shows 0 Repeat until a drop of the medicine appears at the tip of the needle Using the pen and injecting your dose: Dial your dose using the dose selector Choose your injection site (stomach, thigh, or upper arm) and wipe the skin with an alcohol swab. Let dry before injecting your dose Insert the needle into the skin at your injection site. Make sure you can see the dose counter, do not cover with your hand Press down on the dose button to inject until the dose counter shows 0 and lines up with the dose pointer. You may hear or feel a click Keep the  needle in your skin and count slowly for 6 seconds Withdraw the needle from your skin and carefully cap the needle using the outer needle cap. Unscrew the needle from the pen and place the needle in a puncture-resistant container Rotate injection sites weekly  Most common side effects: Nausea/Vomiting Diarrhea Abdominal pain  Storage: How Supplied: Each carton contains 1-2 pens  Each pen contains 2-4 doses Store new, unopened pens in the refrigerator. Do not freeze. Store your used pen at room temperature or in the refrigerator for 56 days. Pens should be thrown away after 56 days, even if they still have medication remaining.   Freestyle [employer]  1. For help or to see the videos: https://www.freestyle.abbott/us-en/support.html  2. You should have a sensor, sensor applicator, and a display device (Freestyle [place] Reader or [place]) Each sensor is good for 14 days Smart phone compatibility can be found here: https://www.freestyle.abbott/ie/en/librelink/compatibility-guide.html            3. You will still need your glucose meter If you take more than 500 mg of vitamin C per day. This may cause falsely high blood sugar readings.  Any time your symptoms do not match the reading on your Freestyle [place] Any time you don't have a number and an arrow  4. If you have a MRI or CT scan The company recommends you not wear the [facility] as it is unknown if the heat and magnetic fields could damage the components. To get the most out of your session, we advise that you try to schedule your procedure near the end of your sensor session to avoid needing an extra sensor.  5. Apply sensors on the back of your upper arm. Change the site where you place the sensor with each new insertion. If there is adhesive residue left on the skin, you can try applying a household oil like baby oil or coconut oil.  6. Avoid injecting insulin within 3 inches of the  sensor.  7. Water. The sensor is water resistant, but the receiver is not. You can still swim, shower, and take a bath. Do NOT take your sensor deeper than 3 feet (1 meter) or immerse it longer than 30 minutes in water. Your receiver needs to be in a dry area and within 20 feet of the transmitter to get readings.    8. If you want to use your smartphone instead of the [place]. You will need to create a LibreView account.   The app and the receiver do not talk to each other. You will have to set up alerts in the app as well.   9. Please call [place] if you have any questions or issues. Customer Service is available at [phone] 7 Days a Week from 8AM to 8PM (excluding holidays).  10. The sensor glucose may not always be the same as your blood glucose. Sensor glucose is a glucose estimate that lags behind the blood glucose.      11. Connect your [place] with our clinic so we can monitor your blood sugars even when you are not in clinic. Go to your [employer] app Click "Settings" at top left  Click "Connected Apps" Click "LibreView" Add Clinic ID # to connect: primarycare  Common Concerns  Problems with Freestyle sticking? Order Skin Tac from [facility]. Alcohol swab area you plan to administer Freestyle Libre then let dry. Once dry, apply Skin Tac in a circular motion (with a spot in the middle for sensor without skin tac) and let dry. Once dry you can apply Freestyle [place]!   Problems taking off Freestyle Libre? Remember to try to shower/bathe before removing Freestyle Libre Order Tac Away to help remove any extra adhesive left on your skin once you remove Freestyle Libre  Problems with irritation? Before applying Freestyle Libre: Apply Nasacort nasal spray (can buy over the counter) to area you will apply Freestyle Libre --> alcohol swab --> Skin Tac --> Freestyle Libre After applying Freestyle  Libre: Apply hydrocortisone cream (can buy over the counter) to area until redness resolves

## 2021-03-14 NOTE — Telephone Encounter (Signed)
Per pt phone call 03-04-21 she was requesting pap and patel does not do paps that is why she is referred to ob you can let pt know

## 2021-03-14 NOTE — Telephone Encounter (Signed)
Patient called she needs some clarification on why she was referred over to GYN. Phone # [phone]. Please contact patient Friday, 10.27.2022.

## 2021-03-14 NOTE — Chronic Care Management (AMB) (Signed)
Chronic Care Management Pharmacy Note  03/14/2021 Name:  Jessica Mclean MRN:  [id number] DOB:  Nov 06, 1945  Summary:  General: Patient brought medications in her pill box today. Unable to identify if she has all of her prescribed medications. Per fill history, compliance may be poor. Will complete through medication reconciliation to determine next steps at next visit.  Type 2 Diabetes Patient prescribed Lantus and Ozempic by PCP but patient unsure how to use as she had never used any type of pen device before Patient was instructed appropriate technique for injection of Ozempic and Lantus today in the office. Patient took first dose of Ozempic under my supervision and guidance. Tolerated without issue. Patient did not have pen tips for insulin. PCP notified to send prescription to [facility] per patient request. Patient has a few pen tips she can use until she receives from mail order.  Patient instructed to contact out office if blood glucose consistently <80 or >250 since she will now begin Lantus and Ozempic Patient instructed to only use Humulin R per sliding scale that was previously given to her and that she will likely not need to use it as often or inject as much since she is not using Lantus and GLP-1 agonist Patient's blood glucose appears to have improved since using the [employer] 2 (see report below). Average blood glucose over last 2 weeks is 163 with estimated A1c ~7.2%.  Continue metformin 1,000 mg by mouth twice daily, semaglutide (Ozempic) 0.25 mg subcutaneously weekly, and insulin glargine (Lantus) 20 units subcutaneously at bedtime and Humulin R (U-100) three time daily as needed per sliding scale  Hypertension  Continue telmisartan 40 mg by mouth once daily and amlodipine 10 mg by mouth once daily. Unclear if compliant. Will determine if patient has these medications at next visit. If so, may need to adjust regimen.  Hyperlipidemia/History of TIA Continue atorvastatin  80 mg by mouth once daily Consider addition of ezetimibe 10 mg by mouth daily Re-check lipid panel in 4-12 weeks  Subjective: Jessica Mclean is an 75 y.o. year old female who is a primary patient of Lindell Spar, MD.  The CCM team was consulted for assistance with disease management and care coordination needs.    Engaged with patient face to face for initial visit in response to provider referral for pharmacy case management and/or care coordination services.   Consent to Services:  The patient was given the following information about Chronic Care Management services today, agreed to services, and gave verbal consent: 1. CCM service includes personalized support from designated clinical staff supervised by the primary care provider, including individualized plan of care and coordination with other care providers 2. 24/7 contact phone numbers for assistance for urgent and routine care needs. 3. Service will only be billed when office clinical staff spend 20 minutes or more in a month to coordinate care. 4. Only one practitioner may furnish and bill the service in a calendar month. 5.The patient may stop CCM services at any time (effective at the end of the month) by phone call to the office staff. 6. The patient will be responsible for cost sharing (co-pay) of up to 20% of the service fee (after annual deductible is met). Patient agreed to services and consent obtained.  Patient Care Team: Lindell Spar, MD as PCP - General (Internal Medicine) Jani Gravel, MD (Internal Medicine) Beryle Lathe, [facility] (Pharmacist)  Objective:  Lab Results  Component Value Date   CREATININE 0.83 01/09/2021  CREATININE 0.79 11/12/2020   CREATININE 0.90 12/11/2019    Lab Results  Component Value Date   HGBA1C 9.2 (H) 01/09/2021   Last diabetic Eye exam:  Lab Results  Component Value Date/Time   HMDIABEYEEXA No Retinopathy 06/26/2020 12:00 AM    Last diabetic Foot exam: No results found for:  HMDIABFOOTEX      Component Value Date/Time   CHOL 245 (H) 01/09/2021 1421   TRIG 296 (H) 01/09/2021 1421   HDL 40 01/09/2021 1421   CHOLHDL 6.1 (H) 01/09/2021 1421   CHOLHDL 5.7 11/23/2019 1504   VLDL 58 (H) 11/23/2019 1504   LDLCALC 150 (H) 01/09/2021 1421    Hepatic Function Latest Ref Rng & Units 01/09/2021 12/11/2019 11/23/2019  Total Protein 6.0 - 8.5 g/dL 6.9 6.8 6.7  Albumin 3.7 - 4.7 g/dL 4.4 3.7 3.7  AST 0 - 40 IU/L 18 26 16   ALT 0 - 32 IU/L 16 25 12   Alk Phosphatase 44 - 121 IU/L 92 72 64  Total Bilirubin 0.0 - 1.2 mg/dL 0.4 0.7 0.4    Lab Results  Component Value Date/Time   TSH 1.300 01/09/2021 02:21 PM    CBC Latest Ref Rng & Units 01/09/2021 12/11/2019 12/11/2019  WBC 3.4 - 10.8 x10E3/uL 6.6 - 5.7  Hemoglobin 11.1 - 15.9 g/dL 9.0(L) 11.6(L) 10.0(L)  Hematocrit 34.0 - 46.6 % 28.1(L) 34.0(L) 32.8(L)  Platelets 150 - 450 x10E3/uL 353 - 297    No results found for: VD25OH  Clinical ASCVD: No  The 10-year ASCVD risk score (Arnett DK, et al., 2019) is: 48.6%   Values used to calculate the score:     Age: 62 years     Sex: Female     Is Non-Hispanic African American: No     Diabetic: Yes     Tobacco smoker: No     Systolic Blood Pressure: 881 mmHg     Is BP treated: Yes     HDL Cholesterol: 40 mg/dL     Total Cholesterol: 245 mg/dL     Social History   Tobacco Use  Smoking Status Never  Smokeless Tobacco Never   BP Readings from Last 3 Encounters:  03/14/21 (!) 153/71  02/26/21 (!) 149/62  01/15/21 (!) 180/88   Pulse Readings from Last 3 Encounters:  03/14/21 79  02/26/21 80  01/15/21 79   Wt Readings from Last 3 Encounters:  02/26/21 159 lb 6.4 oz (72.3 kg)  01/15/21 160 lb 1.9 oz (72.6 kg)  11/12/20 183 lb 1.9 oz (83.1 kg)    Assessment: Review of patient past medical history, allergies, medications, health status, including review of consultants reports, laboratory and other test data, was performed as part of comprehensive evaluation and  provision of chronic care management services.   SDOH:  (Social Determinants of Health) assessments and interventions performed:    CCM Care Plan  Allergies  Allergen Reactions   Other     Nickel staples    Medications Reviewed Today     Reviewed by Beryle Lathe, [facility] (Pharmacist) on 03/14/21 at 1546  Med List Status: <None>   Medication Order Taking? Sig Documenting Provider Last Dose Status Informant  ACCU-CHEK AVIVA PLUS test strip [id number]   [provider]  Active Self  Accu-Chek Softclix Lancets lancets [id number]  SMARTSIG:Topical [provider]  Active Self  amLODipine (NORVASC) 10 MG tablet [id number] Yes Take 1 tablet (10 mg total) by mouth daily. Lindell Spar, MD Taking Active   aspirin-acetaminophen-caffeine [facility] Wyonia Hough  STRENGTH) 250-250-65 MG tablet [id number]  Take 2 tablets by mouth in the morning. [provider]  Active Self  atorvastatin (LIPITOR) 80 MG tablet [id number] Yes TAKE 1 TABLET BY MOUTH  DAILY  Patient taking differently: Take 80 mg by mouth daily.   Lindell Spar, MD Taking Active   Blood Glucose Monitoring Suppl (ACCU-CHEK GUIDE) w/Device Drucie Opitz [id number]   [provider]  Active Self  calcium-vitamin D (OSCAL-500) 500-400 MG-UNIT tablet [id number] Yes Take 1 tablet by mouth daily. [provider] Taking Active Self  Continuous Blood Gluc Receiver (FREESTYLE LIBRE 2 READER) DEVI [id number]  As directed Lindell Spar, MD  Active Self  Continuous Blood Gluc Sensor (FREESTYLE LIBRE 2 SENSOR) MISC [id number]  1 Piece by Does not apply route every 14 (fourteen) days. Lindell Spar, MD  Active Self  desonide (DESOWEN) 0.05 % ointment [id number]  Apply 1 application topically 2 (two) times daily. Lindell Spar, MD  Active   diphenhydramine-acetaminophen (TYLENOL PM) 25-500 MG TABS tablet [id number]  Take 1 tablet by mouth at bedtime.  Patient not taking: Reported on 02/26/2021   [provider]  Active Self    Discontinued 10/01/19 1034   HUMULIN R 100 UNIT/ML injection [id number] Yes INJECT 10 UNITS  SUBCUTANEOUSLY 3 TIMES  DAILY BEFORE MEALS [name], Colin Broach, MD Taking Active   insulin glargine (LANTUS SOLOSTAR) 100 UNIT/ML Solostar Pen [id number] No Inject 20 Units into the skin at bedtime.  Patient not taking: Reported on 03/14/2021   Lindell Spar, MD Not Taking Active   Insulin Syringe-Needle U-100 (BD INSULIN SYRINGE U/F) 31G X 5/16" 0.3 ML MISC [id number]  USE AS DIRECTED Lindell Spar, MD  Active Self  metFORMIN (GLUCOPHAGE) 1000 MG tablet [id number] Yes TAKE 1 TABLET BY MOUTH  TWICE DAILY WITH MEALS Lindell Spar, MD Taking Active   omega-3 acid ethyl esters (LOVAZA) 1 g capsule [id number] Yes Take 2 g by mouth daily. [provider] Taking Active Self  OZEMPIC, 0.25 OR 0.5 MG/DOSE, 2 MG/1.5ML SOPN [id number] No INJECT SUBCUTANEOUSLY 0.5  MG EVERY WEEK  Patient not taking: No sig reported   Lindell Spar, MD Not Taking Active   phenazopyridine (PYRIDIUM) 100 MG tablet [id number] Yes Take 1 tablet (100 mg total) by mouth 3 (three) times daily as needed for pain. McKenzie, Candee Furbish, MD Taking Active   polyethylene glycol powder [facility]) 17 GM/SCOOP powder [id number] Yes Take 17 g by mouth daily as needed for mild constipation or moderate constipation.  [provider] Taking Active Self  tamsulosin (FLOMAX) 0.4 MG CAPS capsule [id number] No Take 1 capsule (0.4 mg total) by mouth daily after supper.  Patient not taking: No sig reported   McKenzie, Candee Furbish, MD Not Taking Active   telmisartan (MICARDIS) 40 MG tablet [id number] Yes Take 1 tablet (40 mg total) by mouth daily. Lindell Spar, MD Taking Active   traMADol Veatrice Bourbon) 50 MG tablet [id number] No Take 1 tablet (50 mg total) by mouth every 6 (six) hours as needed.  Patient not taking: No sig reported   Cleon Gustin, MD Not Taking Active             Patient Active Problem List    Diagnosis Date Noted   Microcytic anemia 01/15/2021   Need for immunization against influenza 01/15/2021   Postprocedural female urethral stricture 11/27/2020   Idiopathic peripheral neuropathy 06/14/2020   Diabetic neuropathy ([place]) 06/14/2020   Gross hematuria 06/11/2020  Urge incontinence 03/08/2020   Essential hypertension 11/23/2019   Type 2 diabetes mellitus with hyperlipidemia ([place]) 11/23/2019   TIA (transient ischemic attack) 11/22/2019    Immunization History  Administered Date(s) Administered   Fluad Quad(high Dose 65+) 03/06/2020, 01/15/2021   Moderna Sars-Covid-2 Vaccination 05/31/2019, 07/01/2019, 04/05/2020   Zoster Recombinat (Shingrix) 03/10/2018, 06/22/2018    Conditions to be addressed/monitored: HTN, HLD, and DMII  Care Plan : Medication Management  Updates made by Beryle Lathe, [place] since 03/14/2021 12:00 AM     Problem: HTN, T2DM, HLD   Priority: High  Onset Date: 03/14/2021     Long-Range Goal: Disease Progression Prevention   Start Date: 03/14/2021  Expected End Date: 06/12/2021  This Visit's Progress: On track  Priority: High  Note:   Current Barriers:  Unable to independently monitor therapeutic efficacy Unable to achieve control of diabetes, hypertension, and hyperlipidemia  Pharmacist Clinical Goal(s):  Patient will achieve adherence to monitoring guidelines and medication adherence to achieve therapeutic efficacy achieve control of diabetes, hypertension, and hyperlipidemia as evidenced by improved fasting blood sugar, improved A1c, improved blood pressure control, improved LDL, and improved triglycerides through collaboration with PharmD and provider.   Interventions: 1:1 collaboration with Lindell Spar, MD regarding development and update of comprehensive plan of care as evidenced by provider attestation and co-signature Inter-disciplinary care team collaboration (see longitudinal plan of care) Comprehensive medication review  performed; medication list updated in electronic medical record  Type 2 Diabetes - New goal.: Uncontrolled; Most recent A1c above goal of <7% per ADA guidelines Current medications: metformin 1,000 mg by mouth twice daily and Humulin R (U-100) three time daily per sliding scale Patient prescribed Lantus and Ozempic by PCP but patient unsure how to use as she had never used any type of pen device before Intolerances: none Taking medications as directed: no, patient had not started Lantus or Ozempic yet Side effects thought to be attributed to current medication regimen: no Hypoglycemia prevention: none but will recommend Current meal patterns:  patient reports she is a vegetarian Current exercise:  walks a lot On a statin: yes On aspirin 81 mg daily: no Last microalbumin: unknown; on an ACEi/ARB: yes Last eye exam: completed within last year Last foot exam: completed within last year Pneumonia vaccine: unknown Influenza vaccine: up to date Shingles: up to date Current glucose readings:  Patient was instructed appropriate technique for injection of Ozempic and Lantus today in the office. Patient took first dose of Ozempic under my supervision and guidance. Tolerated without issue. Patient did not have pen tips for insulin. PCP notified to send prescription to [facility] per patient request. Patient has a few pen tips she can use until she receives from mail order.  Patient instructed to contact out office if blood glucose consistently <80 or >250 since she will now begin Lantus and Ozempic Patient instructed to only use Humulin R per sliding scale that was previously given to her and that she will likely not need to use it as often or inject as much since she is not using Lantus and GLP-1 agonist Patient's blood glucose appears to have improved since using the [employer] 2 (see report below). Average blood glucose over last 2 weeks is 163 with estimated A1c ~7.2%.  Continue metformin 1,000  mg by mouth twice daily, semaglutide (Ozempic) 0.25 mg subcutaneously weekly, and insulin glargine (Lantus) 20 units subcutaneously at bedtime and Humulin R (U-100) three time daily as needed per sliding scale Instructed to monitor blood  sugars continuously with continuous glucose monitor  Encouraged regular aerobic exercise with a goal of 30 minutes five times per week (150 minutes per week) Discussed management of hypoglycemia. If blood sugar <70 at any time, treat with simple sugar such as 1/2 cup juice or regular soda or 3-4 glucose tablets. Recheck blood sugar in 15 minutes and repeat if blood sugar remains <70.     Hypertension - New goal.: Blood pressure under poor control. Blood pressure is above goal of <130/80 mmHg per 2017 AHA/ACC guidelines. Patient reports that she had not yet taken her blood pressure medications today. Unclear compliance. Will plan to do through medication reconciliation next visit. Current medications: telmisartan 40 mg by mouth once daily and amlodipine 10 mg by mouth once daily Intolerances: none Taking medications as directed: yes Side effects thought to be attributed to current medication regimen: no Current home blood pressure: not discussed today Continue telmisartan 40 mg by mouth once daily and amlodipine 10 mg by mouth once daily. Unclear if compliant. Will determine if patient has these medications at next visit. If so, may need to adjust regimen. Encourage dietary sodium restriction/DASH diet Recommend regular aerobic exercise Recommend home blood pressure monitoring to discuss at next visit Discussed need for medication compliance  Hyperlipidemia/History of TIA - New goal.: Uncontrolled. LDL above goal of <70 due to very high risk given established clinical ASCVD per 2020 AACE/ACE guidelines. Triglycerides above goal of <150 per 2020 AACE/ACE guidelines. Current medications: atorvastatin 80 mg by mouth once daily Intolerances: none Taking  medications as directed: yes Side effects thought to be attributed to current medication regimen: no Continue atorvastatin 80 mg by mouth once daily Consider addition of ezetimibe 10 mg by mouth daily Encourage dietary reduction of high fat containing foods such as butter, nuts, bacon, egg yolks, etc. Recommend regular aerobic exercise Reviewed risks of hyperlipidemia, principles of treatment and consequences of untreated hyperlipidemia Discussed need for medication compliance Re-check lipid panel in 4-12 weeks  Patient Goals/Self-Care Activities Patient will:  Focus on medication adherence by keeping up with prescription refills and either using a pill box or reminders to take your medications at the prescribed times Check blood sugar continuously with continuous glucose monitor, document, and provide at future appointments Check blood pressure at least once daily, document, and provide at future appointments Target a minimum of 150 minutes of moderate intensity exercise weekly Engage in dietary modifications by decreased fat intake and fewer sweetened foods & beverages  Follow Up Plan: Telephone follow up appointment with care management team member scheduled for: 03/27/21      Medication Assistance: None required.  Patient affirms current coverage meets needs.  Patient's preferred pharmacy is:  [id number] - [place], [place] - [address] AT [place] [place] [address] Phone: [phone] Fax: [phone]  [id number] [place] [place]Phone: [phone]: [phone] [facility]  [address]Phone: [phone]: [phone] [facility] ([facility]) [place] [address]Phone: [phone]: [phone] Uses pill box? Yes  Follow Up:  Patient agrees to Care Plan and Follow-up.  Plan: Telephone follow up appointment with care management team member scheduled for:  03/27/21  Kennon Holter, PharmD Clinical Pharmacist [facility] [phone]'

## 2021-03-15 NOTE — Telephone Encounter (Signed)
Called patient and let her know why she was referred over to GYN.

## 2021-03-18 ENCOUNTER — Other Ambulatory Visit: Payer: Self-pay | Admitting: Internal Medicine

## 2021-03-18 DIAGNOSIS — E785 Hyperlipidemia, unspecified: Secondary | ICD-10-CM

## 2021-03-18 DIAGNOSIS — E1169 Type 2 diabetes mellitus with other specified complication: Secondary | ICD-10-CM

## 2021-03-18 DIAGNOSIS — I10 Essential (primary) hypertension: Secondary | ICD-10-CM | POA: Diagnosis not present

## 2021-03-19 ENCOUNTER — Other Ambulatory Visit: Payer: Self-pay | Admitting: *Deleted

## 2021-03-19 MED ORDER — INSUPEN PEN NEEDLES 31G X 5 MM MISC: 1.0000 | Freq: Three times a day (TID) | 1 refills | Status: DC

## 2021-03-21 ENCOUNTER — Other Ambulatory Visit: Payer: Self-pay | Admitting: Internal Medicine

## 2021-03-21 DIAGNOSIS — Z23 Encounter for immunization: Secondary | ICD-10-CM | POA: Diagnosis not present

## 2021-03-23 ENCOUNTER — Emergency Department (HOSPITAL_COMMUNITY): Payer: Medicare Other

## 2021-03-23 ENCOUNTER — Emergency Department (HOSPITAL_COMMUNITY)
Admission: EM | Admit: 2021-03-23 | Discharge: 2021-03-23 | Disposition: A | Discharge status: Home / Self Care | Payer: Medicare Other | Attending: Emergency Medicine | Admitting: Emergency Medicine

## 2021-03-23 ENCOUNTER — Encounter (HOSPITAL_COMMUNITY): Payer: Self-pay

## 2021-03-23 ENCOUNTER — Other Ambulatory Visit: Payer: Self-pay

## 2021-03-23 DIAGNOSIS — Z7984 Long term (current) use of oral hypoglycemic drugs: Secondary | ICD-10-CM | POA: Insufficient documentation

## 2021-03-23 DIAGNOSIS — Z79899 Other long term (current) drug therapy: Secondary | ICD-10-CM | POA: Diagnosis not present

## 2021-03-23 DIAGNOSIS — R42 Dizziness and giddiness: Secondary | ICD-10-CM | POA: Insufficient documentation

## 2021-03-23 DIAGNOSIS — Z7982 Long term (current) use of aspirin: Secondary | ICD-10-CM | POA: Diagnosis not present

## 2021-03-23 DIAGNOSIS — Z794 Long term (current) use of insulin: Secondary | ICD-10-CM | POA: Insufficient documentation

## 2021-03-23 DIAGNOSIS — M79603 Pain in arm, unspecified: Secondary | ICD-10-CM | POA: Diagnosis not present

## 2021-03-23 DIAGNOSIS — I10 Essential (primary) hypertension: Secondary | ICD-10-CM | POA: Diagnosis not present

## 2021-03-23 DIAGNOSIS — R531 Weakness: Secondary | ICD-10-CM | POA: Diagnosis not present

## 2021-03-23 DIAGNOSIS — E1140 Type 2 diabetes mellitus with diabetic neuropathy, unspecified: Secondary | ICD-10-CM | POA: Insufficient documentation

## 2021-03-23 DIAGNOSIS — R404 Transient alteration of awareness: Secondary | ICD-10-CM | POA: Diagnosis not present

## 2021-03-23 DIAGNOSIS — R5381 Other malaise: Secondary | ICD-10-CM | POA: Diagnosis not present

## 2021-03-23 DIAGNOSIS — Z8541 Personal history of malignant neoplasm of cervix uteri: Secondary | ICD-10-CM | POA: Diagnosis not present

## 2021-03-23 DIAGNOSIS — Z743 Need for continuous supervision: Secondary | ICD-10-CM | POA: Diagnosis not present

## 2021-03-23 DIAGNOSIS — E1165 Type 2 diabetes mellitus with hyperglycemia: Secondary | ICD-10-CM | POA: Diagnosis not present

## 2021-03-23 DIAGNOSIS — W19XXXA Unspecified fall, initial encounter: Secondary | ICD-10-CM | POA: Diagnosis not present

## 2021-03-23 LAB — CBC WITH DIFFERENTIAL/PLATELET
Abs Immature Granulocytes: 0.01 10*3/uL (ref 0.00–0.07)
Basophils Absolute: 0.1 10*3/uL (ref 0.0–0.1)
Basophils Relative: 1 %
Eosinophils Absolute: 0.2 10*3/uL (ref 0.0–0.5)
Eosinophils Relative: 3 %
HCT: 30 % — ABNORMAL LOW (ref 36.0–46.0)
Hemoglobin: 9.2 g/dL — ABNORMAL LOW (ref 12.0–15.0)
Immature Granulocytes: 0 %
Lymphocytes Relative: 29 %
Lymphs Abs: 1.8 10*3/uL (ref 0.7–4.0)
MCH: 24.5 pg — ABNORMAL LOW (ref 26.0–34.0)
MCHC: 30.7 g/dL (ref 30.0–36.0)
MCV: 79.8 fL — ABNORMAL LOW (ref 80.0–100.0)
Monocytes Absolute: 0.6 10*3/uL (ref 0.1–1.0)
Monocytes Relative: 10 %
Neutro Abs: 3.4 10*3/uL (ref 1.7–7.7)
Neutrophils Relative %: 57 %
Platelets: 333 10*3/uL (ref 150–400)
RBC: 3.76 MIL/uL — ABNORMAL LOW (ref 3.87–5.11)
RDW: 17.1 % — ABNORMAL HIGH (ref 11.5–15.5)
WBC: 6 10*3/uL (ref 4.0–10.5)
nRBC: 0 % (ref 0.0–0.2)

## 2021-03-23 LAB — COMPREHENSIVE METABOLIC PANEL
ALT: 16 U/L (ref 0–44)
AST: 22 U/L (ref 15–41)
Albumin: 3.9 g/dL (ref 3.5–5.0)
Alkaline Phosphatase: 75 U/L (ref 38–126)
Anion gap: 11 (ref 5–15)
BUN: 16 mg/dL (ref 8–23)
CO2: 23 mmol/L (ref 22–32)
Calcium: 9.3 mg/dL (ref 8.9–10.3)
Chloride: 102 mmol/L (ref 98–111)
Creatinine, Ser: 0.83 mg/dL (ref 0.44–1.00)
GFR, Estimated: >60 mL/min (ref >60)
Glucose, Bld: 173 mg/dL — ABNORMAL HIGH (ref 70–99)
Potassium: 4.3 mmol/L (ref 3.5–5.1)
Sodium: 136 mmol/L (ref 135–145)
Total Bilirubin: 0.2 mg/dL — ABNORMAL LOW (ref 0.3–1.2)
Total Protein: 7.6 g/dL (ref 6.5–8.1)

## 2021-03-23 LAB — TROPONIN I (HIGH SENSITIVITY)
Troponin I (High Sensitivity): 3 ng/L (ref <18)
Troponin I (High Sensitivity): 3 ng/L (ref <18)

## 2021-03-23 MED ORDER — MECLIZINE HCL 25 MG PO TABS: 25.0000 mg | ORAL_TABLET | Freq: Three times a day (TID) | ORAL | 0 refills | Status: DC | PRN

## 2021-03-23 MED ORDER — MECLIZINE HCL 12.5 MG PO TABS
25.0000 mg | ORAL_TABLET | Freq: Once | ORAL | Status: AC
  Administered 2021-03-23: 25 mg via ORAL
  Filled 2021-03-23: qty 2

## 2021-03-23 MED ORDER — SODIUM CHLORIDE 0.9 % IV BOLUS
500.0000 mL | Freq: Once | INTRAVENOUS | Status: AC
  Administered 2021-03-23: 500 mL via INTRAVENOUS

## 2021-03-23 NOTE — ED Provider Notes (Signed)
[facility] EMERGENCY DEPARTMENT Provider Note   CSN: [id number] Arrival date & time: 03/23/21  2023     History Chief Complaint  Patient presents with   Weakness    Jessica Mclean is a 75 y.o. female.  Pt complains of feeling like the room is spinning.  Pt reports she gets dizzy when she turns her head side to side.  Pt reports difficulty walking due to dizziness.  No weakness no visual or hearing changes.    The history is provided by the patient. No language interpreter was used.  Weakness Severity:  Moderate Onset quality:  Gradual Timing:  Constant Progression:  Worsening Chronicity:  New Relieved by:  Nothing Worsened by:  Nothing Ineffective treatments:  None tried Associated symptoms: no abdominal pain, no dysuria, no fever, no foul-smelling urine, no frequency, no headaches, no nausea, no shortness of breath and no vomiting   Risk factors: diabetes       Past Medical History:  Diagnosis Date   Bleeding disorder ([place])    Cervical cancer ([place]) 2010   Diabetes mellitus without complication ([place])    type 2   Diabetes mellitus without complication ([place])    Heart murmur    High cholesterol    HLD (hyperlipidemia)    Hypertension    Mental disorder    depression; suicidal ideation in 2017   Sleep apnea    Vitamin D deficiency     Patient Active Problem List   Diagnosis Date Noted   Microcytic anemia 01/15/2021   Need for immunization against influenza 01/15/2021   Postprocedural female urethral stricture 11/27/2020   Idiopathic peripheral neuropathy 06/14/2020   Diabetic neuropathy ([place]) 06/14/2020   Gross hematuria 06/11/2020   Urge incontinence 03/08/2020   Essential hypertension 11/23/2019   Type 2 diabetes mellitus with hyperlipidemia ([place]) 11/23/2019   TIA (transient ischemic attack) 11/22/2019    Past Surgical History:  Procedure Laterality Date   BILATERAL OOPHORECTOMY     CESAREAN SECTION     x 4   CHOLECYSTECTOMY     COLOSTOMY     x2    CYSTOSCOPY WITH URETHRAL DILATATION N/A 11/15/2020   Procedure: CYSTOSCOPY WITH URETHRAL DILATION;  Surgeon: Cleon Gustin, MD;  Location: [facility];  Service: Urology;  Laterality: N/A;   SALPINGECTOMY     TONSILLECTOMY     TONSILLECTOMY AND ADENOIDECTOMY       OB History     Gravida  5   Para  0   Term  0   Preterm  0   AB  0   Living         SAB  0   IAB  0   Ectopic  0   Multiple      Live Births              Family History  Problem Relation Age of Onset   Cancer Father        kidney   Cancer Mother        lung   Other Brother        heart issues    Social History   Tobacco Use   Smoking status: Never   Smokeless tobacco: Never  Vaping Use   Vaping Use: Never used  Substance Use Topics   Alcohol use: Never   Drug use: Never    Home Medications Prior to Admission medications   Medication Sig Start Date End Date Taking? Authorizing Provider  meclizine (ANTIVERT) 25 MG tablet  Take 1 tablet (25 mg total) by mouth 3 (three) times daily as needed for dizziness. 03/23/21  Yes Caryl Ada K, PA-C  ACCU-CHEK AVIVA PLUS test strip  10/25/19   [provider]  Accu-Chek Softclix Lancets lancets SMARTSIG:Topical 12/15/19   [provider]  amLODipine (NORVASC) 10 MG tablet Take 1 tablet (10 mg total) by mouth daily. 01/15/21   Lindell Spar, MD  aspirin-acetaminophen-caffeine (EXCEDRIN EXTRA STRENGTH) [id number] MG tablet Take 2 tablets by mouth in the morning.    [provider]  atorvastatin (LIPITOR) 80 MG tablet Take 1 tablet (80 mg total) by mouth daily. 03/21/21   Lindell Spar, MD  Blood Glucose Monitoring Suppl (ACCU-CHEK GUIDE) w/Device KIT  01/21/20   [provider]  calcium-vitamin D (OSCAL-500) 500-400 MG-UNIT tablet Take 1 tablet by mouth daily.    [provider]  Continuous Blood Gluc Receiver (FREESTYLE LIBRE 2 READER) DEVI As directed 07/31/20   Lindell Spar, MD  Continuous Blood Gluc  Sensor (FREESTYLE LIBRE 2 SENSOR) MISC 1 Piece by Does not apply route every 14 (fourteen) days. 07/31/20   Lindell Spar, MD  desonide (DESOWEN) 0.05 % ointment Apply 1 application topically 2 (two) times daily. 03/05/21   Lindell Spar, MD  diphenhydramine-acetaminophen (TYLENOL PM) 25-500 MG TABS tablet Take 1 tablet by mouth at bedtime. Patient not taking: Reported on 02/26/2021    [provider]  HUMULIN R 100 UNIT/ML injection INJECT 10 UNITS  SUBCUTANEOUSLY 3 TIMES  DAILY BEFORE MEALS 11/27/20   Lindell Spar, MD  insulin glargine (LANTUS SOLOSTAR) 100 UNIT/ML Solostar Pen Inject 20 Units into the skin at bedtime. Patient not taking: Reported on 03/14/2021 01/15/21   Lindell Spar, MD  Insulin Pen Needle (INSUPEN PEN NEEDLES) 31G X 5 MM MISC 1 each by Does not apply route 4 (four) times daily -  before meals and at bedtime. 03/19/21   Lindell Spar, MD  Insulin Syringe-Needle U-100 (BD INSULIN SYRINGE U/F) 31G X 5/16" 0.3 ML MISC USE AS DIRECTED 06/29/20   Lindell Spar, MD  metFORMIN (GLUCOPHAGE) 1000 MG tablet TAKE 1 TABLET BY MOUTH  TWICE DAILY WITH MEALS 06/29/20   Lindell Spar, MD  omega-3 acid ethyl esters (LOVAZA) 1 g capsule Take 2 g by mouth daily.    [provider]  OZEMPIC, 0.25 OR 0.5 MG/DOSE, 2 MG/1.5ML SOPN INJECT SUBCUTANEOUSLY 0.5  MG EVERY WEEK Patient not taking: No sig reported 02/25/21   Lindell Spar, MD  phenazopyridine (PYRIDIUM) 100 MG tablet Take 1 tablet (100 mg total) by mouth 3 (three) times daily as needed for pain. 11/15/20   McKenzie, Candee Furbish, MD  polyethylene glycol powder (GLYCOLAX/MIRALAX) 17 GM/SCOOP powder Take 17 g by mouth daily as needed for mild constipation or moderate constipation.     [provider]  tamsulosin (FLOMAX) 0.4 MG CAPS capsule Take 1 capsule (0.4 mg total) by mouth daily after supper. Patient not taking: No sig reported 11/27/20   Cleon Gustin, MD  telmisartan (MICARDIS) 40 MG tablet TAKE 1  TABLET BY MOUTH  DAILY 03/18/21   Lindell Spar, MD  traMADol (ULTRAM) 50 MG tablet Take 1 tablet (50 mg total) by mouth every 6 (six) hours as needed. Patient not taking: No sig reported 11/15/20 11/15/21  Cleon Gustin, MD  glipiZIDE (GLUCOTROL) 5 MG tablet Take by mouth daily before breakfast.  10/01/19  [provider]    Allergies  Other  Review of Systems   Review of Systems  Constitutional:  Negative for fever.  Respiratory:  Negative for shortness of breath.   Gastrointestinal:  Negative for abdominal pain, nausea and vomiting.  Genitourinary:  Negative for dysuria and frequency.  Neurological:  Positive for weakness. Negative for headaches.  All other systems reviewed and are negative.  Physical Exam Updated Vital Signs BP (!) 142/72   Pulse 66   Temp 98 F (36.7 C) (Oral)   Resp 19   Ht 5' 4.25" (1.632 m)   Wt 72 kg   LMP  (LMP Unknown)   SpO2 93%   BMI 27.03 kg/m   Physical Exam Vitals and nursing note reviewed.  Constitutional:      Appearance: She is well-developed.  HENT:     Head: Normocephalic.     Right Ear: Tympanic membrane normal.     Nose: Nose normal.     Mouth/Throat:     Mouth: Mucous membranes are moist.  Eyes:     Extraocular Movements: Extraocular movements intact.     Pupils: Pupils are equal, round, and reactive to light.  Cardiovascular:     Rate and Rhythm: Normal rate.  Pulmonary:     Effort: Pulmonary effort is normal.  Abdominal:     General: Abdomen is flat. There is no distension.  Musculoskeletal:        General: Normal range of motion.     Cervical back: Normal range of motion.  Skin:    General: Skin is warm.  Neurological:     Mental Status: She is alert and oriented to person, place, and time.    ED Results / Procedures / Treatments   Labs (all labs ordered are listed, but only abnormal results are displayed) Labs Reviewed  CBC WITH DIFFERENTIAL/PLATELET - Abnormal; Notable for the following  components:      Result Value   RBC 3.76 (*)    Hemoglobin 9.2 (*)    HCT 30.0 (*)    MCV 79.8 (*)    MCH 24.5 (*)    RDW 17.1 (*)    All other components within normal limits  COMPREHENSIVE METABOLIC PANEL - Abnormal; Notable for the following components:   Glucose, Bld 173 (*)    Total Bilirubin 0.2 (*)    All other components within normal limits  URINALYSIS, ROUTINE W REFLEX MICROSCOPIC  TROPONIN I (HIGH SENSITIVITY)  TROPONIN I (HIGH SENSITIVITY)    EKG EKG Interpretation  Date/Time:  Saturday March 23 2021 20:40:12 EDT Ventricular Rate:  63 PR Interval:  58 QRS Duration: 135 QT Interval:  431 QTC Calculation: 442 R Axis:   69 Text Interpretation: Sinus rhythm Short PR interval Right bundle branch block new rbb from prior Confirmed by Isla Pence [phone]) on 03/23/2021 10:28:50 PM  Radiology CT HEAD WO CONTRAST (5MM)  Result Date: 03/23/2021 CLINICAL DATA:  Dizziness. EXAM: CT HEAD WITHOUT CONTRAST TECHNIQUE: Contiguous axial images were obtained from the base of the skull through the vertex without intravenous contrast. COMPARISON:  December 11, 2019 FINDINGS: Brain: No subdural, epidural, or subarachnoid hemorrhage. Cerebellum, brainstem, and basal cisterns are normal. No mass effect or midline shift. Ventricles and sulci are unremarkable. Mild white matter changes. No acute cortical ischemia or infarct. Vascular: Calcified atherosclerosis in the intracranial carotids. Skull: Normal. Negative for fracture or focal lesion. Sinuses/Orbits: No acute finding. Other: No other abnormalities are identified. IMPRESSION: 1. Mild white matter changes. No acute intracranial abnormalities. No cause for the patient's symptoms  identified. Electronically Signed   By: Dorise Bullion III M.D.   On: 03/23/2021 21:05   DG Chest Port 1 View  Result Date: 03/23/2021 CLINICAL DATA:  Increased dizziness.  Recent falls. EXAM: PORTABLE CHEST 1 VIEW COMPARISON:  December 11, 2019 FINDINGS: The heart  size and mediastinal contours are within normal limits. Both lungs are clear. The visualized skeletal structures are unremarkable. IMPRESSION: No active disease. Electronically Signed   By: Dorise Bullion III M.D.   On: 03/23/2021 21:02    Procedures Procedures   Medications Ordered in ED Medications  meclizine (ANTIVERT) tablet 25 mg (has no administration in time range)  meclizine (ANTIVERT) tablet 25 mg (25 mg Oral Given 03/23/21 2135)  sodium chloride 0.9 % bolus 500 mL (500 mLs Intravenous New Bag/Given 03/23/21 2206)    ED Course  I have reviewed the triage vital signs and the nursing notes.  Pertinent labs & imaging results that were available during my care of the patient were reviewed by me and considered in my medical decision making (see chart for details).    MDM Rules/Calculators/A&P                           MDM:  Dr. Gilford Raid in to see.  Ct head no acute EKG  no acute.  Labs reviewed and are at baseline.  Pt given iv ns x500cc and antivert 25 mg.  Pt reports symptoms resolving.  Pt able to stand and ambulate.  Pt given rx for vertigo  Final Clinical Impression(s) / ED Diagnoses Final diagnoses:  Vertigo    Rx / DC Orders ED Discharge Orders          Ordered    meclizine (ANTIVERT) 25 MG tablet  3 times daily PRN        03/23/21 2317          An After Visit Summary was printed and given to the patient.    Fransico Meadow, [place] 03/24/21 1552    Isla Pence, MD 03/24/21 1714

## 2021-03-23 NOTE — ED Triage Notes (Signed)
Pt arrived via [facility] c/o increased dizziness and recently falls. Pt reporting right arm pain, right knee pain. Pt reports room spinning while lying still. EDP at bedside during Triage.

## 2021-03-23 NOTE — Discharge Instructions (Addendum)
Drink plenty of water.  See your Physician for recheck next week

## 2021-03-23 NOTE — ED Notes (Signed)
Patient transported to CT 

## 2021-03-24 NOTE — Progress Notes (Deleted)
Referring Provider: Lindell Spar, MD Primary Care Physician:  Lindell Spar, MD Primary Gastroenterologist:  Dr. Rayne Du chief complaint on file.   HPI:   Jessica Mclean is a 75 y.o. female presenting today at the request of Lindell Spar, MD for colon cancer screening.   Today:    Per chart review, labs revealed microcytic anemia. Anemia dates back at least to July 2021, initial hemoglobin in the 10-11 range, down to 9.0 in August 2022.  Most recent blood work 03/23/2021 with hemoglobin 9.2.  Kidney function within normal limits.   Had been having gross hematuria, found to have urethral stricture as well as numerous torturous vessels in the bladder lumen consistent with prior radiation therapy on cystoscopy s/p urethral dilation in June 2022.    CT A/P with contrast July 2021 with thickened appearance of ascending colon, likely due to under distention, focal area of tethering of small bowel loops in the pelvis consistent with adhesions.    Past Medical History:  Diagnosis Date   Bleeding disorder ([place])    Cervical cancer ([place]) 2010   Diabetes mellitus without complication ([place])    type 2   Diabetes mellitus without complication ([place])    Heart murmur    High cholesterol    HLD (hyperlipidemia)    Hypertension    Mental disorder    depression; suicidal ideation in 2017   Sleep apnea    Vitamin D deficiency     Past Surgical History:  Procedure Laterality Date   BILATERAL OOPHORECTOMY     CESAREAN SECTION     x 4   CHOLECYSTECTOMY     COLOSTOMY     x2   CYSTOSCOPY WITH URETHRAL DILATATION N/A 11/15/2020   Procedure: CYSTOSCOPY WITH URETHRAL DILATION;  Surgeon: Cleon Gustin, MD;  Location: [facility];  Service: Urology;  Laterality: N/A;   SALPINGECTOMY     TONSILLECTOMY     TONSILLECTOMY AND ADENOIDECTOMY      Current Outpatient Medications  Medication Sig Dispense Refill   ACCU-CHEK AVIVA PLUS test strip      Accu-Chek Softclix Lancets lancets  SMARTSIG:Topical     amLODipine (NORVASC) 10 MG tablet Take 1 tablet (10 mg total) by mouth daily. 90 tablet 3   aspirin-acetaminophen-caffeine (EXCEDRIN EXTRA STRENGTH) 250-250-65 MG tablet Take 2 tablets by mouth in the morning.     atorvastatin (LIPITOR) 80 MG tablet Take 1 tablet (80 mg total) by mouth daily. 90 tablet 3   Blood Glucose Monitoring Suppl (ACCU-CHEK GUIDE) w/Device KIT      calcium-vitamin D (OSCAL-500) 500-400 MG-UNIT tablet Take 1 tablet by mouth daily.     Continuous Blood Gluc Receiver (FREESTYLE LIBRE 2 READER) DEVI As directed 1 each 0   Continuous Blood Gluc Sensor (FREESTYLE LIBRE 2 SENSOR) MISC 1 Piece by Does not apply route every 14 (fourteen) days. 2 each 3   desonide (DESOWEN) 0.05 % ointment Apply 1 application topically 2 (two) times daily. 60 g 1   diphenhydramine-acetaminophen (TYLENOL PM) 25-500 MG TABS tablet Take 1 tablet by mouth at bedtime. (Patient not taking: Reported on 02/26/2021)     HUMULIN R 100 UNIT/ML injection INJECT 10 UNITS  SUBCUTANEOUSLY 3 TIMES  DAILY BEFORE MEALS 30 mL 3   insulin glargine (LANTUS SOLOSTAR) 100 UNIT/ML Solostar Pen Inject 20 Units into the skin at bedtime. (Patient not taking: Reported on 03/14/2021) 15 mL 1   Insulin Pen Needle (INSUPEN PEN NEEDLES) 31G X 5 MM MISC  1 each by Does not apply route 4 (four) times daily -  before meals and at bedtime. 100 each 1   Insulin Syringe-Needle U-100 (BD INSULIN SYRINGE U/F) 31G X 5/16" 0.3 ML MISC USE AS DIRECTED 100 each 0   meclizine (ANTIVERT) 25 MG tablet Take 1 tablet (25 mg total) by mouth 3 (three) times daily as needed for dizziness. 30 tablet 0   metFORMIN (GLUCOPHAGE) 1000 MG tablet TAKE 1 TABLET BY MOUTH  TWICE DAILY WITH MEALS 180 tablet 3   omega-3 acid ethyl esters (LOVAZA) 1 g capsule Take 2 g by mouth daily.     OZEMPIC, 0.25 OR 0.5 MG/DOSE, 2 MG/1.5ML SOPN INJECT SUBCUTANEOUSLY 0.5  MG EVERY WEEK (Patient not taking: No sig reported) 3 mL 5   phenazopyridine (PYRIDIUM)  100 MG tablet Take 1 tablet (100 mg total) by mouth 3 (three) times daily as needed for pain. 15 tablet 0   polyethylene glycol powder (GLYCOLAX/MIRALAX) 17 GM/SCOOP powder Take 17 g by mouth daily as needed for mild constipation or moderate constipation.      tamsulosin (FLOMAX) 0.4 MG CAPS capsule Take 1 capsule (0.4 mg total) by mouth daily after supper. (Patient not taking: No sig reported) 30 capsule 11   telmisartan (MICARDIS) 40 MG tablet TAKE 1 TABLET BY MOUTH  DAILY 90 tablet 3   traMADol (ULTRAM) 50 MG tablet Take 1 tablet (50 mg total) by mouth every 6 (six) hours as needed. (Patient not taking: No sig reported) 15 tablet 0   No current facility-administered medications for this visit.    Allergies as of 03/25/2021 - Review Complete 03/14/2021  Allergen Reaction Noted   Other  11/06/2016    Family History  Problem Relation Age of Onset   Cancer Father        kidney   Cancer Mother        lung   Other Brother        heart issues    Social History   Socioeconomic History   Marital status: Divorced    Spouse name: Not on file   Number of children: 3   Years of education: Not on file   Highest education level: Not on file  Occupational History   Occupation: retired  Tobacco Use   Smoking status: Never   Smokeless tobacco: Never  Vaping Use   Vaping Use: Never used  Substance and Sexual Activity   Alcohol use: Never   Drug use: Never   Sexual activity: Not Currently    Birth control/protection: Post-menopausal  Other Topics Concern   Not on file  Social History Narrative   ** Merged History Encounter **       Social Determinants of Health   Financial Resource Strain: Medium Risk   Difficulty of Paying Living Expenses: Somewhat hard  Food Insecurity: Landscape architect Present   Worried About Charity fundraiser in the Last Year: Sometimes true   [place] in the Last Year: Sometimes true  Transportation Needs: No Transportation Needs   Lack of  Transportation (Medical): No   Lack of Transportation (Non-Medical): No  Physical Activity: Insufficiently Active   Days of Exercise per Week: 4 days   Minutes of Exercise per Session: 30 min  Stress: No Stress Concern Present   Feeling of Stress : Only a little  Social Connections: Socially Isolated   Frequency of Communication with Friends and Family: More than three times a week   Frequency of Social Gatherings  with Friends and Family: Twice a week   Attends Religious Services: Never   Marine scientist or Organizations: No   Attends Archivist Meetings: Never   Marital Status: Widowed  Intimate Partner Violence: Not on file    Review of Systems: Gen: Denies any fever, chills, fatigue, weight loss, lack of appetite.  CV: Denies chest pain, heart palpitations, peripheral edema, syncope.  Resp: Denies shortness of breath at rest or with exertion. Denies wheezing or cough.  GI: Denies dysphagia or odynophagia. Denies jaundice, hematemesis, fecal incontinence. GU : Denies urinary burning, urinary frequency, urinary hesitancy MS: Denies joint pain, muscle weakness, cramps, or limitation of movement.  Derm: Denies rash, itching, dry skin Psych: Denies depression, anxiety, memory loss, and confusion Heme: Denies bruising, bleeding, and enlarged lymph nodes.  Physical Exam: LMP  (LMP Unknown)  General:   Alert and oriented. Pleasant and cooperative. Well-nourished and well-developed.  Head:  Normocephalic and atraumatic. Eyes:  Without icterus, sclera clear and conjunctiva pink.  Ears:  Normal auditory acuity. Nose:  No deformity, discharge,  or lesions. Mouth:  No deformity or lesions, oral mucosa pink.  Neck:  Supple, without mass or thyromegaly. Lungs:  Clear to auscultation bilaterally. No wheezes, rales, or rhonchi. No distress.  Heart:  S1, S2 present without murmurs appreciated.  Abdomen:  +BS, soft, non-tender and non-distended. No HSM noted. No guarding or  rebound. No masses appreciated.  Rectal:  Deferred  Msk:  Symmetrical without gross deformities. Normal posture. Pulses:  Normal pulses noted. Extremities:  Without clubbing or edema. Neurologic:  Alert and  oriented x4;  grossly normal neurologically. Skin:  Intact without significant lesions or rashes. Cervical Nodes:  No significant cervical adenopathy. Psych:  Alert and cooperative. Normal mood and affect.

## 2021-03-25 ENCOUNTER — Ambulatory Visit: Payer: Medicare Other | Admitting: Gastroenterology

## 2021-03-25 ENCOUNTER — Other Ambulatory Visit: Payer: Self-pay | Admitting: *Deleted

## 2021-03-25 DIAGNOSIS — E785 Hyperlipidemia, unspecified: Secondary | ICD-10-CM

## 2021-03-25 DIAGNOSIS — E1169 Type 2 diabetes mellitus with other specified complication: Secondary | ICD-10-CM

## 2021-03-25 MED ORDER — "INSULIN SYRINGE-NEEDLE U-100 31G X 5/16"" 0.3 ML MISC": 0 refills | Status: DC

## 2021-03-27 ENCOUNTER — Telehealth: Payer: Medicare Other

## 2021-03-28 ENCOUNTER — Ambulatory Visit (HOSPITAL_COMMUNITY): Payer: Medicare Other

## 2021-04-03 ENCOUNTER — Encounter (HOSPITAL_COMMUNITY): Payer: Self-pay

## 2021-04-03 ENCOUNTER — Ambulatory Visit (HOSPITAL_COMMUNITY): Payer: Medicare Other

## 2021-04-04 ENCOUNTER — Telehealth: Payer: Self-pay

## 2021-04-04 ENCOUNTER — Ambulatory Visit (INDEPENDENT_AMBULATORY_CARE_PROVIDER_SITE_OTHER): Payer: Medicare Other | Admitting: Pharmacist

## 2021-04-04 ENCOUNTER — Telehealth: Payer: Self-pay | Admitting: Internal Medicine

## 2021-04-04 DIAGNOSIS — E785 Hyperlipidemia, unspecified: Secondary | ICD-10-CM

## 2021-04-04 DIAGNOSIS — E1169 Type 2 diabetes mellitus with other specified complication: Secondary | ICD-10-CM

## 2021-04-04 DIAGNOSIS — I10 Essential (primary) hypertension: Secondary | ICD-10-CM

## 2021-04-04 NOTE — Chronic Care Management (AMB) (Signed)
Chronic Care Management Pharmacy Note  04/04/2021 Name:  Jessica Mclean MRN:  [id number] DOB:  May 10, 1946  Summary:  Other Thorough medication reconciliation completed today via telephone. Patient did not want to have to bring in all of her medications as she walks to clinic.  Will begin to focus more on hypertension and hyperlipidemia at next visit now that blood glucose is improving  Type 2 Diabetes Patient only using Humulin R if blood glucose >180 but this has been infrequent Patient instructed to continue current regimen as above since her blood glucose has improved significantly without hypoglycemia. Can continue to titrate Ozempic at future visits. Would recommend to discontinue Humulin R eventually.  Patient's blood glucose appears to have improved since using the [facility] 2 (see report below). Average blood glucose over last 2 weeks is 154 with estimated A1c ~7.0%.      Hypertension  Continue telmisartan 40 mg by mouth once daily and amlodipine 10 mg by mouth once daily for now. Will discuss blood pressure management further at next office visit.  Hyperlipidemia/History of TIA Continue atorvastatin 80 mg by mouth once daily Consider addition of ezetimibe 10 mg by mouth daily. Will discuss more at next visit. Re-check lipid panel in 4-12 weeks  Subjective: Jessica Mclean is an 75 y.o. year old female who is a primary patient of Lindell Spar, MD.  The CCM team was consulted for assistance with disease management and care coordination needs.    Engaged with patient by telephone for follow up visit in response to provider referral for pharmacy case management and/or care coordination services.   Consent to Services:  The patient was given information about Chronic Care Management services, agreed to services, and gave verbal consent prior to initiation of services.  Please see initial visit note for detailed documentation.   Patient Care Team: Lindell Spar,  MD as PCP - General (Internal Medicine) Jani Gravel, MD (Internal Medicine) Beryle Lathe, [facility] (Pharmacist)  Objective:  Lab Results  Component Value Date   CREATININE 0.83 03/23/2021   CREATININE 0.83 01/09/2021   CREATININE 0.79 11/12/2020    Lab Results  Component Value Date   HGBA1C 9.2 (H) 01/09/2021   Last diabetic Eye exam:  Lab Results  Component Value Date/Time   HMDIABEYEEXA No Retinopathy 06/26/2020 12:00 AM    Last diabetic Foot exam: No results found for: HMDIABFOOTEX      Component Value Date/Time   CHOL 245 (H) 01/09/2021 1421   TRIG 296 (H) 01/09/2021 1421   HDL 40 01/09/2021 1421   CHOLHDL 6.1 (H) 01/09/2021 1421   CHOLHDL 5.7 11/23/2019 1504   VLDL 58 (H) 11/23/2019 1504   LDLCALC 150 (H) 01/09/2021 1421    Hepatic Function Latest Ref Rng & Units 03/23/2021 01/09/2021 12/11/2019  Total Protein 6.5 - 8.1 g/dL 7.6 6.9 6.8  Albumin 3.5 - 5.0 g/dL 3.9 4.4 3.7  AST 15 - 41 U/L _0 ALT 0 - 44 U/L _1 Alk Phosphatase 38 - 126 U/L 75 92 72  Total Bilirubin 0.3 - 1.2 mg/dL 0.2(L) 0.4 0.7    Lab Results  Component Value Date/Time   TSH 1.300 01/09/2021 02:21 PM    CBC Latest Ref Rng & Units 03/23/2021 01/09/2021 12/11/2019  WBC 4.0 - 10.5 K/uL 6.0 6.6 -  Hemoglobin 12.0 - 15.0 g/dL 9.2(L) 9.0(L) 11.6(L)  Hematocrit 36.0 - 46.0 % 30.0(L) 28.1(L) 34.0(L)  Platelets 150 - 400 K/uL 333 353 -  No results found for: VD25OH  Clinical ASCVD: No  The 10-year ASCVD risk score (Arnett DK, et al., 2019) is: 43.6%   Values used to calculate the score:     Age: 39 years     Sex: Female     Is Non-Hispanic African American: No     Diabetic: Yes     Tobacco smoker: No     Systolic Blood Pressure: 096 mmHg     Is BP treated: Yes     HDL Cholesterol: 40 mg/dL     Total Cholesterol: 245 mg/dL    Social History   Tobacco Use  Smoking Status Never  Smokeless Tobacco Never   BP Readings from Last 3 Encounters:  03/23/21 (!) 142/72   03/14/21 (!) 153/71  02/26/21 (!) 149/62   Pulse Readings from Last 3 Encounters:  03/23/21 66  03/14/21 79  02/26/21 80   Wt Readings from Last 3 Encounters:  03/23/21 158 lb 11.7 oz (72 kg)  02/26/21 159 lb 6.4 oz (72.3 kg)  01/15/21 160 lb 1.9 oz (72.6 kg)    Assessment: Review of patient past medical history, allergies, medications, health status, including review of consultants reports, laboratory and other test data, was performed as part of comprehensive evaluation and provision of chronic care management services.   SDOH:  (Social Determinants of Health) assessments and interventions performed:    CCM Care Plan  Allergies  Allergen Reactions   Other     Nickel staples   Clindamycin Phosphate Nausea And Vomiting    Medications Reviewed Today     Reviewed by Beryle Lathe, [facility] (Pharmacist) on 04/04/21 at 1348  Med List Status: <None>   Medication Order Taking? Sig Documenting Provider Last Dose Status Informant  ACCU-CHEK AVIVA PLUS test strip [id number] No  [provider] Unknown Active Self  Accu-Chek Softclix Lancets lancets [id number] No SMARTSIG:Topical [provider] Unknown Active Self  amLODipine (NORVASC) 10 MG tablet [id number] Yes Take 1 tablet (10 mg total) by mouth daily. Lindell Spar, MD Taking Active   aspirin-acetaminophen-caffeine [facility] EXTRA STRENGTH) [phone] MG tablet [id number] Yes Take 2 tablets by mouth in the morning. [provider] Taking Active Self  atorvastatin (LIPITOR) 80 MG tablet [id number] Yes Take 1 tablet (80 mg total) by mouth daily. Lindell Spar, MD Taking Active   Blood Glucose Monitoring Suppl (ACCU-CHEK GUIDE) w/Device Drucie Opitz [id number] No  [provider] Unknown Active Self  calcium-vitamin D (OSCAL-500) 500-400 MG-UNIT tablet [id number] Yes Take 1 tablet by mouth daily. [provider] Taking Active Self  Continuous Blood Gluc Receiver (FREESTYLE LIBRE 2 READER) DEVI  [id number] Yes As directed Lindell Spar, MD Taking Active Self  Continuous Blood Gluc Sensor (FREESTYLE LIBRE 2 SENSOR) MISC [id number] Yes 1 Piece by Does not apply route every 14 (fourteen) days. Lindell Spar, MD Taking Active Self  desonide (DESOWEN) 0.05 % ointment [id number] Yes Apply 1 application topically 2 (two) times daily. Lindell Spar, MD Taking Active   Discontinued 10/01/19 1034   glucosamine-chondroitin 500-400 MG tablet [id number] Yes Take 2 tablets by mouth daily. [provider] Taking Active   HUMULIN R 100 UNIT/ML injection [id number] Yes INJECT 10 UNITS  SUBCUTANEOUSLY 3 TIMES  DAILY BEFORE MEALS Lindell Spar, MD Taking Active            Med Note Jim Like Apr 04, 2021  1:29 PM) Using 5 units only when blood glucose >180  insulin glargine (  LANTUS SOLOSTAR) 100 UNIT/ML Solostar Pen [id number] Yes Inject 20 Units into the skin at bedtime. Lindell Spar, MD Taking Active   Insulin Pen Needle (INSUPEN PEN NEEDLES) 31G X 5 MM MISC [id number] No 1 each by Does not apply route 4 (four) times daily -  before meals and at bedtime. Lindell Spar, MD Unknown Active   Insulin Syringe-Needle U-100 (BD INSULIN SYRINGE U/F) 31G X 5/16" 0.3 ML MISC [id number] No USE AS DIRECTED Lindell Spar, MD Unknown Active   meclizine (ANTIVERT) 25 MG tablet [id number] Yes Take 1 tablet (25 mg total) by mouth 3 (three) times daily as needed for dizziness. Fransico Meadow, [place] Taking Active   metFORMIN (GLUCOPHAGE) 1000 MG tablet [id number] Yes TAKE 1 TABLET BY MOUTH  TWICE DAILY WITH MEALS Lindell Spar, MD Taking Active   Multiple Vitamins-Minerals (PRESERVISION AREDS PO) [id number] Yes Take 2 capsules by mouth daily. [provider] Taking Active Self  omega-3 acid ethyl esters (LOVAZA) 1 g capsule [id number] Yes Take 2 g by mouth 2 (two) times daily. [provider] Taking Active Self  OZEMPIC, 0.25 OR 0.5 MG/DOSE, 2 MG/1.5ML SOPN [id number] Yes INJECT  SUBCUTANEOUSLY 0.5  MG EVERY WEEK [name], Colin Broach, MD Taking Active   phenazopyridine (PYRIDIUM) 100 MG tablet [id number] Yes Take 1 tablet (100 mg total) by mouth 3 (three) times daily as needed for pain. McKenzie, Candee Furbish, MD Taking Active   polyethylene glycol powder [facility]) 17 GM/SCOOP powder [id number] Yes Take 17 g by mouth daily as needed for mild constipation or moderate constipation.  [provider] Taking Active Self  tamsulosin (FLOMAX) 0.4 MG CAPS capsule [id number] No Take 1 capsule (0.4 mg total) by mouth daily after supper.  Patient not taking: Reported on 02/26/2021   Cleon Gustin, MD Not Taking Active   telmisartan (MICARDIS) 40 MG tablet [id number] Yes TAKE 1 TABLET BY MOUTH  DAILY Lindell Spar, MD Taking Active   valACYclovir (VALTREX) 1000 MG tablet [id number] Yes Take 1 g by mouth 2 (two) times daily as needed. [provider] Taking Active            Med Note Jim Like Apr 04, 2021  1:41 PM) Only for outbreak 3-4 times per year            Patient Active Problem List   Diagnosis Date Noted   Microcytic anemia 01/15/2021   Need for immunization against influenza 01/15/2021   Postprocedural female urethral stricture 11/27/2020   Idiopathic peripheral neuropathy 06/14/2020   Diabetic neuropathy ([place]) 06/14/2020   Gross hematuria 06/11/2020   Urge incontinence 03/08/2020   Essential hypertension 11/23/2019   Type 2 diabetes mellitus with hyperlipidemia ([place]) 11/23/2019   TIA (transient ischemic attack) 11/22/2019    Immunization History  Administered Date(s) Administered   Fluad Quad(high Dose 65+) 03/06/2020, 01/15/2021   Moderna Sars-Covid-2 Vaccination 05/31/2019, 07/01/2019, 04/05/2020   Zoster Recombinat (Shingrix) 03/10/2018, 06/22/2018    Conditions to be addressed/monitored: HTN, HLD, and DMII  Care Plan : Medication Management  Updates made by Beryle Lathe, [place] since 04/04/2021 12:00  AM     Problem: HTN, T2DM, HLD   Priority: High  Onset Date: 03/14/2021     Long-Range Goal: Disease Progression Prevention   Start Date: 03/14/2021  Expected End Date: 06/12/2021  Recent Progress: On track  Priority: High  Note:   Current Barriers:  Unable to independently monitor therapeutic efficacy Unable to achieve control of  diabetes, hypertension, and hyperlipidemia  Pharmacist Clinical Goal(s):  Patient will achieve adherence to monitoring guidelines and medication adherence to achieve therapeutic efficacy achieve control of diabetes, hypertension, and hyperlipidemia as evidenced by improved fasting blood sugar, improved A1c, improved blood pressure control, improved LDL, and improved triglycerides through collaboration with PharmD and provider.   Interventions: 1:1 collaboration with Lindell Spar, MD regarding development and update of comprehensive plan of care as evidenced by provider attestation and co-signature Inter-disciplinary care team collaboration (see longitudinal plan of care) Comprehensive medication review performed; medication list updated in electronic medical record  Type 2 Diabetes - Goal on Track (progressing): YES.: Improving ; Most recent A1c above goal of <7% per ADA guidelines Current medications: metformin 1,000 mg by mouth twice daily, Lantus 20 units at bedtime, Ozempic 0.5 mg subcutaneously once weekly, and Humulin R (U-100) three time daily as needed Intolerances: none Taking medications as directed: no, patient only using Humulin R if blood glucose >180 but this has been infrequent Side effects thought to be attributed to current medication regimen: no Hypoglycemia prevention: not indicated at this time Current meal patterns:  patient reports she is a vegetarian; has decreased Pepsi intake Current exercise:  currently not able to exercise much due to dizziness On a statin: yes On aspirin 81 mg daily: no Last microalbumin: unknown; on an  ACEi/ARB: yes Last eye exam: completed within last year Last foot exam: completed within last year Pneumonia vaccine: unknown Influenza vaccine: up to date Shingles: up to date Current glucose readings:  Patient was previously instructed appropriate technique for injection of Ozempic and Lantus in the office.  Patient instructed to continue current regimen as above since her blood glucose has improved significantly without hypoglycemia. Can continue to titrate Ozempic at future visits. Would recommend to discontinue Humulin R eventually.  Patient's blood glucose appears to have improved since using the [facility] 2 (see report below). Average blood glucose over last 2 weeks is 154 with estimated A1c ~7.0%.  Instructed to monitor blood sugars continuously with continuous glucose monitor  Encouraged regular aerobic exercise with a goal of 30 minutes five times per week (150 minutes per week) Discussed management of hypoglycemia. If blood sugar <70 at any time, treat with simple sugar such as 1/2 cup juice or regular soda or 3-4 glucose tablets. Recheck blood sugar in 15 minutes and repeat if blood sugar remains <70.      Hypertension - Condition stable. Not addressed this visit.: Blood pressure under poor control. Blood pressure is above goal of <130/80 mmHg per 2017 AHA/ACC guidelines. Current medications: telmisartan 40 mg by mouth once daily and amlodipine 10 mg by mouth once daily Intolerances: none Taking medications as directed: yes Side effects thought to be attributed to current medication regimen: no Current home blood pressure: not discussed today Continue telmisartan 40 mg by mouth once daily and amlodipine 10 mg by mouth once daily for now. Will discuss blood pressure management further at next office visit. Encourage dietary sodium restriction/DASH diet Recommend regular aerobic exercise Recommend home blood pressure monitoring to discuss at next visit Discussed need for  medication compliance  Hyperlipidemia/History of TIA - Condition stable. Not addressed this visit.: Uncontrolled. LDL above goal of <70 due to very high risk given established clinical ASCVD per 2020 AACE/ACE guidelines. Triglycerides above goal of <150 per 2020 AACE/ACE guidelines. Current medications: atorvastatin 80 mg by mouth once daily Intolerances: none Taking medications as directed: yes Side effects thought to be attributed to current  medication regimen: no Continue atorvastatin 80 mg by mouth once daily Consider addition of ezetimibe 10 mg by mouth daily. Will discuss more at next visit. Encourage dietary reduction of high fat containing foods such as butter, nuts, bacon, egg yolks, etc. Recommend regular aerobic exercise Reviewed risks of hyperlipidemia, principles of treatment and consequences of untreated hyperlipidemia Discussed need for medication compliance Re-check lipid panel in 4-12 weeks  Patient Goals/Self-Care Activities Patient will:  Focus on medication adherence by keeping up with prescription refills and either using a pill box or reminders to take your medications at the prescribed times Check blood sugar continuously with continuous glucose monitor, document, and provide at future appointments Check blood pressure at least once daily, document, and provide at future appointments Target a minimum of 150 minutes of moderate intensity exercise weekly Engage in dietary modifications by decreased fat intake and fewer sweetened foods & beverages  Follow Up Plan: Face to Face appointment with care management team member scheduled for: 04/24/21      Medication Assistance: None required.  Patient affirms current coverage meets needs.  Patient's preferred pharmacy is:  [id number] - [place], [place] - [address] AT [place]. [place] [address] Phone: [phone] Fax: [phone]  [id number] [place] [id number]: [phone]: [phone] [facility] ([facility] [facility]Phone: [phone]: [phone] [facility] ([facility]) - [address] [address]Phone: [phone]: [phone] Follow Up:  Patient agrees to Care Plan and Follow-up.  Plan: Face to Face appointment with care management team member scheduled for: 04/24/21  CKennon Holter PharmD Clinical Pharmacist [facility] [phone]

## 2021-04-04 NOTE — Telephone Encounter (Signed)
Patient called to give correct spelling of medicine Tamsulosin, she said no need to speak to nurse just give spelling of medicine.

## 2021-04-04 NOTE — Patient Instructions (Signed)
Jessica Mclean,  It was great to talk to you today!  Please call me with any questions or concerns.   Visit Information  Thank you for taking time to visit with me today. Please don't hesitate to contact me if I can be of assistance to you before our next scheduled telephone appointment.  Face to Face appointment with care management team member scheduled for: 04/24/21  If you need to cancel or re-schedule our visit, please call [phone] and our care guide team will be happy to assist you.  Following is a list of the goals we discussed today:  Patient Goals/Self-Care Activities Patient will:  Focus on medication adherence by keeping up with prescription refills and either using a pill box or reminders to take your medications at the prescribed times Check blood sugar continuously with continuous glucose monitor, document, and provide at future appointments Check blood pressure at least once daily, document, and provide at future appointments Target a minimum of 150 minutes of moderate intensity exercise weekly Engage in dietary modifications by decreased fat intake and fewer sweetened foods & beverages  The patient verbalized understanding of instructions, educational materials, and care plan provided today and declined offer to receive copy of patient instructions, educational materials, and care plan.   Kennon Holter, PharmD Clinical Pharmacist [facility] [phone]

## 2021-04-04 NOTE — Telephone Encounter (Signed)
Pt called in about med review she had with Pharm D (chris )   Pt wants to discuss   Pt wants to switch ALL MEDS to [facility]   Elenor Legato is doing wonders for pt as well.  Wants to make sure dr is checking readings.

## 2021-04-04 NOTE — Telephone Encounter (Signed)
Pt said she spoke to Jessica Mclean and was told to get [facility] to send prescription of [facility]ertraline to [facility] please advise as this is not on her medlist.

## 2021-04-17 DIAGNOSIS — I10 Essential (primary) hypertension: Secondary | ICD-10-CM | POA: Diagnosis not present

## 2021-04-17 DIAGNOSIS — E785 Hyperlipidemia, unspecified: Secondary | ICD-10-CM | POA: Diagnosis not present

## 2021-04-17 DIAGNOSIS — E1169 Type 2 diabetes mellitus with other specified complication: Secondary | ICD-10-CM | POA: Diagnosis not present

## 2021-04-23 DIAGNOSIS — E1165 Type 2 diabetes mellitus with hyperglycemia: Secondary | ICD-10-CM | POA: Diagnosis not present

## 2021-04-24 ENCOUNTER — Other Ambulatory Visit: Payer: Self-pay | Admitting: Internal Medicine

## 2021-04-24 ENCOUNTER — Ambulatory Visit: Payer: Medicare Other | Admitting: Pharmacist

## 2021-04-24 ENCOUNTER — Other Ambulatory Visit: Payer: Self-pay

## 2021-04-24 VITALS — BP 119/65

## 2021-04-24 DIAGNOSIS — N3000 Acute cystitis without hematuria: Secondary | ICD-10-CM

## 2021-04-24 DIAGNOSIS — I10 Essential (primary) hypertension: Secondary | ICD-10-CM

## 2021-04-24 DIAGNOSIS — E1169 Type 2 diabetes mellitus with other specified complication: Secondary | ICD-10-CM

## 2021-04-24 DIAGNOSIS — E785 Hyperlipidemia, unspecified: Secondary | ICD-10-CM

## 2021-04-24 MED ORDER — NITROFURANTOIN MONOHYD MACRO 100 MG PO CAPS: 100.0000 mg | ORAL_CAPSULE | Freq: Two times a day (BID) | ORAL | 0 refills | Status: DC

## 2021-04-24 MED ORDER — PHENAZOPYRIDINE HCL 100 MG PO TABS: 100.0000 mg | ORAL_TABLET | Freq: Three times a day (TID) | ORAL | 0 refills | Status: DC | PRN

## 2021-04-24 NOTE — Chronic Care Management (AMB) (Signed)
Chronic Care Management Pharmacy Note  04/24/2021 Name:  Jessica Mclean MRN:  [id number] DOB:  10/14/1945  Summary:  General Refills requested by patient for nitrofurantoin and pyridium discussed face-to-face with PCP. PCP sent in refills to mail order pharmacy.   Type 2 Diabetes Improving per continuous glucose monitor ; Most recent A1c above goal of <7% per ADA guidelines Current medications: metformin 1,000 mg by mouth twice daily, Lantus 20 units at bedtime, Ozempic 0.5 mg subcutaneously once weekly, and Humulin R (U-100) up to three time daily as needed Taking medications as directed: no, patient reports often missing weekly dose of Ozempic. Dicussed strategies to prevent this in the future.  Current glucose readings: see report below. Patient reports she is almost out of her [facility] and needs to order refill. Patient reports Freestyle Libre malfunction recently which is who % time CGM is active is lower than normal. Patient instructed to continue current regimen as above since her blood glucose has improved significantly without hypoglycemia. Can continue to titrate Ozempic at future visits. Would recommend to discontinue Humulin R eventually.  Patient's blood glucose appears to have improved since using the [facility] 2 (see report below). Average blood glucose over last 2 weeks is 145 with estimated A1c ~6.9%.  Repeat A1c at next PCP visit     Hypertension  Blood pressure under good control. Blood pressure is at goal of <130/80 mmHg per 2017 AHA/ACC guidelines. Continue telmisartan 40 mg by mouth once daily and amlodipine 10 mg by mouth once daily.  Hyperlipidemia/History of TIA Uncontrolled. LDL above goal of <70 due to very high risk given established clinical ASCVD per 2020 AACE/ACE guidelines. Triglycerides above goal of <150 per 2020 AACE/ACE guidelines. Continue atorvastatin 80 mg by mouth once daily and Lovaza 2g by mouth twice daily Re-check lipid panel at  next PCP visit. If LDL remains above goal, will consider addition of ezetimibe 10 mg by mouth daily  Subjective: Jessica Mclean is an 75 y.o. year old female who is a primary patient of Lindell Spar, MD.  The CCM team was consulted for assistance with disease management and care coordination needs.    Engaged with patient face to face for follow up visit in response to provider referral for pharmacy case management and/or care coordination services.   Consent to Services:  The patient was given information about Chronic Care Management services, agreed to services, and gave verbal consent prior to initiation of services.  Please see initial visit note for detailed documentation.   Patient Care Team: Lindell Spar, MD as PCP - General (Internal Medicine) Jani Gravel, MD (Internal Medicine) Beryle Lathe, [facility] (Pharmacist)  Objective:  Lab Results  Component Value Date   CREATININE 0.83 03/23/2021   CREATININE 0.83 01/09/2021   CREATININE 0.79 11/12/2020    Lab Results  Component Value Date   HGBA1C 9.2 (H) 01/09/2021   Last diabetic Eye exam:  Lab Results  Component Value Date/Time   HMDIABEYEEXA No Retinopathy 06/26/2020 12:00 AM    Last diabetic Foot exam: No results found for: HMDIABFOOTEX      Component Value Date/Time   CHOL 245 (H) 01/09/2021 1421   TRIG 296 (H) 01/09/2021 1421   HDL 40 01/09/2021 1421   CHOLHDL 6.1 (H) 01/09/2021 1421   CHOLHDL 5.7 11/23/2019 1504   VLDL 58 (H) 11/23/2019 1504   LDLCALC 150 (H) 01/09/2021 1421    Hepatic Function Latest Ref Rng & Units 03/23/2021 01/09/2021 12/11/2019  Total  Protein 6.5 - 8.1 g/dL 7.6 6.9 6.8  Albumin 3.5 - 5.0 g/dL 3.9 4.4 3.7  AST 15 - 41 U/L _0 ALT 0 - 44 U/L _1 Alk Phosphatase 38 - 126 U/L 75 92 72  Total Bilirubin 0.3 - 1.2 mg/dL 0.2(L) 0.4 0.7    Lab Results  Component Value Date/Time   TSH 1.300 01/09/2021 02:21 PM    CBC Latest Ref Rng & Units 03/23/2021 01/09/2021  12/11/2019  WBC 4.0 - 10.5 K/uL 6.0 6.6 -  Hemoglobin 12.0 - 15.0 g/dL 9.2(L) 9.0(L) 11.6(L)  Hematocrit 36.0 - 46.0 % 30.0(L) 28.1(L) 34.0(L)  Platelets 150 - 400 K/uL 333 353 -    No results found for: VD25OH  Clinical ASCVD: No  The 10-year ASCVD risk score (Arnett DK, et al., 2019) is: 35.5%   Values used to calculate the score:     Age: 67 years     Sex: Female     Is Non-Hispanic African American: No     Diabetic: Yes     Tobacco smoker: No     Systolic Blood Pressure: 998 mmHg     Is BP treated: Yes     HDL Cholesterol: 40 mg/dL     Total Cholesterol: 245 mg/dL    Social History   Tobacco Use  Smoking Status Never  Smokeless Tobacco Never   BP Readings from Last 3 Encounters:  04/24/21 119/65  03/23/21 (!) 142/72  03/14/21 (!) 153/71   Pulse Readings from Last 3 Encounters:  03/23/21 66  03/14/21 79  02/26/21 80   Wt Readings from Last 3 Encounters:  03/23/21 158 lb 11.7 oz (72 kg)  02/26/21 159 lb 6.4 oz (72.3 kg)  01/15/21 160 lb 1.9 oz (72.6 kg)    Assessment: Review of patient past medical history, allergies, medications, health status, including review of consultants reports, laboratory and other test data, was performed as part of comprehensive evaluation and provision of chronic care management services.   SDOH:  (Social Determinants of Health) assessments and interventions performed:    CCM Care Plan  Allergies  Allergen Reactions   Other     Nickel staples   Clindamycin Phosphate Nausea And Vomiting    Medications Reviewed Today     Reviewed by Beryle Lathe, [facility] (Pharmacist) on 04/24/21 at 1452  Med List Status: <None>   Medication Order Taking? Sig Documenting Provider Last Dose Status Informant  ACCU-CHEK AVIVA PLUS test strip [id number]   [provider]  Active Self  Accu-Chek Softclix Lancets lancets [id number]  SMARTSIG:Topical [provider]  Active Self  amLODipine (NORVASC) 10 MG tablet [id number] Yes  Take 1 tablet (10 mg total) by mouth daily. Lindell Spar, MD Taking Active   aspirin-acetaminophen-caffeine [facility] EXTRA STRENGTH) [phone] MG tablet [id number] Yes Take 2 tablets by mouth in the morning. [provider] Taking Active Self  atorvastatin (LIPITOR) 80 MG tablet [id number] Yes Take 1 tablet (80 mg total) by mouth daily. Lindell Spar, MD Taking Active   Blood Glucose Monitoring Suppl (ACCU-CHEK GUIDE) w/Device Drucie Opitz [id number]   [provider]  Active Self  calcium-vitamin D (OSCAL-500) 500-400 MG-UNIT tablet [id number] Yes Take 1 tablet by mouth daily. [provider] Taking Active Self  Continuous Blood Gluc Receiver (FREESTYLE LIBRE 2 READER) DEVI [id number]  As directed Lindell Spar, MD  Active Self  Continuous Blood Gluc Sensor (FREESTYLE LIBRE 2 SENSOR) MISC [id number]  1 Piece by Does not  apply route every 14 (fourteen) days. Lindell Spar, MD  Active Self  desonide (DESOWEN) 0.05 % ointment [id number] Yes Apply 1 application topically 2 (two) times daily. Lindell Spar, MD Taking Active   glucosamine-chondroitin 500-400 MG tablet [id number] Yes Take 2 tablets by mouth daily. [provider] Taking Active   HUMULIN R 100 UNIT/ML injection [id number] Yes INJECT 10 UNITS  SUBCUTANEOUSLY 3 TIMES  DAILY BEFORE MEALS Lindell Spar, MD Taking Active            Med Note Jim Like Apr 04, 2021  1:29 PM) Using 5 units only when blood glucose >180  insulin glargine (LANTUS SOLOSTAR) 100 UNIT/ML Solostar Pen [id number] Yes Inject 20 Units into the skin at bedtime. Lindell Spar, MD Taking Active   Insulin Pen Needle (INSUPEN PEN NEEDLES) 31G X 5 MM MISC [id number]  1 each by Does not apply route 4 (four) times daily -  before meals and at bedtime. Lindell Spar, MD  Active   Insulin Syringe-Needle U-100 (BD INSULIN SYRINGE U/F) 31G X 5/16" 0.3 ML MISC [id number]  USE AS DIRECTED Lindell Spar, MD  Active   meclizine  (ANTIVERT) 25 MG tablet [id number] Yes Take 1 tablet (25 mg total) by mouth 3 (three) times daily as needed for dizziness. Fransico Meadow, [place] Taking Active   metFORMIN (GLUCOPHAGE) 1000 MG tablet [id number] Yes TAKE 1 TABLET BY MOUTH  TWICE DAILY WITH MEALS Lindell Spar, MD Taking Active   Multiple Vitamins-Minerals (PRESERVISION AREDS PO) [id number] Yes Take 2 capsules by mouth daily. [provider] Taking Active Self  nitrofurantoin, macrocrystal-monohydrate, (MACROBID) 100 MG capsule [id number]  Take 1 capsule (100 mg total) by mouth 2 (two) times daily. Lindell Spar, MD  Active   omega-3 acid ethyl esters (LOVAZA) 1 g capsule [id number] Yes Take 2 g by mouth 2 (two) times daily. [provider] Taking Active Self  OZEMPIC, 0.25 OR 0.5 MG/DOSE, 2 MG/1.5ML SOPN [id number] Yes INJECT SUBCUTANEOUSLY 0.5  MG EVERY WEEK [name], Colin Broach, MD Taking Active   phenazopyridine (PYRIDIUM) 100 MG tablet [id number]  Take 1 tablet (100 mg total) by mouth 3 (three) times daily as needed for pain. Lindell Spar, MD  Active   polyethylene glycol powder [facility]) 17 GM/SCOOP powder [id number] Yes Take 17 g by mouth daily as needed for mild constipation or moderate constipation.  [provider] Taking Active Self  tamsulosin (FLOMAX) 0.4 MG CAPS capsule [id number] No Take 1 capsule (0.4 mg total) by mouth daily after supper.  Patient not taking: Reported on 02/26/2021   Cleon Gustin, MD Not Taking Active   telmisartan (MICARDIS) 40 MG tablet [id number] Yes TAKE 1 TABLET BY MOUTH  DAILY Lindell Spar, MD Taking Active   valACYclovir (VALTREX) 1000 MG tablet [id number] Yes Take 1 g by mouth 2 (two) times daily as needed. [provider] Taking Active            Med Note Jim Like Apr 04, 2021  1:41 PM) Only for outbreak 3-4 times per year            Patient Active Problem List   Diagnosis Date Noted   Microcytic anemia 01/15/2021    Need for immunization against influenza 01/15/2021   Postprocedural female urethral stricture 11/27/2020   Idiopathic peripheral neuropathy 06/14/2020   Diabetic neuropathy ([place]) 06/14/2020   Gross hematuria 06/11/2020   Urge incontinence 03/08/2020  Essential hypertension 11/23/2019   Type 2 diabetes mellitus with hyperlipidemia ([place]) 11/23/2019   TIA (transient ischemic attack) 11/22/2019    Immunization History  Administered Date(s) Administered   Fluad Quad(high Dose 65+) 03/06/2020, 01/15/2021   Moderna Sars-Covid-2 Vaccination 05/31/2019, 07/01/2019, 04/05/2020   Zoster Recombinat (Shingrix) 03/10/2018, 06/22/2018    Conditions to be addressed/monitored: HTN, HLD, and DMII  Care Plan : Medication Management  Updates made by Beryle Lathe, [place] since 04/24/2021 12:00 AM     Problem: HTN, T2DM, HLD   Priority: High  Onset Date: 03/14/2021     Long-Range Goal: Disease Progression Prevention   Start Date: 03/14/2021  Expected End Date: 06/12/2021  Recent Progress: On track  Priority: High  Note:   Current Barriers:  Unable to independently monitor therapeutic efficacy Unable to achieve control of hyperlipidemia  Pharmacist Clinical Goal(s):  Patient will Achieve adherence to monitoring guidelines and medication adherence to achieve therapeutic efficacy Achieve control of hyperlipidemia as evidenced by improved LDL and improved triglycerides through collaboration with PharmD and provider.   Interventions: 1:1 collaboration with Lindell Spar, MD regarding development and update of comprehensive plan of care as evidenced by provider attestation and co-signature Inter-disciplinary care team collaboration (see longitudinal plan of care) Comprehensive medication review performed; medication list updated in electronic medical record  Type 2 Diabetes - Goal on Track (progressing): YES.: Improving per continuous glucose monitor ; Most recent A1c above goal of <7%  per ADA guidelines Current medications: metformin 1,000 mg by mouth twice daily, Lantus 20 units at bedtime, Ozempic 0.5 mg subcutaneously once weekly, and Humulin R (U-100) up to three time daily as needed Intolerances: none Taking medications as directed: no, patient reports often missing weekly dose of Ozempic. Dicussed strategies to prevent this in the future.  Side effects thought to be attributed to current medication regimen: no Hypoglycemia prevention: not indicated at this time Current meal patterns:  patient reports she is a vegetarian; has decreased Pepsi intake Current exercise:  currently not able to exercise much due to dizziness but does walk often On a statin: yes On aspirin 81 mg daily: no Last microalbumin: unknown; on an ACEi/ARB: yes Last eye exam: completed within last year Last foot exam: completed within last year Pneumonia vaccine: unknown Influenza vaccine: up to date Shingles: up to date Current glucose readings: see report below. Patient reports she is almost out of her [facility] and needs to order refill. Patient reports Freestyle Libre malfunction recently which is who % time CGM is active is lower than normal. Patient was previously instructed appropriate technique for injection of Ozempic and Lantus in the office.  Patient instructed to continue current regimen as above since her blood glucose has improved significantly without hypoglycemia. Can continue to titrate Ozempic at future visits. Would recommend to discontinue Humulin R eventually.  Patient's blood glucose appears to have improved since using the [facility] 2 (see report below). Average blood glucose over last 2 weeks is 145 with estimated A1c ~6.9%.  Repeat A1c at next PCP visit Instructed to monitor blood sugars continuously with continuous glucose monitor  Encouraged regular aerobic exercise with a goal of 30 minutes five times per week (150 minutes per week) Discussed management of  hypoglycemia. If blood sugar <70 at any time, treat with simple sugar such as 1/2 cup juice or regular soda or 3-4 glucose tablets. Recheck blood sugar in 15 minutes and repeat if blood sugar remains <70.      Hypertension -  Goal on Track (progressing): YES.: Blood pressure under good control. Blood pressure is at goal of <130/80 mmHg per 2017 AHA/ACC guidelines. Current medications: telmisartan 40 mg by mouth once daily and amlodipine 10 mg by mouth once daily Intolerances: none Taking medications as directed: yes Side effects thought to be attributed to current medication regimen: no Current home blood pressure: not discussed today Continue telmisartan 40 mg by mouth once daily and amlodipine 10 mg by mouth once daily. Encourage dietary sodium restriction/DASH diet Recommend regular aerobic exercise Recommend home blood pressure monitoring to discuss at next visit Discussed need for medication compliance  Hyperlipidemia/History of TIA - Goal on Track (progressing): YES.: Uncontrolled. LDL above goal of <70 due to very high risk given established clinical ASCVD per 2020 AACE/ACE guidelines. Triglycerides above goal of <150 per 2020 AACE/ACE guidelines. Current medications: atorvastatin 80 mg by mouth once daily and Lovaza 2g by mouth twice daily Intolerances: none Taking medications as directed:  Unclear. Patient reports adherence to above regimen but dispense history does not reflect excellent adherence Side effects thought to be attributed to current medication regimen: no Continue atorvastatin 80 mg by mouth once daily and Lovaza 2g by mouth twice daily Encourage dietary reduction of high fat containing foods such as butter, nuts, bacon, egg yolks, etc. Recommend regular aerobic exercise Reviewed risks of hyperlipidemia, principles of treatment and consequences of untreated hyperlipidemia Discussed need for medication compliance Re-check lipid panel at next PCP visit. If LDL remains  above goal, will consider addition of ezetimibe 10 mg by mouth daily  Patient Goals/Self-Care Activities Patient will:  Focus on medication adherence by keeping up with prescription refills and either using a pill box or reminders to take your medications at the prescribed times Check blood sugar continuously with continuous glucose monitor, document, and provide at future appointments Check blood pressure at least once daily, document, and provide at future appointments Target a minimum of 150 minutes of moderate intensity exercise weekly Engage in dietary modifications by decreased fat intake and fewer sweetened foods & beverages  Follow Up Plan: Face to Face appointment with care management team member scheduled for: 05/30/21      Medication Assistance: None required.  Patient affirms current coverage meets needs.  Patient's preferred pharmacy is:  [id number] - [place], [place] - [address] AT [place] [place] [address] Phone: [phone] Fax: [phone]  [id number] [place] [place]Phone: [phone]: [phone] [facility] ([place]Phone: [phone]: [phone] [facility] ) - [place] [address]Phone: [phone]: [phone] Follow Up:  Patient agrees to Care Plan and Follow-up.  Plan: Face to Face appointment with care management team member scheduled for: 05/30/21  CKennon Holter PharmD, BWest Roy Lake CHagamanClinical Pharmacist Practitioner [facility] [phone]

## 2021-04-24 NOTE — Patient Instructions (Signed)
Nonda Lou,  It was great to talk to you today!  Please call me with any questions or concerns.   Visit Information  Following are the goals we discussed today:  Patient Goals/Self-Care Activities Patient will:  Focus on medication adherence by keeping up with prescription refills and either using a pill box or reminders to take your medications at the prescribed times Check blood sugar continuously with continuous glucose monitor, document, and provide at future appointments Check blood pressure at least once daily, document, and provide at future appointments Target a minimum of 150 minutes of moderate intensity exercise weekly Engage in dietary modifications by decreased fat intake and fewer sweetened foods & beverages  Plan: Face to Face appointment with care management team member scheduled for: 05/30/21  Kennon Holter, PharmD, BCACP, CPP Clinical Pharmacist Practitioner [facility] [phone]   Please call the care guide team at [phone] if you need to cancel or reschedule your appointment.   Print copy of patient instructions, educational materials, and care plan provided in person.

## 2021-04-29 ENCOUNTER — Other Ambulatory Visit: Payer: Self-pay | Admitting: Internal Medicine

## 2021-04-29 DIAGNOSIS — E1169 Type 2 diabetes mellitus with other specified complication: Secondary | ICD-10-CM

## 2021-04-29 DIAGNOSIS — E785 Hyperlipidemia, unspecified: Secondary | ICD-10-CM

## 2021-05-01 ENCOUNTER — Telehealth: Payer: Self-pay

## 2021-05-01 NOTE — Telephone Encounter (Signed)
Pt asked can you please call in Cephalexin for Bladder pain today - has not slept in 2 nights because of the pain.  Please call into [facility] on [address].

## 2021-05-01 NOTE — Telephone Encounter (Signed)
NA NVM to let pt know she will need a visit before antibiotic can be sent to pharmacy. She can leave a urine and do virtual if she would like.

## 2021-05-02 NOTE — Telephone Encounter (Signed)
Patient called will get in touch with her daughter to bring her by for urine sample.

## 2021-05-24 ENCOUNTER — Ambulatory Visit (HOSPITAL_COMMUNITY)
Admission: RE | Admit: 2021-05-24 | Discharge: 2021-05-24 | Disposition: A | Discharge status: Home / Self Care | Payer: Medicare Other | Source: Ambulatory Visit | Attending: Internal Medicine | Admitting: Internal Medicine

## 2021-05-24 ENCOUNTER — Other Ambulatory Visit: Payer: Self-pay

## 2021-05-24 DIAGNOSIS — Z1231 Encounter for screening mammogram for malignant neoplasm of breast: Secondary | ICD-10-CM | POA: Diagnosis not present

## 2021-05-30 ENCOUNTER — Ambulatory Visit: Payer: Medicare Other

## 2021-05-30 ENCOUNTER — Ambulatory Visit: Payer: Medicare Other | Admitting: Internal Medicine

## 2021-06-04 ENCOUNTER — Telehealth: Payer: Self-pay | Admitting: *Deleted

## 2021-06-04 ENCOUNTER — Telehealth: Payer: Self-pay

## 2021-06-04 NOTE — Telephone Encounter (Signed)
error 

## 2021-06-04 NOTE — Chronic Care Management (AMB) (Signed)
°  Care Management   Note  06/04/2021 Name: Jessica Mclean MRN: [id number] DOB: [birth year]  Jessica Mclean is a 76 y.o. year old female who is a primary care patient of Lindell Spar, MD and is actively engaged with the care management team. I reached out to Nonda Lou by phone today to assist with re-scheduling a follow up visit with the Pharmacist  Follow up plan: Unsuccessful telephone outreach attempt made. The care management team will reach out to the patient again over the next 7 days. If patient returns call to provider office, please advise to call [place] at [phone].  [place] Management  Direct Dial: [phone]

## 2021-06-07 DIAGNOSIS — R69 Illness, unspecified: Secondary | ICD-10-CM | POA: Diagnosis not present

## 2021-06-13 ENCOUNTER — Ambulatory Visit: Payer: Self-pay | Admitting: Internal Medicine

## 2021-06-17 DIAGNOSIS — E1165 Type 2 diabetes mellitus with hyperglycemia: Secondary | ICD-10-CM | POA: Diagnosis not present

## 2021-06-17 DIAGNOSIS — R69 Illness, unspecified: Secondary | ICD-10-CM | POA: Diagnosis not present

## 2021-06-18 ENCOUNTER — Encounter: Payer: Self-pay | Admitting: Internal Medicine

## 2021-06-18 ENCOUNTER — Ambulatory Visit (INDEPENDENT_AMBULATORY_CARE_PROVIDER_SITE_OTHER): Payer: Medicare Other | Admitting: Internal Medicine

## 2021-06-18 ENCOUNTER — Other Ambulatory Visit: Payer: Self-pay

## 2021-06-18 VITALS — BP 118/62 | HR 83 | Resp 18 | Ht 64.0 in | Wt 148.0 lb

## 2021-06-18 DIAGNOSIS — K5901 Slow transit constipation: Secondary | ICD-10-CM | POA: Insufficient documentation

## 2021-06-18 DIAGNOSIS — E1169 Type 2 diabetes mellitus with other specified complication: Secondary | ICD-10-CM

## 2021-06-18 DIAGNOSIS — N3020 Other chronic cystitis without hematuria: Secondary | ICD-10-CM | POA: Diagnosis not present

## 2021-06-18 DIAGNOSIS — M25519 Pain in unspecified shoulder: Secondary | ICD-10-CM | POA: Insufficient documentation

## 2021-06-18 DIAGNOSIS — Z1159 Encounter for screening for other viral diseases: Secondary | ICD-10-CM | POA: Diagnosis not present

## 2021-06-18 DIAGNOSIS — G4733 Obstructive sleep apnea (adult) (pediatric): Secondary | ICD-10-CM | POA: Insufficient documentation

## 2021-06-18 DIAGNOSIS — N824 Other female intestinal-genital tract fistulae: Secondary | ICD-10-CM | POA: Insufficient documentation

## 2021-06-18 DIAGNOSIS — E1149 Type 2 diabetes mellitus with other diabetic neurological complication: Secondary | ICD-10-CM

## 2021-06-18 DIAGNOSIS — F339 Major depressive disorder, recurrent, unspecified: Secondary | ICD-10-CM | POA: Insufficient documentation

## 2021-06-18 DIAGNOSIS — I10 Essential (primary) hypertension: Secondary | ICD-10-CM | POA: Diagnosis not present

## 2021-06-18 DIAGNOSIS — R319 Hematuria, unspecified: Secondary | ICD-10-CM | POA: Insufficient documentation

## 2021-06-18 DIAGNOSIS — R69 Illness, unspecified: Secondary | ICD-10-CM | POA: Diagnosis not present

## 2021-06-18 DIAGNOSIS — E785 Hyperlipidemia, unspecified: Secondary | ICD-10-CM

## 2021-06-18 DIAGNOSIS — Z8673 Personal history of transient ischemic attack (TIA), and cerebral infarction without residual deficits: Secondary | ICD-10-CM

## 2021-06-18 DIAGNOSIS — G43009 Migraine without aura, not intractable, without status migrainosus: Secondary | ICD-10-CM | POA: Insufficient documentation

## 2021-06-18 DIAGNOSIS — F32A Depression, unspecified: Secondary | ICD-10-CM | POA: Insufficient documentation

## 2021-06-18 DIAGNOSIS — E663 Overweight: Secondary | ICD-10-CM | POA: Insufficient documentation

## 2021-06-18 DIAGNOSIS — Z8709 Personal history of other diseases of the respiratory system: Secondary | ICD-10-CM | POA: Insufficient documentation

## 2021-06-18 DIAGNOSIS — M545 Low back pain, unspecified: Secondary | ICD-10-CM | POA: Insufficient documentation

## 2021-06-18 DIAGNOSIS — R053 Chronic cough: Secondary | ICD-10-CM | POA: Insufficient documentation

## 2021-06-18 MED ORDER — NITROFURANTOIN MACROCRYSTAL 50 MG PO CAPS: 50.0000 mg | ORAL_CAPSULE | Freq: Every day | ORAL | 2 refills | Status: DC

## 2021-06-18 MED ORDER — SEMAGLUTIDE (1 MG/DOSE) 4 MG/3ML ~~LOC~~ SOPN: 1.0000 mg | PEN_INJECTOR | SUBCUTANEOUS | 2 refills | Status: DC

## 2021-06-18 NOTE — Assessment & Plan Note (Signed)
BP Readings from Last 1 Encounters:  06/18/21 118/62   Well-controlled with telmisartan 40 mg daily and amlodipine 10 mg daily now Counseled for compliance with the medications Advised DASH diet and moderate exercise/walking as tolerated

## 2021-06-18 NOTE — Assessment & Plan Note (Signed)
H/o TIA in 12/2019 On Aspirin and statin No neurological deficit

## 2021-06-18 NOTE — Assessment & Plan Note (Addendum)
Lab Results  Component Value Date   HGBA1C 9.2 (H) 01/09/2021   Has started Lantus 20 U qHS and Ozempic - now improved, check HbA1C Increased Ozempic dose to 1 mg and decrease Lantus to 16 U qHS now Advised to follow diabetic diet On ARB and statin Diabetic eye exam: Advised to follow up with Ophthalmology for diabetic eye exam

## 2021-06-18 NOTE — Patient Instructions (Signed)
Please start taking Ozempic 1 mg instead of 0.5 mg.  Please start taking Macrobid 50 mg once daily.  Please continue to take other medications as prescribed.

## 2021-06-18 NOTE — Progress Notes (Signed)
Established Patient Office Visit  Subjective:  Patient ID: Jessica Mclean, female    DOB: 1945-12-01  Age: 76 y.o. MRN: [id number]  CC:  Chief Complaint  Patient presents with   Follow-up    3 month follow up DM and HTN still having problems urinating cant sleep at night     HPI Jessica Mclean is a 76 y.o. female with past medical history of  HTN, DM2 on insulin, TIA, HLD and cervical ca. s/p radiation who presents for f/u of her chronic medical conditions.  HTN: BP is well-controlled. Takes medications regularly. Patient denies headache, dizziness, chest pain, dyspnea or palpitations.  Type II DM: She has started using Ozempic and Lantus 20 units nightly.  She has not required short-acting insulin for a long time.  She has freestyle [place], which shows in range blood glucose around 85% of the time now.  She denies any polyuria or polyphagia currently.  She complains of chronic suprapubic discomfort and incontinence.  She has tried taking Pyridium for dysuria, but continues to have it.  Denies any hematuria, flank or pelvic pain currently.  Past Medical History:  Diagnosis Date   Bleeding disorder ([place])    Cervical cancer ([place]) 2010   Diabetes mellitus without complication ([place])    type 2   Diabetes mellitus without complication ([place])    Heart murmur    High cholesterol    HLD (hyperlipidemia)    Hypertension    Mental disorder    depression; suicidal ideation in 2017   Sleep apnea    Vitamin D deficiency     Past Surgical History:  Procedure Laterality Date   BILATERAL OOPHORECTOMY     CESAREAN SECTION     x 4   CHOLECYSTECTOMY     COLOSTOMY     x2   CYSTOSCOPY WITH URETHRAL DILATATION N/A 11/15/2020   Procedure: CYSTOSCOPY WITH URETHRAL DILATION;  Surgeon: Cleon Gustin, MD;  Location: [facility];  Service: Urology;  Laterality: N/A;   SALPINGECTOMY     TONSILLECTOMY     TONSILLECTOMY AND ADENOIDECTOMY      Family History  Problem Relation Age of Onset    Cancer Father        kidney   Cancer Mother        lung   Other Brother        heart issues    Social History   Socioeconomic History   Marital status: Divorced    Spouse name: Not on file   Number of children: 3   Years of education: Not on file   Highest education level: Not on file  Occupational History   Occupation: retired  Tobacco Use   Smoking status: Never   Smokeless tobacco: Never  Vaping Use   Vaping Use: Never used  Substance and Sexual Activity   Alcohol use: Never   Drug use: Never   Sexual activity: Not Currently    Birth control/protection: Post-menopausal  Other Topics Concern   Not on file  Social History Narrative   ** Merged History Encounter **       Social Determinants of Health   Financial Resource Strain: Medium Risk   Difficulty of Paying Living Expenses: Somewhat hard  Food Insecurity: Food Insecurity Present   Worried About Running Out of Food in the Last Year: Sometimes true   [place] in the Last Year: Sometimes true  Transportation Needs: No Transportation Needs   Lack of Transportation (Medical): No  Lack of Transportation (Non-Medical): No  Physical Activity: Insufficiently Active   Days of Exercise per Week: 4 days   Minutes of Exercise per Session: 30 min  Stress: No Stress Concern Present   Feeling of Stress : Only a little  Social Connections: Socially Isolated   Frequency of Communication with Friends and Family: More than three times a week   Frequency of Social Gatherings with Friends and Family: Twice a week   Attends Religious Services: Never   Marine scientist or Organizations: No   Attends Archivist Meetings: Never   Marital Status: Widowed  Intimate Partner Violence: Not on file    Outpatient Medications Prior to Visit  Medication Sig Dispense Refill   ACCU-CHEK AVIVA PLUS test strip      Accu-Chek Softclix Lancets lancets SMARTSIG:Topical     amLODipine (NORVASC) 10 MG tablet Take 1  tablet (10 mg total) by mouth daily. 90 tablet 3   aspirin-acetaminophen-caffeine (EXCEDRIN EXTRA STRENGTH) 250-250-65 MG tablet Take 2 tablets by mouth in the morning.     atorvastatin (LIPITOR) 80 MG tablet Take 1 tablet (80 mg total) by mouth daily. 90 tablet 3   Blood Glucose Monitoring Suppl (ACCU-CHEK GUIDE) w/Device KIT      calcium-vitamin D (OSCAL-500) 500-400 MG-UNIT tablet Take 1 tablet by mouth daily.     Continuous Blood Gluc Receiver (FREESTYLE LIBRE 2 READER) DEVI As directed 1 each 0   Continuous Blood Gluc Sensor (FREESTYLE LIBRE 2 SENSOR) MISC 1 Piece by Does not apply route every 14 (fourteen) days. 2 each 3   desonide (DESOWEN) 0.05 % ointment Apply 1 application topically 2 (two) times daily. 60 g 1   glucosamine-chondroitin 500-400 MG tablet Take 2 tablets by mouth daily.     Insulin Pen Needle (INSUPEN PEN NEEDLES) 31G X 5 MM MISC 1 each by Does not apply route 4 (four) times daily -  before meals and at bedtime. 100 each 1   Insulin Syringe-Needle U-100 (BD INSULIN SYRINGE U/F) 31G X 5/16" 0.3 ML MISC USE AS DIRECTED 100 each 0   LANTUS SOLOSTAR 100 UNIT/ML Solostar Pen INJECT SUBCUTANEOUSLY 20  UNITS AT BEDTIME 30 mL 3   meclizine (ANTIVERT) 25 MG tablet Take 1 tablet (25 mg total) by mouth 3 (three) times daily as needed for dizziness. 30 tablet 0   metFORMIN (GLUCOPHAGE) 1000 MG tablet TAKE 1 TABLET BY MOUTH  TWICE DAILY WITH MEALS 180 tablet 3   Multiple Vitamins-Minerals (PRESERVISION AREDS PO) Take 2 capsules by mouth daily.     omega-3 acid ethyl esters (LOVAZA) 1 g capsule Take 2 g by mouth 2 (two) times daily.     phenazopyridine (PYRIDIUM) 100 MG tablet Take 1 tablet (100 mg total) by mouth 3 (three) times daily as needed for pain. 90 tablet 0   polyethylene glycol powder (GLYCOLAX/MIRALAX) 17 GM/SCOOP powder Take 17 g by mouth daily as needed for mild constipation or moderate constipation.      tamsulosin (FLOMAX) 0.4 MG CAPS capsule Take 1 capsule (0.4 mg total)  by mouth daily after supper. 30 capsule 11   telmisartan (MICARDIS) 40 MG tablet TAKE 1 TABLET BY MOUTH  DAILY 90 tablet 3   valACYclovir (VALTREX) 1000 MG tablet Take 1 g by mouth 2 (two) times daily as needed.     HUMULIN R 100 UNIT/ML injection INJECT 10 UNITS  SUBCUTANEOUSLY 3 TIMES  DAILY BEFORE MEALS 30 mL 3   nitrofurantoin, macrocrystal-monohydrate, (MACROBID) 100 MG capsule Take  1 capsule (100 mg total) by mouth 2 (two) times daily. 14 capsule 0   OZEMPIC, 0.25 OR 0.5 MG/DOSE, 2 MG/1.5ML SOPN INJECT SUBCUTANEOUSLY 0.5  MG EVERY WEEK 3 mL 5   No facility-administered medications prior to visit.    Allergies  Allergen Reactions   Other     Nickel staples   Clindamycin Phosphate Nausea And Vomiting    ROS Review of Systems  Constitutional:  Negative for chills and fever.  HENT:  Negative for congestion, sinus pressure, sinus pain and sore throat.   Eyes:  Negative for pain and discharge.  Respiratory:  Negative for cough and shortness of breath.   Cardiovascular:  Negative for chest pain and palpitations.  Gastrointestinal:  Negative for diarrhea, nausea and vomiting.  Endocrine: Negative for polydipsia and polyuria.  Genitourinary:  Positive for dysuria and urgency. Negative for flank pain, frequency and hematuria.  Musculoskeletal:  Negative for neck pain and neck stiffness.  Skin:  Negative for rash.  Neurological:  Negative for dizziness, syncope, speech difficulty, weakness and numbness.  Psychiatric/Behavioral:  Negative for agitation and behavioral problems.      Objective:    Physical Exam Vitals reviewed.  Constitutional:      General: She is not in acute distress.    Appearance: She is not diaphoretic.  HENT:     Head: Normocephalic and atraumatic.     Nose: Nose normal.     Mouth/Throat:     Mouth: Mucous membranes are moist.  Eyes:     General: No scleral icterus.    Extraocular Movements: Extraocular movements intact.  Cardiovascular:     Rate and  Rhythm: Normal rate and regular rhythm.     Pulses: Normal pulses.     Heart sounds: Normal heart sounds. No murmur heard. Pulmonary:     Breath sounds: Normal breath sounds. No wheezing or rales.  Abdominal:     Palpations: Abdomen is soft.     Tenderness: There is abdominal tenderness (Mild, suprapubic).  Musculoskeletal:     Cervical back: Neck supple. No tenderness.     Right lower leg: No edema.     Left lower leg: No edema.  Skin:    General: Skin is warm.     Findings: No bruising.  Neurological:     General: No focal deficit present.     Mental Status: She is alert and oriented to person, place, and time.     Sensory: No sensory deficit.     Motor: No weakness.  Psychiatric:        Mood and Affect: Mood normal.        Behavior: Behavior normal.    BP 118/62 (BP Location: Left Arm, Patient Position: Sitting, Cuff Size: Normal)    Pulse 83    Resp 18    Ht _0  (1.626 m)    Wt 148 lb (67.1 kg)    LMP  (LMP Unknown)    SpO2 98%    BMI 25.40 kg/m  Wt Readings from Last 3 Encounters:  06/18/21 148 lb (67.1 kg)  03/23/21 158 lb 11.7 oz (72 kg)  02/26/21 159 lb 6.4 oz (72.3 kg)    Lab Results  Component Value Date   TSH 1.300 01/09/2021   Lab Results  Component Value Date   WBC 6.0 03/23/2021   HGB 9.2 (L) 03/23/2021   HCT 30.0 (L) 03/23/2021   MCV 79.8 (L) 03/23/2021   PLT 333 03/23/2021   Lab Results  Component Value  Date   NA 136 03/23/2021   K 4.3 03/23/2021   CO2 23 03/23/2021   GLUCOSE 173 (H) 03/23/2021   BUN 16 03/23/2021   CREATININE 0.83 03/23/2021   BILITOT 0.2 (L) 03/23/2021   ALKPHOS 75 03/23/2021   AST 22 03/23/2021   ALT 16 03/23/2021   PROT 7.6 03/23/2021   ALBUMIN 3.9 03/23/2021   CALCIUM 9.3 03/23/2021   ANIONGAP 11 03/23/2021   EGFR 73 01/09/2021   Lab Results  Component Value Date   CHOL 245 (H) 01/09/2021   Lab Results  Component Value Date   HDL 40 01/09/2021   Lab Results  Component Value Date   LDLCALC 150 (H)  01/09/2021   Lab Results  Component Value Date   TRIG 296 (H) 01/09/2021   Lab Results  Component Value Date   CHOLHDL 6.1 (H) 01/09/2021   Lab Results  Component Value Date   HGBA1C 9.2 (H) 01/09/2021      Assessment & Plan:   Problem List Items Addressed This Visit       Cardiovascular and Mediastinum   Essential hypertension    BP Readings from Last 1 Encounters:  06/18/21 118/62  Well-controlled with telmisartan 40 mg daily and amlodipine 10 mg daily now Counseled for compliance with the medications Advised DASH diet and moderate exercise/walking as tolerated        Endocrine   Type 2 diabetes mellitus with hyperlipidemia ([place]) - Primary    Lab Results  Component Value Date   HGBA1C 9.2 (H) 01/09/2021  Has started Lantus 20 U qHS and Ozempic - now improved, check HbA1C Increased Ozempic dose to 1 mg and decrease Lantus to 16 U qHS now Advised to follow diabetic diet On ARB and statin Diabetic eye exam: Advised to follow up with Ophthalmology for diabetic eye exam      Relevant Medications   Semaglutide, 1 MG/DOSE, 4 MG/3ML SOPN   Other Relevant Orders   CMP14+EGFR   Hemoglobin A1c   Lipid Profile   Diabetic neuropathy (HCC)    On Gabapentin 100 mg QD Still has numbness at times, patient prefers to stay on same dose for now.      Relevant Medications   Semaglutide, 1 MG/DOSE, 4 MG/3ML SOPN     Genitourinary   Chronic cystitis    Check UA and urine culture Would continue Macrobid ppx dose now      Relevant Medications   nitrofurantoin (MACRODANTIN) 50 MG capsule   Other Relevant Orders   UA/M w/rflx Culture, Routine     Other   History of TIA (transient ischemic attack)    H/o TIA in 12/2019 On Aspirin and statin No neurological deficit      Other Visit Diagnoses     Need for hepatitis C screening test       Relevant Orders   Hepatitis C Antibody       Meds ordered this encounter  Medications   nitrofurantoin (MACRODANTIN) 50 MG  capsule    Sig: Take 1 capsule (50 mg total) by mouth at bedtime.    Dispense:  30 capsule    Refill:  2   Semaglutide, 1 MG/DOSE, 4 MG/3ML SOPN    Sig: Inject 1 mg as directed once a week.    Dispense:  3 mL    Refill:  2    Follow-up: Return in about 4 months (around 10/16/2021) for DM and HTN.    Lindell Spar, MD

## 2021-06-18 NOTE — Assessment & Plan Note (Signed)
Check UA and urine culture Would continue Macrobid ppx dose now

## 2021-06-18 NOTE — Assessment & Plan Note (Signed)
On Gabapentin 100 mg QD Still has numbness at times, patient prefers to stay on same dose for now. 

## 2021-06-19 ENCOUNTER — Telehealth: Payer: Self-pay | Admitting: Internal Medicine

## 2021-06-19 NOTE — Chronic Care Management (AMB) (Signed)
°  Care Management   Note  06/19/2021 Name: Jessica Mclean MRN: [id number] DOB: 16-Jun-1945  Jessica Mclean is a 76 y.o. year old female who is a primary care patient of Lindell Spar, MD and is actively engaged with the care management team. I reached out to Nonda Lou by phone today to assist with re-scheduling a follow up visit with the Pharmacist.  Follow up plan: Unsuccessful telephone outreach attempt made. The care management team will reach out to the patient again over the next 7 days. If patient returns call to provider office, please advise to call [place] at [phone].  [place] Management  Direct Dial: [phone]

## 2021-06-19 NOTE — Telephone Encounter (Signed)
Pt advised with verbal understanding  °

## 2021-06-19 NOTE — Telephone Encounter (Signed)
Pt returning call for lab results  

## 2021-06-20 ENCOUNTER — Other Ambulatory Visit: Payer: Self-pay | Admitting: Internal Medicine

## 2021-06-22 LAB — URINE CULTURE

## 2021-06-22 LAB — CMP14+EGFR
ALT: 12 IU/L (ref 0–32)
AST: 15 IU/L (ref 0–40)
Albumin/Globulin Ratio: 1.8 (ref 1.2–2.2)
Albumin: 4.8 g/dL — ABNORMAL HIGH (ref 3.7–4.7)
Alkaline Phosphatase: 87 IU/L (ref 44–121)
BUN/Creatinine Ratio: 10 — ABNORMAL LOW (ref 12–28)
BUN: 12 mg/dL (ref 8–27)
Bilirubin Total: 0.5 mg/dL (ref 0.0–1.2)
CO2: 22 mmol/L (ref 20–29)
Calcium: 9.8 mg/dL (ref 8.7–10.3)
Chloride: 99 mmol/L (ref 96–106)
Creatinine, Ser: 1.25 mg/dL — ABNORMAL HIGH (ref 0.57–1.00)
Globulin, Total: 2.7 g/dL (ref 1.5–4.5)
Glucose: 129 mg/dL — ABNORMAL HIGH (ref 70–99)
Potassium: 4.4 mmol/L (ref 3.5–5.2)
Sodium: 139 mmol/L (ref 134–144)
Total Protein: 7.5 g/dL (ref 6.0–8.5)
eGFR: 45 mL/min/{1.73_m2} — ABNORMAL LOW (ref >59)

## 2021-06-22 LAB — HEMOGLOBIN A1C
Est. average glucose Bld gHb Est-mCnc: 157 mg/dL
Hgb A1c MFr Bld: 7.1 % — ABNORMAL HIGH (ref 4.8–5.6)

## 2021-06-22 LAB — LIPID PANEL
Chol/HDL Ratio: 5.4 ratio — ABNORMAL HIGH (ref 0.0–4.4)
Cholesterol, Total: 190 mg/dL (ref 100–199)
HDL: 35 mg/dL — ABNORMAL LOW (ref >39)
LDL Chol Calc (NIH): 115 mg/dL — ABNORMAL HIGH (ref 0–99)
Triglycerides: 225 mg/dL — ABNORMAL HIGH (ref 0–149)
VLDL Cholesterol Cal: 40 mg/dL (ref 5–40)

## 2021-06-22 LAB — HEPATITIS C ANTIBODY: Hep C Virus Ab: <0.1 s/co ratio (ref 0.0–0.9)

## 2021-06-27 NOTE — Chronic Care Management (AMB) (Signed)
°  Care Management   Note  06/27/2021 Name: Jessica Mclean MRN: [id number] DOB: 14-Mar-1946  Jessica Mclean is a 77 y.o. year old female who is a primary care patient of Lindell Spar, MD and is actively engaged with the care management team. I reached out to Jessica Mclean by phone today to assist with re-scheduling a follow up visit with the Pharmacist  Follow up plan: Telephone appointment with care management team member scheduled for:07/30/21  [place], [place] Management  Direct Dial: [phone]

## 2021-07-18 DIAGNOSIS — E1165 Type 2 diabetes mellitus with hyperglycemia: Secondary | ICD-10-CM | POA: Diagnosis not present

## 2021-07-30 ENCOUNTER — Ambulatory Visit (INDEPENDENT_AMBULATORY_CARE_PROVIDER_SITE_OTHER): Payer: Medicare Other | Admitting: Pharmacist

## 2021-07-30 DIAGNOSIS — E785 Hyperlipidemia, unspecified: Secondary | ICD-10-CM

## 2021-07-30 DIAGNOSIS — E1169 Type 2 diabetes mellitus with other specified complication: Secondary | ICD-10-CM

## 2021-07-30 DIAGNOSIS — I10 Essential (primary) hypertension: Secondary | ICD-10-CM

## 2021-07-30 DIAGNOSIS — Z8673 Personal history of transient ischemic attack (TIA), and cerebral infarction without residual deficits: Secondary | ICD-10-CM

## 2021-07-30 NOTE — Chronic Care Management (AMB) (Signed)
? ? ?Chronic Care Management ?Pharmacy Note ? ?07/30/2021 ?Name:  Jessica Mclean MRN:  [id number] DOB:  1945/11/19 ? ?Summary: ?Type 2 Diabetes ?Improving; Most recent A1c improved to 7.1% which is slightly above goal of <7% per ADA guidelines ?Current medications: metformin 1,000 mg by mouth twice daily, Lantus 20 units at bedtime, and Ozempic 1 mg subcutaneously once weekly ?Recent changes: primary care provider increased Ozempic to 1 mg weekly and decreased Lantus to 16 units at bedtime. Humulin R discontinued. ?Taking medications as directed: no, patient reports still taking Lantus 20 units at bedtime. She was unaware of Lantus dose change. Denied hypoglycemia.  ?Current glucose readings: see LibreView report below for last 4 weeks. Patient does not appear to be scanning frequently which is why % time continuous glucose monitor is active is low. No calculable GMI. For the data we do have, she appears to be in range most of the time.  ?Patient instructed to continue current regimen as above since her blood glucose has improved significantly without hypoglycemia.  ?Instructed to monitor blood sugars continuously with continuous glucose monitor. Patient reminded to scan at least once every 8 hours to prevent gaps in data.   ? ? ? ? ?Hyperlipidemia/History of TIA ?Uncontrolled but improving. LDL above goal of <55 due to extreme risk given established clinical ASCVD + diabetes per 2023 ADA Standards of Care in Diabetes. Triglycerides above goal of <150 per 2020 AACE/ACE guidelines. ?Continue atorvastatin 80 mg by mouth once daily and Lovaza 2g by mouth twice daily ?Consider addition of ezetimibe 10 mg by mouth daily ? ?Subjective: ?Jessica Mclean is an 76 y.o. year old female who is a primary patient of Lindell Spar, MD.  The CCM team was consulted for assistance with disease management and care coordination needs.   ? ?Engaged with patient by telephone for follow up visit in response to provider referral for  pharmacy case management and/or care coordination services.  ? ?Consent to Services:  ?The patient was given information about Chronic Care Management services, agreed to services, and gave verbal consent prior to initiation of services.  Please see initial visit note for detailed documentation.  ? ?Patient Care Team: ?Lindell Spar, MD as PCP - General (Internal Medicine) ?Jani Gravel, MD (Internal Medicine) ?Beryle Lathe, [facility] (Pharmacist) ? ?Objective: ? ?Lab Results  ?Component Value Date  ? CREATININE 1.25 (H) 06/18/2021  ? CREATININE 0.83 03/23/2021  ? CREATININE 0.83 01/09/2021  ? ? ?Lab Results  ?Component Value Date  ? HGBA1C 7.1 (H) 06/18/2021  ? ?Last diabetic Eye exam:  ?Lab Results  ?Component Value Date/Time  ? HMDIABEYEEXA No Retinopathy 06/26/2020 12:00 AM  ?  ?Last diabetic Foot exam: No results found for: HMDIABFOOTEX  ? ?   ?Component Value Date/Time  ? CHOL 190 06/18/2021 1706  ? TRIG 225 (H) 06/18/2021 1706  ? HDL 35 (L) 06/18/2021 1706  ? CHOLHDL 5.4 (H) 06/18/2021 1706  ? CHOLHDL 5.7 11/23/2019 1504  ? VLDL 58 (H) 11/23/2019 1504  ? [place] 115 (H) 06/18/2021 1706  ? ? ?Hepatic Function Latest Ref Rng & Units 06/18/2021 03/23/2021 01/09/2021  ?Total Protein 6.0 - 8.5 g/dL 7.5 7.6 6.9  ?Albumin 3.7 - 4.7 g/dL 4.8(H) 3.9 4.4  ?AST 0 - 40 IU/L _0 ?ALT 0 - 32 IU/L _1 ?Alk Phosphatase 44 - 121 IU/L 87 75 92  ?Total Bilirubin 0.0 - 1.2 mg/dL 0.5 0.2(L) 0.4  ? ? ?Lab Results  ?Component Value  Date/Time  ? TSH 1.300 01/09/2021 02:21 PM  ? ? ?CBC Latest Ref Rng & Units 03/23/2021 01/09/2021 12/11/2019  ?WBC 4.0 - 10.5 K/uL 6.0 6.6 -  ?Hemoglobin 12.0 - 15.0 g/dL 9.2(L) 9.0(L) 11.6(L)  ?Hematocrit 36.0 - 46.0 % 30.0(L) 28.1(L) 34.0(L)  ?Platelets 150 - 400 K/uL 333 353 -  ? ? ?No results found for: VD25OH ? ?Clinical ASCVD: Yes  ?The 10-year ASCVD risk score (Arnett DK, et al., 2019) is: 34.5% ?  Values used to calculate the score: ?    Age: 76 years ?    Sex: Female ?    Is  Non-Hispanic African American: No ?    Diabetic: Yes ?    Tobacco smoker: No ?    Systolic Blood Pressure: 697 mmHg ?    Is BP treated: Yes ?    HDL Cholesterol: 35 mg/dL ?    Total Cholesterol: 190 mg/dL   ? ?Social History  ? ?Tobacco Use  ?Smoking Status Never  ?Smokeless Tobacco Never  ? ?BP Readings from Last 3 Encounters:  ?06/18/21 118/62  ?04/24/21 119/65  ?03/23/21 (!) 142/72  ? ?Pulse Readings from Last 3 Encounters:  ?06/18/21 83  ?03/23/21 66  ?03/14/21 79  ? ?Wt Readings from Last 3 Encounters:  ?06/18/21 148 lb (67.1 kg)  ?03/23/21 158 lb 11.7 oz (72 kg)  ?02/26/21 159 lb 6.4 oz (72.3 kg)  ? ? ?Assessment: Review of patient past medical history, allergies, medications, health status, including review of consultants reports, laboratory and other test data, was performed as part of comprehensive evaluation and provision of chronic care management services.  ? ?SDOH:  (Social Determinants of Health) assessments and interventions performed:  ? ? ?CCM Care Plan ? ?Allergies  ?Allergen Reactions  ? Other   ?  Nickel staples  ? Clindamycin Phosphate Nausea And Vomiting  ? ? ?Medications Reviewed Today   ? ? Reviewed by Beryle Lathe, [facility] (Pharmacist) on 07/30/21 at 1253  Med List Status: <None>  ? ?Medication Order Taking? Sig Documenting Provider Last Dose Status Informant  ?ACCU-CHEK AVIVA PLUS test strip [id number] Yes  [provider] Taking Active Self  ?Accu-Chek Softclix Lancets lancets [id number] Yes SMARTSIG:Topical [provider] Taking Active Self  ?amLODipine (NORVASC) 10 MG tablet [id number] Yes Take 1 tablet (10 mg total) by mouth daily. Lindell Spar, MD Taking Active   ?aspirin-acetaminophen-caffeine [place]ear[place] Adjutant EXTRA STRENGTH) [phone] MG tablet [id number] Yes Take 2 tablets by mouth in the morning. [provider] Taking Active Self  ?atorvastatin (LIPITOR) 80 MG tablet [id number] Yes Take 1 tablet (80 mg total) by mouth daily. Lindell Spar, MD Taking  Active   ?Blood Glucose Monitoring Suppl (ACCU-CHEK GUIDE) w/Device KIT [id number] Yes  [provider] Taking Active Self  ?calcium-vitamin D (OSCAL-500) 500-400 MG-UNIT tablet [id number] Yes Take 1 tablet by mouth daily. [provider] Taking Active Self  ?Continuous Blood Gluc Receiver (FREESTYLE LIBRE 2 READER) DEVI [id number] No As directed  ?Patient not taking: Reported on 07/30/2021  ? Lindell Spar, MD Not Taking Active Self  ?Continuous Blood Gluc Sensor (FREESTYLE LIBRE 2 SENSOR) MISC [id number] Yes 1 Piece by Does not apply route every 14 (fourteen) days. Lindell Spar, MD Taking Active Self  ?desonide (DESOWEN) 0.05 % ointment [id number] Yes Apply 1 application topically 2 (two) times daily. Lindell Spar, MD Taking Active   ?glucosamine-chondroitin 500-400 MG tablet [id number] Yes Take 2 tablets by mouth daily. [provider] Taking Active   ?  LANTUS SOLOSTAR 100 UNIT/ML Solostar Pen [id number] Yes INJECT SUBCUTANEOUSLY 20  UNITS AT BEDTIME Lindell Spar, MD Taking Active   ?meclizine (ANTIVERT) 25 MG tablet [id number] Yes Take 1 tablet (25 mg total) by mouth 3 (three) times daily as needed for dizziness. Fransico Meadow, PA-C Taking Active   ?metFORMIN (GLUCOPHAGE) 1000 MG tablet [id number] Yes TAKE 1 TABLET BY MOUTH  TWICE DAILY WITH MEALS Lindell Spar, MD Taking Active   ?Multiple Vitamins-Minerals (PRESERVISION AREDS PO) [id number] Yes Take 2 capsules by mouth daily. [provider] Taking Active Self  ?nitrofurantoin (MACRODANTIN) 50 MG capsule [id number] Yes Take 1 capsule (50 mg total) by mouth at bedtime. Lindell Spar, MD Taking Active   ?omega-3 acid ethyl esters (LOVAZA) 1 g capsule [id number] Yes Take 2 g by mouth 2 (two) times daily. [provider] Taking Active Self  ?phenazopyridine (PYRIDIUM) 100 MG tablet [id number] Yes Take 1 tablet (100 mg total) by mouth 3 (three) times daily as needed for pain. Lindell Spar, MD Taking Active    ?polyethylene glycol powder (GLYCOLAX/MIRALAX) 17 GM/SCOOP powder [id number] Yes Take 17 g by mouth daily as needed for mild constipation or moderate constipation.  [provider] Taking Active Self  ?Semaglutide, 1 M

## 2021-07-30 NOTE — Patient Instructions (Signed)
Nonda Lou, ? ?It was great to talk to you today! ? ?Please call me with any questions or concerns. ? ?Visit Information ? ?Following are the goals we discussed today:  ? Goals Addressed   ? ?  ?  ?  ?  ? This Visit's Progress  ?  Medication Management     ?  Patient Goals/Self-Care Activities ?Patient will:  ?Focus on medication adherence by keeping up with prescription refills and either using a pill box or reminders to take your medications at the prescribed times ?Check blood sugar continuously with continuous glucose monitor, document, and provide at future appointments ?Check blood pressure at least once daily, document, and provide at future appointments ?Target a minimum of 150 minutes of moderate intensity exercise weekly ?Engage in dietary modifications by decreased fat intake and fewer sweetened foods & beverages ? ?  ? ?  ?  ? ?Follow-up plan: Next PCP appointment scheduled for: 10/16/21 ? ?The patient verbalized understanding of instructions, educational materials, and care plan provided today and declined offer to receive copy of patient instructions, educational materials, and care plan.  ? ?Please call the care guide team at [phone] if you need to cancel or reschedule your appointment.  ? ?Kennon Holter, PharmD, BCACP, CPP ?Clinical Pharmacist Practitioner ?[place] ?[phone]  ?

## 2021-08-01 ENCOUNTER — Ambulatory Visit: Payer: Medicare Other

## 2021-08-12 ENCOUNTER — Other Ambulatory Visit: Payer: Self-pay

## 2021-08-13 LAB — HM DIABETES EYE EXAM

## 2021-08-14 ENCOUNTER — Encounter: Payer: Self-pay | Admitting: *Deleted

## 2021-08-15 ENCOUNTER — Other Ambulatory Visit: Payer: Self-pay | Admitting: Internal Medicine

## 2021-08-15 DIAGNOSIS — E1169 Type 2 diabetes mellitus with other specified complication: Secondary | ICD-10-CM

## 2021-08-16 DIAGNOSIS — E785 Hyperlipidemia, unspecified: Secondary | ICD-10-CM

## 2021-08-16 DIAGNOSIS — E1169 Type 2 diabetes mellitus with other specified complication: Secondary | ICD-10-CM | POA: Diagnosis not present

## 2021-08-16 DIAGNOSIS — I10 Essential (primary) hypertension: Secondary | ICD-10-CM

## 2021-08-18 DIAGNOSIS — E1165 Type 2 diabetes mellitus with hyperglycemia: Secondary | ICD-10-CM | POA: Diagnosis not present

## 2021-08-27 ENCOUNTER — Other Ambulatory Visit: Payer: Self-pay | Admitting: Internal Medicine

## 2021-08-27 DIAGNOSIS — N3020 Other chronic cystitis without hematuria: Secondary | ICD-10-CM

## 2021-09-06 ENCOUNTER — Telehealth: Payer: Self-pay

## 2021-09-06 NOTE — Telephone Encounter (Signed)
Patient called need refill meclizine  ? ? ?Patient also said Humulin insulin still received in mail and no longer takes this. ? ?Patient call back # [phone]. ? ? ?Pharmacy: [facility] ? ?

## 2021-09-07 ENCOUNTER — Other Ambulatory Visit: Payer: Self-pay | Admitting: Internal Medicine

## 2021-09-07 DIAGNOSIS — I10 Essential (primary) hypertension: Secondary | ICD-10-CM

## 2021-09-09 ENCOUNTER — Encounter: Payer: Self-pay | Admitting: Internal Medicine

## 2021-09-09 ENCOUNTER — Ambulatory Visit (INDEPENDENT_AMBULATORY_CARE_PROVIDER_SITE_OTHER): Payer: Medicare Other | Admitting: Internal Medicine

## 2021-09-09 DIAGNOSIS — R42 Dizziness and giddiness: Secondary | ICD-10-CM | POA: Diagnosis not present

## 2021-09-09 MED ORDER — MECLIZINE HCL 25 MG PO TABS: 25.0000 mg | ORAL_TABLET | Freq: Three times a day (TID) | ORAL | 1 refills | Status: DC | PRN

## 2021-09-09 NOTE — Progress Notes (Signed)
?  ? ?Virtual Visit via Telephone Note  ? ?This visit type was conducted due to national recommendations for restrictions regarding the COVID-19 Pandemic (e.g. social distancing) in an effort to limit this patient's exposure and mitigate transmission in our community.  Due to her co-morbid illnesses, this patient is at least at moderate risk for complications without adequate follow up.  This format is felt to be most appropriate for this patient at this time.  The patient did not have access to video technology/had technical difficulties with video requiring transitioning to audio format only (telephone).  All issues noted in this document were discussed and addressed.  No physical exam could be performed with this format. ? ?Evaluation Performed:  Follow-up visit ? ?Date:  09/09/2021  ? ?ID:  Jessica Mclean, DOB Feb 26, 1946, MRN [id number] ? ?Patient Location: Home ?Provider Location: Office/Clinic ? ?Participants: Patient ?Location of Patient: Home ?Location of Provider: Telehealth ?Consent was obtain for visit to be over via telehealth. ?I verified that I am speaking with the correct person using two identifiers. ? ?PCP:  Lindell Spar, MD  ? ?Chief Complaint: Dizziness ? ?History of Present Illness:   ? ?Jessica Mclean is a 76 y.o. female who has a televisit for c/o dizziness for the last few days, which is worse with standing. She has history of vertigo, for which she had taken Meclizine. She also reports nausea, but denies any vomiting. Denies any diarrhea recently.  Denies any episode of hypoglycemia. ? ?The patient does not have symptoms concerning for COVID-19 infection (fever, chills, cough, or new shortness of breath).  ? ?Past Medical, Surgical, Social History, Allergies, and Medications have been Reviewed. ? ?Past Medical History:  ?Diagnosis Date  ? Bleeding disorder ([place])   ? Cervical cancer ([place]) 2010  ? Diabetes mellitus without complication ([place])   ? type 2  ? Diabetes mellitus without complication  ([place])   ? Heart murmur   ? High cholesterol   ? HLD (hyperlipidemia)   ? Hypertension   ? Mental disorder   ? depression; suicidal ideation in 2017  ? Sleep apnea   ? Vitamin D deficiency   ? ?Past Surgical History:  ?Procedure Laterality Date  ? BILATERAL OOPHORECTOMY    ? CESAREAN SECTION    ? x 4  ? CHOLECYSTECTOMY    ? COLOSTOMY    ? x2  ? CYSTOSCOPY WITH URETHRAL DILATATION N/A 11/15/2020  ? Procedure: CYSTOSCOPY WITH URETHRAL DILATION;  Surgeon: Cleon Gustin, MD;  Location: [facility];  Service: Urology;  Laterality: N/A;  ? SALPINGECTOMY    ? TONSILLECTOMY    ? TONSILLECTOMY AND ADENOIDECTOMY    ?  ? ?Current Meds  ?Medication Sig  ? ACCU-CHEK AVIVA PLUS test strip   ? Accu-Chek Softclix Lancets lancets SMARTSIG:Topical  ? amLODipine (NORVASC) 10 MG tablet TAKE 1 TABLET BY MOUTH  DAILY  ? aspirin-acetaminophen-caffeine (EXCEDRIN EXTRA STRENGTH) 250-250-65 MG tablet Take 2 tablets by mouth in the morning.  ? atorvastatin (LIPITOR) 80 MG tablet Take 1 tablet (80 mg total) by mouth daily.  ? Blood Glucose Monitoring Suppl (ACCU-CHEK GUIDE) w/Device KIT   ? calcium-vitamin D (OSCAL-500) 500-400 MG-UNIT tablet Take 1 tablet by mouth daily.  ? Continuous Blood Gluc Receiver (FREESTYLE LIBRE 2 READER) DEVI As directed  ? Continuous Blood Gluc Sensor (FREESTYLE LIBRE 2 SENSOR) MISC 1 Piece by Does not apply route every 14 (fourteen) days.  ? desonide (DESOWEN) 0.05 % ointment Apply 1 application topically 2 (two) times daily.  ?  glucosamine-chondroitin 500-400 MG tablet Take 2 tablets by mouth daily.  ? LANTUS SOLOSTAR 100 UNIT/ML Solostar Pen INJECT SUBCUTANEOUSLY 20  UNITS AT BEDTIME  ? meclizine (ANTIVERT) 25 MG tablet Take 1 tablet (25 mg total) by mouth 3 (three) times daily as needed for dizziness.  ? metFORMIN (GLUCOPHAGE) 1000 MG tablet TAKE 1 TABLET BY MOUTH  TWICE DAILY WITH MEALS  ? Multiple Vitamins-Minerals (PRESERVISION AREDS PO) Take 2 capsules by mouth daily.  ? nitrofurantoin (MACRODANTIN) 50 MG  capsule TAKE 1 CAPSULE BY MOUTH AT  BEDTIME  ? omega-3 acid ethyl esters (LOVAZA) 1 g capsule Take 2 g by mouth 2 (two) times daily.  ? OZEMPIC, 1 MG/DOSE, 4 MG/3ML SOPN INJECT SUBCUTANEOUSLY 1 MG EVERY WEEK  ? phenazopyridine (PYRIDIUM) 100 MG tablet Take 1 tablet (100 mg total) by mouth 3 (three) times daily as needed for pain.  ? polyethylene glycol powder (GLYCOLAX/MIRALAX) 17 GM/SCOOP powder Take 17 g by mouth daily as needed for mild constipation or moderate constipation.   ? tamsulosin (FLOMAX) 0.4 MG CAPS capsule Take 1 capsule (0.4 mg total) by mouth daily after supper.  ? telmisartan (MICARDIS) 40 MG tablet TAKE 1 TABLET BY MOUTH  DAILY  ? valACYclovir (VALTREX) 1000 MG tablet Take 1 g by mouth 2 (two) times daily as needed.  ?  ? ?Allergies:   Other and Clindamycin phosphate  ? ?ROS:   ?Please see the history of present illness.    ? ?All other systems reviewed and are negative. ? ? ?Labs/Other Tests and Data Reviewed:   ? ?Recent Labs: ?01/09/2021: TSH 1.300 ?03/23/2021: Hemoglobin 9.2; Platelets 333 ?06/18/2021: ALT 12; BUN 12; Creatinine, Ser 1.25; Potassium 4.4; Sodium 139  ? ?Recent Lipid Panel ?Lab Results  ?Component Value Date/Time  ? CHOL 190 06/18/2021 05:06 PM  ? TRIG 225 (H) 06/18/2021 05:06 PM  ? HDL 35 (L) 06/18/2021 05:06 PM  ? CHOLHDL 5.4 (H) 06/18/2021 05:06 PM  ? CHOLHDL 5.7 11/23/2019 03:04 PM  ? LDLCALC 115 (H) 06/18/2021 05:06 PM  ? ? ?Wt Readings from Last 3 Encounters:  ?06/18/21 148 lb (67.1 kg)  ?03/23/21 158 lb 11.7 oz (72 kg)  ?02/26/21 159 lb 6.4 oz (72.3 kg)  ?  ? ?ASSESSMENT & PLAN:   ? ?Vertigo ?Dizziness likely BPPV ?Meclizine PRN ?Avoid sudden positional changes ?Avoid skipping any meals and maintain adequate hydration ? ? ? ?Time:   ?Today, I have spent 9 minutes reviewing the chart, including problem list, medications, and with the patient with telehealth technology discussing the above problems. ? ? ?Medication Adjustments/Labs and Tests Ordered: ?Current medicines are  reviewed at length with the patient today.  Concerns regarding medicines are outlined above.  ? ?Tests Ordered: ?No orders of the defined types were placed in this encounter. ? ? ?Medication Changes: ?No orders of the defined types were placed in this encounter. ? ? ? ?Note: This dictation was prepared with Dragon dictation along with smaller phrase technology. Similar sounding words can be transcribed inadequately or may not be corrected upon review. Any transcriptional errors that result from this process are unintentional.  ?  ? ? ?Disposition:  Follow up  ?Signed, ?Lindell Spar, MD  ?09/09/2021 9:22 AM    ? ?[facility] ?[facility] ?

## 2021-09-09 NOTE — Telephone Encounter (Signed)
Meclizine is not on pt med list and we do not have humulin either  ? ?If patient needs meclizine may need to make a virtual visit  ?

## 2021-09-09 NOTE — Telephone Encounter (Signed)
Patient scheduled virtual appt ?

## 2021-10-07 ENCOUNTER — Telehealth: Payer: Self-pay

## 2021-10-07 ENCOUNTER — Other Ambulatory Visit: Payer: Self-pay | Admitting: *Deleted

## 2021-10-07 DIAGNOSIS — L309 Dermatitis, unspecified: Secondary | ICD-10-CM

## 2021-10-07 MED ORDER — DESONIDE 0.05 % EX OINT: 1.0000 "application " | TOPICAL_OINTMENT | Freq: Two times a day (BID) | CUTANEOUS | 1 refills | Status: DC

## 2021-10-07 NOTE — Telephone Encounter (Signed)
Patient called need med refill  desonide (DESOWEN) 0.05 %  (uses for her poison ivy when has  a break out)  Pharmacy: [facility]

## 2021-10-07 NOTE — Telephone Encounter (Signed)
Medication sent to pharmacy  

## 2021-10-08 ENCOUNTER — Encounter: Payer: Self-pay | Admitting: Internal Medicine

## 2021-10-08 ENCOUNTER — Ambulatory Visit (INDEPENDENT_AMBULATORY_CARE_PROVIDER_SITE_OTHER): Payer: Medicare Other | Admitting: Internal Medicine

## 2021-10-08 VITALS — BP 128/62 | HR 80 | Resp 18 | Ht 64.5 in | Wt 138.0 lb

## 2021-10-08 DIAGNOSIS — N3000 Acute cystitis without hematuria: Secondary | ICD-10-CM

## 2021-10-08 DIAGNOSIS — L309 Dermatitis, unspecified: Secondary | ICD-10-CM

## 2021-10-08 DIAGNOSIS — L237 Allergic contact dermatitis due to plants, except food: Secondary | ICD-10-CM | POA: Diagnosis not present

## 2021-10-08 DIAGNOSIS — N3020 Other chronic cystitis without hematuria: Secondary | ICD-10-CM | POA: Diagnosis not present

## 2021-10-08 DIAGNOSIS — E1169 Type 2 diabetes mellitus with other specified complication: Secondary | ICD-10-CM

## 2021-10-08 DIAGNOSIS — E785 Hyperlipidemia, unspecified: Secondary | ICD-10-CM

## 2021-10-08 DIAGNOSIS — I10 Essential (primary) hypertension: Secondary | ICD-10-CM | POA: Diagnosis not present

## 2021-10-08 MED ORDER — PHENAZOPYRIDINE HCL 100 MG PO TABS: 100.0000 mg | ORAL_TABLET | Freq: Three times a day (TID) | ORAL | 5 refills | Status: DC | PRN

## 2021-10-08 MED ORDER — PREDNISONE 20 MG PO TABS: 20.0000 mg | ORAL_TABLET | Freq: Every day | ORAL | 0 refills | Status: DC

## 2021-10-08 NOTE — Patient Instructions (Signed)
Please take Prednisone for poison ivy dermatitis.  Please continue taking medications as prescribed.  Please continue to follow low carb diet and ambulate as tolerated.

## 2021-10-09 ENCOUNTER — Telehealth: Payer: Self-pay | Admitting: Internal Medicine

## 2021-10-09 LAB — BASIC METABOLIC PANEL
BUN/Creatinine Ratio: 14 (ref 12–28)
BUN: 12 mg/dL (ref 8–27)
CO2: 21 mmol/L (ref 20–29)
Calcium: 9.6 mg/dL (ref 8.7–10.3)
Chloride: 102 mmol/L (ref 96–106)
Creatinine, Ser: 0.85 mg/dL (ref 0.57–1.00)
Glucose: 92 mg/dL (ref 70–99)
Potassium: 4.2 mmol/L (ref 3.5–5.2)
Sodium: 141 mmol/L (ref 134–144)
eGFR: 71 mL/min/{1.73_m2} (ref >59)

## 2021-10-09 LAB — HEMOGLOBIN A1C
Est. average glucose Bld gHb Est-mCnc: 131 mg/dL
Hgb A1c MFr Bld: 6.2 % — ABNORMAL HIGH (ref 4.8–5.6)

## 2021-10-09 LAB — UA/M W/RFLX CULTURE, ROUTINE

## 2021-10-09 LAB — SPECIMEN STATUS REPORT

## 2021-10-09 NOTE — Telephone Encounter (Signed)
Patient states she missed called from office.

## 2021-10-09 NOTE — Telephone Encounter (Signed)
Pt was calling in regards to labs advised with verbal understanding

## 2021-10-11 NOTE — Assessment & Plan Note (Signed)
Check UA and urine culture Would continue Macrobid ppx dose now Plan Pyridium as needed for chronic dysuria

## 2021-10-11 NOTE — Progress Notes (Signed)
Established Patient Office Visit  Subjective:  Patient ID: Jessica Mclean, female    DOB: 01-Aug-1945  Age: 76 y.o. MRN: [id number]  CC:  Chief Complaint  Patient presents with   Follow-up    4 month follow up HTN and DM pt has had poison ivy since 10-05-21 is all over arms legs and some on face     HPI Jessica Mclean is a 76 y.o. female with past medical history of HTN, DM2 on insulin, TIA, HLD and cervical ca. s/p radiation who presents for f/u of her chronic medical conditions.  She complains of rash for left bilateral UE and LE due to poison ivy exposure.  She admits that she is spends a lot of time in her yard.  She has been having intense itching.  She has tried applying topical steroid with mild relief.  Denies any fever, chills or lip swelling.  HTN: BP is well-controlled. Takes medications regularly. Patient denies headache, dizziness, chest pain, dyspnea or palpitations.   Type II DM: Her HbA1C has improved to 6.2 now. She has been using Ozempic and Lantus 20 units nightly. She has not required short-acting insulin for a long time.  She has freestyle [place], which shows in range blood glucose around 90% of the time now.  She denies any polyuria or polyphagia currently.   She complains of chronic suprapubic discomfort and incontinence. She has tried taking Pyridium for dysuria, but continues to have it. Denies any hematuria, flank or pelvic pain currently.     Past Medical History:  Diagnosis Date   Bleeding disorder ([place])    Cervical cancer ([place]) 2010   Diabetes mellitus without complication ([place])    type 2   Diabetes mellitus without complication ([place])    Heart murmur    High cholesterol    HLD (hyperlipidemia)    Hypertension    Mental disorder    depression; suicidal ideation in 2017   Sleep apnea    Vitamin D deficiency     Past Surgical History:  Procedure Laterality Date   BILATERAL OOPHORECTOMY     CESAREAN SECTION     x 4   CHOLECYSTECTOMY      COLOSTOMY     x2   CYSTOSCOPY WITH URETHRAL DILATATION N/A 11/15/2020   Procedure: CYSTOSCOPY WITH URETHRAL DILATION;  Surgeon: Cleon Gustin, MD;  Location: [facility];  Service: Urology;  Laterality: N/A;   SALPINGECTOMY     TONSILLECTOMY     TONSILLECTOMY AND ADENOIDECTOMY      Family History  Problem Relation Age of Onset   Cancer Father        kidney   Cancer Mother        lung   Other Brother        heart issues    Social History   Socioeconomic History   Marital status: Divorced    Spouse name: Not on file   Number of children: 3   Years of education: Not on file   Highest education level: Not on file  Occupational History   Occupation: retired  Tobacco Use   Smoking status: Never   Smokeless tobacco: Never  Vaping Use   Vaping Use: Never used  Substance and Sexual Activity   Alcohol use: Never   Drug use: Never   Sexual activity: Not Currently    Birth control/protection: Post-menopausal  Other Topics Concern   Not on file  Social History Narrative   ** Merged History Encounter **  Social Determinants of Health   Financial Resource Strain: Not on file  Food Insecurity: Not on file  Transportation Needs: Not on file  Physical Activity: Not on file  Stress: Not on file  Social Connections: Not on file  Intimate Partner Violence: Not on file    Outpatient Medications Prior to Visit  Medication Sig Dispense Refill   ACCU-CHEK AVIVA PLUS test strip      Accu-Chek Softclix Lancets lancets SMARTSIG:Topical     amLODipine (NORVASC) 10 MG tablet TAKE 1 TABLET BY MOUTH  DAILY 100 tablet 2   aspirin-acetaminophen-caffeine (EXCEDRIN EXTRA STRENGTH) 250-250-65 MG tablet Take 2 tablets by mouth in the morning.     atorvastatin (LIPITOR) 80 MG tablet Take 1 tablet (80 mg total) by mouth daily. 90 tablet 3   Blood Glucose Monitoring Suppl (ACCU-CHEK GUIDE) w/Device KIT      calcium-vitamin D (OSCAL-500) 500-400 MG-UNIT tablet Take 1 tablet by mouth daily.      Continuous Blood Gluc Receiver (FREESTYLE LIBRE 2 READER) DEVI As directed 1 each 0   Continuous Blood Gluc Sensor (FREESTYLE LIBRE 2 SENSOR) MISC 1 Piece by Does not apply route every 14 (fourteen) days. 2 each 3   desonide (DESOWEN) 0.05 % ointment Apply 1 application. topically 2 (two) times daily. 60 g 1   glucosamine-chondroitin 500-400 MG tablet Take 2 tablets by mouth daily.     LANTUS SOLOSTAR 100 UNIT/ML Solostar Pen INJECT SUBCUTANEOUSLY 20  UNITS AT BEDTIME 30 mL 3   meclizine (ANTIVERT) 25 MG tablet Take 1 tablet (25 mg total) by mouth 3 (three) times daily as needed for dizziness. 30 tablet 1   metFORMIN (GLUCOPHAGE) 1000 MG tablet TAKE 1 TABLET BY MOUTH  TWICE DAILY WITH MEALS 180 tablet 3   Multiple Vitamins-Minerals (PRESERVISION AREDS PO) Take 2 capsules by mouth daily.     nitrofurantoin (MACRODANTIN) 50 MG capsule TAKE 1 CAPSULE BY MOUTH AT  BEDTIME 90 capsule 0   omega-3 acid ethyl esters (LOVAZA) 1 g capsule Take 2 g by mouth 2 (two) times daily.     OZEMPIC, 1 MG/DOSE, 4 MG/3ML SOPN INJECT SUBCUTANEOUSLY 1 MG EVERY WEEK 9 mL 3   polyethylene glycol powder (GLYCOLAX/MIRALAX) 17 GM/SCOOP powder Take 17 g by mouth daily as needed for mild constipation or moderate constipation.      tamsulosin (FLOMAX) 0.4 MG CAPS capsule Take 1 capsule (0.4 mg total) by mouth daily after supper. 30 capsule 11   telmisartan (MICARDIS) 40 MG tablet TAKE 1 TABLET BY MOUTH  DAILY 90 tablet 3   valACYclovir (VALTREX) 1000 MG tablet Take 1 g by mouth 2 (two) times daily as needed.     phenazopyridine (PYRIDIUM) 100 MG tablet Take 1 tablet (100 mg total) by mouth 3 (three) times daily as needed for pain. 90 tablet 0   No facility-administered medications prior to visit.    Allergies  Allergen Reactions   Other     Nickel staples   Clindamycin Phosphate Nausea And Vomiting    ROS Review of Systems  Constitutional:  Negative for chills and fever.  HENT:  Negative for congestion, sinus  pressure, sinus pain and sore throat.   Eyes:  Negative for pain and discharge.  Respiratory:  Negative for cough and shortness of breath.   Cardiovascular:  Negative for chest pain and palpitations.  Gastrointestinal:  Negative for diarrhea, nausea and vomiting.  Endocrine: Negative for polydipsia and polyuria.  Genitourinary:  Positive for dysuria and urgency. Negative for flank  pain, frequency and hematuria.  Musculoskeletal:  Negative for neck pain and neck stiffness.  Skin:  Positive for rash.  Neurological:  Negative for dizziness, syncope, speech difficulty, weakness and numbness.  Psychiatric/Behavioral:  Negative for agitation and behavioral problems.      Objective:    Physical Exam Vitals reviewed.  Constitutional:      General: She is not in acute distress.    Appearance: She is not diaphoretic.  HENT:     Head: Normocephalic and atraumatic.     Nose: Nose normal.     Mouth/Throat:     Mouth: Mucous membranes are moist.  Eyes:     General: No scleral icterus.    Extraocular Movements: Extraocular movements intact.  Cardiovascular:     Rate and Rhythm: Normal rate and regular rhythm.     Pulses: Normal pulses.     Heart sounds: Normal heart sounds. No murmur heard. Pulmonary:     Breath sounds: Normal breath sounds. No wheezing or rales.  Abdominal:     Palpations: Abdomen is soft.     Tenderness: There is abdominal tenderness (Mild, suprapubic).  Musculoskeletal:     Cervical back: Neck supple. No tenderness.     Right lower leg: No edema.     Left lower leg: No edema.  Skin:    General: Skin is warm.     Findings: Rash (Maculopapular rash over erythematous base) present. No bruising.  Neurological:     General: No focal deficit present.     Mental Status: She is alert and oriented to person, place, and time.     Sensory: No sensory deficit.     Motor: No weakness.  Psychiatric:        Mood and Affect: Mood normal.        Behavior: Behavior normal.     BP 128/62 (BP Location: Left Arm, Patient Position: Sitting, Cuff Size: Normal)   Pulse 80   Resp 18   Ht 5' 4.5" (1.638 m)   Wt 138 lb (62.6 kg)   LMP  (LMP Unknown)   SpO2 99%   BMI 23.32 kg/m  Wt Readings from Last 3 Encounters:  10/08/21 138 lb (62.6 kg)  06/18/21 148 lb (67.1 kg)  03/23/21 158 lb 11.7 oz (72 kg)    Lab Results  Component Value Date   TSH 1.300 01/09/2021   Lab Results  Component Value Date   WBC 6.0 03/23/2021   HGB 9.2 (L) 03/23/2021   HCT 30.0 (L) 03/23/2021   MCV 79.8 (L) 03/23/2021   PLT 333 03/23/2021   Lab Results  Component Value Date   NA 141 10/08/2021   K 4.2 10/08/2021   CO2 21 10/08/2021   GLUCOSE 92 10/08/2021   BUN 12 10/08/2021   CREATININE 0.85 10/08/2021   BILITOT 0.5 06/18/2021   ALKPHOS 87 06/18/2021   AST 15 06/18/2021   ALT 12 06/18/2021   PROT 7.5 06/18/2021   ALBUMIN 4.8 (H) 06/18/2021   CALCIUM 9.6 10/08/2021   ANIONGAP 11 03/23/2021   EGFR 71 10/08/2021   Lab Results  Component Value Date   CHOL 190 06/18/2021   Lab Results  Component Value Date   HDL 35 (L) 06/18/2021   Lab Results  Component Value Date   LDLCALC 115 (H) 06/18/2021   Lab Results  Component Value Date   TRIG 225 (H) 06/18/2021   Lab Results  Component Value Date   CHOLHDL 5.4 (H) 06/18/2021   Lab Results  Component Value Date   HGBA1C 6.2 (H) 10/08/2021      Assessment & Plan:   Problem List Items Addressed This Visit       Cardiovascular and Mediastinum   Essential hypertension    BP Readings from Last 1 Encounters:  10/08/21 128/62  Well-controlled with telmisartan 40 mg daily and amlodipine 10 mg daily now Counseled for compliance with the medications Advised DASH diet and moderate exercise/walking as tolerated        Endocrine   Type 2 diabetes mellitus with hyperlipidemia ([place]) - Primary    Lab Results  Component Value Date   HGBA1C 6.2 (H) 10/08/2021  On Lantus 16 U qHS and Ozempic - now  improved Advised to follow diabetic diet On ARB and statin Diabetic eye exam: Advised to follow up with Ophthalmology for diabetic eye exam      Relevant Orders   Basic Metabolic Panel (BMET) (Completed)   Hemoglobin A1c (Completed)     Genitourinary   Chronic cystitis    Check UA and urine culture Would continue Macrobid ppx dose now Plan Pyridium as needed for chronic dysuria       Relevant Medications   phenazopyridine (PYRIDIUM) 100 MG tablet   Other Relevant Orders   UA/M w/rflx Culture, Routine (Completed)   Specimen status report (Completed)   Other Visit Diagnoses     Poison ivy dermatitis     Since she has widespread poison ivy dermatitis, will start short course of oral prednisone [place] ointment prescribed for itching   Relevant Medications   predniSONE (DELTASONE) 20 MG tablet       Meds ordered this encounter  Medications   predniSONE (DELTASONE) 20 MG tablet    Sig: Take 1 tablet (20 mg total) by mouth daily with breakfast.    Dispense:  5 tablet    Refill:  0   phenazopyridine (PYRIDIUM) 100 MG tablet    Sig: Take 1 tablet (100 mg total) by mouth 3 (three) times daily as needed for pain (Dysuria).    Dispense:  90 tablet    Refill:  5    Follow-up: Return in about 4 months (around 02/08/2022) for Annual physical.    Lindell Spar, MD

## 2021-10-11 NOTE — Assessment & Plan Note (Signed)
BP Readings from Last 1 Encounters:  10/08/21 128/62   Well-controlled with telmisartan 40 mg daily and amlodipine 10 mg daily now Counseled for compliance with the medications Advised DASH diet and moderate exercise/walking as tolerated

## 2021-10-11 NOTE — Assessment & Plan Note (Signed)
Lab Results  Component Value Date   HGBA1C 6.2 (H) 10/08/2021   On Lantus 16 U qHS and Ozempic - now improved Advised to follow diabetic diet On ARB and statin Diabetic eye exam: Advised to follow up with Ophthalmology for diabetic eye exam

## 2021-10-15 ENCOUNTER — Telehealth: Payer: Self-pay | Admitting: Internal Medicine

## 2021-10-15 NOTE — Telephone Encounter (Signed)
Pt advised was sent to [facility]  with verbal understanding

## 2021-10-15 NOTE — Telephone Encounter (Signed)
Pt called and LMOM she is still having a lot of bladder pain and was told Dr [name] was calling in medication.  She is asking for follow up on this.  Please call her.

## 2021-10-16 ENCOUNTER — Ambulatory Visit: Payer: Medicare Other | Admitting: Internal Medicine

## 2021-10-17 DIAGNOSIS — E1165 Type 2 diabetes mellitus with hyperglycemia: Secondary | ICD-10-CM | POA: Diagnosis not present

## 2021-10-30 ENCOUNTER — Telehealth: Payer: Self-pay | Admitting: Internal Medicine

## 2021-10-30 NOTE — Telephone Encounter (Signed)
Please let her know the provider is out of the office can schedule a virtual visit with a different provider this week or schedule a virtual visit to discuss with provider upon his return

## 2021-10-30 NOTE — Telephone Encounter (Signed)
Pt called stating she has been taking '1000mg'$  of metformin 2x daily. Since she has been on the ozempic she has had a couple incidents where she has had low blood sugar & had to get up & take something for this. Wants to know if it would be a good idea to lower the dosage of metformin? Please advise

## 2021-11-05 NOTE — Progress Notes (Signed)
This encounter was created in error - please disregard.

## 2021-11-06 MED ORDER — TRIAMCINOLONE ACETONIDE 0.025 % EX CREA: 1.0000 | TOPICAL_CREAM | Freq: Two times a day (BID) | CUTANEOUS | 0 refills | Status: DC

## 2021-11-06 NOTE — Addendum Note (Signed)
Addended byIhor Dow on: 11/06/2021 05:37 PM   Modules accepted: Orders

## 2021-11-11 ENCOUNTER — Telehealth: Payer: Medicare Other | Admitting: Internal Medicine

## 2021-11-12 ENCOUNTER — Ambulatory Visit (INDEPENDENT_AMBULATORY_CARE_PROVIDER_SITE_OTHER): Payer: Medicare Other | Admitting: Internal Medicine

## 2021-11-12 ENCOUNTER — Encounter: Payer: Self-pay | Admitting: Internal Medicine

## 2021-11-12 DIAGNOSIS — L309 Dermatitis, unspecified: Secondary | ICD-10-CM | POA: Diagnosis not present

## 2021-11-12 DIAGNOSIS — E785 Hyperlipidemia, unspecified: Secondary | ICD-10-CM | POA: Diagnosis not present

## 2021-11-12 DIAGNOSIS — E860 Dehydration: Secondary | ICD-10-CM

## 2021-11-12 DIAGNOSIS — E1169 Type 2 diabetes mellitus with other specified complication: Secondary | ICD-10-CM

## 2021-11-12 MED ORDER — TRIAMCINOLONE ACETONIDE 0.025 % EX CREA: 1.0000 | TOPICAL_CREAM | Freq: Two times a day (BID) | CUTANEOUS | 0 refills | Status: DC

## 2021-11-17 DIAGNOSIS — E1165 Type 2 diabetes mellitus with hyperglycemia: Secondary | ICD-10-CM | POA: Diagnosis not present

## 2021-12-02 ENCOUNTER — Telehealth: Payer: Self-pay | Admitting: Internal Medicine

## 2021-12-02 ENCOUNTER — Encounter: Payer: Self-pay | Admitting: Internal Medicine

## 2021-12-02 ENCOUNTER — Ambulatory Visit (INDEPENDENT_AMBULATORY_CARE_PROVIDER_SITE_OTHER): Payer: Medicare Other | Admitting: Internal Medicine

## 2021-12-02 ENCOUNTER — Telehealth: Payer: Self-pay

## 2021-12-02 VITALS — BP 118/70 | HR 85 | Ht 64.5 in | Wt 119.0 lb

## 2021-12-02 DIAGNOSIS — R42 Dizziness and giddiness: Secondary | ICD-10-CM | POA: Insufficient documentation

## 2021-12-02 DIAGNOSIS — I10 Essential (primary) hypertension: Secondary | ICD-10-CM | POA: Diagnosis not present

## 2021-12-02 DIAGNOSIS — E785 Hyperlipidemia, unspecified: Secondary | ICD-10-CM

## 2021-12-02 DIAGNOSIS — R197 Diarrhea, unspecified: Secondary | ICD-10-CM

## 2021-12-02 DIAGNOSIS — Z1211 Encounter for screening for malignant neoplasm of colon: Secondary | ICD-10-CM | POA: Diagnosis not present

## 2021-12-02 DIAGNOSIS — E1169 Type 2 diabetes mellitus with other specified complication: Secondary | ICD-10-CM

## 2021-12-02 DIAGNOSIS — Z8541 Personal history of malignant neoplasm of cervix uteri: Secondary | ICD-10-CM | POA: Insufficient documentation

## 2021-12-02 NOTE — Assessment & Plan Note (Signed)
BP Readings from Last 1 Encounters:  12/02/21 118/70   Well-controlled with telmisartan 40 mg daily and amlodipine 10 mg daily now Dizziness could be due to duplicate medications, advised to remove lisinopril from home medication Counseled for compliance with the medications Advised DASH diet and moderate exercise/walking as tolerated

## 2021-12-02 NOTE — Telephone Encounter (Signed)
Patient called asking for nurse to return her call. Called patient back unable to get in touch with her.  Call back # [phone].

## 2021-12-02 NOTE — Assessment & Plan Note (Signed)
Could be due to dehydration and duplicate and antihypertensives Advised to remove lisinopril from home medicines Maintain adequate hydration Meclizine as needed for now

## 2021-12-02 NOTE — Telephone Encounter (Signed)
Pt called back stating she is on amLODipine (NORVASC) 10 MG tablet. She is wanting to know if she needs to keep taking this?

## 2021-12-02 NOTE — Assessment & Plan Note (Signed)
S/p radiation Has chronic cystitis Referred to Ob/Gyn for PAP smear

## 2021-12-02 NOTE — Assessment & Plan Note (Signed)
Lab Results  Component Value Date   HGBA1C 6.2 (H) 10/08/2021   On Lantus 10 U qHS and Ozempic - now improved On Metformin 1000 mg BID Due to episode of hypoglycemia and tightly controlled DM, had advised to decrease Lantus to 12 U qHS, but now on 10 U qHS Advised to follow diabetic diet On ARB and statin Diabetic eye exam: Advised to follow up with Ophthalmology for diabetic eye exam

## 2021-12-02 NOTE — Telephone Encounter (Signed)
NA NVM when attempting to call patient

## 2021-12-02 NOTE — Progress Notes (Signed)
Acute Office Visit  Subjective:    Patient ID: Jessica Mclean, female    DOB: 1945/06/05, 76 y.o.   MRN: [id number]  Chief Complaint  Patient presents with   Blood Pressure Check    BP was low last night 98/58. Feeling tingling, numb, and shaky since this morning    HPI Patient is in today for complaint of dizziness, fatigue and generalized weakness since this morning.  Her BP was 98/58 last night.  Her BP was WNL today.  Of note, she has had intermittent diarrhea/loose BM X 10 days.  She also reports seeing lisinopril in her medication list, which is not recently treated prescribed by [place].  She also takes amlodipine and telmisartan.  She denies any chest pain or palpitations.  Her blood glucose has been above 100 now with Ozempic 1 mg dose and Lantus 10 units nightly only.  She denies any fever, chills, melena or hematochezia.  She has chronic nausea, but denies any recent vomiting.  Past Medical History:  Diagnosis Date   Bleeding disorder ([place])    Cervical cancer ([place]) 2010   Diabetes mellitus without complication ([place])    type 2   Diabetes mellitus without complication ([place])    Heart murmur    High cholesterol    HLD (hyperlipidemia)    Hypertension    Mental disorder    depression; suicidal ideation in 2017   Sleep apnea    Vitamin D deficiency     Past Surgical History:  Procedure Laterality Date   BILATERAL OOPHORECTOMY     CESAREAN SECTION     x 4   CHOLECYSTECTOMY     COLOSTOMY     x2   CYSTOSCOPY WITH URETHRAL DILATATION N/A 11/15/2020   Procedure: CYSTOSCOPY WITH URETHRAL DILATION;  Surgeon: Cleon Gustin, MD;  Location: [facility];  Service: Urology;  Laterality: N/A;   SALPINGECTOMY     TONSILLECTOMY     TONSILLECTOMY AND ADENOIDECTOMY      Family History  Problem Relation Age of Onset   Cancer Father        kidney   Cancer Mother        lung   Other Brother        heart issues    Social History   Socioeconomic History   Marital status:  Divorced    Spouse name: Not on file   Number of children: 3   Years of education: Not on file   Highest education level: Not on file  Occupational History   Occupation: retired  Tobacco Use   Smoking status: Never   Smokeless tobacco: Never  Vaping Use   Vaping Use: Never used  Substance and Sexual Activity   Alcohol use: Never   Drug use: Never   Sexual activity: Not Currently    Birth control/protection: Post-menopausal  Other Topics Concern   Not on file  Social History Narrative   ** Merged History Encounter **       Social Determinants of Health   Financial Resource Strain: Medium Risk (07/31/2020)   Overall Financial Resource Strain (CARDIA)    Difficulty of Paying Living Expenses: Somewhat hard  Food Insecurity: Food Insecurity Present (07/31/2020)   Hunger Vital Sign    Worried About [place] in the Last Year: Sometimes true    Ran Out of Food in the Last Year: Sometimes true  Transportation Needs: No Transportation Needs (07/31/2020)   PRAPARE - Transportation    Lack of Transportation (  Medical): No    Lack of Transportation (Non-Medical): No  Physical Activity: Insufficiently Active (07/31/2020)   Exercise Vital Sign    Days of Exercise per Week: 4 days    Minutes of Exercise per Session: 30 min  Stress: No Stress Concern Present (07/31/2020)   [place]    Feeling of Stress : Only a little  Social Connections: Socially Isolated (07/31/2020)   Social Connection and Isolation Panel [NHANES]    Frequency of Communication with Friends and Family: More than three times a week    Frequency of Social Gatherings with Friends and Family: Twice a week    Attends Religious Services: Never    Marine scientist or Organizations: No    Attends Archivist Meetings: Never    Marital Status: Widowed  Human resources officer Violence: Not on file    Outpatient Medications Prior to Visit   Medication Sig Dispense Refill   ACCU-CHEK AVIVA PLUS test strip      Accu-Chek Softclix Lancets lancets SMARTSIG:Topical     amLODipine (NORVASC) 10 MG tablet TAKE 1 TABLET BY MOUTH  DAILY 100 tablet 2   aspirin-acetaminophen-caffeine (EXCEDRIN EXTRA STRENGTH) 250-250-65 MG tablet Take 2 tablets by mouth in the morning.     atorvastatin (LIPITOR) 80 MG tablet Take 1 tablet (80 mg total) by mouth daily. 90 tablet 3   Blood Glucose Monitoring Suppl (ACCU-CHEK GUIDE) w/Device KIT      calcium-vitamin D (OSCAL-500) 500-400 MG-UNIT tablet Take 1 tablet by mouth daily.     Continuous Blood Gluc Receiver (FREESTYLE LIBRE 2 READER) DEVI As directed 1 each 0   Continuous Blood Gluc Sensor (FREESTYLE LIBRE 2 SENSOR) MISC 1 Piece by Does not apply route every 14 (fourteen) days. 2 each 3   glucosamine-chondroitin 500-400 MG tablet Take 2 tablets by mouth daily.     LANTUS SOLOSTAR 100 UNIT/ML Solostar Pen INJECT SUBCUTANEOUSLY 20  UNITS AT BEDTIME 30 mL 3   meclizine (ANTIVERT) 25 MG tablet Take 1 tablet (25 mg total) by mouth 3 (three) times daily as needed for dizziness. 30 tablet 1   metFORMIN (GLUCOPHAGE) 1000 MG tablet TAKE 1 TABLET BY MOUTH  TWICE DAILY WITH MEALS 180 tablet 3   Multiple Vitamins-Minerals (PRESERVISION AREDS PO) Take 2 capsules by mouth daily.     nitrofurantoin (MACRODANTIN) 50 MG capsule TAKE 1 CAPSULE BY MOUTH AT  BEDTIME 90 capsule 0   omega-3 acid ethyl esters (LOVAZA) 1 g capsule Take 2 g by mouth 2 (two) times daily.     OZEMPIC, 1 MG/DOSE, 4 MG/3ML SOPN INJECT SUBCUTANEOUSLY 1 MG EVERY WEEK 9 mL 3   phenazopyridine (PYRIDIUM) 100 MG tablet Take 1 tablet (100 mg total) by mouth 3 (three) times daily as needed for pain (Dysuria). 90 tablet 5   polyethylene glycol powder (GLYCOLAX/MIRALAX) 17 GM/SCOOP powder Take 17 g by mouth daily as needed for mild constipation or moderate constipation.      tamsulosin (FLOMAX) 0.4 MG CAPS capsule Take 1 capsule (0.4 mg total) by mouth  daily after supper. 30 capsule 11   telmisartan (MICARDIS) 40 MG tablet TAKE 1 TABLET BY MOUTH  DAILY 90 tablet 3   triamcinolone (KENALOG) 0.025 % cream Apply 1 Application topically 2 (two) times daily. 454 g 0   valACYclovir (VALTREX) 1000 MG tablet Take 1 g by mouth 2 (two) times daily as needed. (Patient not taking: Reported on 12/02/2021)     No facility-administered  medications prior to visit.    Allergies  Allergen Reactions   Other     Nickel staples   Clindamycin Phosphate Nausea And Vomiting    Review of Systems  Constitutional:  Positive for fatigue and unexpected weight change. Negative for chills and fever.  HENT:  Negative for congestion, sinus pressure, sinus pain and sore throat.   Eyes:  Negative for pain and discharge.  Respiratory:  Negative for cough and shortness of breath.   Cardiovascular:  Negative for chest pain and palpitations.  Gastrointestinal:  Positive for diarrhea and nausea. Negative for blood in stool and vomiting.  Endocrine: Negative for polydipsia and polyuria.  Genitourinary:  Positive for dysuria and urgency. Negative for flank pain, frequency and hematuria.  Musculoskeletal:  Negative for neck pain and neck stiffness.  Skin:  Positive for rash.  Neurological:  Positive for dizziness and weakness. Negative for speech difficulty.  Psychiatric/Behavioral:  Negative for agitation and behavioral problems.        Objective:    Physical Exam Vitals reviewed.  Constitutional:      General: She is not in acute distress.    Appearance: She is not diaphoretic.  HENT:     Head: Normocephalic and atraumatic.     Nose: Nose normal.     Mouth/Throat:     Mouth: Mucous membranes are moist.  Eyes:     General: No scleral icterus.    Extraocular Movements: Extraocular movements intact.  Cardiovascular:     Rate and Rhythm: Normal rate and regular rhythm.     Pulses: Normal pulses.     Heart sounds: Normal heart sounds. No murmur  heard. Pulmonary:     Breath sounds: Normal breath sounds. No wheezing or rales.  Abdominal:     Palpations: Abdomen is soft.     Tenderness: There is abdominal tenderness (Mild, generalized).  Musculoskeletal:     Cervical back: Neck supple. No tenderness.     Right lower leg: No edema.     Left lower leg: No edema.  Skin:    General: Skin is warm.     Findings: Rash (Maculopapular rash over erythematous base) present. No bruising.  Neurological:     General: No focal deficit present.     Mental Status: She is alert and oriented to person, place, and time.     Sensory: No sensory deficit.     Motor: No weakness.  Psychiatric:        Mood and Affect: Mood normal.        Behavior: Behavior normal.     BP 118/70   Pulse 85   Ht 5' 4.5" (1.638 m)   Wt 119 lb (54 kg)   LMP  (LMP Unknown)   SpO2 99%   BMI 20.11 kg/m  Wt Readings from Last 3 Encounters:  12/02/21 119 lb (54 kg)  10/08/21 138 lb (62.6 kg)  06/18/21 148 lb (67.1 kg)        Assessment & Plan:   Problem List Items Addressed This Visit       Cardiovascular and Mediastinum   Essential hypertension    BP Readings from Last 1 Encounters:  12/02/21 118/70  Well-controlled with telmisartan 40 mg daily and amlodipine 10 mg daily now Dizziness could be due to duplicate medications, advised to remove lisinopril from home medication Counseled for compliance with the medications Advised DASH diet and moderate exercise/walking as tolerated        Endocrine   Type 2 diabetes mellitus  with hyperlipidemia ([place]) - Primary    Lab Results  Component Value Date   HGBA1C 6.2 (H) 10/08/2021  On Lantus 10 U qHS and Ozempic - now improved On Metformin 1000 mg BID Due to episode of hypoglycemia and tightly controlled DM, had advised to decrease Lantus to 12 U qHS, but now on 10 U qHS Advised to follow diabetic diet On ARB and statin Diabetic eye exam: Advised to follow up with Ophthalmology for diabetic eye exam         Other   Dizziness    Could be due to dehydration and duplicate and antihypertensives Advised to remove lisinopril from home medicines Maintain adequate hydration Meclizine as needed for now      History of cervical cancer    S/p radiation Has chronic cystitis Referred to Ob/Gyn for PAP smear      Relevant Orders   Ambulatory referral to Obstetrics / Gynecology   Other Visit Diagnoses     Diarrhea of presumed infectious origin    Check GI stool profile She is on chronic antibiotic for chronic cystitis Maintain adequate hydration for now   Relevant Orders   Ambulatory referral to Gastroenterology   GI Profile, Stool, PCR   Colon cancer screening       Relevant Orders   Ambulatory referral to Gastroenterology        No orders of the defined types were placed in this encounter.    Lindell Spar, MD

## 2021-12-02 NOTE — Telephone Encounter (Signed)
Patient advised with verbal understanding  

## 2021-12-02 NOTE — Patient Instructions (Addendum)
Please continue taking medications as prescribed.  Please check your home medications and remove Lisinopril if you find it.  Please continue to maintain at least 64 ounces of fluid in a day.  Please take Meclizine as needed for dizziness.

## 2021-12-03 ENCOUNTER — Encounter (INDEPENDENT_AMBULATORY_CARE_PROVIDER_SITE_OTHER): Payer: Self-pay | Admitting: *Deleted

## 2021-12-03 ENCOUNTER — Encounter: Payer: Self-pay | Admitting: Internal Medicine

## 2021-12-04 DIAGNOSIS — R197 Diarrhea, unspecified: Secondary | ICD-10-CM | POA: Diagnosis not present

## 2021-12-07 LAB — GI PROFILE, STOOL, PCR

## 2021-12-18 DIAGNOSIS — E1165 Type 2 diabetes mellitus with hyperglycemia: Secondary | ICD-10-CM | POA: Diagnosis not present

## 2021-12-19 ENCOUNTER — Telehealth: Payer: Self-pay | Admitting: Internal Medicine

## 2021-12-19 NOTE — Telephone Encounter (Signed)
Pt called wanting lab results  

## 2021-12-19 NOTE — Telephone Encounter (Signed)
Patient advised with verbal understanding  

## 2021-12-23 ENCOUNTER — Other Ambulatory Visit (HOSPITAL_COMMUNITY)
Admission: RE | Admit: 2021-12-23 | Discharge: 2021-12-23 | Disposition: A | Discharge status: Home / Self Care | Payer: Medicare Other | Source: Ambulatory Visit | Attending: Obstetrics & Gynecology | Admitting: Obstetrics & Gynecology

## 2021-12-23 ENCOUNTER — Ambulatory Visit (INDEPENDENT_AMBULATORY_CARE_PROVIDER_SITE_OTHER): Payer: Medicare Other | Admitting: Obstetrics & Gynecology

## 2021-12-23 ENCOUNTER — Encounter: Payer: Self-pay | Admitting: Obstetrics & Gynecology

## 2021-12-23 VITALS — BP 124/76 | HR 92 | Ht 64.5 in

## 2021-12-23 DIAGNOSIS — Z01419 Encounter for gynecological examination (general) (routine) without abnormal findings: Secondary | ICD-10-CM | POA: Insufficient documentation

## 2021-12-23 DIAGNOSIS — R4189 Other symptoms and signs involving cognitive functions and awareness: Secondary | ICD-10-CM

## 2021-12-23 DIAGNOSIS — Z1151 Encounter for screening for human papillomavirus (HPV): Secondary | ICD-10-CM | POA: Insufficient documentation

## 2021-12-23 DIAGNOSIS — Z8541 Personal history of malignant neoplasm of cervix uteri: Secondary | ICD-10-CM | POA: Insufficient documentation

## 2021-12-23 NOTE — Progress Notes (Signed)
   WELL-WOMAN EXAMINATION Patient name: Jessica Mclean MRN [id number]  Date of birth: 09-Feb-1946 Chief Complaint:   Gynecologic Exam (Feels lightheaded today)  History of Present Illness:   Zo Loudon is a 76 y.o. G88P0003 PM female being seen today for a routine well-woman exam.   Prior h/o cervical cancer- diagnosed in [place] Today she notes vaginal pain- denies discharge or itching.    Notes urinary incontinence.   No LMP recorded (lmp unknown). Patient is postmenopausal.   Last pap 2019.  Last mammogram: 05/2021. Last colonoscopy: pt unable to confirm last colonoscopy     12/02/2021   10:47 AM 11/12/2021    3:20 PM 10/08/2021   10:55 AM 09/09/2021    9:04 AM 06/18/2021    3:16 PM  Depression screen PHQ 2/9  Decreased Interest  0 0 0 0  Down, Depressed, Hopeless 2 0 0 0 0  PHQ - 2 Score 2 0 0 0 0  Altered sleeping 3      Tired, decreased energy 3      Change in appetite 2      Feeling bad or failure about yourself  0      Trouble concentrating 0      Moving slowly or fidgety/restless 1      Suicidal thoughts 0      PHQ-9 Score 11          Review of Systems:   Pertinent items are noted in HPI Denies any headaches, blurred vision, fatigue, shortness of breath, chest pain, abdominal pain, bowel movements, urination, or intercourse unless otherwise stated above.  Pertinent History Reviewed:  Reviewed past medical,surgical, social and family history.  Reviewed problem list, medications and allergies. Physical Assessment:   Vitals:   12/23/21 1319  BP: 124/76  Pulse: 92  Height: 5' 4.5" (1.638 m)  Body mass index is 20.11 kg/m.        Physical Examination:   General appearance - well appearing, and in no distress  Mental status - alert, cognif  Psych:  She has a normal mood and affect  Skin - warm and dry, normal color, no suspicious lesions noted  Chest - effort normal, all lung fields clear to auscultation bilaterally  Heart - normal rate and regular  rhythm  Neck:  midline trachea, no thyromegaly or nodules  Breasts - breasts appear normal, no suspicious masses, no skin or nipple changes or  axillary nodes  Abdomen - soft, nontender, nondistended, no masses or organomegaly  Pelvic - VULVA: normal appearing vulva with no masses, tenderness or lesions  VAGINA: white flat mucosa with atrophic changes noted,  adhesive disease appreciated, cervix not well visualized.  No masses or tenderness noted on bimanual exam  Thin prep pap is done with HR HPV cotesting    Rectal - external hemorrhoid noted  Extremities:  No swelling or varicosities noted  Chaperone:  Dr. Erskine Emery      Assessment & Plan:  1) Well-Woman Exam -pap collected due to prior history -screening exams up to date -due to prior history of cervical cancer will continue with pap screening, '[]'$  plan to obtain records regarding prior treatment and type of cancer  Meds: No orders of the defined types were placed in this encounter.   Follow-up: Return in about 1 year (around 12/24/2022) for Annual, '[]'$  records from [place] .   Janyth Pupa, DO Attending [place], [facility] for [employer], [place]

## 2021-12-26 ENCOUNTER — Ambulatory Visit: Payer: Medicare Other | Admitting: Gastroenterology

## 2022-01-06 LAB — CYTOLOGY - PAP
Comment: NEGATIVE
Diagnosis: UNDETERMINED — AB
High risk HPV: NEGATIVE

## 2022-02-15 ENCOUNTER — Other Ambulatory Visit: Payer: Self-pay | Admitting: Internal Medicine

## 2022-02-15 DIAGNOSIS — I10 Essential (primary) hypertension: Secondary | ICD-10-CM

## 2022-03-04 ENCOUNTER — Telehealth: Payer: Self-pay

## 2022-03-04 NOTE — Telephone Encounter (Signed)
Patient received her last covid shot ended up in the ER could not stand up had bad vertigo last year.  Patient is asking can she go ahead and get her covid shot this year, will it be okay? Please contact patient at [phone].

## 2022-03-04 NOTE — Telephone Encounter (Signed)
Pt advised with verbal understanding  °

## 2022-03-11 ENCOUNTER — Other Ambulatory Visit: Payer: Self-pay | Admitting: *Deleted

## 2022-03-11 MED ORDER — FREESTYLE LIBRE 2 READER DEVI: 0 refills | Status: DC

## 2022-03-11 MED ORDER — FREESTYLE LIBRE 2 SENSOR MISC: 1.0000 | 3 refills | Status: DC

## 2022-03-13 ENCOUNTER — Encounter: Payer: Self-pay | Admitting: Family Medicine

## 2022-03-13 ENCOUNTER — Ambulatory Visit (INDEPENDENT_AMBULATORY_CARE_PROVIDER_SITE_OTHER): Payer: Medicare Other | Admitting: Family Medicine

## 2022-03-13 DIAGNOSIS — N3020 Other chronic cystitis without hematuria: Secondary | ICD-10-CM

## 2022-03-13 DIAGNOSIS — N3090 Cystitis, unspecified without hematuria: Secondary | ICD-10-CM

## 2022-03-13 MED ORDER — SULFAMETHOXAZOLE-TRIMETHOPRIM 800-160 MG PO TABS: 1.0000 | ORAL_TABLET | Freq: Two times a day (BID) | ORAL | 0 refills | Status: AC

## 2022-03-13 MED ORDER — SULFAMETHOXAZOLE-TRIMETHOPRIM 800-160 MG PO TABS: 1.0000 | ORAL_TABLET | Freq: Two times a day (BID) | ORAL | 0 refills | Status: DC

## 2022-03-13 MED ORDER — NITROFURANTOIN MACROCRYSTAL 50 MG PO CAPS: 50.0000 mg | ORAL_CAPSULE | Freq: Every day | ORAL | 0 refills | Status: DC

## 2022-03-13 NOTE — Progress Notes (Signed)
Virtual Visit via Telephone Note   This visit type was conducted via telephone. This format is felt to be most appropriate for this patient at this time.  The patient did not have access to video technology/had technical difficulties with video requiring transitioning to audio format only (telephone).  All issues noted in this document were discussed and addressed.  No physical exam could be performed with this format.  Evaluation Performed:  Follow-up visit  Date:  03/13/2022   ID:  Jessica Mclean, DOB 11-Jul-1945, MRN [id number]  Patient Location: Home Provider Location: Clinic  Participants: Patient Location of Patient: Home Location of Provider: Clinic Consent was obtain for visit to be over via telehealth. I verified that I am speaking with the correct person using two identifiers.  PCP:  Lindell Spar, MD   Chief Complaint: Urinary symptoms  History of Present Illness:    Jessica Mclean is a 76 y.o. female with complaints of low back pain, urgency, frequency, and pain with urination.  The onset of symptoms on 03/12/2022.  She does have a history of urinary incontinence and chronic cystitis with a history of cervical cancer.  She denies fever and chills and reports using over-the-counter Azo with minimal relief of her symptoms.  She was on prophylactic treatment with Macrobid 50 mg daily at bedtime and has completed treatment.    The patient does not have symptoms concerning for COVID-19 infection (fever, chills, cough, or new shortness of breath).   Past Medical, Surgical, Social History, Allergies, and Medications have been Reviewed.  Past Medical History:  Diagnosis Date   Bleeding disorder ([place])    Cervical cancer ([place]) 2010   Diabetes mellitus without complication ([place])    type 2   Diabetes mellitus without complication ([place])    Heart murmur    High cholesterol    HLD (hyperlipidemia)    Hypertension    Mental disorder    depression; suicidal ideation in  2017   Sleep apnea    Vitamin D deficiency    Past Surgical History:  Procedure Laterality Date   BILATERAL OOPHORECTOMY     CESAREAN SECTION     x 4   CHOLECYSTECTOMY     COLOSTOMY     x2   CYSTOSCOPY WITH URETHRAL DILATATION N/A 11/15/2020   Procedure: CYSTOSCOPY WITH URETHRAL DILATION;  Surgeon: Cleon Gustin, MD;  Location: [facility];  Service: Urology;  Laterality: N/A;   SALPINGECTOMY     TONSILLECTOMY     TONSILLECTOMY AND ADENOIDECTOMY       Current Meds  Medication Sig   ACCU-CHEK AVIVA PLUS test strip    Accu-Chek Softclix Lancets lancets SMARTSIG:Topical   amLODipine (NORVASC) 10 MG tablet TAKE 1 TABLET BY MOUTH  DAILY   aspirin-acetaminophen-caffeine (EXCEDRIN EXTRA STRENGTH) 250-250-65 MG tablet Take 2 tablets by mouth in the morning.   atorvastatin (LIPITOR) 80 MG tablet Take 1 tablet (80 mg total) by mouth daily.   Blood Glucose Monitoring Suppl (ACCU-CHEK GUIDE) w/Device KIT    calcium-vitamin D (OSCAL-500) 500-400 MG-UNIT tablet Take 1 tablet by mouth daily.   Continuous Blood Gluc Receiver (FREESTYLE LIBRE 2 READER) DEVI As directed   Continuous Blood Gluc Sensor (FREESTYLE LIBRE 2 SENSOR) MISC 1 Piece by Does not apply route every 14 (fourteen) days.   LANTUS SOLOSTAR 100 UNIT/ML Solostar Pen INJECT SUBCUTANEOUSLY 20  UNITS AT BEDTIME   meclizine (ANTIVERT) 25 MG tablet Take 1 tablet (25 mg total) by mouth 3 (three) times daily  as needed for dizziness.   metFORMIN (GLUCOPHAGE) 1000 MG tablet TAKE 1 TABLET BY MOUTH TWICE  DAILY WITH MEALS   Multiple Vitamins-Minerals (PRESERVISION AREDS PO) Take 2 capsules by mouth daily.   omega-3 acid ethyl esters (LOVAZA) 1 g capsule Take 2 g by mouth 2 (two) times daily.   OZEMPIC, 1 MG/DOSE, 4 MG/3ML SOPN INJECT SUBCUTANEOUSLY 1 MG EVERY WEEK   phenazopyridine (PYRIDIUM) 100 MG tablet Take 1 tablet (100 mg total) by mouth 3 (three) times daily as needed for pain (Dysuria).   polyethylene glycol powder (GLYCOLAX/MIRALAX)  17 GM/SCOOP powder Take 17 g by mouth daily as needed for mild constipation or moderate constipation.    tamsulosin (FLOMAX) 0.4 MG CAPS capsule Take 1 capsule (0.4 mg total) by mouth daily after supper.   telmisartan (MICARDIS) 40 MG tablet TAKE 1 TABLET BY MOUTH DAILY   [DISCONTINUED] nitrofurantoin (MACRODANTIN) 50 MG capsule TAKE 1 CAPSULE BY MOUTH AT  BEDTIME   [DISCONTINUED] sulfamethoxazole-trimethoprim (BACTRIM DS) 800-160 MG tablet Take 1 tablet by mouth 2 (two) times daily for 7 days.     Allergies:   Other and Clindamycin phosphate   ROS:   Please see the history of present illness.     All other systems reviewed and are negative.   Labs/Other Tests and Data Reviewed:    Recent Labs: 03/23/2021: Hemoglobin 9.2; Platelets 333 06/18/2021: ALT 12 10/08/2021: BUN 12; Creatinine, Ser 0.85; Potassium 4.2; Sodium 141   Recent Lipid Panel Lab Results  Component Value Date/Time   CHOL 190 06/18/2021 05:06 PM   TRIG 225 (H) 06/18/2021 05:06 PM   HDL 35 (L) 06/18/2021 05:06 PM   CHOLHDL 5.4 (H) 06/18/2021 05:06 PM   CHOLHDL 5.7 11/23/2019 03:04 PM   LDLCALC 115 (H) 06/18/2021 05:06 PM    Wt Readings from Last 3 Encounters:  12/02/21 119 lb (54 kg)  10/08/21 138 lb (62.6 kg)  06/18/21 148 lb (67.1 kg)     Objective:    Vital Signs:  LMP  (LMP Unknown)      ASSESSMENT & PLAN:   Cystitis We will treat her with Bactrim for 10 days given her history of chronic cystitis Will reinstate her prophylactic Macrobid 50 mg daily treatment for chronic cystitis Encouraged to start prophylactic treatment, after completing Bactrim Encouraged to leave a urine sample to assess for bacteria in her urine Treating prophylactically given her present symptoms and complaints  Time:   Today, I have spent 15 minutes reviewing the chart, including problem list, medications, and with the patient with telehealth technology discussing the above problems.   Medication Adjustments/Labs and Tests  Ordered: Current medicines are reviewed at length with the patient today.  Concerns regarding medicines are outlined above.   Tests Ordered: No orders of the defined types were placed in this encounter.   Medication Changes: Meds ordered this encounter  Medications   DISCONTD: sulfamethoxazole-trimethoprim (BACTRIM DS) 800-160 MG tablet    Sig: Take 1 tablet by mouth 2 (two) times daily for 7 days.    Dispense:  14 tablet    Refill:  0   nitrofurantoin (MACRODANTIN) 50 MG capsule    Sig: Take 1 capsule (50 mg total) by mouth at bedtime.    Dispense:  90 capsule    Refill:  0   sulfamethoxazole-trimethoprim (BACTRIM DS) 800-160 MG tablet    Sig: Take 1 tablet by mouth 2 (two) times daily for 10 days.    Dispense:  20 tablet    Refill:  0     Note: This dictation was prepared with Dragon dictation along with smaller phrase technology. Similar sounding words can be transcribed inadequately or may not be corrected upon review. Any transcriptional errors that result from this process are unintentional.      Disposition:  Follow up  Signed, Alvira Monday, FNP  03/13/2022 1:38 PM     [facility]

## 2022-03-13 NOTE — Progress Notes (Deleted)
   Acute Office Visit  Subjective:     Patient ID: Jessica Mclean, female    DOB: May 20, 1945, 76 y.o.   MRN: [id number]  Chief Complaint  Patient presents with   Back Pain    Pt reports bladder pain and severe back pain, pain level is a 10/10.     HPI Patient is in today for *** urinary incontinece, chronic cystitis, low back pain Hx of cervical cancer  100 pervcent incontinenec, over treated with radiaition therpay, chills, no fever  03/12/22  Has been usiing azo over the couter ROS      Objective:    LMP  (LMP Unknown)  {Vitals History (Optional):23777}  Physical Exam  No results found for any visits on 03/13/22.      Assessment & Plan:   Problem List Items Addressed This Visit   None   No orders of the defined types were placed in this encounter.   No follow-ups on file.  Alvira Monday, FNP

## 2022-03-14 ENCOUNTER — Telehealth: Payer: Self-pay

## 2022-03-14 DIAGNOSIS — N3020 Other chronic cystitis without hematuria: Secondary | ICD-10-CM | POA: Diagnosis not present

## 2022-03-14 NOTE — Telephone Encounter (Signed)
Patient leaving urine was told to leave urine sample in lab.

## 2022-03-14 NOTE — Telephone Encounter (Signed)
Urine sent for culture per [facility]

## 2022-03-14 NOTE — Addendum Note (Signed)
Addended by: Eual Fines on: 03/14/2022 01:08 PM   Modules accepted: Orders

## 2022-03-18 LAB — URINE CULTURE

## 2022-03-22 ENCOUNTER — Other Ambulatory Visit: Payer: Self-pay | Admitting: Internal Medicine

## 2022-03-25 ENCOUNTER — Telehealth: Payer: Self-pay | Admitting: Internal Medicine

## 2022-03-25 ENCOUNTER — Ambulatory Visit (INDEPENDENT_AMBULATORY_CARE_PROVIDER_SITE_OTHER): Payer: Medicare Other | Admitting: Internal Medicine

## 2022-03-25 ENCOUNTER — Encounter: Payer: Self-pay | Admitting: Internal Medicine

## 2022-03-25 ENCOUNTER — Other Ambulatory Visit: Payer: Self-pay | Admitting: Internal Medicine

## 2022-03-25 DIAGNOSIS — N3020 Other chronic cystitis without hematuria: Secondary | ICD-10-CM | POA: Diagnosis not present

## 2022-03-25 DIAGNOSIS — E162 Hypoglycemia, unspecified: Secondary | ICD-10-CM | POA: Insufficient documentation

## 2022-03-25 DIAGNOSIS — E11649 Type 2 diabetes mellitus with hypoglycemia without coma: Secondary | ICD-10-CM | POA: Diagnosis not present

## 2022-03-25 DIAGNOSIS — E785 Hyperlipidemia, unspecified: Secondary | ICD-10-CM | POA: Diagnosis not present

## 2022-03-25 DIAGNOSIS — Z7984 Long term (current) use of oral hypoglycemic drugs: Secondary | ICD-10-CM | POA: Diagnosis not present

## 2022-03-25 DIAGNOSIS — E1169 Type 2 diabetes mellitus with other specified complication: Secondary | ICD-10-CM

## 2022-03-25 MED ORDER — CEPHALEXIN 500 MG PO CAPS: 500.0000 mg | ORAL_CAPSULE | Freq: Three times a day (TID) | ORAL | 0 refills | Status: DC

## 2022-03-25 MED ORDER — DEXCOM G7 SENSOR MISC: 5 refills | Status: DC

## 2022-03-25 MED ORDER — DEXCOM G7 RECEIVER DEVI: 0 refills | Status: DC

## 2022-03-25 NOTE — Telephone Encounter (Signed)
Patient aware.

## 2022-03-25 NOTE — Assessment & Plan Note (Signed)
Checked UA and urine culture Would continue Macrobid ppx dose now Avoid Bactrim as it can cause hypoglycemia Plan Pyridium as needed for chronic dysuria Prescribed Keflex for UTI -to be used for worsening of dysuria

## 2022-03-25 NOTE — Telephone Encounter (Signed)
Pt called stating her glucose is still low. She spoke with her phar and they told her she was a canadate for the Dexcom. She states the phar told her it is more accurate then the [employer]. She is wanting to know if she can get an rx for the Dexcom?

## 2022-03-25 NOTE — Progress Notes (Signed)
Virtual Visit via Telephone Note   This visit type was conducted via telephone. This format is felt to be most appropriate for this patient at this time.  The patient did not have access to video technology/had technical difficulties with video requiring transitioning to audio format only (telephone).  All issues noted in this document were discussed and addressed.  No physical exam could be performed with this format.  Evaluation Performed:  Follow-up visit  Date:  03/25/2022   ID:  Jessica Mclean, DOB [birth year], MRN [id number]  Patient Location: Home Provider Location: Office/Clinic  Participants: Patient Location of Patient: Home Location of Provider: Telehealth Consent was obtain for visit to be over via telehealth. I verified that I am speaking with the correct person using two identifiers.  PCP:  Lindell Spar, MD   Chief Complaint: UTI  History of Present Illness:    Jessica Mclean is a 76 y.o. female who has a televisit for complaint of chronic dysuria.  She was recently treated with Bactrim for UTI and her symptoms have improved now.  She started having hypoglycemic episodes with Bactrim and has been having difficulty bringing her blood glucose above 80 even after stopping Lantus and Metformin.  She has a CGM - freestyle libre.  She has not tried checking her blood glucose with a glucometer recently.  She is complaining of dizziness and nausea, but able to tolerate p.o. intake.  She has history of chronic cystitis from radiation exposure in the past.  She requests an antibiotic prescription for worsening of dysuria, which does not affect her blood glucose level.  The patient does not have symptoms concerning for COVID-19 infection (fever, chills, cough, or new shortness of breath).   Past Medical, Surgical, Social History, Allergies, and Medications have been Reviewed.  Past Medical History:  Diagnosis Date   Bleeding disorder ([place])    Cervical cancer ([place]) 2010    Diabetes mellitus without complication ([place])    type 2   Diabetes mellitus without complication ([place])    Heart murmur    High cholesterol    HLD (hyperlipidemia)    Hypertension    Mental disorder    depression; suicidal ideation in 2017   Sleep apnea    Vitamin D deficiency    Past Surgical History:  Procedure Laterality Date   BILATERAL OOPHORECTOMY     CESAREAN SECTION     x 4   CHOLECYSTECTOMY     COLOSTOMY     x2   CYSTOSCOPY WITH URETHRAL DILATATION N/A 11/15/2020   Procedure: CYSTOSCOPY WITH URETHRAL DILATION;  Surgeon: Cleon Gustin, MD;  Location: [facility];  Service: Urology;  Laterality: N/A;   SALPINGECTOMY     TONSILLECTOMY     TONSILLECTOMY AND ADENOIDECTOMY       No outpatient medications have been marked as taking for the 03/25/22 encounter (Office Visit) with Lindell Spar, MD.     Allergies:   Other and Clindamycin phosphate   ROS:   Please see the history of present illness.     All other systems reviewed and are negative.   Labs/Other Tests and Data Reviewed:    Recent Labs: 06/18/2021: ALT 12 10/08/2021: BUN 12; Creatinine, Ser 0.85; Potassium 4.2; Sodium 141   Recent Lipid Panel Lab Results  Component Value Date/Time   CHOL 190 06/18/2021 05:06 PM   TRIG 225 (H) 06/18/2021 05:06 PM   HDL 35 (L) 06/18/2021 05:06 PM   CHOLHDL 5.4 (H) 06/18/2021 05:06 PM  CHOLHDL 5.7 11/23/2019 03:04 PM   LDLCALC 115 (H) 06/18/2021 05:06 PM    Wt Readings from Last 3 Encounters:  12/02/21 119 lb (54 kg)  10/08/21 138 lb (62.6 kg)  06/18/21 148 lb (67.1 kg)     ASSESSMENT & PLAN:    Chronic cystitis Checked UA and urine culture Would continue Macrobid ppx dose now Avoid Bactrim as it can cause hypoglycemia Plan Pyridium as needed for chronic dysuria Prescribed Keflex for UTI -to be used for worsening of dysuria  Hypoglycemia Likely due to recent Bactrim She has stopped Lantus and metformin Skipped the dose of Ozempic as well Advised to  maintain adequate hydration and eat at regular intervals To check blood glucose with glucometer to verify if CGM is accurate Prescribed Dexcom G7 instead of freestyle libre  Type 2 diabetes mellitus with hyperlipidemia ([place]) Lab Results  Component Value Date   HGBA1C 6.2 (H) 10/08/2021   On Lantus 10 U qHS and Ozempic - now improved On Metformin 1000 mg BID Due to episode of hypoglycemia and tightly controlled DM, had advised to decrease Lantus to 10 U qHS Advised to follow diabetic diet On ARB and statin Diabetic eye exam: Advised to follow up with Ophthalmology for diabetic eye exam   Time:   Today, I have spent 19 minutes reviewing the chart, including problem list, medications, and with the patient with telehealth technology discussing the above problems.   Medication Adjustments/Labs and Tests Ordered: Current medicines are reviewed at length with the patient today.  Concerns regarding medicines are outlined above.   Tests Ordered: No orders of the defined types were placed in this encounter.   Medication Changes: No orders of the defined types were placed in this encounter.    Note: This dictation was prepared with Dragon dictation along with smaller phrase technology. Similar sounding words can be transcribed inadequately or may not be corrected upon review. Any transcriptional errors that result from this process are unintentional.      Disposition:  Follow up  Signed, Lindell Spar, MD  03/25/2022 10:37 AM     [place]

## 2022-03-25 NOTE — Assessment & Plan Note (Signed)
Likely due to recent Bactrim She has stopped Lantus and metformin Skipped the dose of Ozempic as well Advised to maintain adequate hydration and eat at regular intervals To check blood glucose with glucometer to verify if CGM is accurate Prescribed Dexcom G7 instead of freestyle [employer]

## 2022-03-25 NOTE — Patient Instructions (Signed)
Please do not take Bactrim.  Please take Cephalexin when urinary symptoms flare up.  Please check blood glucose with glucometer instead of Dexcom for 2 days.

## 2022-03-25 NOTE — Assessment & Plan Note (Signed)
Lab Results  Component Value Date   HGBA1C 6.2 (H) 10/08/2021   On Lantus 10 U qHS and Ozempic - now improved On Metformin 1000 mg BID Due to episode of hypoglycemia and tightly controlled DM, had advised to decrease Lantus to 10 U qHS Advised to follow diabetic diet On ARB and statin Diabetic eye exam: Advised to follow up with Ophthalmology for diabetic eye exam

## 2022-03-26 ENCOUNTER — Encounter: Payer: Self-pay | Admitting: Internal Medicine

## 2022-03-26 ENCOUNTER — Ambulatory Visit (INDEPENDENT_AMBULATORY_CARE_PROVIDER_SITE_OTHER): Payer: Medicare Other | Admitting: Internal Medicine

## 2022-03-26 VITALS — BP 136/74 | HR 81 | Ht 64.0 in | Wt 126.0 lb

## 2022-03-26 DIAGNOSIS — E1169 Type 2 diabetes mellitus with other specified complication: Secondary | ICD-10-CM | POA: Diagnosis not present

## 2022-03-26 DIAGNOSIS — E162 Hypoglycemia, unspecified: Secondary | ICD-10-CM

## 2022-03-26 DIAGNOSIS — N3020 Other chronic cystitis without hematuria: Secondary | ICD-10-CM | POA: Diagnosis not present

## 2022-03-26 DIAGNOSIS — Z8541 Personal history of malignant neoplasm of cervix uteri: Secondary | ICD-10-CM | POA: Diagnosis not present

## 2022-03-26 DIAGNOSIS — E785 Hyperlipidemia, unspecified: Secondary | ICD-10-CM | POA: Diagnosis not present

## 2022-03-26 NOTE — Patient Instructions (Signed)
Please follow up with Urologist for urinary incontinence and dysuria.  Please check blood glucose regularly and eat at regular intervals.

## 2022-03-26 NOTE — Assessment & Plan Note (Addendum)
Lab Results  Component Value Date   HGBA1C 6.2 (H) 10/08/2021   On Lantus 10 U qHS and Ozempic - improved On Metformin 1000 mg BID - held for now due to hypoglycemia Due to episode of hypoglycemia and tightly controlled DM, had advised to decrease Lantus to 10 U qHS - held for now Advised to follow diabetic diet On ARB and statin Diabetic eye exam: Advised to follow up with Ophthalmology for diabetic eye exam

## 2022-03-26 NOTE — Progress Notes (Signed)
Acute Office Visit  Subjective:    Patient ID: Jessica Mclean, female    DOB: February 24, 1946, 76 y.o.   MRN: [id number]  Chief Complaint  Patient presents with   Follow-up    Follow up still not feeling well     HPI Patient is in today for complaint of dysuria, which is chronic.  She has chronic dysuria and lower abdominal pain from radiation exposure in the past.  She was recently treated with Bactrim for UTI, but had hypoglycemia with it.  She denies any fever or chills currently.  She is concerned about recurrence of her cervical cancer, but denies any vaginal bleeding or discharge currently.  She had Pap smear in 08/23, which showed ASCUS with negative HPV.  She has seen urology for chronic cystitis, but did not continue to follow-up.  She had better response with Myrbetriq, but could not continue due to cost concern.  She has been having hypoglycemia episodes despite stopping Lantus and Ozempic.  Of note, she admits that she has only 1 meal in a day.  Past Medical History:  Diagnosis Date   Bleeding disorder ([place])    Cervical cancer ([place]) 2010   Diabetes mellitus without complication ([place])    type 2   Diabetes mellitus without complication ([place])    Heart murmur    High cholesterol    HLD (hyperlipidemia)    Hypertension    Mental disorder    depression; suicidal ideation in 2017   Sleep apnea    Vitamin D deficiency     Past Surgical History:  Procedure Laterality Date   BILATERAL OOPHORECTOMY     CESAREAN SECTION     x 4   CHOLECYSTECTOMY     COLOSTOMY     x2   CYSTOSCOPY WITH URETHRAL DILATATION N/A 11/15/2020   Procedure: CYSTOSCOPY WITH URETHRAL DILATION;  Surgeon: Cleon Gustin, MD;  Location: [facility];  Service: Urology;  Laterality: N/A;   SALPINGECTOMY     TONSILLECTOMY     TONSILLECTOMY AND ADENOIDECTOMY      Family History  Problem Relation Age of Onset   Cancer Father        kidney   Cancer Mother        lung   Other Brother        heart issues     Social History   Socioeconomic History   Marital status: Divorced    Spouse name: Not on file   Number of children: 3   Years of education: Not on file   Highest education level: Not on file  Occupational History   Occupation: retired  Tobacco Use   Smoking status: Never   Smokeless tobacco: Never  Vaping Use   Vaping Use: Never used  Substance and Sexual Activity   Alcohol use: Never   Drug use: Never   Sexual activity: Not Currently    Birth control/protection: Post-menopausal  Other Topics Concern   Not on file  Social History Narrative   ** Merged History Encounter **       Social Determinants of Health   Financial Resource Strain: Medium Risk (07/31/2020)   Overall Financial Resource Strain (CARDIA)    Difficulty of Paying Living Expenses: Somewhat hard  Food Insecurity: Food Insecurity Present (07/31/2020)   Hunger Vital Sign    Worried About [place] in the Last Year: Sometimes true    Ran Out of Food in the Last Year: Sometimes true  Transportation Needs: No Transportation  Needs (07/31/2020)   PRAPARE - Hydrologist (Medical): No    Lack of Transportation (Non-Medical): No  Physical Activity: Insufficiently Active (07/31/2020)   Exercise Vital Sign    Days of Exercise per Week: 4 days    Minutes of Exercise per Session: 30 min  Stress: No Stress Concern Present (07/31/2020)   [place]    Feeling of Stress : Only a little  Social Connections: Socially Isolated (07/31/2020)   Social Connection and Isolation Panel [NHANES]    Frequency of Communication with Friends and Family: More than three times a week    Frequency of Social Gatherings with Friends and Family: Twice a week    Attends Religious Services: Never    Marine scientist or Organizations: No    Attends Archivist Meetings: Never    Marital Status: Widowed  Human resources officer  Violence: Not on file    Outpatient Medications Prior to Visit  Medication Sig Dispense Refill   ACCU-CHEK AVIVA PLUS test strip      Accu-Chek Softclix Lancets lancets SMARTSIG:Topical     amLODipine (NORVASC) 10 MG tablet TAKE 1 TABLET BY MOUTH  DAILY 100 tablet 2   aspirin-acetaminophen-caffeine (EXCEDRIN EXTRA STRENGTH) 250-250-65 MG tablet Take 2 tablets by mouth in the morning.     atorvastatin (LIPITOR) 80 MG tablet TAKE 1 TABLET BY MOUTH DAILY 100 tablet 2   Blood Glucose Monitoring Suppl (ACCU-CHEK GUIDE) w/Device KIT      calcium-vitamin D (OSCAL-500) 500-400 MG-UNIT tablet Take 1 tablet by mouth daily.     cephALEXin (KEFLEX) 500 MG capsule Take 1 capsule (500 mg total) by mouth 3 (three) times daily. 30 capsule 0   Continuous Blood Gluc Receiver (DEXCOM G7 RECEIVER) DEVI Use it to check blood glucose 3 times before meals, at bedtime and as needed. 1 each 0   Continuous Blood Gluc Sensor (DEXCOM G7 SENSOR) MISC Use it to check blood glucose 3 times before meals, at bedtime and as needed. 3 each 5   LANTUS SOLOSTAR 100 UNIT/ML Solostar Pen INJECT SUBCUTANEOUSLY 20  UNITS AT BEDTIME 30 mL 3   meclizine (ANTIVERT) 25 MG tablet Take 1 tablet (25 mg total) by mouth 3 (three) times daily as needed for dizziness. 30 tablet 1   metFORMIN (GLUCOPHAGE) 1000 MG tablet TAKE 1 TABLET BY MOUTH TWICE  DAILY WITH MEALS 200 tablet 2   Multiple Vitamins-Minerals (PRESERVISION AREDS PO) Take 2 capsules by mouth daily.     nitrofurantoin (MACRODANTIN) 50 MG capsule Take 1 capsule (50 mg total) by mouth at bedtime. 90 capsule 0   omega-3 acid ethyl esters (LOVAZA) 1 g capsule Take 2 g by mouth 2 (two) times daily.     OZEMPIC, 1 MG/DOSE, 4 MG/3ML SOPN INJECT SUBCUTANEOUSLY 1 MG EVERY WEEK 9 mL 3   phenazopyridine (PYRIDIUM) 100 MG tablet Take 1 tablet (100 mg total) by mouth 3 (three) times daily as needed for pain (Dysuria). 90 tablet 5   polyethylene glycol powder (GLYCOLAX/MIRALAX) 17 GM/SCOOP powder  Take 17 g by mouth daily as needed for mild constipation or moderate constipation.      tamsulosin (FLOMAX) 0.4 MG CAPS capsule Take 1 capsule (0.4 mg total) by mouth daily after supper. 30 capsule 11   telmisartan (MICARDIS) 40 MG tablet TAKE 1 TABLET BY MOUTH DAILY 100 tablet 2   No facility-administered medications prior to visit.    Allergies  Allergen  Reactions   Other     Nickel staples   Clindamycin Phosphate Nausea And Vomiting    Review of Systems  Constitutional:  Positive for fatigue and unexpected weight change. Negative for chills and fever.  HENT:  Negative for congestion, sinus pressure, sinus pain and sore throat.   Eyes:  Negative for pain and discharge.  Respiratory:  Negative for cough and shortness of breath.   Cardiovascular:  Negative for chest pain and palpitations.  Gastrointestinal:  Positive for nausea. Negative for blood in stool and vomiting.  Endocrine: Negative for polydipsia and polyuria.  Genitourinary:  Positive for dysuria and urgency. Negative for flank pain, frequency and hematuria.  Musculoskeletal:  Negative for neck pain and neck stiffness.  Skin:  Negative for rash.  Neurological:  Positive for dizziness and weakness. Negative for speech difficulty.  Psychiatric/Behavioral:  Negative for agitation and behavioral problems.        Objective:    Physical Exam Vitals reviewed.  Constitutional:      General: She is not in acute distress.    Appearance: She is not diaphoretic.  HENT:     Head: Normocephalic and atraumatic.     Nose: Nose normal.     Mouth/Throat:     Mouth: Mucous membranes are moist.  Eyes:     General: No scleral icterus.    Extraocular Movements: Extraocular movements intact.  Cardiovascular:     Rate and Rhythm: Normal rate and regular rhythm.     Pulses: Normal pulses.     Heart sounds: Normal heart sounds. No murmur heard. Pulmonary:     Breath sounds: Normal breath sounds. No wheezing or rales.  Abdominal:      Palpations: Abdomen is soft.     Tenderness: There is abdominal tenderness (Mild, generalized).  Musculoskeletal:     Cervical back: Neck supple. No tenderness.     Right lower leg: No edema.     Left lower leg: No edema.  Skin:    General: Skin is warm.     Findings: No rash.  Neurological:     General: No focal deficit present.     Mental Status: She is alert and oriented to person, place, and time.     Sensory: No sensory deficit.     Motor: No weakness.  Psychiatric:        Mood and Affect: Mood normal.        Behavior: Behavior normal.     BP 136/74 (BP Location: Left Arm)   Pulse 81   Ht _0  (1.626 m)   Wt 126 lb (57.2 kg)   LMP  (LMP Unknown)   SpO2 97%   BMI 21.63 kg/m  Wt Readings from Last 3 Encounters:  03/26/22 126 lb (57.2 kg)  12/02/21 119 lb (54 kg)  10/08/21 138 lb (62.6 kg)        Assessment & Plan:   Problem List Items Addressed This Visit       Endocrine   Type 2 diabetes mellitus with hyperlipidemia ([place])    Lab Results  Component Value Date   HGBA1C 6.2 (H) 10/08/2021  On Lantus 10 U qHS and Ozempic - improved On Metformin 1000 mg BID - held for now due to hypoglycemia Due to episode of hypoglycemia and tightly controlled DM, had advised to decrease Lantus to 10 U qHS - held for now Advised to follow diabetic diet On ARB and statin Diabetic eye exam: Advised to follow up with Ophthalmology for diabetic  eye exam      Hypoglycemia    Likely due to recent Bactrim She has stopped Lantus and metformin Skipped the dose of Ozempic as well Advised to maintain adequate hydration and eat at regular intervals To check blood glucose with glucometer to verify if CGM is accurate Prescribed Dexcom G7 instead of freestyle libre        Genitourinary   Chronic cystitis - Primary    Checked UA and urine culture Would continue Macrobid ppx dose now Avoid Bactrim as it can cause hypoglycemia Plan Pyridium as needed for chronic  dysuria Prescribed Keflex for UTI -to be used for worsening of dysuria Referred to urology      Relevant Orders   Ambulatory referral to Urology     Other   History of cervical cancer    S/p radiation Has chronic cystitis Seen Ob/Gyn for PAP smear - showed ASCUS        No orders of the defined types were placed in this encounter.    Lindell Spar, MD

## 2022-03-26 NOTE — Assessment & Plan Note (Signed)
S/p radiation Has chronic cystitis Seen Ob/Gyn for PAP smear - showed ASCUS

## 2022-03-26 NOTE — Assessment & Plan Note (Signed)
Checked UA and urine culture Would continue Macrobid ppx dose now Avoid Bactrim as it can cause hypoglycemia Plan Pyridium as needed for chronic dysuria Prescribed Keflex for UTI -to be used for worsening of dysuria Referred to urology

## 2022-03-26 NOTE — Assessment & Plan Note (Signed)
Likely due to recent Bactrim She has stopped Lantus and metformin Skipped the dose of Ozempic as well Advised to maintain adequate hydration and eat at regular intervals To check blood glucose with glucometer to verify if CGM is accurate Prescribed Dexcom G7 instead of freestyle [employer]

## 2022-03-27 ENCOUNTER — Ambulatory Visit: Payer: Medicare Other | Admitting: Physician Assistant

## 2022-03-31 ENCOUNTER — Ambulatory Visit: Payer: Medicare Other | Admitting: Physician Assistant

## 2022-03-31 NOTE — Progress Notes (Incomplete)
H&P  Chief Complaint: ***  History of Present Illness: Jessica Mclean is a 76 y.o. year old female ***  Past Medical History:  Diagnosis Date   Bleeding disorder ([place])    Cervical cancer ([place]) 2010   Diabetes mellitus without complication ([place])    type 2   Diabetes mellitus without complication ([place])    Heart murmur    High cholesterol    HLD (hyperlipidemia)    Hypertension    Mental disorder    depression; suicidal ideation in 2017   Sleep apnea    Vitamin D deficiency     Past Surgical History:  Procedure Laterality Date   BILATERAL OOPHORECTOMY     CESAREAN SECTION     x 4   CHOLECYSTECTOMY     COLOSTOMY     x2   CYSTOSCOPY WITH URETHRAL DILATATION N/A 11/15/2020   Procedure: CYSTOSCOPY WITH URETHRAL DILATION;  Surgeon: Cleon Gustin, MD;  Location: [facility];  Service: Urology;  Laterality: N/A;   SALPINGECTOMY     TONSILLECTOMY     TONSILLECTOMY AND ADENOIDECTOMY      Home Medications:  (Not in a hospital admission)   Allergies:  Allergies  Allergen Reactions   Other     Nickel staples   Clindamycin Phosphate Nausea And Vomiting    Family History  Problem Relation Age of Onset   Cancer Father        kidney   Cancer Mother        lung   Other Brother        heart issues    Social History:  reports that she has never smoked. She has never used smokeless tobacco. She reports that she does not drink alcohol and does not use drugs.  ROS: A complete review of systems was performed.  All systems are negative except for pertinent findings as noted.  Physical Exam:  Vital signs in last 24 hours: '@VSRANGES'$ @ General:  Alert and oriented, No acute distress HEENT: Normocephalic, atraumatic Neck: No JVD or lymphadenopathy Cardiovascular: Regular rate  Lungs: Normal inspiratory/expiratory excursion Abdomen: Soft, nontender, nondistended, no abdominal masses Back: No CVA tenderness Extremities: No edema Neurologic: Grossly intact  I have reviewed  prior pt notes  I have reviewed notes from referring/previous physicians  I have reviewed urinalysis results  I have independently reviewed prior imaging  I have reviewed prior urine culture   Impression/Assessment:  ***  Plan:  ***  Lillette Boxer Kelilah Hebard 03/31/2022, 9:10 PM  Lillette Boxer. Rourke Mcquitty MD

## 2022-04-01 ENCOUNTER — Ambulatory Visit: Payer: Medicare Other | Admitting: Urology

## 2022-04-07 ENCOUNTER — Ambulatory Visit (INDEPENDENT_AMBULATORY_CARE_PROVIDER_SITE_OTHER): Payer: Medicare Other | Admitting: Urology

## 2022-04-07 ENCOUNTER — Encounter: Payer: Self-pay | Admitting: Urology

## 2022-04-07 VITALS — BP 157/69 | HR 77

## 2022-04-07 DIAGNOSIS — N3946 Mixed incontinence: Secondary | ICD-10-CM

## 2022-04-07 DIAGNOSIS — R300 Dysuria: Secondary | ICD-10-CM | POA: Diagnosis not present

## 2022-04-07 NOTE — Progress Notes (Unsigned)
04/07/2022 2:22 PM   Jessica Mclean 1946-05-08 [id number]  Referring provider: Lindell Spar, MD [address] [place],  [place] [address]  No chief complaint on file.   HPI:  Pt of Dr. Crissie Sickles added for today -- She has a h/o meatal stricture dilated in OR Jun 2023 after in office cystoscopy was unsuccessful. No gross hematuria. She has incontinence. She takes tamsulosin. She has no constipation.    She recently had bladder pain. Cultures were negative and this concerned her as their was not an infectious explanation for her pain. Bactrim and cephalexin helped her pain. Cx < 10 k.   She has a hx of cervical cancer treated with radiation therapy 7 years ago. She has had hemorrhagic cystitis treated with hyperbaric oxygen therapy   She is a Marine scientist.        PMH: Past Medical History:  Diagnosis Date   Bleeding disorder ([facility])    Cervical cancer ([facility]) 2010   Diabetes mellitus without complication ([facility])    type 2   Diabetes mellitus without complication ([facility])    Heart murmur    High cholesterol    HLD (hyperlipidemia)    Hypertension    Mental disorder    depression; suicidal ideation in 2017   Sleep apnea    Vitamin D deficiency     Surgical History: Past Surgical History:  Procedure Laterality Date   BILATERAL OOPHORECTOMY     CESAREAN SECTION     x 4   CHOLECYSTECTOMY     COLOSTOMY     x2   CYSTOSCOPY WITH URETHRAL DILATATION N/A 11/15/2020   Procedure: CYSTOSCOPY WITH URETHRAL DILATION;  Surgeon: Cleon Gustin, MD;  Location: [facility];  Service: Urology;  Laterality: N/A;   SALPINGECTOMY     TONSILLECTOMY     TONSILLECTOMY AND ADENOIDECTOMY      Home Medications:  Allergies as of 04/07/2022       Reactions   Other    Nickel staples   Clindamycin Phosphate Nausea And Vomiting        Medication List        Accurate as of April 07, 2022  2:22 PM. If you have any questions, ask your nurse or doctor.          Accu-Chek Aviva Plus test  strip Generic drug: glucose blood   Accu-Chek Guide w/Device Kit   Accu-Chek Softclix Lancets lancets SMARTSIG:Topical   amLODipine 10 MG tablet Commonly known as: NORVASC TAKE 1 TABLET BY MOUTH  DAILY   atorvastatin 80 MG tablet Commonly known as: LIPITOR TAKE 1 TABLET BY MOUTH DAILY   calcium-vitamin D 500-400 MG-UNIT tablet Commonly known as: OSCAL-500 Take 1 tablet by mouth daily.   cephALEXin 500 MG capsule Commonly known as: Keflex Take 1 capsule (500 mg total) by mouth 3 (three) times daily.   Dexcom G7 Receiver Devi Use it to check blood glucose 3 times before meals, at bedtime and as needed.   Dexcom G7 Sensor Misc Use it to check blood glucose 3 times before meals, at bedtime and as needed.   Excedrin Extra Strength 250-250-65 MG tablet Generic drug: aspirin-acetaminophen-caffeine Take 2 tablets by mouth in the morning.   Lantus SoloStar 100 UNIT/ML Solostar Pen Generic drug: insulin glargine INJECT SUBCUTANEOUSLY 20  UNITS AT BEDTIME   meclizine 25 MG tablet Commonly known as: ANTIVERT Take 1 tablet (25 mg total) by mouth 3 (three) times daily as needed for dizziness.   metFORMIN 1000 MG tablet  Commonly known as: GLUCOPHAGE TAKE 1 TABLET BY MOUTH TWICE  DAILY WITH MEALS   nitrofurantoin 50 MG capsule Commonly known as: MACRODANTIN Take 1 capsule (50 mg total) by mouth at bedtime.   omega-3 acid ethyl esters 1 g capsule Commonly known as: LOVAZA Take 2 g by mouth 2 (two) times daily.   Ozempic (1 MG/DOSE) 4 MG/3ML Sopn Generic drug: Semaglutide (1 MG/DOSE) INJECT SUBCUTANEOUSLY 1 MG EVERY WEEK   phenazopyridine 100 MG tablet Commonly known as: Pyridium Take 1 tablet (100 mg total) by mouth 3 (three) times daily as needed for pain (Dysuria).   polyethylene glycol powder 17 GM/SCOOP powder Commonly known as: GLYCOLAX/MIRALAX Take 17 g by mouth daily as needed for mild constipation or moderate constipation.   PRESERVISION AREDS PO Take 2  capsules by mouth daily.   tamsulosin 0.4 MG Caps capsule Commonly known as: Flomax Take 1 capsule (0.4 mg total) by mouth daily after supper.   telmisartan 40 MG tablet Commonly known as: MICARDIS TAKE 1 TABLET BY MOUTH DAILY        Allergies:  Allergies  Allergen Reactions   Other     Nickel staples   Clindamycin Phosphate Nausea And Vomiting    Family History: Family History  Problem Relation Age of Onset   Cancer Father        kidney   Cancer Mother        lung   Other Brother        heart issues    Social History:  reports that she has never smoked. She has never used smokeless tobacco. She reports that she does not drink alcohol and does not use drugs.   Physical Exam: BP (!) 157/69   Pulse 77   LMP  (LMP Unknown)   Constitutional:  Alert and oriented, No acute distress. HEENT: [place] AT, moist mucus membranes.  Trachea midline, no masses. Cardiovascular: No clubbing, cyanosis, or edema. Respiratory: Normal respiratory effort, no increased work of breathing. GI: Abdomen is soft, nontender, nondistended, no abdominal masses GU: No CVA tenderness Skin: No rashes, bruises or suspicious lesions. Neurologic: Grossly intact, no focal deficits, moving all 4 extremities. Psychiatric: Normal mood and affect.  Laboratory Data: Lab Results  Component Value Date   WBC 6.0 03/23/2021   HGB 9.2 (L) 03/23/2021   HCT 30.0 (L) 03/23/2021   MCV 79.8 (L) 03/23/2021   PLT 333 03/23/2021    Lab Results  Component Value Date   CREATININE 0.85 10/08/2021    No results found for: "PSA"  No results found for: "TESTOSTERONE"  Lab Results  Component Value Date   HGBA1C 6.2 (H) 10/08/2021    Urinalysis    Component Value Date/Time   COLORURINE YELLOW 11/23/2019 0133   APPEARANCEUR Clear 11/27/2020 1430   LABSPEC 1.016 11/23/2019 0133   PHURINE 5.0 11/23/2019 0133   GLUCOSEU CANCELED 10/08/2021 1304   HGBUR NEGATIVE 11/23/2019 0133   BILIRUBINUR Negative  11/27/2020 1430   KETONESUR negative 03/08/2020 1429   KETONESUR NEGATIVE 11/23/2019 0133   PROTEINUR CANCELED 10/08/2021 1304   PROTEINUR 30 (A) 11/23/2019 0133   UROBILINOGEN 0.2 03/08/2020 1429   NITRITE Negative 11/27/2020 1430   NITRITE NEGATIVE 11/23/2019 0133   LEUKOCYTESUR Negative 11/27/2020 1430   LEUKOCYTESUR NEGATIVE 11/23/2019 0133    Lab Results  Component Value Date   LABMICR See below: 11/27/2020   WBCUA None seen 11/27/2020   LABEPIT None seen 11/27/2020   MUCUS Present 11/27/2020   BACTERIA None seen 11/27/2020  Reviewed pcp note, dr Crissie Sickles note and op note   Assessment & Plan:    Mixed incontinence - can be difficult to treat due to h/o radiation and stricture. Disc OAB meds and she will consider. Disc med options such as oab meds, elavil, hydroxyzine. She really just wants to rule out bladder cancer.   Dysuria - she is worried about bladder cancer/ She will follow-up for exam and cystoscopy.   No follow-ups on file.  Festus Aloe, MD  [facility]  [address] [place], [place] [address] [phone]

## 2022-04-08 LAB — MICROSCOPIC EXAMINATION
Bacteria, UA: NONE SEEN
RBC, Urine: NONE SEEN /hpf (ref 0–2)

## 2022-04-08 LAB — URINALYSIS, ROUTINE W REFLEX MICROSCOPIC
Bilirubin, UA: NEGATIVE
Glucose, UA: NEGATIVE
Ketones, UA: NEGATIVE
Leukocytes,UA: NEGATIVE
Nitrite, UA: NEGATIVE
RBC, UA: NEGATIVE
Specific Gravity, UA: 1.025 (ref 1.005–1.030)
Urobilinogen, Ur: 0.2 mg/dL (ref 0.2–1.0)
pH, UA: 5 (ref 5.0–7.5)

## 2022-04-15 ENCOUNTER — Encounter: Payer: Self-pay | Admitting: Internal Medicine

## 2022-04-15 ENCOUNTER — Ambulatory Visit (INDEPENDENT_AMBULATORY_CARE_PROVIDER_SITE_OTHER): Payer: Medicare Other | Admitting: Internal Medicine

## 2022-04-15 VITALS — BP 121/66 | HR 92 | Ht 64.0 in | Wt 126.0 lb

## 2022-04-15 DIAGNOSIS — N3020 Other chronic cystitis without hematuria: Secondary | ICD-10-CM | POA: Diagnosis not present

## 2022-04-15 DIAGNOSIS — E559 Vitamin D deficiency, unspecified: Secondary | ICD-10-CM

## 2022-04-15 DIAGNOSIS — Z23 Encounter for immunization: Secondary | ICD-10-CM | POA: Diagnosis not present

## 2022-04-15 DIAGNOSIS — E162 Hypoglycemia, unspecified: Secondary | ICD-10-CM | POA: Diagnosis not present

## 2022-04-15 DIAGNOSIS — Z0001 Encounter for general adult medical examination with abnormal findings: Secondary | ICD-10-CM | POA: Diagnosis not present

## 2022-04-15 DIAGNOSIS — E785 Hyperlipidemia, unspecified: Secondary | ICD-10-CM | POA: Diagnosis not present

## 2022-04-15 DIAGNOSIS — I10 Essential (primary) hypertension: Secondary | ICD-10-CM

## 2022-04-15 DIAGNOSIS — E1169 Type 2 diabetes mellitus with other specified complication: Secondary | ICD-10-CM

## 2022-04-15 MED ORDER — LANCETS MISC: 5 refills | Status: DC

## 2022-04-15 MED ORDER — BLOOD GLUCOSE TEST VI STRP: ORAL_STRIP | 5 refills | Status: DC

## 2022-04-15 MED ORDER — BLOOD GLUCOSE METER KIT: PACK | 0 refills | Status: AC

## 2022-04-15 NOTE — Assessment & Plan Note (Signed)
Checked UA and urine culture Avoid Bactrim as it can cause hypoglycemia Plan Pyridium as needed for chronic dysuria Prescribed Keflex for UTI -to be used for worsening of dysuria Referred to urology - needs cystoscopy

## 2022-04-15 NOTE — Assessment & Plan Note (Signed)
Was thought to be likely due to recent Bactrim She has stopped Lantus and metformin Skipped the dose of Ozempic as well Unclear about persistent hypoglycemia episodes-advised to check blood glucose with fingersticks for now If consistent hypoglycemia noted, will check C-peptide and proinsulin Advised to maintain adequate hydration and eat at regular intervals

## 2022-04-15 NOTE — Assessment & Plan Note (Signed)
Physical exam as documented. Fasting blood tests today. Flu and PCV 20 vaccines today.

## 2022-04-15 NOTE — Patient Instructions (Addendum)
Please stop taking Lantus and Ozempic for now.  Please continue taking medications as prescribed.  Please continue to follow low carb diet and ambulate as tolerated.  Please check blood glucose with glucometer for next 2 weeks at least twice daily and bring the log in the next week.

## 2022-04-15 NOTE — Assessment & Plan Note (Signed)
Lab Results  Component Value Date   HGBA1C 6.2 (H) 10/08/2021   On Lantus 10 U qHS and Ozempic, on Metformin 1000 mg BID - held for now due to hypoglycemia Advised to check blood glucose with fingersticks for verification of hypoglycemia Advised to follow diabetic diet On ARB and statin Diabetic eye exam: Advised to follow up with Ophthalmology for diabetic eye exam

## 2022-04-15 NOTE — Assessment & Plan Note (Signed)
BP Readings from Last 1 Encounters:  04/15/22 121/66   Well-controlled with telmisartan 40 mg daily and amlodipine 10 mg daily now Counseled for compliance with the medications Advised DASH diet and moderate exercise/walking as tolerated

## 2022-04-15 NOTE — Assessment & Plan Note (Signed)
On Lipitor 

## 2022-04-15 NOTE — Progress Notes (Signed)
Established Patient Office Visit  Subjective:  Patient ID: Jessica Mclean, female    DOB: 28-Apr-1946  Age: 76 y.o. MRN: [id number]  CC:  Chief Complaint  Patient presents with   Annual Exam    CPE, Hypoglycemia     HPI Jessica Mclean is a 76 y.o. female with past medical history of HTN, DM2 on insulin, TIA, HLD and cervical ca. s/p radiation who presents for annual physical.  Type II DM: She has been having hypoglycemia episodes despite stopping Lantus and Ozempic.  Of note, she admits that she has only 1 meal in a day.  She states that she has hypoglycemia alerts from her CGM every night and has to wake up to take snacks.  She has not check blood glucose with regular fingerstick recently.  She was recently given Keflex for UTI after she had concern for Bactrim.  She went to see urologist for chronic cystitis and was advised to get cystoscopy, but she has not followed up yet.  She currently denies any fever, chills, dysuria or hematuria.   Past Medical History:  Diagnosis Date   Bleeding disorder ([facility])    Cervical cancer ([facility]) 2010   Diabetes mellitus without complication ([facility])    type 2   Diabetes mellitus without complication ([facility])    Heart murmur    High cholesterol    HLD (hyperlipidemia)    Hypertension    Mental disorder    depression; suicidal ideation in 2017   Sleep apnea    Vitamin D deficiency     Past Surgical History:  Procedure Laterality Date   BILATERAL OOPHORECTOMY     CESAREAN SECTION     x 4   CHOLECYSTECTOMY     COLOSTOMY     x2   CYSTOSCOPY WITH URETHRAL DILATATION N/A 11/15/2020   Procedure: CYSTOSCOPY WITH URETHRAL DILATION;  Surgeon: Cleon Gustin, MD;  Location: [facility];  Service: Urology;  Laterality: N/A;   SALPINGECTOMY     TONSILLECTOMY     TONSILLECTOMY AND ADENOIDECTOMY      Family History  Problem Relation Age of Onset   Cancer Father        kidney   Cancer Mother        lung   Other Brother        heart issues     Social History   Socioeconomic History   Marital status: Divorced    Spouse name: Not on file   Number of children: 3   Years of education: Not on file   Highest education level: Not on file  Occupational History   Occupation: retired  Tobacco Use   Smoking status: Never   Smokeless tobacco: Never  Vaping Use   Vaping Use: Never used  Substance and Sexual Activity   Alcohol use: Never   Drug use: Never   Sexual activity: Not Currently    Birth control/protection: Post-menopausal  Other Topics Concern   Not on file  Social History Narrative   ** Merged History Encounter **       Social Determinants of Health   Financial Resource Strain: Medium Risk (07/31/2020)   Overall Financial Resource Strain (CARDIA)    Difficulty of Paying Living Expenses: Somewhat hard  Food Insecurity: Food Insecurity Present (07/31/2020)   Hunger Vital Sign    Worried About [place] in the Last Year: Sometimes true    Ran Out of Food in the Last Year: Sometimes true  Transportation Needs: No Transportation  Needs (07/31/2020)   PRAPARE - Hydrologist (Medical): No    Lack of Transportation (Non-Medical): No  Physical Activity: Insufficiently Active (07/31/2020)   Exercise Vital Sign    Days of Exercise per Week: 4 days    Minutes of Exercise per Session: 30 min  Stress: No Stress Concern Present (07/31/2020)   [place]    Feeling of Stress : Only a little  Social Connections: Socially Isolated (07/31/2020)   Social Connection and Isolation Panel [NHANES]    Frequency of Communication with Friends and Family: More than three times a week    Frequency of Social Gatherings with Friends and Family: Twice a week    Attends Religious Services: Never    Marine scientist or Organizations: No    Attends Archivist Meetings: Never    Marital Status: Widowed  Human resources officer  Violence: Not on file    Outpatient Medications Prior to Visit  Medication Sig Dispense Refill   amLODipine (NORVASC) 10 MG tablet TAKE 1 TABLET BY MOUTH  DAILY 100 tablet 2   aspirin-acetaminophen-caffeine (EXCEDRIN EXTRA STRENGTH) 250-250-65 MG tablet Take 2 tablets by mouth in the morning.     atorvastatin (LIPITOR) 80 MG tablet TAKE 1 TABLET BY MOUTH DAILY 100 tablet 2   Blood Glucose Monitoring Suppl (ACCU-CHEK GUIDE) w/Device KIT      calcium-vitamin D (OSCAL-500) 500-400 MG-UNIT tablet Take 1 tablet by mouth daily.     Continuous Blood Gluc Receiver (DEXCOM G7 RECEIVER) DEVI Use it to check blood glucose 3 times before meals, at bedtime and as needed. 1 each 0   Continuous Blood Gluc Sensor (DEXCOM G7 SENSOR) MISC Use it to check blood glucose 3 times before meals, at bedtime and as needed. 3 each 5   meclizine (ANTIVERT) 25 MG tablet Take 1 tablet (25 mg total) by mouth 3 (three) times daily as needed for dizziness. 30 tablet 1   metFORMIN (GLUCOPHAGE) 1000 MG tablet TAKE 1 TABLET BY MOUTH TWICE  DAILY WITH MEALS 200 tablet 2   Multiple Vitamins-Minerals (PRESERVISION AREDS PO) Take 2 capsules by mouth daily.     omega-3 acid ethyl esters (LOVAZA) 1 g capsule Take 2 g by mouth 2 (two) times daily.     OZEMPIC, 1 MG/DOSE, 4 MG/3ML SOPN INJECT SUBCUTANEOUSLY 1 MG EVERY WEEK 9 mL 3   phenazopyridine (PYRIDIUM) 100 MG tablet Take 1 tablet (100 mg total) by mouth 3 (three) times daily as needed for pain (Dysuria). 90 tablet 5   polyethylene glycol powder (GLYCOLAX/MIRALAX) 17 GM/SCOOP powder Take 17 g by mouth daily as needed for mild constipation or moderate constipation.      tamsulosin (FLOMAX) 0.4 MG CAPS capsule Take 1 capsule (0.4 mg total) by mouth daily after supper. 30 capsule 11   telmisartan (MICARDIS) 40 MG tablet TAKE 1 TABLET BY MOUTH DAILY 100 tablet 2   ACCU-CHEK AVIVA PLUS test strip      Accu-Chek Softclix Lancets lancets SMARTSIG:Topical     LANTUS SOLOSTAR 100 UNIT/ML  Solostar Pen INJECT SUBCUTANEOUSLY 20  UNITS AT BEDTIME 30 mL 3   cephALEXin (KEFLEX) 500 MG capsule Take 1 capsule (500 mg total) by mouth 3 (three) times daily. (Patient not taking: Reported on 04/15/2022) 30 capsule 0   nitrofurantoin (MACRODANTIN) 50 MG capsule Take 1 capsule (50 mg total) by mouth at bedtime. (Patient not taking: Reported on 04/15/2022) 90 capsule 0  No facility-administered medications prior to visit.    Allergies  Allergen Reactions   Other     Nickel staples   Clindamycin Phosphate Nausea And Vomiting    ROS Review of Systems  Constitutional:  Positive for fatigue. Negative for chills and fever.  HENT:  Negative for congestion, sinus pressure, sinus pain and sore throat.   Eyes:  Negative for pain and discharge.  Respiratory:  Negative for cough and shortness of breath.   Cardiovascular:  Negative for chest pain and palpitations.  Gastrointestinal:  Negative for blood in stool, nausea and vomiting.  Endocrine: Negative for polydipsia and polyuria.  Genitourinary:  Positive for urgency. Negative for dysuria, flank pain, frequency and hematuria.  Musculoskeletal:  Negative for neck pain and neck stiffness.  Skin:  Negative for rash.  Neurological:  Positive for weakness. Negative for dizziness and speech difficulty.  Psychiatric/Behavioral:  Negative for agitation and behavioral problems.       Objective:    Physical Exam Vitals reviewed.  Constitutional:      General: She is not in acute distress.    Appearance: She is not diaphoretic.  HENT:     Head: Normocephalic and atraumatic.     Nose: Nose normal.     Mouth/Throat:     Mouth: Mucous membranes are moist.  Eyes:     General: No scleral icterus.    Extraocular Movements: Extraocular movements intact.  Cardiovascular:     Rate and Rhythm: Normal rate and regular rhythm.     Pulses: Normal pulses.     Heart sounds: Normal heart sounds. No murmur heard. Pulmonary:     Breath sounds: Normal  breath sounds. No wheezing or rales.  Abdominal:     Palpations: Abdomen is soft.     Tenderness: There is no abdominal tenderness.  Musculoskeletal:     Cervical back: Neck supple. No tenderness.     Right lower leg: No edema.     Left lower leg: No edema.  Skin:    General: Skin is warm.     Findings: No rash.  Neurological:     General: No focal deficit present.     Mental Status: She is alert and oriented to person, place, and time.     Sensory: No sensory deficit.     Motor: No weakness.  Psychiatric:        Mood and Affect: Mood normal.        Behavior: Behavior normal.     BP 121/66 (BP Location: Left Arm, Patient Position: Sitting, Cuff Size: Large)   Pulse 92   Ht _0  (1.626 m)   Wt 126 lb (57.2 kg)   LMP  (LMP Unknown)   SpO2 93%   BMI 21.63 kg/m  Wt Readings from Last 3 Encounters:  04/15/22 126 lb (57.2 kg)  03/26/22 126 lb (57.2 kg)  12/02/21 119 lb (54 kg)    Lab Results  Component Value Date   TSH 1.300 01/09/2021   Lab Results  Component Value Date   WBC 6.0 03/23/2021   HGB 9.2 (L) 03/23/2021   HCT 30.0 (L) 03/23/2021   MCV 79.8 (L) 03/23/2021   PLT 333 03/23/2021   Lab Results  Component Value Date   NA 141 10/08/2021   K 4.2 10/08/2021   CO2 21 10/08/2021   GLUCOSE 92 10/08/2021   BUN 12 10/08/2021   CREATININE 0.85 10/08/2021   BILITOT 0.5 06/18/2021   ALKPHOS 87 06/18/2021   AST 15 06/18/2021  ALT 12 06/18/2021   PROT 7.5 06/18/2021   ALBUMIN 4.8 (H) 06/18/2021   CALCIUM 9.6 10/08/2021   ANIONGAP 11 03/23/2021   EGFR 71 10/08/2021   Lab Results  Component Value Date   CHOL 190 06/18/2021   Lab Results  Component Value Date   HDL 35 (L) 06/18/2021   Lab Results  Component Value Date   LDLCALC 115 (H) 06/18/2021   Lab Results  Component Value Date   TRIG 225 (H) 06/18/2021   Lab Results  Component Value Date   CHOLHDL 5.4 (H) 06/18/2021   Lab Results  Component Value Date   HGBA1C 6.2 (H) 10/08/2021       Assessment & Plan:   Problem List Items Addressed This Visit       Cardiovascular and Mediastinum   Essential hypertension    BP Readings from Last 1 Encounters:  04/15/22 121/66  Well-controlled with telmisartan 40 mg daily and amlodipine 10 mg daily now Counseled for compliance with the medications Advised DASH diet and moderate exercise/walking as tolerated      Relevant Orders   TSH   CMP14+EGFR   CBC with Differential/Platelet     Endocrine   Type 2 diabetes mellitus with hyperlipidemia ([facility])    Lab Results  Component Value Date   HGBA1C 6.2 (H) 10/08/2021  On Lantus 10 U qHS and Ozempic, on Metformin 1000 mg BID - held for now due to hypoglycemia Advised to check blood glucose with fingersticks for verification of hypoglycemia Advised to follow diabetic diet On ARB and statin Diabetic eye exam: Advised to follow up with Ophthalmology for diabetic eye exam      Relevant Orders   Hemoglobin A1c   CMP14+EGFR   Urine Microalbumin w/creat. ratio   Hypoglycemia    Was thought to be likely due to recent Bactrim She has stopped Lantus and metformin Skipped the dose of Ozempic as well Unclear about persistent hypoglycemia episodes-advised to check blood glucose with fingersticks for now If consistent hypoglycemia noted, will check C-peptide and proinsulin Advised to maintain adequate hydration and eat at regular intervals        Genitourinary   Chronic cystitis    Checked UA and urine culture Avoid Bactrim as it can cause hypoglycemia Plan Pyridium as needed for chronic dysuria Prescribed Keflex for UTI -to be used for worsening of dysuria Referred to urology - needs cystoscopy        Other   Hyperlipidemia    On Lipitor      Relevant Orders   Lipid panel   Encounter for general adult medical examination with abnormal findings - Primary    Physical exam as documented. Fasting blood tests today. Flu and PCV 20 vaccines today.      Relevant Orders    TSH   CMP14+EGFR   CBC with Differential/Platelet   Pneumococcal conjugate vaccine 20-valent (Completed)   Other Visit Diagnoses     Vitamin D deficiency       Relevant Orders   VITAMIN D 25 Hydroxy (Vit-D Deficiency, Fractures)   Immunization due       Need for immunization against influenza       Relevant Orders   Flu Vaccine QUAD High Dose(Fluad) (Completed)       Meds ordered this encounter  Medications   blood glucose meter kit and supplies    Sig: Dispense based on patient and insurance preference. Use up to four times daily as directed. (FOR ICD-10 E10.9, E11.9).  Dispense:  1 each    Refill:  0    Order Specific Question:   Number of strips    Answer:   100    Order Specific Question:   Number of lancets    Answer:   100   Glucose Blood (BLOOD GLUCOSE TEST STRIPS) STRP    Sig: Use to test once daily dx E11.9    Dispense:  100 strip    Refill:  5   Lancets MISC    Sig: Use to test once daily DX: E 11.9    Dispense:  100 each    Refill:  5    Follow-up: Return in about 6 weeks (around 05/27/2022) for DM.    Lindell Spar, MD

## 2022-04-17 LAB — LIPID PANEL
Chol/HDL Ratio: 3.8 ratio (ref 0.0–4.4)
Cholesterol, Total: 191 mg/dL (ref 100–199)
HDL: 50 mg/dL (ref >39)
LDL Chol Calc (NIH): 108 mg/dL — ABNORMAL HIGH (ref 0–99)
Triglycerides: 192 mg/dL — ABNORMAL HIGH (ref 0–149)
VLDL Cholesterol Cal: 33 mg/dL (ref 5–40)

## 2022-04-17 LAB — CBC WITH DIFFERENTIAL/PLATELET
Basophils Absolute: 0.1 10*3/uL (ref 0.0–0.2)
Basos: 1 %
EOS (ABSOLUTE): 0.1 10*3/uL (ref 0.0–0.4)
Eos: 2 %
Hematocrit: 29.1 % — ABNORMAL LOW (ref 34.0–46.6)
Hemoglobin: 9.5 g/dL — ABNORMAL LOW (ref 11.1–15.9)
Immature Grans (Abs): 0 10*3/uL (ref 0.0–0.1)
Immature Granulocytes: 0 %
Lymphocytes Absolute: 2 10*3/uL (ref 0.7–3.1)
Lymphs: 33 %
MCH: 27.6 pg (ref 26.6–33.0)
MCHC: 32.6 g/dL (ref 31.5–35.7)
MCV: 85 fL (ref 79–97)
Monocytes Absolute: 0.5 10*3/uL (ref 0.1–0.9)
Monocytes: 8 %
Neutrophils Absolute: 3.3 10*3/uL (ref 1.4–7.0)
Neutrophils: 56 %
Platelets: 301 10*3/uL (ref 150–450)
RBC: 3.44 x10E6/uL — ABNORMAL LOW (ref 3.77–5.28)
RDW: 16.7 % — ABNORMAL HIGH (ref 11.7–15.4)
WBC: 6 10*3/uL (ref 3.4–10.8)

## 2022-04-17 LAB — CMP14+EGFR
ALT: 12 IU/L (ref 0–32)
AST: 17 IU/L (ref 0–40)
Albumin/Globulin Ratio: 1.6 (ref 1.2–2.2)
Albumin: 4 g/dL (ref 3.8–4.8)
Alkaline Phosphatase: 84 IU/L (ref 44–121)
BUN/Creatinine Ratio: 14 (ref 12–28)
BUN: 12 mg/dL (ref 8–27)
Bilirubin Total: <0.2 mg/dL (ref 0.0–1.2)
CO2: 24 mmol/L (ref 20–29)
Calcium: 9.5 mg/dL (ref 8.7–10.3)
Chloride: 103 mmol/L (ref 96–106)
Creatinine, Ser: 0.84 mg/dL (ref 0.57–1.00)
Globulin, Total: 2.5 g/dL (ref 1.5–4.5)
Glucose: 125 mg/dL — ABNORMAL HIGH (ref 70–99)
Potassium: 4.3 mmol/L (ref 3.5–5.2)
Sodium: 141 mmol/L (ref 134–144)
Total Protein: 6.5 g/dL (ref 6.0–8.5)
eGFR: 72 mL/min/{1.73_m2} (ref >59)

## 2022-04-17 LAB — HEMOGLOBIN A1C
Est. average glucose Bld gHb Est-mCnc: 128 mg/dL
Hgb A1c MFr Bld: 6.1 % — ABNORMAL HIGH (ref 4.8–5.6)

## 2022-04-17 LAB — MICROALBUMIN / CREATININE URINE RATIO
Creatinine, Urine: 181.6 mg/dL
Microalb/Creat Ratio: 66 mg/g creat — ABNORMAL HIGH (ref 0–29)
Microalbumin, Urine: 119.6 ug/mL

## 2022-04-17 LAB — TSH: TSH: 1.22 u[IU]/mL (ref 0.450–4.500)

## 2022-04-17 LAB — VITAMIN D 25 HYDROXY (VIT D DEFICIENCY, FRACTURES): Vit D, 25-Hydroxy: 32.2 ng/mL (ref 30.0–100.0)

## 2022-04-21 ENCOUNTER — Other Ambulatory Visit: Payer: Medicare Other | Admitting: Urology

## 2022-05-08 ENCOUNTER — Other Ambulatory Visit: Payer: Self-pay | Admitting: Internal Medicine

## 2022-05-08 DIAGNOSIS — E1169 Type 2 diabetes mellitus with other specified complication: Secondary | ICD-10-CM

## 2022-05-16 ENCOUNTER — Telehealth: Payer: Self-pay | Admitting: Internal Medicine

## 2022-05-16 NOTE — Telephone Encounter (Signed)
Pt called stating she is wanting to know if Dr. [name] can send phar a list of her medication and what she is taking them for so that the phar will label each bottle with what is for?

## 2022-05-16 NOTE — Telephone Encounter (Signed)
Spoke with pharmacy, they have her medications and diagnoses.

## 2022-05-19 ENCOUNTER — Other Ambulatory Visit: Payer: Self-pay | Admitting: Internal Medicine

## 2022-05-19 DIAGNOSIS — E1169 Type 2 diabetes mellitus with other specified complication: Secondary | ICD-10-CM

## 2022-05-19 DIAGNOSIS — E162 Hypoglycemia, unspecified: Secondary | ICD-10-CM

## 2022-05-27 ENCOUNTER — Ambulatory Visit: Payer: Medicare Other | Admitting: Internal Medicine

## 2022-06-04 ENCOUNTER — Other Ambulatory Visit: Payer: Medicare Other | Admitting: Urology

## 2022-06-04 DIAGNOSIS — N3946 Mixed incontinence: Secondary | ICD-10-CM

## 2022-06-11 ENCOUNTER — Encounter: Payer: Self-pay | Admitting: Internal Medicine

## 2022-06-11 ENCOUNTER — Ambulatory Visit (INDEPENDENT_AMBULATORY_CARE_PROVIDER_SITE_OTHER): Payer: 59 | Admitting: Internal Medicine

## 2022-06-11 VITALS — BP 137/81 | HR 80 | Ht 64.0 in | Wt 127.0 lb

## 2022-06-11 DIAGNOSIS — R053 Chronic cough: Secondary | ICD-10-CM

## 2022-06-11 DIAGNOSIS — E785 Hyperlipidemia, unspecified: Secondary | ICD-10-CM | POA: Diagnosis not present

## 2022-06-11 DIAGNOSIS — E1149 Type 2 diabetes mellitus with other diabetic neurological complication: Secondary | ICD-10-CM

## 2022-06-11 DIAGNOSIS — N3020 Other chronic cystitis without hematuria: Secondary | ICD-10-CM | POA: Diagnosis not present

## 2022-06-11 DIAGNOSIS — E1169 Type 2 diabetes mellitus with other specified complication: Secondary | ICD-10-CM

## 2022-06-11 DIAGNOSIS — I10 Essential (primary) hypertension: Secondary | ICD-10-CM

## 2022-06-11 DIAGNOSIS — R062 Wheezing: Secondary | ICD-10-CM | POA: Diagnosis not present

## 2022-06-11 MED ORDER — ALBUTEROL SULFATE HFA 108 (90 BASE) MCG/ACT IN AERS: 2.0000 | INHALATION_SPRAY | Freq: Four times a day (QID) | RESPIRATORY_TRACT | 0 refills | Status: DC | PRN

## 2022-06-11 NOTE — Patient Instructions (Addendum)
Please continue taking medications as prescribed.  Please continue to follow low carb diet and ambulate as tolerated.  Please take Mucinex as needed for cough.

## 2022-06-11 NOTE — Assessment & Plan Note (Signed)
On Gabapentin 100 mg QD Still has numbness at times, patient prefers to stay on same dose for now.

## 2022-06-11 NOTE — Assessment & Plan Note (Addendum)
Lab Results  Component Value Date   HGBA1C 6.1 (H) 04/15/2022   Well-controlled Was on Lantus 10 U qHS and on Metformin 1000 mg BID - discontinued due to hypoglycemia Continue Ozempic 1 mg qw Advised to check blood glucose with fingersticks for verification of hypoglycemia Advised to follow diabetic diet On ARB and statin Diabetic eye exam: Advised to follow up with Ophthalmology for diabetic eye exam

## 2022-06-11 NOTE — Assessment & Plan Note (Signed)
BP Readings from Last 1 Encounters:  06/11/22 137/81   Well-controlled with telmisartan 40 mg daily and amlodipine 10 mg daily now Counseled for compliance with the medications Advised DASH diet and moderate exercise/walking as tolerated

## 2022-06-13 NOTE — Assessment & Plan Note (Addendum)
Recently worse from viral infection/post-infectious cough Mucinex PRN for now Albuterol PRN for wheezing/?reactive airway disease

## 2022-06-13 NOTE — Assessment & Plan Note (Signed)
Pyridium as needed for chronic dysuria Prescribed Keflex for UTI -to be used for worsening of dysuria Had referred to urology - needs cystoscopy

## 2022-06-13 NOTE — Progress Notes (Signed)
Established Patient Office Visit  Subjective:  Patient ID: Jessica Mclean, female    DOB: May 16, 1946  Age: 77 y.o. MRN: [id number]  CC:  Chief Complaint  Patient presents with   Diabetes    Six week follow up, she states she is not taking her diabetic medication. Patient also reports of having a cough for seven weeks, still on going and not getting relief, not sleeping at night. She had a period of vomiting and diarrhea, she states she feels weak today.    HPI Jessica Mclean is a 77 y.o. female with past medical history of HTN, DM2 on insulin, TIA, HLD and cervical ca. s/p radiation who presents for f/u of her chronic medical conditions.  Type II DM: She had been having hypoglycemia episodes despite stopping Lantus. She has also stopped taking Metformin. With Ozempic only, her blood glucose has been around 110-150 now. Her 7-day average is 116.  Of note, she admits that she has only 1 meal in a day.  She states that she had hypoglycemia alerts from her CGM every night and had to wake up to take snacks, but have not had any episode of hypoglycemia in the last 2 weeks.  She has not check blood glucose with regular fingerstick recently.  She had likely viral infection about 2 weeks ago, and was having fever, cough, nausea/vomiting and diarrhea for 1 week.  Her symptoms have resolved now except cough, for which she takes Mucinex PRN. She reports episodes of wheezing at times, but denies dyspnea currently.  HTN: BP is well-controlled. Takes medications regularly. Patient denies headache, dizziness, chest pain, dyspnea or palpitations.  Chronic cystitis: She takes Macrobid for it. She has tried taking Pyridium for dysuria, but continues to have it. Denies any hematuria, flank or pelvic pain currently.     Past Medical History:  Diagnosis Date   Bleeding disorder ([place])    Cervical cancer ([place]) 2010   Diabetes mellitus without complication ([place])    type 2   Diabetes mellitus without  complication ([place])    Heart murmur    High cholesterol    HLD (hyperlipidemia)    Hypertension    Mental disorder    depression; suicidal ideation in 2017   Sleep apnea    Vitamin D deficiency     Past Surgical History:  Procedure Laterality Date   BILATERAL OOPHORECTOMY     CESAREAN SECTION     x 4   CHOLECYSTECTOMY     COLOSTOMY     x2   CYSTOSCOPY WITH URETHRAL DILATATION N/A 11/15/2020   Procedure: CYSTOSCOPY WITH URETHRAL DILATION;  Surgeon: Cleon Gustin, MD;  Location: [facility];  Service: Urology;  Laterality: N/A;   SALPINGECTOMY     TONSILLECTOMY     TONSILLECTOMY AND ADENOIDECTOMY      Family History  Problem Relation Age of Onset   Cancer Father        kidney   Cancer Mother        lung   Other Brother        heart issues    Social History   Socioeconomic History   Marital status: Divorced    Spouse name: Not on file   Number of children: 3   Years of education: Not on file   Highest education level: Not on file  Occupational History   Occupation: retired  Tobacco Use   Smoking status: Never   Smokeless tobacco: Never  Vaping Use   Vaping Use: Never  used  Substance and Sexual Activity   Alcohol use: Never   Drug use: Never   Sexual activity: Not Currently    Birth control/protection: Post-menopausal  Other Topics Concern   Not on file  Social History Narrative   ** Merged History Encounter **       Social Determinants of Health   Financial Resource Strain: Medium Risk (07/31/2020)   Overall Financial Resource Strain (CARDIA)    Difficulty of Paying Living Expenses: Somewhat hard  Food Insecurity: Food Insecurity Present (07/31/2020)   Hunger Vital Sign    Worried About [place] in the Last Year: Sometimes true    Ran Out of Food in the Last Year: Sometimes true  Transportation Needs: No Transportation Needs (07/31/2020)   PRAPARE - Hydrologist (Medical): No    Lack of Transportation  (Non-Medical): No  Physical Activity: Insufficiently Active (07/31/2020)   Exercise Vital Sign    Days of Exercise per Week: 4 days    Minutes of Exercise per Session: 30 min  Stress: No Stress Concern Present (07/31/2020)   [place]    Feeling of Stress : Only a little  Social Connections: Socially Isolated (07/31/2020)   Social Connection and Isolation Panel [NHANES]    Frequency of Communication with Friends and Family: More than three times a week    Frequency of Social Gatherings with Friends and Family: Twice a week    Attends Religious Services: Never    Marine scientist or Organizations: No    Attends Archivist Meetings: Never    Marital Status: Widowed  Human resources officer Violence: Not on file    Outpatient Medications Prior to Visit  Medication Sig Dispense Refill   amLODipine (NORVASC) 10 MG tablet TAKE 1 TABLET BY MOUTH  DAILY 100 tablet 2   aspirin-acetaminophen-caffeine (EXCEDRIN EXTRA STRENGTH) 250-250-65 MG tablet Take 2 tablets by mouth in the morning.     atorvastatin (LIPITOR) 80 MG tablet TAKE 1 TABLET BY MOUTH DAILY 100 tablet 2   blood glucose meter kit and supplies Dispense based on patient and insurance preference. Use up to four times daily as directed. (FOR ICD-10 E10.9, E11.9). 1 each 0   Blood Glucose Monitoring Suppl (ACCU-CHEK GUIDE) w/Device KIT      calcium-vitamin D (OSCAL-500) 500-400 MG-UNIT tablet Take 1 tablet by mouth daily.     cephALEXin (KEFLEX) 500 MG capsule Take 1 capsule (500 mg total) by mouth 3 (three) times daily. (Patient not taking: Reported on 04/15/2022) 30 capsule 0   Continuous Blood Gluc Receiver (DEXCOM G7 RECEIVER) DEVI Use it to check blood glucose 3 times before meals, at bedtime and as needed. 1 each 0   Continuous Blood Gluc Sensor (FREESTYLE LIBRE 2 SENSOR) MISC CHANGE SENSOR EVERY 14 DAYS 8 each 2   Glucose Blood (BLOOD GLUCOSE TEST STRIPS) STRP  Use to test once daily dx E11.9 100 strip 5   Lancets MISC Use to test once daily DX: E 11.9 100 each 5   meclizine (ANTIVERT) 25 MG tablet Take 1 tablet (25 mg total) by mouth 3 (three) times daily as needed for dizziness. 30 tablet 1   Multiple Vitamins-Minerals (PRESERVISION AREDS PO) Take 2 capsules by mouth daily.     omega-3 acid ethyl esters (LOVAZA) 1 g capsule Take 2 g by mouth 2 (two) times daily.     OZEMPIC, 1 MG/DOSE, 4 MG/3ML SOPN INJECT SUBCUTANEOUSLY  1 MG EVERY WEEK 9 mL 3   phenazopyridine (PYRIDIUM) 100 MG tablet Take 1 tablet (100 mg total) by mouth 3 (three) times daily as needed for pain (Dysuria). 90 tablet 5   polyethylene glycol powder (GLYCOLAX/MIRALAX) 17 GM/SCOOP powder Take 17 g by mouth daily as needed for mild constipation or moderate constipation.      tamsulosin (FLOMAX) 0.4 MG CAPS capsule Take 1 capsule (0.4 mg total) by mouth daily after supper. 30 capsule 11   telmisartan (MICARDIS) 40 MG tablet TAKE 1 TABLET BY MOUTH DAILY 100 tablet 2   metFORMIN (GLUCOPHAGE) 1000 MG tablet TAKE 1 TABLET BY MOUTH TWICE  DAILY WITH MEALS 200 tablet 2   No facility-administered medications prior to visit.    Allergies  Allergen Reactions   Other     Nickel staples   Bactrim [Sulfamethoxazole-Trimethoprim]     Hypoglycemia   Clindamycin Phosphate Nausea And Vomiting    ROS Review of Systems  Constitutional:  Positive for fatigue. Negative for chills and fever.  HENT:  Negative for congestion, sinus pressure, sinus pain and sore throat.   Eyes:  Negative for pain and discharge.  Respiratory:  Positive for cough. Negative for shortness of breath.   Cardiovascular:  Negative for chest pain and palpitations.  Gastrointestinal:  Negative for blood in stool, nausea and vomiting.  Endocrine: Negative for polydipsia and polyuria.  Genitourinary:  Positive for urgency. Negative for dysuria, flank pain, frequency and hematuria.  Musculoskeletal:  Negative for neck pain and  neck stiffness.  Skin:  Negative for rash.  Neurological:  Negative for dizziness, speech difficulty and weakness.  Psychiatric/Behavioral:  Negative for agitation and behavioral problems.       Objective:    Physical Exam Vitals reviewed.  Constitutional:      General: She is not in acute distress.    Appearance: She is not diaphoretic.  HENT:     Head: Normocephalic and atraumatic.     Nose: Nose normal.     Mouth/Throat:     Mouth: Mucous membranes are moist.  Eyes:     General: No scleral icterus.    Extraocular Movements: Extraocular movements intact.  Cardiovascular:     Rate and Rhythm: Normal rate and regular rhythm.     Pulses: Normal pulses.     Heart sounds: Normal heart sounds. No murmur heard. Pulmonary:     Breath sounds: No wheezing or rales.  Musculoskeletal:     Cervical back: Neck supple. No tenderness.     Right lower leg: No edema.     Left lower leg: No edema.  Skin:    General: Skin is warm.     Findings: No rash.  Neurological:     General: No focal deficit present.     Mental Status: She is alert and oriented to person, place, and time.     Sensory: Sensory deficit (B/l feet) present.     Motor: No weakness.  Psychiatric:        Mood and Affect: Mood normal.        Behavior: Behavior normal.     BP 137/81 (BP Location: Right Arm, Patient Position: Sitting, Cuff Size: Normal)   Pulse 80   Ht '5\' 4"'$  (1.626 m)   Wt 127 lb (57.6 kg)   LMP  (LMP Unknown)   SpO2 96%   BMI 21.80 kg/m  Wt Readings from Last 3 Encounters:  06/11/22 127 lb (57.6 kg)  04/15/22 126 lb (57.2 kg)  03/26/22  126 lb (57.2 kg)    Lab Results  Component Value Date   TSH 1.220 04/15/2022   Lab Results  Component Value Date   WBC 6.0 04/15/2022   HGB 9.5 (L) 04/15/2022   HCT 29.1 (L) 04/15/2022   MCV 85 04/15/2022   PLT 301 04/15/2022   Lab Results  Component Value Date   NA 141 04/15/2022   K 4.3 04/15/2022   CO2 24 04/15/2022   GLUCOSE 125 (H)  04/15/2022   BUN 12 04/15/2022   CREATININE 0.84 04/15/2022   BILITOT <0.2 04/15/2022   ALKPHOS 84 04/15/2022   AST 17 04/15/2022   ALT 12 04/15/2022   PROT 6.5 04/15/2022   ALBUMIN 4.0 04/15/2022   CALCIUM 9.5 04/15/2022   ANIONGAP 11 03/23/2021   EGFR 72 04/15/2022   Lab Results  Component Value Date   CHOL 191 04/15/2022   Lab Results  Component Value Date   HDL 50 04/15/2022   Lab Results  Component Value Date   LDLCALC 108 (H) 04/15/2022   Lab Results  Component Value Date   TRIG 192 (H) 04/15/2022   Lab Results  Component Value Date   CHOLHDL 3.8 04/15/2022   Lab Results  Component Value Date   HGBA1C 6.1 (H) 04/15/2022      Assessment & Plan:   Problem List Items Addressed This Visit       Cardiovascular and Mediastinum   Essential hypertension - Primary    BP Readings from Last 1 Encounters:  06/11/22 137/81  Well-controlled with telmisartan 40 mg daily and amlodipine 10 mg daily now Counseled for compliance with the medications Advised DASH diet and moderate exercise/walking as tolerated        Endocrine   Type 2 diabetes mellitus with hyperlipidemia ([facility])    Lab Results  Component Value Date   HGBA1C 6.1 (H) 04/15/2022  Well-controlled Was on Lantus 10 U qHS and on Metformin 1000 mg BID - discontinued due to hypoglycemia Continue Ozempic 1 mg qw Advised to check blood glucose with fingersticks for verification of hypoglycemia Advised to follow diabetic diet On ARB and statin Diabetic eye exam: Advised to follow up with Ophthalmology for diabetic eye exam      Diabetic neuropathy ([place])    On Gabapentin 100 mg QD Still has numbness at times, patient prefers to stay on same dose for now.        Genitourinary   Chronic cystitis    Pyridium as needed for chronic dysuria Prescribed Keflex for UTI -to be used for worsening of dysuria Had referred to urology - needs cystoscopy        Other   Chronic cough    Recently worse from  viral infection/post-infectious cough Mucinex PRN for now Albuterol PRN for wheezing/?reactive airway disease      Other Visit Diagnoses     Wheezing       Relevant Medications   albuterol (VENTOLIN HFA) 108 (90 Base) MCG/ACT inhaler       Meds ordered this encounter  Medications   albuterol (VENTOLIN HFA) 108 (90 Base) MCG/ACT inhaler    Sig: Inhale 2 puffs into the lungs every 6 (six) hours as needed for wheezing or shortness of breath.    Dispense:  8 g    Refill:  0    Okay to substitute to generic/formulary Albuterol.    Follow-up: Return in about 4 months (around 10/10/2022) for DM and HTN.    Lindell Spar, MD

## 2022-06-15 ENCOUNTER — Other Ambulatory Visit: Payer: Self-pay | Admitting: Internal Medicine

## 2022-06-15 DIAGNOSIS — I10 Essential (primary) hypertension: Secondary | ICD-10-CM

## 2022-06-23 ENCOUNTER — Other Ambulatory Visit: Payer: Self-pay | Admitting: Internal Medicine

## 2022-06-23 ENCOUNTER — Telehealth: Payer: Self-pay | Admitting: Internal Medicine

## 2022-06-23 DIAGNOSIS — E1149 Type 2 diabetes mellitus with other diabetic neurological complication: Secondary | ICD-10-CM

## 2022-06-23 MED ORDER — GABAPENTIN 100 MG PO CAPS: 100.0000 mg | ORAL_CAPSULE | Freq: Every day | ORAL | 3 refills | Status: DC

## 2022-06-23 NOTE — Telephone Encounter (Signed)
Patient called need med refill   gabapentin (NEURONTIN) 100 MG capsule [[id number]]    Pharmacy  [facility] ([place], [place] [facility] [address] [place] [address], [place] [address] Phone: [phone]  Fax: [phone]

## 2022-07-02 ENCOUNTER — Telehealth: Payer: Self-pay | Admitting: Internal Medicine

## 2022-07-02 NOTE — Telephone Encounter (Signed)
Patient is unsure if medication needs PA or not. As of right now we have not received anything stating medication needs PA

## 2022-07-02 NOTE — Telephone Encounter (Signed)
Patient called in regard to [facility], 1 MG/DOSE, 4 MG/3ML SOPN [id number]    Thinks med is now requiring per [place]. Patient has lost the papers that came in regard to med so is not 100% sure.

## 2022-07-25 ENCOUNTER — Other Ambulatory Visit: Payer: Self-pay

## 2022-07-25 ENCOUNTER — Telehealth: Payer: Self-pay | Admitting: Internal Medicine

## 2022-07-25 DIAGNOSIS — E1169 Type 2 diabetes mellitus with other specified complication: Secondary | ICD-10-CM

## 2022-07-25 DIAGNOSIS — E162 Hypoglycemia, unspecified: Secondary | ICD-10-CM

## 2022-07-25 DIAGNOSIS — E1149 Type 2 diabetes mellitus with other diabetic neurological complication: Secondary | ICD-10-CM

## 2022-07-25 MED ORDER — FREESTYLE LIBRE 2 SENSOR MISC: 2 refills | Status: DC

## 2022-07-25 NOTE — Telephone Encounter (Signed)
Sent to pharmacy 

## 2022-07-25 NOTE — Telephone Encounter (Signed)
Pt called stating that her dexcom brand was going to be changed & phar wouldn't send due to having just refilled the old kind. On last one of old kind. Can you please send the order over again for the new ones?

## 2022-08-06 ENCOUNTER — Telehealth: Payer: Self-pay | Admitting: Internal Medicine

## 2022-08-06 NOTE — Telephone Encounter (Signed)
[facility] has order ready for patient

## 2022-08-06 NOTE — Telephone Encounter (Signed)
Patient called needs a glucose monitor she on her last one. Patient asked for nurse to return her call has a question [phone].

## 2022-08-11 ENCOUNTER — Encounter: Payer: 59 | Admitting: Family Medicine

## 2022-09-05 ENCOUNTER — Other Ambulatory Visit: Payer: Self-pay | Admitting: Internal Medicine

## 2022-09-05 DIAGNOSIS — E1169 Type 2 diabetes mellitus with other specified complication: Secondary | ICD-10-CM

## 2022-09-10 ENCOUNTER — Ambulatory Visit: Payer: 59 | Admitting: Internal Medicine

## 2022-09-11 ENCOUNTER — Encounter: Payer: Self-pay | Admitting: Internal Medicine

## 2022-09-17 ENCOUNTER — Ambulatory Visit (INDEPENDENT_AMBULATORY_CARE_PROVIDER_SITE_OTHER): Payer: 59 | Admitting: Internal Medicine

## 2022-09-17 ENCOUNTER — Encounter: Payer: Self-pay | Admitting: Internal Medicine

## 2022-09-17 VITALS — BP 138/74 | HR 80 | Ht 64.5 in | Wt 133.2 lb

## 2022-09-17 DIAGNOSIS — I10 Essential (primary) hypertension: Secondary | ICD-10-CM | POA: Diagnosis not present

## 2022-09-17 DIAGNOSIS — R5382 Chronic fatigue, unspecified: Secondary | ICD-10-CM

## 2022-09-17 DIAGNOSIS — R4701 Aphasia: Secondary | ICD-10-CM | POA: Diagnosis not present

## 2022-09-17 DIAGNOSIS — L247 Irritant contact dermatitis due to plants, except food: Secondary | ICD-10-CM | POA: Diagnosis not present

## 2022-09-17 DIAGNOSIS — E162 Hypoglycemia, unspecified: Secondary | ICD-10-CM

## 2022-09-17 DIAGNOSIS — E785 Hyperlipidemia, unspecified: Secondary | ICD-10-CM | POA: Diagnosis not present

## 2022-09-17 DIAGNOSIS — E1169 Type 2 diabetes mellitus with other specified complication: Secondary | ICD-10-CM

## 2022-09-17 DIAGNOSIS — N3020 Other chronic cystitis without hematuria: Secondary | ICD-10-CM

## 2022-09-17 DIAGNOSIS — Z8673 Personal history of transient ischemic attack (TIA), and cerebral infarction without residual deficits: Secondary | ICD-10-CM | POA: Diagnosis not present

## 2022-09-17 MED ORDER — DEXCOM G7 SENSOR MISC
5 refills | Status: DC
Start: 2022-09-17 — End: 2023-01-16

## 2022-09-17 MED ORDER — DESONIDE 0.05 % EX CREA
TOPICAL_CREAM | Freq: Two times a day (BID) | CUTANEOUS | 1 refills | Status: DC
Start: 2022-09-17 — End: 2022-12-22

## 2022-09-17 NOTE — Assessment & Plan Note (Signed)
Lab Results  Component Value Date   HGBA1C 6.1 (H) 04/15/2022   Well-controlled Was on Lantus 10 U qHS and on Metformin 1000 mg BID - discontinued due to hypoglycemia Discontinue Ozempic 1 mg qw due to hypoglycemia Needs CGM to monitor hypoglycemia Advised to follow diabetic diet On ARB and statin Diabetic eye exam: Advised to follow up with Ophthalmology for diabetic eye exam

## 2022-09-17 NOTE — Assessment & Plan Note (Signed)
Uses Desonide cream, refilled

## 2022-09-17 NOTE — Assessment & Plan Note (Addendum)
Has sudden change in her voice quality compared to the last visit Unclear if related to a new CVA - check MRI brain Has worsening of fatigue and visual disturbance as well, progressive during the day, check AChE inhibitor antibody Referred to neurology Referred to speech therapy

## 2022-09-17 NOTE — Assessment & Plan Note (Signed)
Pyridium as needed for chronic dysuria Prescribed Keflex for UTI -to be used for worsening of dysuria Had referred to urology - needs cystoscopy 

## 2022-09-17 NOTE — Assessment & Plan Note (Signed)
Unclear about persistent hypoglycemia episodes-advised to check blood glucose with fingersticks as well DC Lantus, Ozempic and Metformin If consistent hypoglycemia noted, will check C-peptide and proinsulin Advised to maintain adequate hydration and eat at regular intervals

## 2022-09-17 NOTE — Assessment & Plan Note (Signed)
H/o TIA in 12/2019 On Aspirin and statin Has speech difficulty and chronic weakness, check MRI brain

## 2022-09-17 NOTE — Patient Instructions (Addendum)
Please continue to take medications as prescribed.  Please continue to follow low carb diet and ambulate as tolerated.  Please schedule medicare annual wellness.

## 2022-09-17 NOTE — Progress Notes (Signed)
Established Patient Office Visit  Subjective:  Patient ID: Jessica Mclean, female    DOB: Aug 19, 1945  Age: 77 y.o. MRN: [id number]  CC:  Chief Complaint  Patient presents with   Hypertension    3 month f/u    Diabetes    Follow up.     HPI Jessica Mclean is a 77 y.o. female with past medical history of HTN, DM2 on insulin, TIA, HLD and cervical ca. s/p radiation who presents for f/u of her chronic medical conditions.  She reports extreme fatigue, worse as the day progresses.  She also reports visual disturbance and speech difficulty, which has been progressive.  She has history of CVA in the past.  She has difficulty with speech for the last 4 months.  Denies any new focal neurologic deficit. She has chronic weakness of the b/l LE.  Type II DM: She had been having hypoglycemia episodes despite stopping Lantus. She has also stopped taking Metformin and Ozempic. Of note, she admits that she has only 1 meal in a day.  She states that she had hypoglycemia alerts from her CGM every night and had to wake up to take snacks while taking Ozempic, but have not had any episode of hypoglycemia recently.  She has not checked blood glucose with regular fingerstick recently. She has run out of Dexcom sensors as well.  HTN: BP is well-controlled. Takes medications regularly. Patient denies headache, dizziness, chest pain, dyspnea or palpitations.  Chronic cystitis: She takes Keflex for it for acute UTI.  She has been taking Pyridium for dysuria. Denies any hematuria, flank or pelvic pain currently.     Past Medical History:  Diagnosis Date   Bleeding disorder (HCC)    Cervical cancer (HCC) 2010   Diabetes mellitus without complication (HCC)    type 2   Diabetes mellitus without complication (HCC)    Heart murmur    High cholesterol    HLD (hyperlipidemia)    Hypertension    Mental disorder    depression; suicidal ideation in 2017   Sleep apnea    Vitamin D deficiency     Past Surgical  History:  Procedure Laterality Date   BILATERAL OOPHORECTOMY     CESAREAN SECTION     x 4   CHOLECYSTECTOMY     COLOSTOMY     x2   CYSTOSCOPY WITH URETHRAL DILATATION N/A 11/15/2020   Procedure: CYSTOSCOPY WITH URETHRAL DILATION;  Surgeon: Malen Gauze, MD;  Location: [facility];  Service: Urology;  Laterality: N/A;   SALPINGECTOMY     TONSILLECTOMY     TONSILLECTOMY AND ADENOIDECTOMY      Family History  Problem Relation Age of Onset   Cancer Father        kidney   Cancer Mother        lung   Other Brother        heart issues    Social History   Socioeconomic History   Marital status: Divorced    Spouse name: Not on file   Number of children: 3   Years of education: Not on file   Highest education level: Not on file  Occupational History   Occupation: retired  Tobacco Use   Smoking status: Never   Smokeless tobacco: Never  Vaping Use   Vaping Use: Never used  Substance and Sexual Activity   Alcohol use: Never   Drug use: Never   Sexual activity: Not Currently    Birth control/protection: Post-menopausal  Other Topics  Concern   Not on file  Social History Narrative   ** Merged History Encounter **       Social Determinants of Health   Financial Resource Strain: Medium Risk (07/31/2020)   Overall Financial Resource Strain (CARDIA)    Difficulty of Paying Living Expenses: Somewhat hard  Food Insecurity: Food Insecurity Present (07/31/2020)   Hunger Vital Sign    Worried About Running Out of Food in the Last Year: Sometimes true    Ran Out of Food in the Last Year: Sometimes true  Transportation Needs: No Transportation Needs (07/31/2020)   PRAPARE - Administrator, Civil Service (Medical): No    Lack of Transportation (Non-Medical): No  Physical Activity: Insufficiently Active (07/31/2020)   Exercise Vital Sign    Days of Exercise per Week: 4 days    Minutes of Exercise per Session: 30 min  Stress: No Stress Concern Present (07/31/2020)    [employer] of Occupational Health - Occupational Stress Questionnaire    Feeling of Stress : Only a little  Social Connections: Socially Isolated (07/31/2020)   Social Connection and Isolation Panel [NHANES]    Frequency of Communication with Friends and Family: More than three times a week    Frequency of Social Gatherings with Friends and Family: Twice a week    Attends Religious Services: Never    Database administrator or Organizations: No    Attends Banker Meetings: Never    Marital Status: Widowed  Intimate Partner Violence: Not on file    Outpatient Medications Prior to Visit  Medication Sig Dispense Refill   albuterol (VENTOLIN HFA) 108 (90 Base) MCG/ACT inhaler Inhale 2 puffs into the lungs every 6 (six) hours as needed for wheezing or shortness of breath. 8 g 0   amLODipine (NORVASC) 10 MG tablet TAKE 1 TABLET BY MOUTH DAILY 100 tablet 2   aspirin-acetaminophen-caffeine (EXCEDRIN EXTRA STRENGTH) 250-250-65 MG tablet Take 2 tablets by mouth in the morning.     atorvastatin (LIPITOR) 80 MG tablet TAKE 1 TABLET BY MOUTH DAILY 100 tablet 2   blood glucose meter kit and supplies Dispense based on patient and insurance preference. Use up to four times daily as directed. (FOR ICD-10 E10.9, E11.9). 1 each 0   Blood Glucose Monitoring Suppl (ACCU-CHEK GUIDE) w/Device KIT      calcium-vitamin D (OSCAL-500) 500-400 MG-UNIT tablet Take 1 tablet by mouth daily.     cephALEXin (KEFLEX) 500 MG capsule Take 1 capsule (500 mg total) by mouth 3 (three) times daily. 30 capsule 0   Continuous Blood Gluc Receiver (DEXCOM G7 RECEIVER) DEVI Use it to check blood glucose 3 times before meals, at bedtime and as needed. 1 each 0   gabapentin (NEURONTIN) 100 MG capsule Take 1 capsule (100 mg total) by mouth at bedtime. 90 capsule 3   Glucose Blood (BLOOD GLUCOSE TEST STRIPS) STRP Use to test once daily dx E11.9 100 strip 5   Lancets MISC Use to test once daily DX: E 11.9 100 each 5    meclizine (ANTIVERT) 25 MG tablet Take 1 tablet (25 mg total) by mouth 3 (three) times daily as needed for dizziness. 30 tablet 1   Multiple Vitamins-Minerals (PRESERVISION AREDS PO) Take 2 capsules by mouth daily.     omega-3 acid ethyl esters (LOVAZA) 1 g capsule Take 2 g by mouth 2 (two) times daily.     phenazopyridine (PYRIDIUM) 100 MG tablet Take 1 tablet (100 mg total) by mouth 3 (  three) times daily as needed for pain (Dysuria). 90 tablet 5   polyethylene glycol powder (GLYCOLAX/MIRALAX) 17 GM/SCOOP powder Take 17 g by mouth daily as needed for mild constipation or moderate constipation.      tamsulosin (FLOMAX) 0.4 MG CAPS capsule Take 1 capsule (0.4 mg total) by mouth daily after supper. 30 capsule 11   telmisartan (MICARDIS) 40 MG tablet TAKE 1 TABLET BY MOUTH DAILY 100 tablet 2   Continuous Blood Gluc Sensor (FREESTYLE LIBRE 2 SENSOR) MISC CHANGE SENSOR EVERY 14 DAYS 8 each 2   OZEMPIC, 1 MG/DOSE, 4 MG/3ML SOPN INJECT SUBCUTANEOUSLY 1 MG  WEEKLY 9 mL 3   No facility-administered medications prior to visit.    Allergies  Allergen Reactions   Other     Nickel staples   Bactrim [Sulfamethoxazole-Trimethoprim]     Hypoglycemia   Clindamycin Phosphate Nausea And Vomiting    ROS Review of Systems  Constitutional:  Positive for fatigue. Negative for chills and fever.  HENT:  Positive for voice change. Negative for congestion, sinus pressure, sinus pain and sore throat.   Eyes:  Negative for pain and discharge.  Respiratory:  Negative for cough and shortness of breath.   Cardiovascular:  Negative for chest pain and palpitations.  Gastrointestinal:  Negative for blood in stool, nausea and vomiting.  Endocrine: Negative for polydipsia and polyuria.  Genitourinary:  Positive for urgency. Negative for dysuria, flank pain, frequency and hematuria.  Musculoskeletal:  Negative for neck pain and neck stiffness.  Skin:  Negative for rash.  Neurological:  Positive for speech difficulty.  Negative for dizziness and weakness.  Psychiatric/Behavioral:  Negative for agitation and behavioral problems.       Objective:    Physical Exam Vitals reviewed.  Constitutional:      General: She is not in acute distress.    Appearance: She is not diaphoretic.  HENT:     Head: Normocephalic and atraumatic.     Nose: Nose normal.     Mouth/Throat:     Mouth: Mucous membranes are moist.  Eyes:     General: No scleral icterus.    Extraocular Movements: Extraocular movements intact.  Cardiovascular:     Rate and Rhythm: Normal rate and regular rhythm.     Pulses: Normal pulses.     Heart sounds: Normal heart sounds. No murmur heard. Pulmonary:     Breath sounds: No wheezing or rales.  Musculoskeletal:     Cervical back: Neck supple. No tenderness.     Right lower leg: No edema.     Left lower leg: No edema.  Skin:    General: Skin is warm.     Findings: No rash.  Neurological:     General: No focal deficit present.     Mental Status: She is alert and oriented to person, place, and time.     Sensory: Sensory deficit (B/l feet) present.     Motor: No weakness.     Comments: Slurred speech  Psychiatric:        Mood and Affect: Mood normal.        Behavior: Behavior normal.     BP 138/74 (BP Location: Left Arm)   Pulse 80   Ht 5' 4.5" (1.638 m)   Wt 133 lb 3.2 oz (60.4 kg)   LMP  (LMP Unknown)   SpO2 96%   BMI 22.51 kg/m  Wt Readings from Last 3 Encounters:  09/17/22 133 lb 3.2 oz (60.4 kg)  06/11/22 127 lb (57.6  kg)  04/15/22 126 lb (57.2 kg)    Lab Results  Component Value Date   TSH 1.220 04/15/2022   Lab Results  Component Value Date   WBC 6.0 04/15/2022   HGB 9.5 (L) 04/15/2022   HCT 29.1 (L) 04/15/2022   MCV 85 04/15/2022   PLT 301 04/15/2022   Lab Results  Component Value Date   NA 141 04/15/2022   K 4.3 04/15/2022   CO2 24 04/15/2022   GLUCOSE 125 (H) 04/15/2022   BUN 12 04/15/2022   CREATININE 0.84 04/15/2022   BILITOT <0.2 04/15/2022    ALKPHOS 84 04/15/2022   AST 17 04/15/2022   ALT 12 04/15/2022   PROT 6.5 04/15/2022   ALBUMIN 4.0 04/15/2022   CALCIUM 9.5 04/15/2022   ANIONGAP 11 03/23/2021   EGFR 72 04/15/2022   Lab Results  Component Value Date   CHOL 191 04/15/2022   Lab Results  Component Value Date   HDL 50 04/15/2022   Lab Results  Component Value Date   LDLCALC 108 (H) 04/15/2022   Lab Results  Component Value Date   TRIG 192 (H) 04/15/2022   Lab Results  Component Value Date   CHOLHDL 3.8 04/15/2022   Lab Results  Component Value Date   HGBA1C 6.1 (H) 04/15/2022      Assessment & Plan:   Problem List Items Addressed This Visit       Cardiovascular and Mediastinum   Essential hypertension    BP Readings from Last 1 Encounters:  09/17/22 138/74  Well-controlled with telmisartan 40 mg daily and amlodipine 10 mg daily now Counseled for compliance with the medications Advised DASH diet and moderate exercise/walking as tolerated      Relevant Orders   CBC with Differential/Platelet   CMP14+EGFR     Endocrine   Type 2 diabetes mellitus with hyperlipidemia (HCC) - Primary    Lab Results  Component Value Date   HGBA1C 6.1 (H) 04/15/2022  Well-controlled Was on Lantus 10 U qHS and on Metformin 1000 mg BID - discontinued due to hypoglycemia Discontinue Ozempic 1 mg qw due to hypoglycemia Needs CGM to monitor hypoglycemia Advised to follow diabetic diet On ARB and statin Diabetic eye exam: Advised to follow up with Ophthalmology for diabetic eye exam      Relevant Medications   Continuous Glucose Sensor (DEXCOM G7 SENSOR) MISC   Other Relevant Orders   Hemoglobin A1c   Hypoglycemia    Unclear about persistent hypoglycemia episodes-advised to check blood glucose with fingersticks as well DC Lantus, Ozempic and Metformin If consistent hypoglycemia noted, will check C-peptide and proinsulin Advised to maintain adequate hydration and eat at regular intervals         Musculoskeletal and Integument   Irritant contact dermatitis due to plants, except food    Uses Desonide cream, refilled      Relevant Medications   desonide (DESOWEN) 0.05 % cream     Genitourinary   Chronic cystitis    Pyridium as needed for chronic dysuria Prescribed Keflex for UTI -to be used for worsening of dysuria Had referred to urology - needs cystoscopy        Other   History of TIA (transient ischemic attack)    H/o TIA in 12/2019 On Aspirin and statin Has speech difficulty and chronic weakness, check MRI brain      Relevant Orders   MR Brain Wo Contrast   Hyperlipidemia   Relevant Orders   Lipid panel   Aphasia  Has sudden change in her voice quality compared to the last visit Unclear if related to a new CVA - check MRI brain Has worsening of fatigue and visual disturbance as well, progressive during the day, check AChE inhibitor antibody Referred to neurology Referred to speech therapy      Relevant Orders   MR Brain Wo Contrast   Ambulatory referral to Neurology   Ambulatory referral to Speech Therapy   Other Visit Diagnoses     Chronic fatigue       Relevant Orders   Acetylcholine receptor, blocking Abs       Meds ordered this encounter  Medications   Continuous Glucose Sensor (DEXCOM G7 SENSOR) MISC    Sig: Use it to check blood glucose 3 times before meals, at bedtime and as needed.    Dispense:  3 each    Refill:  5   desonide (DESOWEN) 0.05 % cream    Sig: Apply topically 2 (two) times daily.    Dispense:  60 g    Refill:  1    Follow-up: Return in about 3 months (around 12/18/2022) for DM and HTN.    Anabel Halon, MD

## 2022-09-17 NOTE — Assessment & Plan Note (Signed)
BP Readings from Last 1 Encounters:  09/17/22 138/74   Well-controlled with telmisartan 40 mg daily and amlodipine 10 mg daily now Counseled for compliance with the medications Advised DASH diet and moderate exercise/walking as tolerated

## 2022-09-18 ENCOUNTER — Other Ambulatory Visit: Payer: Self-pay | Admitting: Internal Medicine

## 2022-09-18 ENCOUNTER — Ambulatory Visit (HOSPITAL_COMMUNITY): Payer: 59

## 2022-09-18 DIAGNOSIS — L247 Irritant contact dermatitis due to plants, except food: Secondary | ICD-10-CM

## 2022-09-18 LAB — LIPID PANEL
Chol/HDL Ratio: 3.6 ratio (ref 0.0–4.4)
HDL: 52 mg/dL (ref >39)
LDL Chol Calc (NIH): 105 mg/dL — ABNORMAL HIGH (ref 0–99)

## 2022-09-18 LAB — CBC WITH DIFFERENTIAL/PLATELET
Basos: 2 %
EOS (ABSOLUTE): 0.1 10*3/uL (ref 0.0–0.4)
Eos: 3 %
Hematocrit: 29.9 % — ABNORMAL LOW (ref 34.0–46.6)
Lymphocytes Absolute: 1.7 10*3/uL (ref 0.7–3.1)
Lymphs: 34 %
Neutrophils Absolute: 2.6 10*3/uL (ref 1.4–7.0)
Platelets: 282 10*3/uL (ref 150–450)

## 2022-09-18 LAB — CMP14+EGFR
Albumin: 4.3 g/dL (ref 3.8–4.8)
BUN/Creatinine Ratio: 14 (ref 12–28)
CO2: 23 mmol/L (ref 20–29)
Creatinine, Ser: 0.81 mg/dL (ref 0.57–1.00)
eGFR: 75 mL/min/{1.73_m2} (ref >59)

## 2022-09-18 LAB — HEMOGLOBIN A1C: Est. average glucose Bld gHb Est-mCnc: 117 mg/dL

## 2022-09-18 MED ORDER — BETAMETHASONE DIPROPIONATE 0.05 % EX CREA
TOPICAL_CREAM | Freq: Two times a day (BID) | CUTANEOUS | 0 refills | Status: DC
Start: 2022-09-18 — End: 2023-04-08

## 2022-09-20 LAB — CMP14+EGFR
ALT: 18 IU/L (ref 0–32)
Albumin/Globulin Ratio: 1.7 (ref 1.2–2.2)
Bilirubin Total: 0.4 mg/dL (ref 0.0–1.2)
Calcium: 9.9 mg/dL (ref 8.7–10.3)
Chloride: 103 mmol/L (ref 96–106)
Globulin, Total: 2.5 g/dL (ref 1.5–4.5)
Glucose: 93 mg/dL (ref 70–99)
Sodium: 141 mmol/L (ref 134–144)

## 2022-09-20 LAB — CBC WITH DIFFERENTIAL/PLATELET
Immature Grans (Abs): 0 10*3/uL (ref 0.0–0.1)
Neutrophils: 51 %
RBC: 3.53 x10E6/uL — ABNORMAL LOW (ref 3.77–5.28)
WBC: 5.1 10*3/uL (ref 3.4–10.8)

## 2022-09-20 LAB — ACETYLCHOLINE RECEPTOR, BLOCKING

## 2022-09-20 LAB — HEMOGLOBIN A1C: Hgb A1c MFr Bld: 5.7 % — ABNORMAL HIGH (ref 4.8–5.6)

## 2022-09-24 ENCOUNTER — Other Ambulatory Visit: Payer: Self-pay | Admitting: Internal Medicine

## 2022-09-24 DIAGNOSIS — G7000 Myasthenia gravis without (acute) exacerbation: Secondary | ICD-10-CM | POA: Insufficient documentation

## 2022-09-24 LAB — CBC WITH DIFFERENTIAL/PLATELET
Basophils Absolute: 0.1 10*3/uL (ref 0.0–0.2)
Hemoglobin: 9.4 g/dL — ABNORMAL LOW (ref 11.1–15.9)
Immature Granulocytes: 0 %
MCH: 26.6 pg (ref 26.6–33.0)
MCHC: 31.4 g/dL — ABNORMAL LOW (ref 31.5–35.7)
MCV: 85 fL (ref 79–97)
Monocytes Absolute: 0.5 10*3/uL (ref 0.1–0.9)
Monocytes: 10 %
RDW: 16.5 % — ABNORMAL HIGH (ref 11.7–15.4)

## 2022-09-24 LAB — LIPID PANEL
Cholesterol, Total: 185 mg/dL (ref 100–199)
Triglycerides: 159 mg/dL — ABNORMAL HIGH (ref 0–149)
VLDL Cholesterol Cal: 28 mg/dL (ref 5–40)

## 2022-09-24 LAB — CMP14+EGFR
AST: 23 IU/L (ref 0–40)
Alkaline Phosphatase: 78 IU/L (ref 44–121)
BUN: 11 mg/dL (ref 8–27)
Potassium: 4 mmol/L (ref 3.5–5.2)
Total Protein: 6.8 g/dL (ref 6.0–8.5)

## 2022-10-07 ENCOUNTER — Ambulatory Visit (HOSPITAL_COMMUNITY): Payer: 59 | Attending: Internal Medicine | Admitting: Speech Pathology

## 2022-10-07 ENCOUNTER — Encounter (HOSPITAL_COMMUNITY): Payer: Self-pay | Admitting: Speech Pathology

## 2022-10-07 DIAGNOSIS — R41841 Cognitive communication deficit: Secondary | ICD-10-CM | POA: Diagnosis present

## 2022-10-07 DIAGNOSIS — R471 Dysarthria and anarthria: Secondary | ICD-10-CM | POA: Insufficient documentation

## 2022-10-07 DIAGNOSIS — R4701 Aphasia: Secondary | ICD-10-CM | POA: Insufficient documentation

## 2022-10-07 NOTE — Therapy (Signed)
OUTPATIENT SPEECH LANGUAGE PATHOLOGY EVALUATION   Patient Name: Jessica Mclean MRN: [id number] DOB:September 02, 1945, 77 y.o., female Today's Date: 10/07/2022  PCP: Anabel Halon, MD REFERRING PROVIDER: Anabel Halon, MD  END OF SESSION:  End of Session - 10/07/22 1700     Visit Number 1    Number of Visits 5    Date for SLP Re-Evaluation 11/13/22    Authorization Type [employer] Medicare    SLP Start Time 1520    SLP Stop Time  1620    SLP Time Calculation (min) 60 min    Activity Tolerance Patient tolerated treatment well             Past Medical History:  Diagnosis Date   Bleeding disorder (HCC)    Cervical cancer (HCC) 2010   Diabetes mellitus without complication (HCC)    type 2   Diabetes mellitus without complication (HCC)    Heart murmur    High cholesterol    HLD (hyperlipidemia)    Hypertension    Mental disorder    depression; suicidal ideation in 2017   Sleep apnea    Vitamin D deficiency    Past Surgical History:  Procedure Laterality Date   BILATERAL OOPHORECTOMY     CESAREAN SECTION     x 4   CHOLECYSTECTOMY     COLOSTOMY     x2   CYSTOSCOPY WITH URETHRAL DILATATION N/A 11/15/2020   Procedure: CYSTOSCOPY WITH URETHRAL DILATION;  Surgeon: Malen Gauze, MD;  Location: [facility];  Service: Urology;  Laterality: N/A;   SALPINGECTOMY     TONSILLECTOMY     TONSILLECTOMY AND ADENOIDECTOMY     Patient Active Problem List   Diagnosis Date Noted   Myasthenia gravis without acute exacerbation (HCC) 09/24/2022   Aphasia 09/17/2022   Irritant contact dermatitis due to plants, except food 09/17/2022   Encounter for general adult medical examination with abnormal findings 04/15/2022   Hypoglycemia 03/25/2022   Dizziness 12/02/2021   History of cervical cancer 12/02/2021   Arthralgia of shoulder region 06/18/2021   Chronic cough 06/18/2021   Colovaginal fistula 06/18/2021   Depression 06/18/2021   Hematuria 06/18/2021   History of asthma 06/18/2021    Hyperlipidemia 06/18/2021   Low back pain 06/18/2021   Migraine without aura and without status migrainosus, not intractable 06/18/2021   Obstructive sleep apnea 06/18/2021   Slow transit constipation 06/18/2021   Chronic cystitis 06/18/2021   Microcytic anemia 01/15/2021   Postprocedural female urethral stricture 11/27/2020   Idiopathic peripheral neuropathy 06/14/2020   Diabetic neuropathy (HCC) 06/14/2020   Gross hematuria 06/11/2020   Urge incontinence 03/08/2020   Essential hypertension 11/23/2019   Type 2 diabetes mellitus with hyperlipidemia (HCC) 11/23/2019   History of TIA (transient ischemic attack) 11/22/2019   Bulge of lumbar disc without myelopathy 11/13/2012   Cervical cancer (HCC) 04/13/2011    ONSET DATE: 09/17/2022   REFERRING DIAG: R47.01 (ICD-10-CM) - Aphasia  THERAPY DIAG:  Dysarthria and anarthria  Cognitive communication deficit  Rationale for Evaluation and Treatment: Rehabilitation  SUBJECTIVE:   SUBJECTIVE STATEMENT: "My speech is terrible and when I eat, I drool." Pt accompanied by: self  PERTINENT HISTORY: Jessica Mclean is a 77 yo female who was referred for a cognitive linguistic evaluation by Anabel Halon, MD, her PCP, due to Pt reports of difficulty with her speech, eating, and word finding.   PAIN:  Are you having pain? No  FALLS: Has patient fallen in last 6 months?  No  LIVING ENVIRONMENT: Lives with: lives alone Lives in: House/apartment  PLOF:  Level of assistance: Independent with ADLs, Independent with IADLs Employment: Retired  PATIENT GOALS: Improve speech and swallow  OBJECTIVE:   DIAGNOSTIC FINDINGS:   MRI 11/2019:  IMPRESSION: Punctate acute infarct versus artifact within the paramedian left pons as described.   No evidence of acute infarct elsewhere within the brain.   2 mm extra-axial focus of FLAIR hyperintensity and restricted diffusion overlying the left parietal lobe. This finding is nonspecific but  may reflect a tiny incidental meningioma.   Mild generalized parenchymal atrophy and chronic small vessel ischemic disease.   Minimal ethmoid sinus mucosal thickening.  COGNITION: Overall cognitive status: Impaired Areas of impairment:  Memory: Impaired: Short term Functional deficits: Pt is no longer driving, she is able to manage her appointments and medications at home, uses calendar and phone for assist  AUDITORY COMPREHENSION: Overall auditory comprehension: Appears intact YES/NO questions: Appears intact Following directions: Appears intact Conversation: Complex Interfering components: working Research scientist (life sciences): repetition/stressing words  READING COMPREHENSION: Intact  EXPRESSION: verbal  VERBAL EXPRESSION: Level of generative/spontaneous verbalization: conversation Automatic speech: name: intact, social response: intact, counting: intact, and month of year: intact  Repetition: Appears intact Naming: Responsive: 76-100%, Confrontation: 51-75%, and Divergent: 76-100% Pragmatics: Appears intact Comments: Pt named 17 animals in 60 seconds, [employer], short form: 10/15 and 15/15 with phonemic cues provided. Interfering components: speech intelligibility Effective technique: phonemic cues Non-verbal means of communication: N/A  WRITTEN EXPRESSION: Dominant hand: right Written expression: Not tested  MOTOR SPEECH: Overall motor speech: impaired Level of impairment: Sentence Respiration: diaphragmatic/abdominal breathing Phonation: normal Resonance:  becomes hypernasal with lengthy oral reading task Articulation: Impaired: sentence Intelligibility: Intelligibility reduced Motor planning: Appears intact Motor speech errors: aware and inconsistent Interfering components:  N/A Effective technique: slow rate, over articulate, and pause  ORAL MOTOR EXAMINATION: Overall status: Impaired:   Labial: Bilateral (Strength and Coordination) Lingual:  Bilateral (Strength and Coordination) Comments: Pt appeared experience fatigue with oral motor tasks with degradation of performance over time, difficulty with lingual elevation, and lingual protrusion noted with lengthy, oral reading task   CLINICAL SWALLOW ASSESSMENT:   Current diet: regular and thin liquids Dentition: dentures (top) Patient directly observed with POs: Yes: regular, dysphagia 1 (puree), and thin liquids  Feeding: able to feed self Liquids provided by: cup Oral phase signs and symptoms: prolonged mastication and prolonged bolus formation Pharyngeal phase signs and symptoms:  appears WFL Comments: Pt finely chops all her food for ease of mastication and bolus manipulation. She reports some drooling at times and she avoids eating in front of others.  STANDARDIZED ASSESSMENTS: SLUMS: 21/30  [facility] SLUMS Examination Orientation  3/3  Numeric Problem Solving  3/3  Memory  2/5  Attention 2/2  Thought Organization 3/3  Clock Drawing 2/4  Visuospatial Skills               2/2  Short Story Recall  4/8  Total  21/30     Scoring  High School Education  Less than High School Education   Normal  27-30 25-30  Mild Neurocognitive Disorder 21-26 20-24  Dementia  1-20 1-19     TODAY'S TREATMENT: Evaluation completed this date  DATE: 10/07/22    PATIENT EDUCATION: Education details: Recommend neurology referral, SLP therapy 1x/week for 4 weeks Person educated: Patient Education method: Explanation Education comprehension: verbalized understanding   GOALS: Goals reviewed with patient? Yes  SHORT TERM GOALS: Target date: 11/17/2022  Pt will implement word-finding strategies with 90% accuracy when unable to verbalize desired word in conversation/functional tasks with min assist.    Baseline: mi/mod cues in 10 minute conversation Goal  status: INITIAL  2.  Pt will describe objects and pictures by providing at least three salient features as judged by clinician with 90% acc when provided min cues.  Baseline: 80% Goal status: INITIAL  3.  Pt will utilize speech intelligibility strategies (over articulation, reduced speaking rate, and pacing) at the conversation level with 80% acc and min cues. Baseline: introduced this date, mod assist Goal status: INITIAL   LONG TERM GOALS:  Same as short term goals  ASSESSMENT:  CLINICAL IMPRESSION: Patient is a 77 y.o. female who was seen today for a cognitive linguistic evaluation after a referral from her PCP, Dr. Patterson Hammersmith. Pt presents with mild/mod cognitive deficits (attention and memory), mild dysnomia with word finding difficulties for more low frequency words, and moderate flaccid dysarthria characterized by imprecise articulation and increasing hypernasality with lengthier speech tasks. Pt exhibited vocal fatigue and muscle weakness with oral reading task and intelligibility significantly decreased (tongue progressively protruding). Oral motor examination revealed initially seemingly adequate strength, however with repetition of exercise, performance decreased significantly (lingual and labial). 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 Pt does not exhibit hoarseness and pitch variation adequate. She experiences oral dysphagia with solid foods and drooling. Pt has to finely chop all her foods in order to manipulate and swallow. She was evaluated with cup sips water, puree, and regular textures. She does not exhibit signs of pharyngeal dysphagia, but did exhibit impaired mastication, bolus manipulation, and prolonged oral transit with solids. In addition, Pt reports word finding deficits and often uses circumlocution strategies ("the stand for your camera" for tripod) to communicate. She scored a 10/15 on the [facility] Test short  form, but improved to 15/15 when phonemic cue provided. She scored a 21/30 on the [facility] with deficits in memory predominantly. Recommend neurology consult to evaluate symptoms of progressive weakness with speech and swallow tasks. While Pt does exhibit memory and word finding deficits, this appears to be separate from her significant dysarthria and oral dysphagia. Will plan to see Pt for SLP therapy 1x/week for 4 weeks with reassessment of needs (she will have MRI next week and hopefully see a neurologist soon) and focus on energy conservation with speech and swallow tasks, compensatory strategies, and word finding strategies. Pt lives alone and does not currently drive, but has good family support and a neighbor who helps drive her to appointments. Her daughter is one of the hospitalists at [facility].   OBJECTIVE IMPAIRMENTS: include memory, expressive language, dysarthria, and dysphagia. These impairments are limiting patient from effectively communicating at home and in community and safety when swallowing. Factors affecting potential to achieve goals and functional outcome are  unknown etiology of deficits . Patient will benefit from skilled SLP services to address above impairments and improve overall function.  REHAB POTENTIAL: Good  PLAN:  SLP FREQUENCY: 1x/week  SLP DURATION: 4 weeks  PLANNED INTERVENTIONS: Aspiration precaution training, Cueing hierachy, SLP instruction and feedback, Compensatory strategies, Patient/family education, and Re-evaluation   Thank you,  Havery Moros, CCC-SLP [phone]  Elmond Poehlman, CCC-SLP 10/07/2022, 5:03 PM

## 2022-10-14 ENCOUNTER — Encounter (HOSPITAL_COMMUNITY): Payer: Self-pay | Admitting: Radiology

## 2022-10-14 ENCOUNTER — Telehealth: Payer: Self-pay | Admitting: Internal Medicine

## 2022-10-14 ENCOUNTER — Ambulatory Visit (HOSPITAL_COMMUNITY)
Admission: RE | Admit: 2022-10-14 | Discharge: 2022-10-14 | Disposition: A | Discharge status: Home / Self Care | Payer: 59 | Source: Ambulatory Visit | Attending: Internal Medicine | Admitting: Internal Medicine

## 2022-10-14 DIAGNOSIS — R4701 Aphasia: Secondary | ICD-10-CM | POA: Diagnosis not present

## 2022-10-14 DIAGNOSIS — Z8673 Personal history of transient ischemic attack (TIA), and cerebral infarction without residual deficits: Secondary | ICD-10-CM | POA: Insufficient documentation

## 2022-10-14 DIAGNOSIS — I639 Cerebral infarction, unspecified: Secondary | ICD-10-CM | POA: Diagnosis not present

## 2022-10-14 NOTE — Telephone Encounter (Signed)
Spoke with patient.

## 2022-10-14 NOTE — Telephone Encounter (Signed)
Patient was taking the glucose monitor that she wore provider decided was better for patient and ordered it for her 6 months ago.  Pharmacy called the patient and sent patient the other one.   Please contact patient at [phone] does not have the new one.

## 2022-10-20 ENCOUNTER — Other Ambulatory Visit: Payer: Self-pay | Admitting: Internal Medicine

## 2022-10-20 DIAGNOSIS — G7000 Myasthenia gravis without (acute) exacerbation: Secondary | ICD-10-CM

## 2022-10-20 DIAGNOSIS — I10 Essential (primary) hypertension: Secondary | ICD-10-CM

## 2022-10-21 ENCOUNTER — Ambulatory Visit (HOSPITAL_COMMUNITY): Payer: 59 | Admitting: Speech Pathology

## 2022-10-28 ENCOUNTER — Encounter (HOSPITAL_COMMUNITY): Payer: Self-pay | Admitting: Speech Pathology

## 2022-10-28 ENCOUNTER — Ambulatory Visit (HOSPITAL_COMMUNITY): Payer: 59 | Attending: Internal Medicine | Admitting: Speech Pathology

## 2022-10-28 DIAGNOSIS — R41841 Cognitive communication deficit: Secondary | ICD-10-CM | POA: Insufficient documentation

## 2022-10-28 DIAGNOSIS — R471 Dysarthria and anarthria: Secondary | ICD-10-CM | POA: Diagnosis not present

## 2022-10-28 NOTE — Therapy (Signed)
OUTPATIENT SPEECH LANGUAGE PATHOLOGY TREATMENT NOTE   Patient Name: Jessica Mclean MRN: [id number] DOB:01-10-1946, 77 y.o., female Today's Date: 10/28/2022   PCP: Anabel Halon, MD REFERRING PROVIDER: Anabel Halon, MD   END OF SESSION:   End of Session - 10/28/22 1608     Visit Number 2    Number of Visits 5    Date for SLP Re-Evaluation 11/13/22    Authorization Type [employer] Medicare    SLP Start Time 1515    SLP Stop Time  1600    SLP Time Calculation (min) 45 min    Activity Tolerance Patient tolerated treatment well             Past Medical History:  Diagnosis Date   Bleeding disorder (HCC)    Cervical cancer (HCC) 2010   Diabetes mellitus without complication (HCC)    type 2   Diabetes mellitus without complication (HCC)    Heart murmur    High cholesterol    HLD (hyperlipidemia)    Hypertension    Mental disorder    depression; suicidal ideation in 2017   Sleep apnea    Vitamin D deficiency    Past Surgical History:  Procedure Laterality Date   BILATERAL OOPHORECTOMY     CESAREAN SECTION     x 4   CHOLECYSTECTOMY     COLOSTOMY     x2   CYSTOSCOPY WITH URETHRAL DILATATION N/A 11/15/2020   Procedure: CYSTOSCOPY WITH URETHRAL DILATION;  Surgeon: Malen Gauze, MD;  Location: [facility];  Service: Urology;  Laterality: N/A;   SALPINGECTOMY     TONSILLECTOMY     TONSILLECTOMY AND ADENOIDECTOMY     Patient Active Problem List   Diagnosis Date Noted   Myasthenia gravis without acute exacerbation (HCC) 09/24/2022   Aphasia 09/17/2022   Irritant contact dermatitis due to plants, except food 09/17/2022   Encounter for general adult medical examination with abnormal findings 04/15/2022   Hypoglycemia 03/25/2022   Dizziness 12/02/2021   History of cervical cancer 12/02/2021   Arthralgia of shoulder region 06/18/2021   Chronic cough 06/18/2021   Colovaginal fistula 06/18/2021   Depression 06/18/2021   Hematuria 06/18/2021   History of asthma  06/18/2021   Hyperlipidemia 06/18/2021   Low back pain 06/18/2021   Migraine without aura and without status migrainosus, not intractable 06/18/2021   Obstructive sleep apnea 06/18/2021   Slow transit constipation 06/18/2021   Chronic cystitis 06/18/2021   Microcytic anemia 01/15/2021   Postprocedural female urethral stricture 11/27/2020   Idiopathic peripheral neuropathy 06/14/2020   Diabetic neuropathy (HCC) 06/14/2020   Gross hematuria 06/11/2020   Urge incontinence 03/08/2020   Essential hypertension 11/23/2019   Type 2 diabetes mellitus with hyperlipidemia (HCC) 11/23/2019   History of TIA (transient ischemic attack) 11/22/2019   Bulge of lumbar disc without myelopathy 11/13/2012   Cervical cancer (HCC) 04/13/2011    ONSET DATE: 09/17/2022    REFERRING DIAG: R47.01 (ICD-10-CM) - Aphasia   THERAPY DIAG:  Dysarthria and anarthria   Cognitive communication deficit   Rationale for Evaluation and Treatment: Rehabilitation  PERTINENT HISTORY: Jessica Mclean is a 77 yo female who was referred for a cognitive linguistic evaluation by Anabel Halon, MD, her PCP, due to Pt reports of difficulty with her speech, eating, and word finding.  PATIENT EDUCATION: Education details: Recommend neurology referral, SLP therapy 1x/week for 4 weeks Person educated: Patient Education method: Explanation Education comprehension: verbalized understanding  SUBJECTIVE:   SUBJECTIVE STATEMENT: "Food still falls  out of my mouth."  PAIN:  Are you having pain? No   OBJECTIVE:   GOALS: Goals reviewed with patient? Yes   SHORT TERM GOALS: Target date: 11/17/2022   Pt will implement word-finding strategies with 90% accuracy when unable to verbalize desired word in conversation/functional tasks with min assist.    Baseline: mi/mod cues in 10 minute conversation Goal status: ONGOING   2.  Pt will describe objects and pictures by providing at least three salient features as judged by  clinician with 90% acc when provided min cues.  Baseline: 80% Goal status: ONGOING   3.  Pt will utilize speech intelligibility strategies (over articulation, reduced speaking rate, and pacing) at the conversation level with 80% acc and min cues. Baseline: introduced this date, mod assist Goal status: ONGOING     LONG TERM GOALS:  Same as short term goals   ASSESSMENT:   CLINICAL IMPRESSION: (from initial evaluation on 10/07/2022) Patient is a 77 y.o. female who was seen today for a cognitive linguistic evaluation after a referral from her PCP, Dr. Patterson Hammersmith. Pt presents with mild/mod cognitive deficits (attention and memory), mild dysnomia with word finding difficulties for more low frequency words, and moderate flaccid dysarthria characterized by imprecise articulation and increasing hypernasality with lengthier speech tasks. Pt exhibited vocal fatigue and muscle weakness with oral reading task and intelligibility significantly decreased (tongue progressively protruding). Oral motor examination revealed initially seemingly adequate strength, however with repetition of exercise, performance decreased significantly (lingual and labial). 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 Pt does not exhibit hoarseness and pitch variation adequate. She experiences oral dysphagia with solid foods and drooling. Pt has to finely chop all her foods in order to manipulate and swallow. She was evaluated with cup sips water, puree, and regular textures. She does not exhibit signs of pharyngeal dysphagia, but did exhibit impaired mastication, bolus manipulation, and prolonged oral transit with solids. In addition, Pt reports word finding deficits and often uses circumlocution strategies ("the stand for your camera" for tripod) to communicate. She scored a 10/15 on the [facility] Test short form, but improved to 15/15 when phonemic cue provided. She scored a 21/30 on  the [facility] with deficits in memory predominantly. Recommend neurology consult to evaluate symptoms of progressive weakness with speech and swallow tasks. While Pt does exhibit memory and word finding deficits, this appears to be separate from her significant dysarthria and oral dysphagia. Will plan to see Pt for SLP therapy 1x/week for 4 weeks with reassessment of needs (she will have MRI next week and hopefully see a neurologist soon) and focus on energy conservation with speech and swallow tasks, compensatory strategies, and word finding strategies. Pt lives alone and does not currently drive, but has good family support and a neighbor who helps drive her to appointments. Her daughter is one of the hospitalists at [facility].    OBJECTIVE IMPAIRMENTS: include memory, expressive language, dysarthria, and dysphagia. These impairments are limiting patient from effectively communicating at home and in community and safety when swallowing. Factors affecting potential to achieve goals and functional outcome are  unknown etiology of deficits . Patient will benefit from skilled SLP services to address above impairments and improve overall function.   REHAB POTENTIAL: Good   PLAN:   SLP FREQUENCY: 1x/week   SLP DURATION: 3 weeks   PLANNED INTERVENTIONS: Aspiration precaution training, Cueing hierachy, SLP instruction and feedback, Compensatory strategies, Patient/family education,  and Re-evaluation  TODAY'S TREATMENT:    Pt unaccompanied to therapy today. She had her MRI which was negative for acute changes. She has not yet seen the neurologist, but is scheduled for [facility] on July 2. [facility] has called her a few times to work her in, but Pt missed the phone calls. Pt showed SLP her cell phone and stated that she didn't know how to access her voicemails. She had 68 new voice mails dating back from 2021. SLP showed her step by step how to access the voice mails from the home screen  and Pt was able to return demonstrated with mi/mod cues. SLP also left a voicemail for the neurologist from her phone and they will call her back when they find an appointment for her. Pt stated that she does not want to come to therapy if there is a co-pay (it appears she does not have one). SLP reinforced that her greatest need at this time, if she needs to prioritize appointments, is to see a neurologist in case medication would help her. She continues to present with mixed dysarthria which becomes more unintelligible with lengthier conversation. She was reminded to chop her meats finely and eat slowly. Plan to see Pt the week of June 24 and continue plan of care.                                                                                                                                    DATE: 10/28/22   Thank you,  Havery Moros, CCC-SLP [phone]   Pascual Mantel, CCC-SLP 10/28/2022, 5:12 PM

## 2022-10-29 ENCOUNTER — Encounter: Payer: Self-pay | Admitting: Neurology

## 2022-10-30 NOTE — Progress Notes (Signed)
Initial neurology clinic note  Reason for Evaluation: Consultation requested by Anabel Halon, MD for an opinion regarding myasthenia gravis. My final recommendations will be communicated back to the requesting physician by way of shared medical record or letter to requesting physician via [place] mail.  HPI: This is Ms. Kali Arita, a 77 y.o. right-handed female with a medical history of HTN, IDDM, HLD, cervical cancer s/p radiation, TIA, depression, vit D deficiency, sleep apnea who presents to neurology clinic with the chief complaint of dysarthria, dysphagia, ptosis, and falls. The patient is alone today.  Patient woke up over a year ago and had difficulty lifting her head and had vertigo. She called her daughter who is a doctor, who insisted patient go to the hospital. She also had dysarthria. This was first thing in the morning. She had the COVID vaccine the day before, so symptoms were attributed to that. The vertigo resolved. She continues to have falls. She is not sure why. She had an MRI finally in 09/2022 (about 1 month ago) that did not show evidence of a stroke. Over the last year, she has noticed symptoms are worse with fatigue, end of day, and with use. PCP checked AChR blocking ab and was elevated to 52.   Patient has stopped taking all of her medications due to concerns of what would make myasthenia worse.  Current MG symptoms: Ptosis: Yes, especially while watching TV Double vision: Yes Speech: Yes, worse when talking more and at end of day Chewing: Yes Swallowing: Yes, choking on solids. Liquids tend to dribble out of mouth. Breathing: Denies orthopnea Arm strength: No issues Leg strength: Does not think she is weak, but does endorse falls.  She is not on any medication for myasthenia gravis.  Of note patient takes gabapentin 100 mg qhs for diabetic neuropathy. This helps.  Patient is currently going to speech therapy. She is not sure she will continue due to costly  co-pay.  She does not report any constitutional symptoms like fever, night sweats, anorexia or unintentional weight loss.  EtOH use: None  Restrictive diet? Vegan, not on B12. She has been eating much less lately, mostly bread and tomatoes Family history of neuropathy/myopathy/neurologic disease? No   MEDICATIONS:  Outpatient Encounter Medications as of 11/06/2022  Medication Sig   albuterol (VENTOLIN HFA) 108 (90 Base) MCG/ACT inhaler Inhale 2 puffs into the lungs every 6 (six) hours as needed for wheezing or shortness of breath.   amLODipine (NORVASC) 10 MG tablet TAKE 1 TABLET BY MOUTH DAILY   aspirin-acetaminophen-caffeine (EXCEDRIN EXTRA STRENGTH) 250-250-65 MG tablet Take 2 tablets by mouth in the morning.   atorvastatin (LIPITOR) 80 MG tablet TAKE 1 TABLET BY MOUTH DAILY   betamethasone dipropionate 0.05 % cream Apply topically 2 (two) times daily.   blood glucose meter kit and supplies Dispense based on patient and insurance preference. Use up to four times daily as directed. (FOR ICD-10 E10.9, E11.9).   Blood Glucose Monitoring Suppl (ACCU-CHEK GUIDE) w/Device KIT    calcium-vitamin D (OSCAL-500) 500-400 MG-UNIT tablet Take 1 tablet by mouth daily.   cephALEXin (KEFLEX) 500 MG capsule Take 1 capsule (500 mg total) by mouth 3 (three) times daily.   Continuous Blood Gluc Receiver (DEXCOM G7 RECEIVER) DEVI Use it to check blood glucose 3 times before meals, at bedtime and as needed.   Continuous Glucose Sensor (DEXCOM G7 SENSOR) MISC Use it to check blood glucose 3 times before meals, at bedtime and as needed.   desonide (  DESOWEN) 0.05 % cream Apply topically 2 (two) times daily.   gabapentin (NEURONTIN) 100 MG capsule Take 1 capsule (100 mg total) by mouth at bedtime.   Glucose Blood (BLOOD GLUCOSE TEST STRIPS) STRP Use to test once daily dx E11.9   Lancets MISC Use to test once daily DX: E 11.9   meclizine (ANTIVERT) 25 MG tablet Take 1 tablet (25 mg total) by mouth 3 (three) times  daily as needed for dizziness.   Multiple Vitamins-Minerals (PRESERVISION AREDS PO) Take 2 capsules by mouth daily.   omega-3 acid ethyl esters (LOVAZA) 1 g capsule Take 2 g by mouth 2 (two) times daily.   phenazopyridine (PYRIDIUM) 100 MG tablet Take 1 tablet (100 mg total) by mouth 3 (three) times daily as needed for pain (Dysuria).   polyethylene glycol powder (GLYCOLAX/MIRALAX) 17 GM/SCOOP powder Take 17 g by mouth daily as needed for mild constipation or moderate constipation.    predniSONE (DELTASONE) 10 MG tablet Take 2 tablets (20 mg total) by mouth daily with breakfast. Take 1 tablets (10 mg) daily for 1 week, then increase to 2 tablets (20 mg) daily thereafter   pyridostigmine (MESTINON) 60 MG tablet Take 1 tablet (60 mg total) by mouth 3 (three) times daily.   tamsulosin (FLOMAX) 0.4 MG CAPS capsule Take 1 capsule (0.4 mg total) by mouth daily after supper.   telmisartan (MICARDIS) 40 MG tablet TAKE 1 TABLET BY MOUTH DAILY   No facility-administered encounter medications on file as of 11/06/2022.    PAST MEDICAL HISTORY: Past Medical History:  Diagnosis Date   Bleeding disorder (HCC)    Cervical cancer (HCC) 2010   Diabetes mellitus without complication (HCC)    type 2   Diabetes mellitus without complication (HCC)    Heart murmur    High cholesterol    HLD (hyperlipidemia)    Hypertension    Mental disorder    depression; suicidal ideation in 2017   Sleep apnea    Vitamin D deficiency     PAST SURGICAL HISTORY: Past Surgical History:  Procedure Laterality Date   BILATERAL OOPHORECTOMY     CESAREAN SECTION     x 4   CHOLECYSTECTOMY     COLOSTOMY     x2   CYSTOSCOPY WITH URETHRAL DILATATION N/A 11/15/2020   Procedure: CYSTOSCOPY WITH URETHRAL DILATION;  Surgeon: Malen Gauze, MD;  Location: [facility];  Service: Urology;  Laterality: N/A;   SALPINGECTOMY     TONSILLECTOMY     TONSILLECTOMY AND ADENOIDECTOMY      ALLERGIES: Allergies  Allergen Reactions    Other     Nickel staples   Bactrim [Sulfamethoxazole-Trimethoprim]     Hypoglycemia   Clindamycin Phosphate Nausea And Vomiting    FAMILY HISTORY: Family History  Problem Relation Age of Onset   Cancer Father        kidney   Cancer Mother        lung   Other Brother        heart issues    SOCIAL HISTORY: Social History   Tobacco Use   Smoking status: Never   Smokeless tobacco: Never  Vaping Use   Vaping Use: Never used  Substance Use Topics   Alcohol use: Never   Drug use: Never   Social History   Social History Narrative   ** Merged History Encounter **    Right handed   Leave alone-1 level home   Caffeine--soda/coffee      OBJECTIVE: PHYSICAL EXAM: BP Marland Kitchen)  169/92   Pulse 89   Ht 5' 4.5" (1.638 m)   Wt 128 lb (58.1 kg)   LMP  (LMP Unknown)   SpO2 98%   BMI 21.63 kg/m   General: General appearance: Awake and alert. No distress. Cooperative with exam.  Skin: No obvious rash or jaundice. HEENT: Atraumatic. Anicteric. Lungs: Non-labored breathing on room air  Heart: Regular Extremities: No edema. Psych: Affect appropriate.  Neurological: Mental Status: Alert. Speech fluent. No pseudobulbar affect Cranial Nerves: CNII: No RAPD. Visual fields grossly intact. CNIII, IV, VI: PERRL. No nystagmus. EOMI. CN V: Facial sensation intact bilaterally to fine touch. CN VII: Bilateral ptosis, worse with sustained up gaze. Weakness of bilateral orbicularis oculi and orbicularis oris. CN VIII: Hearing grossly intact bilaterally. CN IX: No hypophonia. CN X: Palate elevates symmetrically. CN XI: Full strength shoulder shrug bilaterally. CN XII: Tongue protrusion full and midline. No atrophy or fasciculations. Moderate dysarthria - worse as she talks more. Motor: Tone is normal.  Individual muscle group testing (MRC grade out of 5):  Movement     Neck flexion 4+    Neck extension 5     Right Left   Shoulder abduction 4+ 4+   Shoulder adduction 5 5    Shoulder ext rotation 5 5   Shoulder int rotation 5 5   Elbow flexion 5 5   Elbow extension 5 5   Finger abduction - FDI 5 5   Finger abduction - ADM 5 5   Finger extension 5 5   Finger distal flexion - 2/3 5 5    Finger distal flexion - 4/5 5 5    Thumb flexion - FPL 5 5   Thumb abduction - APB 5 5    Hip flexion 4 4   Hip extension 5 5   Hip adduction 5 5   Hip abduction 5 5   Knee extension 5 5   Knee flexion 5 5   Dorsiflexion 5 5   Plantarflexion 5 5     Reflexes:  Right Left   Bicep 2+ 2+   Tricep 2+ 2+   BrRad 2+ 2+   Knee 2+ 2+   Ankle 0 0    Sensation: Pinprick: Diminished in bilateral lower extremities distal to proximal gradient Coordination: Intact finger-to- nose-finger bilaterally. Romberg with mild sway. Gait: Able to rise from chair with arms crossed unassisted. Normal, narrow-based gait.  Lab and Test Review: Internal labs: 09/17/22: AChR blocking abs: elevated to 52 HbA1c: 5.7 (high of 9.2 on 01/09/21) CMP unremarkable CBC w/ diff significant for Hb 9.4 (chronic) Lipid panel: Component     Latest Ref Rng 09/17/2022  Cholesterol, Total     100 - 199 mg/dL 161   Triglycerides     0 - 149 mg/dL 096 (H)   HDL Cholesterol     >39 mg/dL 52   VLDL Cholesterol Cal     5 - 40 mg/dL 28   LDL Chol Calc (NIH)     0 - 99 mg/dL 045 (H)   Total CHOL/HDL Ratio     0.0 - 4.4 ratio 3.6     TSH (04/15/22): 1.220 Vit D (04/15/22): 32.2  Imaging: MRI brain wo contrast (10/14/22): FINDINGS: Brain: Diffusion imaging does not show any acute or subacute infarction. No abnormality affects the brainstem or cerebellum. Incidental perivascular spaces in the right mid brain. Cerebral hemispheres show mild chronic small-vessel ischemic change of the deep and subcortical white matter. No cortical or large vessel territory stroke.  No mass lesion, hemorrhage, hydrocephalus or extra-axial collection.   Vascular: Major vessels at the base of the brain show flow.    Skull and upper cervical spine: Negative   Sinuses/Orbits: Clear/normal   Other: None   IMPRESSION: No acute finding. Mild chronic small-vessel ischemic change of the cerebral hemispheric white matter.  ASSESSMENT: Krystin Rudden is a 77 y.o. female who presents for evaluation of dysarthria, dysphagia, ptosis, diplopia, and proximal muscle weakness in the setting of positive AChR antibodies. She has a relevant medical history of HTN, IDDM, HLD, cervical cancer s/p radiation, TIA, depression, vit D deficiency, sleep apnea. Her neurological examination is pertinent for ptosis worse with sustained up gaze, facial muscle weakness, and weakness of proximal limbs. Available diagnostic data is significant for AChR blocking ab positive at 52. MRI brain showed no evidence of stroke. Patient's symptoms are consistent with AChR ab positive, generalized myasthenia gravis. She is currently not on treatment. Her symptoms are currently severe, so I will be more aggressive with treatment. She is diabetic though, so I prefer to keep prednisone to a minimum. I will get IVIg for 5 days to get control of symptoms. I may consider monthly dosing if needed.  PLAN: -Blood work: B1, B12, TSH, AChR modulating and binding abs, TPMT -CT chest w/wo contrast to evaluate for thymoma -Start mestinon 60 mg TID -Start prednisone 10 mg daily for 1 week, then increase to 20 mg daily -IVIg 0.4 mg/kg/day for 5 days - will attempt to get home infusion due to patient needing transportation and living further away (will send to [facility])  -Return to clinic in 1 month  The impression above as well as the plan as outlined below were extensively discussed with the patient who voiced understanding. All questions were answered to their satisfaction.  The patient was counseled on pertinent fall precautions per the printed material provided today, and as noted under the "Patient Instructions" section below.  When available, results of  the above investigations and possible further recommendations will be communicated to the patient via telephone/MyChart. Patient to call office if not contacted after expected testing turnaround time.   Total time spent reviewing records, interview, history/exam, documentation, and coordination of care on day of encounter:  65 min   Thank you for allowing me to participate in patient's care.  If I can answer any additional questions, I would be pleased to do so.  Jacquelyne Balint, MD   CC: Anabel Halon, MD [address] [place] [address]  CC: Referring provider: Anabel Halon, MD [address] [place],  [place] [address]

## 2022-11-06 ENCOUNTER — Ambulatory Visit (INDEPENDENT_AMBULATORY_CARE_PROVIDER_SITE_OTHER): Payer: 59 | Admitting: Neurology

## 2022-11-06 ENCOUNTER — Encounter: Payer: Self-pay | Admitting: Neurology

## 2022-11-06 VITALS — BP 169/92 | HR 89 | Ht 64.5 in | Wt 128.0 lb

## 2022-11-06 DIAGNOSIS — H532 Diplopia: Secondary | ICD-10-CM | POA: Diagnosis not present

## 2022-11-06 DIAGNOSIS — M6281 Muscle weakness (generalized): Secondary | ICD-10-CM

## 2022-11-06 DIAGNOSIS — H02403 Unspecified ptosis of bilateral eyelids: Secondary | ICD-10-CM | POA: Diagnosis not present

## 2022-11-06 DIAGNOSIS — G7000 Myasthenia gravis without (acute) exacerbation: Secondary | ICD-10-CM | POA: Diagnosis not present

## 2022-11-06 DIAGNOSIS — R471 Dysarthria and anarthria: Secondary | ICD-10-CM

## 2022-11-06 DIAGNOSIS — R1310 Dysphagia, unspecified: Secondary | ICD-10-CM

## 2022-11-06 MED ORDER — PYRIDOSTIGMINE BROMIDE 60 MG PO TABS
60.0000 mg | ORAL_TABLET | Freq: Three times a day (TID) | ORAL | 5 refills | Status: DC
Start: 2022-11-06 — End: 2023-04-10

## 2022-11-06 MED ORDER — PREDNISONE 10 MG PO TABS
20.0000 mg | ORAL_TABLET | Freq: Every day | ORAL | 5 refills | Status: DC
Start: 2022-11-06 — End: 2022-12-26

## 2022-11-06 NOTE — Addendum Note (Signed)
Addended by: Shelly Bombard on: 11/06/2022 01:52 PM   Modules accepted: Orders

## 2022-11-06 NOTE — Patient Instructions (Addendum)
I saw you today for a muscle weakness disease called myasthenia gravis. You have the antibodies, so the diagnosis is certain.  I need to investigate further with blood work today.  I am ordering a CT of your chest as myasthenia is sometimes associated with a cancer called a thymoma. We need to evaluate for this.  Your symptoms are significant, so I would like to get an infusion called IVIg to help your symptoms more quickly. This will be done at an infusion center for 5 straight days. Expect a call from them.  I am starting 2 medications for myasthenia: -Start mestinon 60 mg TID -Start prednisone 10 mg daily for 1 week, then increase to 20 mg daily  I need to see you back in clinic in 1 month.  If you have any worsening or questions or concerns, please call.  If you have worsening swallowing, worsening breathing, or severe weakness, this could be an emergency for which I recommend going to the nearest ED.  The physicians and staff at [facility] are committed to providing excellent care. You may receive a survey requesting feedback about your experience at our office. We strive to receive "very good" responses to the survey questions. If you feel that your experience would prevent you from giving the office a "very good " response, please contact our office to try to remedy the situation. We may be reached at [phone]. Thank you for taking the time out of your busy day to complete the survey.  Jacquelyne Balint, MD [facility]  Preventing Falls at [facility] are common, often dreaded events in the lives of older people. Aside from the obvious injuries and even death that may result, fall can cause wide-ranging consequences including loss of independence, mental decline, decreased activity and mobility. Younger people are also at risk of falling, especially those with chronic illnesses and fatigue.  Ways to reduce risk for falling Examine diet and medications. Warm foods and  alcohol dilate blood vessels, which can lead to dizziness when standing. Sleep aids, antidepressants and pain medications can also increase the likelihood of a fall.  Get a vision exam. Poor vision, cataracts and glaucoma increase the chances of falling.  Check foot gear. Shoes should fit snugly and have a sturdy, nonskid sole and a broad, low heel  Participate in a physician-approved exercise program to build and maintain muscle strength and improve balance and coordination. Programs that use ankle weights or stretch bands are excellent for muscle-strengthening. Water aerobics programs and low-impact Tai Chi programs have also been shown to improve balance and coordination.  Increase vitamin D intake. Vitamin D improves muscle strength and increases the amount of calcium the body is able to absorb and deposit in bones.  How to prevent falls from common hazards Floors - Remove all loose wires, cords, and throw rugs. Minimize clutter. Make sure rugs are anchored and smooth. Keep furniture in its usual place.  Chairs -- Use chairs with straight backs, armrests and firm seats. Add firm cushions to existing pieces to add height.  Bathroom - Install grab bars and non-skid tape in the tub or shower. Use a bathtub transfer bench or a shower chair with a back support Use an elevated toilet seat and/or safety rails to assist standing from a low surface. Do not use towel racks or bathroom tissue holders to help you stand.  Lighting - Make sure halls, stairways, and entrances are well-lit. Install a night light in your bathroom or hallway. Make  sure there is a light switch at the top and bottom of the staircase. Turn lights on if you get up in the middle of the night. Make sure lamps or light switches are within reach of the bed if you have to get up during the night.  Kitchen - Install non-skid rubber mats near the sink and stove. Clean spills immediately. Store frequently used utensils, pots, pans between  waist and eye level. This helps prevent reaching and bending. Sit when getting things out of lower cupboards.  Living room/ Bedrooms - Place furniture with wide spaces in between, giving enough room to move around. Establish a route through the living room that gives you something to hold onto as you walk.  Stairs - Make sure treads, rails, and rugs are secure. Install a rail on both sides of the stairs. If stairs are a threat, it might be helpful to arrange most of your activities on the lower level to reduce the number of times you must climb the stairs.  Entrances and doorways - Install metal handles on the walls adjacent to the doorknobs of all doors to make it more secure as you travel through the doorway.  Tips for maintaining balance Keep at least one hand free at all times. Try using a backpack or fanny pack to hold things rather than carrying them in your hands. Never carry objects in both hands when walking as this interferes with keeping your balance.  Attempt to swing both arms from front to back while walking. This might require a conscious effort if Parkinson's disease has diminished your movement. It will, however, help you to maintain balance and posture, and reduce fatigue.  Consciously lift your feet off of the ground when walking. Shuffling and dragging of the feet is a common culprit in losing your balance.  When trying to navigate turns, use a "U" technique of facing forward and making a wide turn, rather than pivoting sharply.  Try to stand with your feet shoulder-length apart. When your feet are close together for any length of time, you increase your risk of losing your balance and falling.  Do one thing at a time. Don't try to walk and accomplish another task, such as reading or looking around. The decrease in your automatic reflexes complicates motor function, so the less distraction, the better.  Do not wear rubber or gripping soled shoes, they might "catch" on the  floor and cause tripping.  Move slowly when changing positions. Use deliberate, concentrated movements and, if needed, use a grab bar or walking aid. Count 15 seconds between each movement. For example, when rising from a seated position, wait 15 seconds after standing to begin walking.  If balance is a continuous problem, you might want to consider a walking aid such as a cane, walking stick, or walker. Once you've mastered walking with help, you might be ready to try it on your own again.

## 2022-11-06 NOTE — Progress Notes (Signed)
I saw Jessica Mclean in neurology clinic on 11/19/22 in follow up for AChR positive, generalized myasthenia gravis.  HPI: Jessica Mclean is a 77 y.o. year old right-handed female with a medical history of HTN, IDDM, HLD, cervical cancer s/p radiation, TIA, depression, vit D deficiency, sleep apnea who we last saw on 11/06/22.  To briefly review: Patient woke up over a year ago and had difficulty lifting her head and had vertigo. She called her daughter who is a doctor, who insisted patient go to the hospital. She also had dysarthria. This was first thing in the morning. She had the COVID vaccine the day before, so symptoms were attributed to that. The vertigo resolved. She continues to have falls. She is not sure why. She had an MRI finally in 09/2022 (about 1 month ago) that did not show evidence of a stroke. Over the last year, she has noticed symptoms are worse with fatigue, end of day, and with use. PCP checked AChR blocking ab and was elevated to 52.    Patient has stopped taking all of her medications due to concerns of what would make myasthenia worse.   Current MG symptoms: Ptosis: Yes, especially while watching TV Double vision: Yes Speech: Yes, worse when talking more and at end of day Chewing: Yes Swallowing: Yes, choking on solids. Liquids tend to dribble out of mouth. Breathing: Denies orthopnea Arm strength: No issues Leg strength: Does not think she is weak, but does endorse falls.   She is not on any medication for myasthenia gravis.   Of note patient takes gabapentin 100 mg qhs for diabetic neuropathy. This helps.   Patient is currently going to speech therapy. She is not sure she will continue due to costly co-pay.   She does not report any constitutional symptoms like fever, night sweats, anorexia or unintentional weight loss.   EtOH use: None  Restrictive diet? Vegan, not on B12. She has been eating much less lately, mostly bread and tomatoes Family history of  neuropathy/myopathy/neurologic disease? No  Most recent Assessment and Plan (11/06/22): Jessica Mclean is a 77 y.o. female who presents for evaluation of dysarthria, dysphagia, ptosis, diplopia, and proximal muscle weakness in the setting of positive AChR antibodies. She has a relevant medical history of HTN, IDDM, HLD, cervical cancer s/p radiation, TIA, depression, vit D deficiency, sleep apnea. Her neurological examination is pertinent for ptosis worse with sustained up gaze, facial muscle weakness, and weakness of proximal limbs. Available diagnostic data is significant for AChR blocking ab positive at 52. MRI brain showed no evidence of stroke. Patient's symptoms are consistent with AChR ab positive, generalized myasthenia gravis. She is currently not on treatment. Her symptoms are currently severe, so I will be more aggressive with treatment. She is diabetic though, so I prefer to keep prednisone to a minimum. I will get IVIg for 5 days to get control of symptoms. I may consider monthly dosing if needed.   PLAN: -Blood work: B1, B12, TSH, AChR modulating and binding abs, TPMT -CT chest w/wo contrast to evaluate for thymoma -Start mestinon 60 mg TID -Start prednisone 10 mg daily for 1 week, then increase to 20 mg daily -IVIg 0.4 g/kg/day for 5 days - will attempt to get home infusion due to patient needing transportation and living further away (will send to [facility])  Since their last visit: Patient has not gotten her blood work or CT chest. Patient has not been called to get CT chest per her report, but it  appears [facility] has been trying to reach patient (per notes).  She got IVIg at home from 11/14/22-11/18/22. She had significant fatigue during the treatment.  Current MG symptoms: Ptosis: About the same as prior Double vision: About the same as prior Speech: Has been clearer Chewing: Better than prior Swallowing: A little improvement Breathing: Did get woken up with shortness  of breath Arm strength: Feels weak, which she did not mention at last visit. Fatigues easily. Leg strength: Fatigues easily. Fell this morning.  Current medications:  -Prednisone 20 mg daily -Mestinon 60 mg TID S/p IVIg 0.4 g/kg/day for 5 days (11/14/22-11/18/22)  Side effects: None   MEDICATIONS:  Outpatient Encounter Medications as of 11/19/2022  Medication Sig   albuterol (VENTOLIN HFA) 108 (90 Base) MCG/ACT inhaler Inhale 2 puffs into the lungs every 6 (six) hours as needed for wheezing or shortness of breath.   amLODipine (NORVASC) 10 MG tablet TAKE 1 TABLET BY MOUTH DAILY   aspirin-acetaminophen-caffeine (EXCEDRIN EXTRA STRENGTH) 250-250-65 MG tablet Take 2 tablets by mouth in the morning.   atorvastatin (LIPITOR) 80 MG tablet TAKE 1 TABLET BY MOUTH DAILY   betamethasone dipropionate 0.05 % cream Apply topically 2 (two) times daily.   blood glucose meter kit and supplies Dispense based on patient and insurance preference. Use up to four times daily as directed. (FOR ICD-10 E10.9, E11.9).   Blood Glucose Monitoring Suppl (ACCU-CHEK GUIDE) w/Device KIT    calcium-vitamin D (OSCAL-500) 500-400 MG-UNIT tablet Take 1 tablet by mouth daily.   cephALEXin (KEFLEX) 500 MG capsule Take 1 capsule (500 mg total) by mouth 3 (three) times daily.   Continuous Blood Gluc Receiver (DEXCOM G7 RECEIVER) DEVI Use it to check blood glucose 3 times before meals, at bedtime and as needed.   Continuous Glucose Sensor (DEXCOM G7 SENSOR) MISC Use it to check blood glucose 3 times before meals, at bedtime and as needed.   desonide (DESOWEN) 0.05 % cream Apply topically 2 (two) times daily.   gabapentin (NEURONTIN) 100 MG capsule Take 1 capsule (100 mg total) by mouth at bedtime.   Glucose Blood (BLOOD GLUCOSE TEST STRIPS) STRP Use to test once daily dx E11.9   Lancets MISC Use to test once daily DX: E 11.9   meclizine (ANTIVERT) 25 MG tablet Take 1 tablet (25 mg total) by mouth 3 (three) times daily as needed  for dizziness.   Multiple Vitamins-Minerals (PRESERVISION AREDS PO) Take 2 capsules by mouth daily.   omega-3 acid ethyl esters (LOVAZA) 1 g capsule Take 2 g by mouth 2 (two) times daily.   phenazopyridine (PYRIDIUM) 100 MG tablet Take 1 tablet (100 mg total) by mouth 3 (three) times daily as needed for pain (Dysuria).   polyethylene glycol powder (GLYCOLAX/MIRALAX) 17 GM/SCOOP powder Take 17 g by mouth daily as needed for mild constipation or moderate constipation.    predniSONE (DELTASONE) 10 MG tablet Take 2 tablets (20 mg total) by mouth daily with breakfast. Take 1 tablets (10 mg) daily for 1 week, then increase to 2 tablets (20 mg) daily thereafter (Patient taking differently: Take 20 mg by mouth daily with breakfast. 2 tablets (20 mg) daily)   pyridostigmine (MESTINON) 60 MG tablet Take 1 tablet (60 mg total) by mouth 3 (three) times daily.   tamsulosin (FLOMAX) 0.4 MG CAPS capsule Take 1 capsule (0.4 mg total) by mouth daily after supper.   telmisartan (MICARDIS) 40 MG tablet TAKE 1 TABLET BY MOUTH DAILY   No facility-administered encounter medications on file  as of 11/19/2022.    PAST MEDICAL HISTORY: Past Medical History:  Diagnosis Date   Bleeding disorder (HCC)    Cervical cancer (HCC) 2010   Diabetes mellitus without complication (HCC)    type 2   Diabetes mellitus without complication (HCC)    Heart murmur    High cholesterol    HLD (hyperlipidemia)    Hypertension    Mental disorder    depression; suicidal ideation in 2017   Sleep apnea    Vitamin D deficiency     PAST SURGICAL HISTORY: Past Surgical History:  Procedure Laterality Date   BILATERAL OOPHORECTOMY     CESAREAN SECTION     x 4   CHOLECYSTECTOMY     COLOSTOMY     x2   CYSTOSCOPY WITH URETHRAL DILATATION N/A 11/15/2020   Procedure: CYSTOSCOPY WITH URETHRAL DILATION;  Surgeon: Malen Gauze, MD;  Location: [facility];  Service: Urology;  Laterality: N/A;   SALPINGECTOMY     TONSILLECTOMY      TONSILLECTOMY AND ADENOIDECTOMY      ALLERGIES: Allergies  Allergen Reactions   Other     Nickel staples   Bactrim [Sulfamethoxazole-Trimethoprim]     Hypoglycemia   Poison Ivy Treatments     Poison Ivy    Clindamycin Phosphate Nausea And Vomiting    FAMILY HISTORY: Family History  Problem Relation Age of Onset   Cancer Father        kidney   Cancer Mother        lung   Other Brother        heart issues    SOCIAL HISTORY: Social History   Tobacco Use   Smoking status: Never   Smokeless tobacco: Never  Vaping Use   Vaping Use: Never used  Substance Use Topics   Alcohol use: Never   Drug use: Never   Social History   Social History Narrative   ** Merged History Encounter **    Right handed   Leave alone-1 level home   Caffeine--soda/coffee     Objective:  Vital Signs:  BP (!) 149/69   Pulse 75   Ht 5' 4.5" (1.638 m)   Wt 129 lb (58.5 kg)   LMP  (LMP Unknown)   SpO2 98%   BMI 21.80 kg/m   General: General appearance: Awake and alert. No distress. Cooperative with exam.  Skin: No obvious rash or jaundice. HEENT: Atraumatic. Anicteric. Lungs: Non-labored breathing on room air  Extremities: No edema.  Neurological: Mental Status: Alert. Speech fluent. No pseudobulbar affect Cranial Nerves: CNII: No RAPD. Visual fields intact. CNIII, IV, VI: PERRL. No nystagmus. EOMI. No diplopia today. CN V: Facial sensation intact bilaterally to fine touch. CN VII: Significant weakness of bilateral orbicularis oculi and orbicularis oris. Mild ptosis at rest that does not worsen with sustained upgaze. CN VIII: Hears finger rub well bilaterally. CN IX: No hypophonia. CN X: Palate elevates symmetrically. CN XI: Full strength shoulder shrug bilaterally. CN XII: Tongue protrusion full and midline. No atrophy or fasciculations. Moderate dysarthria. Motor: Tone is normal.  Individual muscle group testing (MRC grade out of 5):  Movement     Neck flexion 4+     Neck extension 5     Right Left   Shoulder abduction 4+ 4+ Fatigable   Elbow flexion 5 5   Elbow extension 5 5   Finger extension 5 5   Finger flexion 5 5   Thumb flexion - FPL 5 5   Thumb  abduction - APB 5 5    Hip flexion 4+ 4+   Hip extension 5 5   Hip adduction 5 5   Hip abduction 5 5   Knee extension 5 5   Knee flexion 5 5   Dorsiflexion 5 5   Plantarflexion 5 5     Reflexes:  Right Left  Bicep 2+ 2+  Tricep 2+ 2+  BrRad 2+ 2+  Knee 2+ 2+  Ankle 0 0   Sensation: Pinprick: Diminished in bilateral lower extremities in distal to proximal gradient Coordination: Intact finger-to- nose-finger bilaterally. Gait: Able to rise from chair with arms crossed unassisted. Normal, narrow-based gait.   Lab and Test Review: No new results  Previously reviewed results: 09/17/22: AChR blocking abs: elevated to 52 HbA1c: 5.7 (high of 9.2 on 01/09/21) CMP unremarkable CBC w/ diff significant for Hb 9.4 (chronic) Lipid panel: Component     Latest Ref Rng 09/17/2022  Cholesterol, Total     100 - 199 mg/dL 098   Triglycerides     0 - 149 mg/dL 119 (H)   HDL Cholesterol     >39 mg/dL 52   VLDL Cholesterol Cal     5 - 40 mg/dL 28   LDL Chol Calc (NIH)     0 - 99 mg/dL 147 (H)   Total CHOL/HDL Ratio     0.0 - 4.4 ratio 3.6     TSH (04/15/22): 1.220 Vit D (04/15/22): 32.2   Imaging: MRI brain wo contrast (10/14/22): FINDINGS: Brain: Diffusion imaging does not show any acute or subacute infarction. No abnormality affects the brainstem or cerebellum. Incidental perivascular spaces in the right mid brain. Cerebral hemispheres show mild chronic small-vessel ischemic change of the deep and subcortical white matter. No cortical or large vessel territory stroke. No mass lesion, hemorrhage, hydrocephalus or extra-axial collection.   Vascular: Major vessels at the base of the brain show flow.   Skull and upper cervical spine: Negative   Sinuses/Orbits: Clear/normal   Other:  None   IMPRESSION: No acute finding. Mild chronic small-vessel ischemic change of the cerebral hemispheric white matter.  ASSESSMENT: This is Jessica Mclean, a 77 y.o. female with AChR ab positive, generalized myasthenia gravis. She is s/p IVIg from 11/14/22-11/17/21. She has been on prednisone and mestinon for a couple of weeks. There has been some minor improvement, but patient certainly still has significant symptoms including dysarthria, dysphagia, and orthopnea.   Plan: -Blood work today as previously planned (AChR abs, TPMT, TSH, B1, B12) -CT chest - patient has not scheduled. Will look into this for her. -Will redose IVIg monthly, likely for 6 months as a bridge to steroid sparing agent (Imuran vs Cellcept most likely, pending above labs). -Continue prednisone 20 mg daily -Continue Mestinon 60 mg TID  Return to clinic in 1 month  Total time spent reviewing records, interview, history/exam, documentation, and coordination of care on day of encounter:  40 min  Jacquelyne Balint, MD

## 2022-11-07 ENCOUNTER — Telehealth: Payer: Self-pay | Admitting: Neurology

## 2022-11-07 NOTE — Telephone Encounter (Signed)
Patient said her RX Dr.Hill gave her wasn't covered by her insurance. She paid for it but would like to know what to do moving forward

## 2022-11-07 NOTE — Telephone Encounter (Signed)
I called and spoke to patient she informed me that the Pharmacy made her pay $60 for her prednisone and her mestinon. She stated they told her it wasn't covered by insurance. She went ahead and paid the $60 but wanted to know why she had to pay that. I informed patient I would call her pharmacy to see what was going on.   Called patients pharmacy and they stated patient actually only paid $37.85 for the mestinon and got the prednisone for free. Pharmacy stated that patient was told that the mestinon was non-formulary and that is why she had to pay $37.85 for her 30 day supply of mestinon. Prednisone was free of charge and covered by insurance.

## 2022-11-10 ENCOUNTER — Ambulatory Visit (HOSPITAL_COMMUNITY): Payer: 59 | Admitting: Speech Pathology

## 2022-11-11 ENCOUNTER — Encounter (HOSPITAL_COMMUNITY): Payer: Self-pay | Admitting: Speech Pathology

## 2022-11-11 ENCOUNTER — Ambulatory Visit (HOSPITAL_COMMUNITY): Payer: 59 | Admitting: Speech Pathology

## 2022-11-11 DIAGNOSIS — R41841 Cognitive communication deficit: Secondary | ICD-10-CM

## 2022-11-11 DIAGNOSIS — R471 Dysarthria and anarthria: Secondary | ICD-10-CM

## 2022-11-11 NOTE — Therapy (Signed)
OUTPATIENT SPEECH LANGUAGE PATHOLOGY TREATMENT NOTE   Patient Name: Jessica Mclean MRN: [id number] DOB:[birth year], 77 y.o., female Today's Date: 11/11/2022   PCP: Anabel Halon, MD REFERRING PROVIDER: Anabel Halon, MD   END OF SESSION:   End of Session - 11/11/22 1518     Visit Number 3    Number of Visits 5    Date for SLP Re-Evaluation 11/13/22    Authorization Type [employer] Medicare    SLP Start Time 1430    SLP Stop Time  1515    SLP Time Calculation (min) 45 min    Activity Tolerance Patient tolerated treatment well             Past Medical History:  Diagnosis Date   Bleeding disorder (HCC)    Cervical cancer (HCC) 2010   Diabetes mellitus without complication (HCC)    type 2   Diabetes mellitus without complication (HCC)    Heart murmur    High cholesterol    HLD (hyperlipidemia)    Hypertension    Mental disorder    depression; suicidal ideation in 2017   Sleep apnea    Vitamin D deficiency    Past Surgical History:  Procedure Laterality Date   BILATERAL OOPHORECTOMY     CESAREAN SECTION     x 4   CHOLECYSTECTOMY     COLOSTOMY     x2   CYSTOSCOPY WITH URETHRAL DILATATION N/A 11/15/2020   Procedure: CYSTOSCOPY WITH URETHRAL DILATION;  Surgeon: Malen Gauze, MD;  Location: [facility];  Service: Urology;  Laterality: N/A;   SALPINGECTOMY     TONSILLECTOMY     TONSILLECTOMY AND ADENOIDECTOMY     Patient Active Problem List   Diagnosis Date Noted   Myasthenia gravis without acute exacerbation (HCC) 09/24/2022   Aphasia 09/17/2022   Irritant contact dermatitis due to plants, except food 09/17/2022   Encounter for general adult medical examination with abnormal findings 04/15/2022   Hypoglycemia 03/25/2022   Dizziness 12/02/2021   History of cervical cancer 12/02/2021   Arthralgia of shoulder region 06/18/2021   Chronic cough 06/18/2021   Colovaginal fistula 06/18/2021   Depression 06/18/2021   Hematuria 06/18/2021   History of asthma  06/18/2021   Hyperlipidemia 06/18/2021   Low back pain 06/18/2021   Migraine without aura and without status migrainosus, not intractable 06/18/2021   Obstructive sleep apnea 06/18/2021   Slow transit constipation 06/18/2021   Chronic cystitis 06/18/2021   Microcytic anemia 01/15/2021   Postprocedural female urethral stricture 11/27/2020   Idiopathic peripheral neuropathy 06/14/2020   Diabetic neuropathy (HCC) 06/14/2020   Gross hematuria 06/11/2020   Urge incontinence 03/08/2020   Essential hypertension 11/23/2019   Type 2 diabetes mellitus with hyperlipidemia (HCC) 11/23/2019   History of TIA (transient ischemic attack) 11/22/2019   Bulge of lumbar disc without myelopathy 11/13/2012   Cervical cancer (HCC) 04/13/2011    ONSET DATE: 09/17/2022    REFERRING DIAG: R47.01 (ICD-10-CM) - Aphasia   THERAPY DIAG:  Dysarthria and anarthria   Cognitive communication deficit   Rationale for Evaluation and Treatment: Rehabilitation  PERTINENT HISTORY: Jessica Mclean is a 77 yo female who was referred for a cognitive linguistic evaluation by Anabel Halon, MD, her PCP, due to Pt reports of difficulty with her speech, eating, and word finding.  PATIENT EDUCATION: Education details: Pt has been to see the neurologist and medications have been prescribe. D/C Person educated: Patient Education method: Explanation Education comprehension: verbalized understanding  SUBJECTIVE:   SUBJECTIVE  STATEMENT: "I started taking the medications."  PAIN:  Are you having pain? No   OBJECTIVE:   GOALS: Goals reviewed with patient? Yes   SHORT TERM GOALS: Target date: 11/17/2022   Pt will implement word-finding strategies with 90% accuracy when unable to verbalize desired word in conversation/functional tasks with min assist.    Baseline: mi/mod cues in 10 minute conversation Goal status: Met   2.  Pt will describe objects and pictures by providing at least three salient features as judged  by clinician with 90% acc when provided min cues.  Baseline: 80% Goal status: Met   3.  Pt will utilize speech intelligibility strategies (over articulation, reduced speaking rate, and pacing) at the conversation level with 80% acc and min cues. Baseline: introduced this date, mod assist Goal status: Met     LONG TERM GOALS:  Same as short term goals   ASSESSMENT:   CLINICAL IMPRESSION: (from initial evaluation on 10/07/2022) Patient is a 77 y.o. female who was seen today for a cognitive linguistic evaluation after a referral from her PCP, Dr. Patterson Hammersmith. Pt presents with mild/mod cognitive deficits (attention and memory), mild dysnomia with word finding difficulties for more low frequency words, and moderate flaccid dysarthria characterized by imprecise articulation and increasing hypernasality with lengthier speech tasks. Pt exhibited vocal fatigue and muscle weakness with oral reading task and intelligibility significantly decreased (tongue progressively protruding). Oral motor examination revealed initially seemingly adequate strength, however with repetition of exercise, performance decreased significantly (lingual and labial). 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 Pt does not exhibit hoarseness and pitch variation adequate. She experiences oral dysphagia with solid foods and drooling. Pt has to finely chop all her foods in order to manipulate and swallow. She was evaluated with cup sips water, puree, and regular textures. She does not exhibit signs of pharyngeal dysphagia, but did exhibit impaired mastication, bolus manipulation, and prolonged oral transit with solids. In addition, Pt reports word finding deficits and often uses circumlocution strategies ("the stand for your camera" for tripod) to communicate. She scored a 10/15 on the [facility] Test short form, but improved to 15/15 when phonemic cue provided. She scored a 21/30 on the  [facility] with deficits in memory predominantly. Recommend neurology consult to evaluate symptoms of progressive weakness with speech and swallow tasks. While Pt does exhibit memory and word finding deficits, this appears to be separate from her significant dysarthria and oral dysphagia. Will plan to see Pt for SLP therapy 1x/week for 4 weeks with reassessment of needs (she will have MRI next week and hopefully see a neurologist soon) and focus on energy conservation with speech and swallow tasks, compensatory strategies, and word finding strategies. Pt lives alone and does not currently drive, but has good family support and a neighbor who helps drive her to appointments. Her daughter is one of the hospitalists at [facility].    OBJECTIVE IMPAIRMENTS: include memory, expressive language, dysarthria, and dysphagia. These impairments are limiting patient from effectively communicating at home and in community and safety when swallowing. Factors affecting potential to achieve goals and functional outcome are  unknown etiology of deficits . Patient will benefit from skilled SLP services to address above impairments and improve overall function.   REHAB POTENTIAL: Good   PLAN:   D/C from SLP therapy, see below   PLANNED INTERVENTIONS: Aspiration precaution training, Cueing hierachy, SLP instruction and feedback, Compensatory strategies, Patient/family education, and  Re-evaluation  TODAY'S TREATMENT:    Pt unaccompanied to therapy today.  She continues to present with mixed dysarthria which becomes more unintelligible with lengthier conversation. She was reminded to chop her meats finely and eat slowly.                                                                                                                                     DATE: 11/11/22   SPEECH THERAPY DISCHARGE SUMMARY  Visits from Start of Care: 3  Current functional level related to goals / functional outcomes: Met   Remaining  deficits: Dysarthria and dysphagia   Education / Equipment: Pt to follow the medication regimen per the neurologist   Patient agrees to discharge. Patient goals were met. Patient is being discharged due to the patient's request..    Thank you,  Havery Moros, CCC-SLP [phone]   Ovidio Steele, CCC-SLP 11/11/2022, 3:19 PM

## 2022-11-13 ENCOUNTER — Telehealth: Payer: Self-pay

## 2022-11-13 NOTE — Telephone Encounter (Signed)
Received a voicemail from Frederick with [facility]. He stated a dose clarification for patients medication is needed. Vickki Muff stated that what was checked is 0.4 mg per kg but what they think it should be is gram per kg. Please advise.  Asked for a call back [phone].

## 2022-11-13 NOTE — Telephone Encounter (Signed)
It should be 0.4g/kg

## 2022-11-13 NOTE — Telephone Encounter (Signed)
Called and informed [facility] that it should be 0.4g/kg.

## 2022-11-14 DIAGNOSIS — G7000 Myasthenia gravis without (acute) exacerbation: Secondary | ICD-10-CM | POA: Diagnosis not present

## 2022-11-15 DIAGNOSIS — G7000 Myasthenia gravis without (acute) exacerbation: Secondary | ICD-10-CM | POA: Diagnosis not present

## 2022-11-16 DIAGNOSIS — G7000 Myasthenia gravis without (acute) exacerbation: Secondary | ICD-10-CM | POA: Diagnosis not present

## 2022-11-17 ENCOUNTER — Ambulatory Visit (HOSPITAL_COMMUNITY): Payer: 59 | Admitting: Speech Pathology

## 2022-11-17 DIAGNOSIS — G7000 Myasthenia gravis without (acute) exacerbation: Secondary | ICD-10-CM | POA: Diagnosis not present

## 2022-11-18 ENCOUNTER — Ambulatory Visit: Payer: 59 | Admitting: Neurology

## 2022-11-18 ENCOUNTER — Ambulatory Visit (HOSPITAL_COMMUNITY): Payer: 59 | Admitting: Speech Pathology

## 2022-11-18 DIAGNOSIS — G7000 Myasthenia gravis without (acute) exacerbation: Secondary | ICD-10-CM | POA: Diagnosis not present

## 2022-11-19 ENCOUNTER — Other Ambulatory Visit: Payer: 59

## 2022-11-19 ENCOUNTER — Encounter: Payer: Self-pay | Admitting: Neurology

## 2022-11-19 ENCOUNTER — Ambulatory Visit (INDEPENDENT_AMBULATORY_CARE_PROVIDER_SITE_OTHER): Payer: 59 | Admitting: Neurology

## 2022-11-19 VITALS — BP 149/69 | HR 75 | Ht 64.5 in | Wt 129.0 lb

## 2022-11-19 DIAGNOSIS — R1310 Dysphagia, unspecified: Secondary | ICD-10-CM

## 2022-11-19 DIAGNOSIS — R471 Dysarthria and anarthria: Secondary | ICD-10-CM

## 2022-11-19 DIAGNOSIS — H532 Diplopia: Secondary | ICD-10-CM

## 2022-11-19 DIAGNOSIS — H02403 Unspecified ptosis of bilateral eyelids: Secondary | ICD-10-CM | POA: Diagnosis not present

## 2022-11-19 DIAGNOSIS — M6281 Muscle weakness (generalized): Secondary | ICD-10-CM

## 2022-11-19 DIAGNOSIS — G7000 Myasthenia gravis without (acute) exacerbation: Secondary | ICD-10-CM

## 2022-11-19 NOTE — Patient Instructions (Addendum)
Please get your blood work today.  You need a CT of your chest to look at your thymus. [facility] has tried to call you. Please contact them directly by calling [phone] to schedule.  Continue prednisone 20 mg daily.  Continue Mestinon 60 mg three times daily.  We will also do monthly IVIg for now given that you have not had significant improvement yet.  I would like to see you back in clinic in 1 month. Please let me know if you have any questions or concerns in the meantime.   If you have worsening swallowing, worsening breathing, or severe weakness, this could be an emergency for which I recommend going to the nearest ED.   The physicians and staff at [facility] are committed to providing excellent care. You may receive a survey requesting feedback about your experience at our office. We strive to receive "very good" responses to the survey questions. If you feel that your experience would prevent you from giving the office a "very good " response, please contact our office to try to remedy the situation. We may be reached at [phone]. Thank you for taking the time out of your busy day to complete the survey.  Jacquelyne Balint, MD [facility]  Preventing Falls at [facility] are common, often dreaded events in the lives of older people. Aside from the obvious injuries and even death that may result, fall can cause wide-ranging consequences including loss of independence, mental decline, decreased activity and mobility. Younger people are also at risk of falling, especially those with chronic illnesses and fatigue.  Ways to reduce risk for falling Examine diet and medications. Warm foods and alcohol dilate blood vessels, which can lead to dizziness when standing. Sleep aids, antidepressants and pain medications can also increase the likelihood of a fall.  Get a vision exam. Poor vision, cataracts and glaucoma increase the chances of falling.  Check foot gear.  Shoes should fit snugly and have a sturdy, nonskid sole and a broad, low heel  Participate in a physician-approved exercise program to build and maintain muscle strength and improve balance and coordination. Programs that use ankle weights or stretch bands are excellent for muscle-strengthening. Water aerobics programs and low-impact Tai Chi programs have also been shown to improve balance and coordination.  Increase vitamin D intake. Vitamin D improves muscle strength and increases the amount of calcium the body is able to absorb and deposit in bones.  How to prevent falls from common hazards Floors - Remove all loose wires, cords, and throw rugs. Minimize clutter. Make sure rugs are anchored and smooth. Keep furniture in its usual place.  Chairs -- Use chairs with straight backs, armrests and firm seats. Add firm cushions to existing pieces to add height.  Bathroom - Install grab bars and non-skid tape in the tub or shower. Use a bathtub transfer bench or a shower chair with a back support Use an elevated toilet seat and/or safety rails to assist standing from a low surface. Do not use towel racks or bathroom tissue holders to help you stand.  Lighting - Make sure halls, stairways, and entrances are well-lit. Install a night light in your bathroom or hallway. Make sure there is a light switch at the top and bottom of the staircase. Turn lights on if you get up in the middle of the night. Make sure lamps or light switches are within reach of the bed if you have to get up during the night.  Kitchen -  Install non-skid rubber mats near the sink and stove. Clean spills immediately. Store frequently used utensils, pots, pans between waist and eye level. This helps prevent reaching and bending. Sit when getting things out of lower cupboards.  Living room/ Bedrooms - Place furniture with wide spaces in between, giving enough room to move around. Establish a route through the living room that gives you  something to hold onto as you walk.  Stairs - Make sure treads, rails, and rugs are secure. Install a rail on both sides of the stairs. If stairs are a threat, it might be helpful to arrange most of your activities on the lower level to reduce the number of times you must climb the stairs.  Entrances and doorways - Install metal handles on the walls adjacent to the doorknobs of all doors to make it more secure as you travel through the doorway.  Tips for maintaining balance Keep at least one hand free at all times. Try using a backpack or fanny pack to hold things rather than carrying them in your hands. Never carry objects in both hands when walking as this interferes with keeping your balance.  Attempt to swing both arms from front to back while walking. This might require a conscious effort if Parkinson's disease has diminished your movement. It will, however, help you to maintain balance and posture, and reduce fatigue.  Consciously lift your feet off of the ground when walking. Shuffling and dragging of the feet is a common culprit in losing your balance.  When trying to navigate turns, use a "U" technique of facing forward and making a wide turn, rather than pivoting sharply.  Try to stand with your feet shoulder-length apart. When your feet are close together for any length of time, you increase your risk of losing your balance and falling.  Do one thing at a time. Don't try to walk and accomplish another task, such as reading or looking around. The decrease in your automatic reflexes complicates motor function, so the less distraction, the better.  Do not wear rubber or gripping soled shoes, they might "catch" on the floor and cause tripping.  Move slowly when changing positions. Use deliberate, concentrated movements and, if needed, use a grab bar or walking aid. Count 15 seconds between each movement. For example, when rising from a seated position, wait 15 seconds after standing to  begin walking.  If balance is a continuous problem, you might want to consider a walking aid such as a cane, walking stick, or walker. Once you've mastered walking with help, you might be ready to try it on your own again.

## 2022-11-30 ENCOUNTER — Other Ambulatory Visit: Payer: Self-pay | Admitting: Internal Medicine

## 2022-12-03 ENCOUNTER — Ambulatory Visit: Admission: RE | Admit: 2022-12-03 | Payer: 59 | Source: Ambulatory Visit

## 2022-12-03 DIAGNOSIS — R471 Dysarthria and anarthria: Secondary | ICD-10-CM | POA: Diagnosis not present

## 2022-12-03 DIAGNOSIS — R1310 Dysphagia, unspecified: Secondary | ICD-10-CM | POA: Diagnosis not present

## 2022-12-03 DIAGNOSIS — H532 Diplopia: Secondary | ICD-10-CM

## 2022-12-03 DIAGNOSIS — G7000 Myasthenia gravis without (acute) exacerbation: Secondary | ICD-10-CM | POA: Diagnosis not present

## 2022-12-03 DIAGNOSIS — M6281 Muscle weakness (generalized): Secondary | ICD-10-CM

## 2022-12-03 DIAGNOSIS — H02403 Unspecified ptosis of bilateral eyelids: Secondary | ICD-10-CM

## 2022-12-03 MED ORDER — IOPAMIDOL (ISOVUE-300) INJECTION 61%
75.0000 mL | Freq: Once | INTRAVENOUS | Status: AC | PRN
  Administered 2022-12-03: 75 mL via INTRAVENOUS

## 2022-12-04 ENCOUNTER — Other Ambulatory Visit: Payer: 59

## 2022-12-05 LAB — ACETYLCHOLINE RECEPTOR, MODULATING: Acetylchol Modul Ab: 81 % Inhibition — ABNORMAL HIGH

## 2022-12-05 LAB — VITAMIN B12: Vitamin B-12: 357 pg/mL (ref 200–1100)

## 2022-12-05 LAB — THIOPURINE METHYLTRANSFERASE (TPMT), RBC: Thiopurine Methyltransferase, RBC: 15 nmol/hr/mL RBC

## 2022-12-05 LAB — ACETYLCHOLINE RECEPTOR, BINDING: A CHR BINDING ABS: 2.05 nmol/L — ABNORMAL HIGH

## 2022-12-05 LAB — TSH: TSH: 0.56 mIU/L (ref 0.40–4.50)

## 2022-12-05 LAB — VITAMIN B1: Vitamin B1 (Thiamine): 14 nmol/L (ref 8–30)

## 2022-12-18 ENCOUNTER — Ambulatory Visit: Payer: 59 | Admitting: Internal Medicine

## 2022-12-19 DIAGNOSIS — G7000 Myasthenia gravis without (acute) exacerbation: Secondary | ICD-10-CM | POA: Diagnosis not present

## 2022-12-19 NOTE — Progress Notes (Signed)
I saw Jessica Mclean by virtual visit on 12/26/22 in follow up for AChR ab positive, generalized myasthenia gravis.  Consent was obtained for video visit:  Yes.   Answered questions that patient had about telehealth interaction:  Yes.   I discussed the limitations, risks, security and privacy concerns of performing an evaluation and management service by telemedicine. I also discussed with the patient that there may be a patient responsible charge related to this service. The patient expressed understanding and agreed to proceed.  Pt location: Home Physician Location: office  HPI: Jessica Mclean is a 77 y.o. year old female with a history of HTN, IDDM, HLD, cervical cancer s/p radiation, TIA, depression, vit D deficiency, sleep apnea who we last saw on 11/19/22.  To briefly review: Initial consultation (11/06/22): Patient woke up over a year ago and had difficulty lifting her head and had vertigo. She called her daughter who is a doctor, who insisted patient go to the hospital. She also had dysarthria. This was first thing in the morning. She had the COVID vaccine the day before, so symptoms were attributed to that. The vertigo resolved. She continues to have falls. She is not sure why. She had an MRI finally in 09/2022 (about 1 month ago) that did not show evidence of a stroke. Over the last year, she has noticed symptoms are worse with fatigue, end of day, and with use. PCP checked AChR blocking ab and was elevated to 52.    Patient has stopped taking all of her medications due to concerns of what would make myasthenia worse.   Current MG symptoms: Ptosis: Yes, especially while watching TV Double vision: Yes Speech: Yes, worse when talking more and at end of day Chewing: Yes Swallowing: Yes, choking on solids. Liquids tend to dribble out of mouth. Breathing: Denies orthopnea Arm strength: No issues Leg strength: Does not think she is weak, but does endorse falls.   She is not on any  medication for myasthenia gravis.   Of note patient takes gabapentin 100 mg qhs for diabetic neuropathy. This helps.   Patient is currently going to speech therapy. She is not sure she will continue due to costly co-pay.   She does not report any constitutional symptoms like fever, night sweats, anorexia or unintentional weight loss.   EtOH use: None  Restrictive diet? Vegan, not on B12. She has been eating much less lately, mostly bread and tomatoes Family history of neuropathy/myopathy/neurologic disease? No  11/19/22: Patient has not gotten her blood work or CT chest. Patient has not been called to get CT chest per her report, but it appears [facility] has been trying to reach patient (per notes).   She got IVIg at home from 11/14/22-11/18/22. She had significant fatigue during the treatment.   Current MG symptoms: Ptosis: About the same as prior Double vision: About the same as prior Speech: Has been clearer Chewing: Better than prior Swallowing: A little improvement Breathing: Did get woken up with shortness of breath Arm strength: Feels weak, which she did not mention at last visit. Fatigues easily. Leg strength: Fatigues easily. Fell this morning.   Current medications:  -Prednisone 20 mg daily -Mestinon 60 mg TID S/p IVIg 0.4 g/kg/day for 5 days (11/14/22-11/18/22)   Side effects: None  Most recent Assessment and Plan (11/19/22): This is Jessica Mclean, a 77 y.o. female with AChR ab positive, generalized myasthenia gravis. She is s/p IVIg from 11/14/22-11/17/21. She has been on prednisone and mestinon for a  couple of weeks. There has been some minor improvement, but patient certainly still has significant symptoms including dysarthria, dysphagia, and orthopnea.    Plan: -Blood work today as previously planned (AChR abs, TPMT, TSH, B1, B12) -CT chest - patient has not scheduled. Will look into this for her. -Will redose IVIg monthly, likely for 6 months as a bridge to  steroid sparing agent (Imuran vs Cellcept most likely, pending above labs). -Continue prednisone 20 mg daily -Continue Mestinon 60 mg TID  Since their last visit: AChR abs were again positive, but lab work was otherwise unremarkable, including TPMT activity. CT chest showed no evidence of thymoma. Patient thinks the IVIg is really helping. She take take pills and can swallow much better.  She received her monthly maintenance of IVIg on 12/14/22. She is tolerating this well. Her next dose 01/19/23.  Current MG symptoms: Ptosis: Still having, not sure if this has improved Double vision: Very occasional, much better Speech: Thinks this has improved, still has some slurred Chewing: Much improved Swallowing: Much improved Breathing: Denies orthopnea Arm strength: Has to take frequent breaks to keep strength or will get weak Leg strength: Has to take frequent breaks as above  Current medications:  -Prednisone 20 mg daily (has only been taking once daily) -Mestinon 60 mg TID  Side effects: Occasional diarrhea   MEDICATIONS:  Outpatient Encounter Medications as of 12/26/2022  Medication Sig   albuterol (VENTOLIN HFA) 108 (90 Base) MCG/ACT inhaler Inhale 2 puffs into the lungs every 6 (six) hours as needed for wheezing or shortness of breath.   amLODipine (NORVASC) 10 MG tablet TAKE 1 TABLET BY MOUTH DAILY   aspirin-acetaminophen-caffeine (EXCEDRIN EXTRA STRENGTH) 250-250-65 MG tablet Take 2 tablets by mouth in the morning.   atorvastatin (LIPITOR) 80 MG tablet TAKE 1 TABLET BY MOUTH DAILY   betamethasone dipropionate 0.05 % cream Apply topically 2 (two) times daily.   blood glucose meter kit and supplies Dispense based on patient and insurance preference. Use up to four times daily as directed. (FOR ICD-10 E10.9, E11.9).   Blood Glucose Monitoring Suppl (ACCU-CHEK GUIDE) w/Device KIT    calcium-vitamin D (OSCAL-500) 500-400 MG-UNIT tablet Take 1 tablet by mouth daily.   cephALEXin (KEFLEX)  500 MG capsule Take 1 capsule (500 mg total) by mouth 3 (three) times daily.   Continuous Blood Gluc Receiver (DEXCOM G7 RECEIVER) DEVI Use it to check blood glucose 3 times before meals, at bedtime and as needed.   Continuous Glucose Sensor (DEXCOM G7 SENSOR) MISC Use it to check blood glucose 3 times before meals, at bedtime and as needed.   desonide (DESOWEN) 0.05 % cream Apply topically 2 (two) times daily.   gabapentin (NEURONTIN) 100 MG capsule Take 1 capsule (100 mg total) by mouth at bedtime.   Glucose Blood (BLOOD GLUCOSE TEST STRIPS) STRP Use to test once daily dx E11.9   Lancets MISC Use to test once daily DX: E 11.9   meclizine (ANTIVERT) 25 MG tablet Take 1 tablet (25 mg total) by mouth 3 (three) times daily as needed for dizziness.   Multiple Vitamins-Minerals (PRESERVISION AREDS PO) Take 2 capsules by mouth daily.   omega-3 acid ethyl esters (LOVAZA) 1 g capsule Take 2 g by mouth 2 (two) times daily.   phenazopyridine (PYRIDIUM) 100 MG tablet Take 1 tablet (100 mg total) by mouth 3 (three) times daily as needed for pain (Dysuria).   polyethylene glycol powder (GLYCOLAX/MIRALAX) 17 GM/SCOOP powder Take 17 g by mouth daily as needed  for mild constipation or moderate constipation.    predniSONE (DELTASONE) 10 MG tablet Take 2 tablets (20 mg total) by mouth daily with breakfast. Take 1 tablets (10 mg) daily for 1 week, then increase to 2 tablets (20 mg) daily thereafter (Patient taking differently: Take 20 mg by mouth daily with breakfast. 2 tablets (20 mg) daily)   pyridostigmine (MESTINON) 60 MG tablet Take 1 tablet (60 mg total) by mouth 3 (three) times daily.   tamsulosin (FLOMAX) 0.4 MG CAPS capsule Take 1 capsule (0.4 mg total) by mouth daily after supper.   telmisartan (MICARDIS) 40 MG tablet TAKE 1 TABLET BY MOUTH DAILY   No facility-administered encounter medications on file as of 12/26/2022.    PAST MEDICAL HISTORY: Past Medical History:  Diagnosis Date   Bleeding disorder  (HCC)    Cervical cancer (HCC) 2010   Diabetes mellitus without complication (HCC)    type 2   Diabetes mellitus without complication (HCC)    Heart murmur    High cholesterol    HLD (hyperlipidemia)    Hypertension    Mental disorder    depression; suicidal ideation in 2017   Sleep apnea    Vitamin D deficiency     PAST SURGICAL HISTORY: Past Surgical History:  Procedure Laterality Date   BILATERAL OOPHORECTOMY     CESAREAN SECTION     x 4   CHOLECYSTECTOMY     COLOSTOMY     x2   CYSTOSCOPY WITH URETHRAL DILATATION N/A 11/15/2020   Procedure: CYSTOSCOPY WITH URETHRAL DILATION;  Surgeon: Malen Gauze, MD;  Location: [facility];  Service: Urology;  Laterality: N/A;   SALPINGECTOMY     TONSILLECTOMY     TONSILLECTOMY AND ADENOIDECTOMY      ALLERGIES: Allergies  Allergen Reactions   Other     Nickel staples   Bactrim [Sulfamethoxazole-Trimethoprim]     Hypoglycemia   Poison Ivy Treatments     Poison Ivy    Clindamycin Phosphate Nausea And Vomiting    FAMILY HISTORY: Family History  Problem Relation Age of Onset   Cancer Father        kidney   Cancer Mother        lung   Other Brother        heart issues    SOCIAL HISTORY: Social History   Tobacco Use   Smoking status: Never   Smokeless tobacco: Never  Vaping Use   Vaping status: Never Used  Substance Use Topics   Alcohol use: Never   Drug use: Never   Social History   Social History Narrative   ** Merged History Encounter **    Right handed   Leave alone-1 level home   Caffeine--soda/coffee     Objective:  Vital Signs: No vitals collected on virtual visit.  GEN:  The patient appears stated age and is in NAD.  Neurological examination: Orientation: The patient is alert and oriented x3. Cranial nerves: There is good facial symmetry. No clear ptosis. EOMI without reported diplopia in any direction. Mild dysarthria  Motor: Strength is at least antigravity x 4.   Gait and Station: The  patient does not have difficulty arising out of a deep-seated chair without the use of the hands. Gait appears normal by video.    Lab and Test Review: New results: 11/23/22: B1 wnl TSH wnl TPMT: normal activity B12: 357 AChR binding ab: 2.05, modulating 81  CT chest w contrast (12/03/22): FINDINGS: Cardiovascular: The cardiac size is normal. There  is no pericardial effusion. There is three-vessel coronary artery calcification greatest in the proximal LAD.   There are mild scattered calcific plaques in the aorta. The great vessels are clear. There is no aortic aneurysm, dissection or stenosis. The pulmonary arteries and veins are normal caliber. The pulmonary arteries are centrally clear.   Mediastinum/Nodes: No enlarged mediastinal, hilar, or axillary lymph nodes. Thyroid gland, trachea, and esophagus demonstrate no significant findings.   Lungs/Pleura: Small right posterior diaphragmatic fat herniation. There is a calcified granuloma in the right lower lobe. The lungs hyperexpanded but clear of infiltrates and nodules. There is no pleural effusion, thickening or pneumothorax.   Upper Abdomen: The liver is mildly steatotic. Gallbladder is absent with prominent common bile duct measuring 12 mm most likely due to prior cholecystectomy, with mild central intrahepatic biliary prominence.   Laboratory and clinical correlation suggested. No acute upper abdominal findings. No chest wall mass is seen.   Musculoskeletal: Osteopenia with thoracic kyphosis and multilevel degenerative disc disease with spondylosis. No acute or other significant osseous findings. The ribcage is intact.   IMPRESSION: 1. No acute chest CT or significant mediastinal findings. 2. Aortic and coronary artery atherosclerosis. 3. Hyperinflated.  No focal infiltrates. 4. Mild hepatic steatosis. 5. Prior cholecystectomy with prominent common bile duct and mild central intrahepatic biliary prominence. 6.  Osteopenia and degenerative change.  Previously reviewed results: 09/17/22: AChR blocking abs: elevated to 52 HbA1c: 5.7 (high of 9.2 on 01/09/21) CMP unremarkable CBC w/ diff significant for Hb 9.4 (chronic) Lipid panel: Component     Latest Ref Rng 09/17/2022  Cholesterol, Total     100 - 199 mg/dL 250   Triglycerides     0 - 149 mg/dL 539 (H)   HDL Cholesterol     >39 mg/dL 52   VLDL Cholesterol Cal     5 - 40 mg/dL 28   LDL Chol Calc (NIH)     0 - 99 mg/dL 767 (H)   Total CHOL/HDL Ratio     0.0 - 4.4 ratio 3.6     TSH (04/15/22): 1.220 Vit D (04/15/22): 32.2   Imaging: MRI brain wo contrast (10/14/22): FINDINGS: Brain: Diffusion imaging does not show any acute or subacute infarction. No abnormality affects the brainstem or cerebellum. Incidental perivascular spaces in the right mid brain. Cerebral hemispheres show mild chronic small-vessel ischemic change of the deep and subcortical white matter. No cortical or large vessel territory stroke. No mass lesion, hemorrhage, hydrocephalus or extra-axial collection.   Vascular: Major vessels at the base of the brain show flow.   Skull and upper cervical spine: Negative   Sinuses/Orbits: Clear/normal   Other: None   IMPRESSION: No acute finding. Mild chronic small-vessel ischemic change of the cerebral hemispheric white matter.  ASSESSMENT: This is Justin Brownfield, a 77 y.o. female with  AChR ab positive, generalized myasthenia gravis. CT chest showed no evidence of thymoma. She is s/p IVIg from 11/14/22-11/17/21 and 12/14/22. Next dose is 01/19/23. She is improving but with residual dysarthria and fatigable weakness currently. After discussion, we will start Imuran today and use IVIg as a bridge to Imuran.   Plan: -Continue monthly IVIg (next dose 01/19/23) -Reduce Prednisone: 10 mg daily (has already been taking it this way) -Mestinon 60 mg TID -Start Imuran 50 mg daily for 1 week, then 100 mg daily thereafter (will target 2-3  mg/kg dosing) -Will need monthly CBC w/ diff and CMP for 3 months. Will discuss with [facility] who gives patient IVIg to  see if they can draw as patient does not have reliable transportation.  Return to clinic in 3 months   Jacquelyne Balint, MD

## 2022-12-20 ENCOUNTER — Other Ambulatory Visit: Payer: Self-pay | Admitting: Internal Medicine

## 2022-12-20 DIAGNOSIS — L247 Irritant contact dermatitis due to plants, except food: Secondary | ICD-10-CM

## 2022-12-25 ENCOUNTER — Ambulatory Visit: Payer: 59 | Admitting: Neurology

## 2022-12-25 ENCOUNTER — Other Ambulatory Visit: Payer: Self-pay | Admitting: Internal Medicine

## 2022-12-25 ENCOUNTER — Ambulatory Visit: Payer: 59 | Admitting: Internal Medicine

## 2022-12-26 ENCOUNTER — Other Ambulatory Visit: Payer: Self-pay

## 2022-12-26 ENCOUNTER — Encounter: Payer: Self-pay | Admitting: Neurology

## 2022-12-26 ENCOUNTER — Telehealth: Payer: 59 | Admitting: Neurology

## 2022-12-26 ENCOUNTER — Telehealth: Payer: Self-pay | Admitting: Neurology

## 2022-12-26 VITALS — Ht 64.5 in | Wt 129.0 lb

## 2022-12-26 DIAGNOSIS — G7000 Myasthenia gravis without (acute) exacerbation: Secondary | ICD-10-CM

## 2022-12-26 DIAGNOSIS — M6281 Muscle weakness (generalized): Secondary | ICD-10-CM

## 2022-12-26 DIAGNOSIS — H532 Diplopia: Secondary | ICD-10-CM

## 2022-12-26 DIAGNOSIS — H02403 Unspecified ptosis of bilateral eyelids: Secondary | ICD-10-CM

## 2022-12-26 DIAGNOSIS — R471 Dysarthria and anarthria: Secondary | ICD-10-CM

## 2022-12-26 DIAGNOSIS — R1310 Dysphagia, unspecified: Secondary | ICD-10-CM

## 2022-12-26 MED ORDER — AZATHIOPRINE 50 MG PO TABS: 100.0000 mg | ORAL_TABLET | Freq: Every day | ORAL | 11 refills | Status: DC

## 2022-12-26 MED ORDER — PREDNISONE 10 MG PO TABS
10.0000 mg | ORAL_TABLET | Freq: Every day | ORAL | 5 refills | Status: DC
Start: 2022-12-26 — End: 2023-02-06

## 2022-12-26 MED ORDER — AZATHIOPRINE 50 MG PO TABS
100.0000 mg | ORAL_TABLET | Freq: Every day | ORAL | 11 refills | Status: DC
Start: 2022-12-26 — End: 2022-12-26

## 2022-12-26 MED ORDER — PREDNISONE 10 MG PO TABS
10.0000 mg | ORAL_TABLET | Freq: Every day | ORAL | 5 refills | Status: DC
Start: 2022-12-26 — End: 2022-12-26

## 2022-12-26 NOTE — Telephone Encounter (Signed)
Patient called during lunch hours, stating that she received a call and was returning a call

## 2022-12-26 NOTE — Telephone Encounter (Signed)
Called pt back to connect with Dr. Loleta Chance for a virtual visit.

## 2022-12-29 ENCOUNTER — Other Ambulatory Visit: Payer: Self-pay

## 2023-01-01 ENCOUNTER — Encounter: Payer: Self-pay | Admitting: Internal Medicine

## 2023-01-01 ENCOUNTER — Ambulatory Visit (INDEPENDENT_AMBULATORY_CARE_PROVIDER_SITE_OTHER): Payer: 59 | Admitting: Internal Medicine

## 2023-01-01 DIAGNOSIS — E785 Hyperlipidemia, unspecified: Secondary | ICD-10-CM

## 2023-01-01 DIAGNOSIS — E1169 Type 2 diabetes mellitus with other specified complication: Secondary | ICD-10-CM

## 2023-01-01 NOTE — Progress Notes (Signed)
Jessica Mclean presented today for assistance in setting up her Dexcom CGM.  Nursing staff assisted her in placing the device on her arm and ensuring that the sensor worked with her phone.  She will not be charged for today's appointment.

## 2023-01-09 ENCOUNTER — Telehealth: Payer: Self-pay

## 2023-01-09 NOTE — Telephone Encounter (Signed)
Faxed new office notes to [facility] 01/09/23. Faxed to [phone]

## 2023-01-15 ENCOUNTER — Ambulatory Visit: Payer: 59 | Admitting: Internal Medicine

## 2023-01-15 ENCOUNTER — Encounter: Payer: Self-pay | Admitting: Internal Medicine

## 2023-01-15 VITALS — BP 120/61 | HR 68 | Ht 64.5 in | Wt 123.4 lb

## 2023-01-15 DIAGNOSIS — E1149 Type 2 diabetes mellitus with other diabetic neurological complication: Secondary | ICD-10-CM | POA: Diagnosis not present

## 2023-01-15 DIAGNOSIS — E1169 Type 2 diabetes mellitus with other specified complication: Secondary | ICD-10-CM

## 2023-01-15 DIAGNOSIS — Z23 Encounter for immunization: Secondary | ICD-10-CM | POA: Diagnosis not present

## 2023-01-15 DIAGNOSIS — G7000 Myasthenia gravis without (acute) exacerbation: Secondary | ICD-10-CM | POA: Diagnosis not present

## 2023-01-15 DIAGNOSIS — Z7985 Long-term (current) use of injectable non-insulin antidiabetic drugs: Secondary | ICD-10-CM

## 2023-01-15 DIAGNOSIS — I10 Essential (primary) hypertension: Secondary | ICD-10-CM

## 2023-01-15 DIAGNOSIS — E785 Hyperlipidemia, unspecified: Secondary | ICD-10-CM | POA: Diagnosis not present

## 2023-01-15 DIAGNOSIS — E162 Hypoglycemia, unspecified: Secondary | ICD-10-CM

## 2023-01-15 MED ORDER — METFORMIN HCL 1000 MG PO TABS: 1000.0000 mg | ORAL_TABLET | Freq: Two times a day (BID) | ORAL | 1 refills | Status: DC

## 2023-01-15 MED ORDER — SEMAGLUTIDE (1 MG/DOSE) 4 MG/3ML ~~LOC~~ SOPN
1.0000 mg | PEN_INJECTOR | SUBCUTANEOUS | 1 refills | Status: DC
Start: 2023-01-15 — End: 2023-06-23

## 2023-01-15 MED ORDER — DEXCOM G7 RECEIVER DEVI
0 refills | Status: DC
Start: 2023-01-15 — End: 2023-01-16

## 2023-01-15 NOTE — Assessment & Plan Note (Addendum)
Lab Results  Component Value Date   HGBA1C 5.7 (H) 09/17/2022   Well-controlled Continue Ozempic 1 mg qw as she has been taking it, although would be cautious with weight loss Since she is on oral prednisone now, restart metformin 1000 mg twice daily Needs CGM to monitor for hypoglycemia Advised to follow diabetic diet On ARB and statin Diabetic eye exam: Advised to follow up with Ophthalmology for diabetic eye exam

## 2023-01-15 NOTE — Assessment & Plan Note (Signed)
On Gabapentin 100 mg QD Still has numbness at times, patient prefers to stay on same dose for now. 

## 2023-01-15 NOTE — Progress Notes (Signed)
Established Patient Office Visit  Subjective:  Patient ID: Jessica Mclean, female    DOB: 1945-07-27  Age: 77 y.o. MRN: [id number]  CC:  Chief Complaint  Patient presents with   Follow-up    For DM and HTN    HPI Jessica Mclean is a 77 y.o. female with past medical history of HTN, DM2 on insulin, TIA, HLD and cervical ca. s/p radiation who presents for f/u of her chronic medical conditions.   Type II DM: She has stopped taking Metformin, but apparently has continued taking Ozempic. Of note, she admits that she has only 1 meal in a day. She has the Dexcom G7, but has difficulty connecting it to her phone.  She had normal nursing visit to help her connect it to her phone and had taught it to her as well, but still has difficulty connecting with it and checking blood glucose with it.  She does not not have a Armed forces logistics/support/administrative officer.  She has not checked blood glucose with regular fingerstick recently.  Of note, she has been taking prednisone 20 mg daily for myasthenia gravis.  HTN: BP is well-controlled. Takes medications regularly. Patient denies headache, dizziness, chest pain, dyspnea or palpitations.  Myasthenia gravis: She has completed IV Ig infusions and is on Mestinon and oral prednisone currently.  She reports improvement in extreme fatigue, which was worse as the day progressed.  She also has noticed improvement in visual disturbance and speech difficulty. She has history of CVA in the past.  Denies any new focal neurologic deficit. She has chronic weakness of the b/l LE.  Chronic cystitis: She has been taking Pyridium for dysuria. Denies any hematuria, flank or pelvic pain currently.     Past Medical History:  Diagnosis Date   Bleeding disorder (HCC)    Cervical cancer (HCC) 2010   Diabetes mellitus without complication (HCC)    type 2   Diabetes mellitus without complication (HCC)    Heart murmur    High cholesterol    HLD (hyperlipidemia)    Hypertension    Mental disorder     depression; suicidal ideation in 2017   Sleep apnea    Vitamin D deficiency     Past Surgical History:  Procedure Laterality Date   BILATERAL OOPHORECTOMY     CESAREAN SECTION     x 4   CHOLECYSTECTOMY     COLOSTOMY     x2   CYSTOSCOPY WITH URETHRAL DILATATION N/A 11/15/2020   Procedure: CYSTOSCOPY WITH URETHRAL DILATION;  Surgeon: Malen Gauze, MD;  Location: [facility];  Service: Urology;  Laterality: N/A;   SALPINGECTOMY     TONSILLECTOMY     TONSILLECTOMY AND ADENOIDECTOMY      Family History  Problem Relation Age of Onset   Cancer Father        kidney   Cancer Mother        lung   Other Brother        heart issues    Social History   Socioeconomic History   Marital status: Divorced    Spouse name: Not on file   Number of children: 3   Years of education: Not on file   Highest education level: Not on file  Occupational History   Occupation: retired  Tobacco Use   Smoking status: Never   Smokeless tobacco: Never  Vaping Use   Vaping status: Never Used  Substance and Sexual Activity   Alcohol use: Never   Drug use: Never  Sexual activity: Not Currently    Birth control/protection: Post-menopausal  Other Topics Concern   Not on file  Social History Narrative   ** Merged History Encounter **    Right handed   Leave alone-1 level home   Caffeine--soda/coffee       Are you currently employed ?    What is your current occupation? retired   Do you live at home alone?yes   Who lives with you?           Social Determinants of Health   Financial Resource Strain: Medium Risk (07/31/2020)   Overall Financial Resource Strain (CARDIA)    Difficulty of Paying Living Expenses: Somewhat hard  Food Insecurity: Food Insecurity Present (07/31/2020)   Hunger Vital Sign    Worried About Running Out of Food in the Last Year: Sometimes true    Ran Out of Food in the Last Year: Sometimes true  Transportation Needs: No Transportation Needs (07/31/2020)   PRAPARE  - Administrator, Civil Service (Medical): No    Lack of Transportation (Non-Medical): No  Physical Activity: Insufficiently Active (07/31/2020)   Exercise Vital Sign    Days of Exercise per Week: 4 days    Minutes of Exercise per Session: 30 min  Stress: No Stress Concern Present (07/31/2020)   [employer] of Occupational Health - Occupational Stress Questionnaire    Feeling of Stress : Only a little  Social Connections: Unknown (10/01/2021)   Received from [facility], [facility]   Social Network    Social Network: Not on file  Intimate Partner Violence: Unknown (08/22/2021)   Received from [facility], [facility]   HITS    Physically Hurt: Not on file    Insult or Talk Down To: Not on file    Threaten Physical Harm: Not on file    Scream or Curse: Not on file    Outpatient Medications Prior to Visit  Medication Sig Dispense Refill   ACCU-CHEK GUIDE test strip USE TO CHECK BLOOD SUGAR 4 TIMES DAILY 400 strip 2   Accu-Chek Softclix Lancets lancets USE TO CHECK BLOOD SUGAR 4 TIMES DAILY 400 each 2   albuterol (VENTOLIN HFA) 108 (90 Base) MCG/ACT inhaler Inhale 2 puffs into the lungs every 6 (six) hours as needed for wheezing or shortness of breath. 8 g 0   amLODipine (NORVASC) 10 MG tablet TAKE 1 TABLET BY MOUTH DAILY 100 tablet 2   aspirin-acetaminophen-caffeine (EXCEDRIN EXTRA STRENGTH) 250-250-65 MG tablet Take 2 tablets by mouth in the morning.     atorvastatin (LIPITOR) 80 MG tablet TAKE 1 TABLET BY MOUTH DAILY 100 tablet 2   azaTHIOprine (IMURAN) 50 MG tablet Take 2 tablets (100 mg total) by mouth daily. Take 1 tablet (50 mg) for 1 week, then increase to 2 tablets (100 mg) thereafter. 60 tablet 11   betamethasone dipropionate 0.05 % cream Apply topically 2 (two) times daily. 45 g 0   blood glucose meter kit and supplies Dispense based on patient and insurance preference. Use up to four times daily as directed. (FOR ICD-10 E10.9, E11.9). 1 each 0   Blood  Glucose Monitoring Suppl (ACCU-CHEK GUIDE) w/Device KIT      calcium-vitamin D (OSCAL-500) 500-400 MG-UNIT tablet Take 1 tablet by mouth daily.     cephALEXin (KEFLEX) 500 MG capsule Take 1 capsule (500 mg total) by mouth 3 (three) times daily. 30 capsule 0   Continuous Glucose Sensor (DEXCOM G7 SENSOR) MISC Use it to check blood  glucose 3 times before meals, at bedtime and as needed. 3 each 5   desonide (DESOWEN) 0.05 % ointment APPLY TOPICALLY TO THE AFFECTED AREA TWICE DAILY 60 g 0   gabapentin (NEURONTIN) 100 MG capsule Take 1 capsule (100 mg total) by mouth at bedtime. 90 capsule 3   meclizine (ANTIVERT) 25 MG tablet Take 1 tablet (25 mg total) by mouth 3 (three) times daily as needed for dizziness. 30 tablet 1   Multiple Vitamins-Minerals (PRESERVISION AREDS PO) Take 2 capsules by mouth daily.     omega-3 acid ethyl esters (LOVAZA) 1 g capsule Take 2 g by mouth 2 (two) times daily.     phenazopyridine (PYRIDIUM) 100 MG tablet Take 1 tablet (100 mg total) by mouth 3 (three) times daily as needed for pain (Dysuria). 90 tablet 5   polyethylene glycol powder (GLYCOLAX/MIRALAX) 17 GM/SCOOP powder Take 17 g by mouth daily as needed for mild constipation or moderate constipation.      predniSONE (DELTASONE) 10 MG tablet Take 1 tablet (10 mg total) by mouth daily with breakfast. Take 1 tablets (10 mg) daily for 1 week, then increase to 2 tablets (20 mg) daily thereafter 30 tablet 5   pyridostigmine (MESTINON) 60 MG tablet Take 1 tablet (60 mg total) by mouth 3 (three) times daily. 90 tablet 5   tamsulosin (FLOMAX) 0.4 MG CAPS capsule Take 1 capsule (0.4 mg total) by mouth daily after supper. 30 capsule 11   telmisartan (MICARDIS) 40 MG tablet TAKE 1 TABLET BY MOUTH DAILY 100 tablet 2   Continuous Blood Gluc Receiver (DEXCOM G7 RECEIVER) DEVI Use it to check blood glucose 3 times before meals, at bedtime and as needed. 1 each 0   No facility-administered medications prior to visit.    Allergies   Allergen Reactions   Other     Nickel staples   Bactrim [Sulfamethoxazole-Trimethoprim]     Hypoglycemia   Poison Ivy Treatments     Poison Ivy    Clindamycin Phosphate Nausea And Vomiting    ROS Review of Systems  Constitutional:  Positive for fatigue. Negative for chills and fever.  HENT:  Positive for voice change. Negative for congestion, sinus pressure, sinus pain and sore throat.   Eyes:  Negative for pain and discharge.  Respiratory:  Negative for cough and shortness of breath.   Cardiovascular:  Negative for chest pain and palpitations.  Gastrointestinal:  Negative for blood in stool, nausea and vomiting.  Endocrine: Negative for polydipsia and polyuria.  Genitourinary:  Positive for urgency. Negative for dysuria, flank pain, frequency and hematuria.  Musculoskeletal:  Negative for neck pain and neck stiffness.  Skin:  Negative for rash.  Neurological:  Positive for speech difficulty. Negative for dizziness and weakness.  Psychiatric/Behavioral:  Negative for agitation and behavioral problems.       Objective:    Physical Exam Vitals reviewed.  Constitutional:      General: She is not in acute distress.    Appearance: She is not diaphoretic.  HENT:     Head: Normocephalic and atraumatic.     Nose: Nose normal.     Mouth/Throat:     Mouth: Mucous membranes are moist.  Eyes:     General: No scleral icterus.    Extraocular Movements: Extraocular movements intact.  Cardiovascular:     Rate and Rhythm: Normal rate and regular rhythm.     Pulses: Normal pulses.     Heart sounds: Normal heart sounds. No murmur heard. Pulmonary:  Breath sounds: No wheezing or rales.  Musculoskeletal:     Cervical back: Neck supple. No tenderness.     Right lower leg: No edema.     Left lower leg: No edema.  Skin:    General: Skin is warm.     Findings: No rash.  Neurological:     General: No focal deficit present.     Mental Status: She is alert and oriented to person,  place, and time.     Sensory: Sensory deficit (B/l feet) present.     Motor: No weakness.     Comments: Slurred speech  Psychiatric:        Mood and Affect: Mood normal.        Behavior: Behavior normal.     BP 120/61   Pulse 68   Ht 5' 4.5" (1.638 m)   Wt 123 lb 6.4 oz (56 kg)   LMP  (LMP Unknown)   SpO2 97%   BMI 20.85 kg/m  Wt Readings from Last 3 Encounters:  01/15/23 123 lb 6.4 oz (56 kg)  12/26/22 129 lb (58.5 kg)  11/19/22 129 lb (58.5 kg)    Lab Results  Component Value Date   TSH 0.56 11/19/2022   Lab Results  Component Value Date   WBC 5.1 09/17/2022   HGB 9.4 (L) 09/17/2022   HCT 29.9 (L) 09/17/2022   MCV 85 09/17/2022   PLT 282 09/17/2022   Lab Results  Component Value Date   NA 141 09/17/2022   K 4.0 09/17/2022   CO2 23 09/17/2022   GLUCOSE 93 09/17/2022   BUN 11 09/17/2022   CREATININE 0.81 09/17/2022   BILITOT 0.4 09/17/2022   ALKPHOS 78 09/17/2022   AST 23 09/17/2022   ALT 18 09/17/2022   PROT 6.8 09/17/2022   ALBUMIN 4.3 09/17/2022   CALCIUM 9.9 09/17/2022   ANIONGAP 11 03/23/2021   EGFR 75 09/17/2022   Lab Results  Component Value Date   CHOL 185 09/17/2022   Lab Results  Component Value Date   HDL 52 09/17/2022   Lab Results  Component Value Date   LDLCALC 105 (H) 09/17/2022   Lab Results  Component Value Date   TRIG 159 (H) 09/17/2022   Lab Results  Component Value Date   CHOLHDL 3.6 09/17/2022   Lab Results  Component Value Date   HGBA1C 5.7 (H) 09/17/2022      Assessment & Plan:   Problem List Items Addressed This Visit       Cardiovascular and Mediastinum   Essential hypertension - Primary    BP Readings from Last 1 Encounters:  01/15/23 120/61   Well-controlled with telmisartan 40 mg daily and amlodipine 10 mg daily now Counseled for compliance with the medications Advised DASH diet and moderate exercise/walking as tolerated        Endocrine   Type 2 diabetes mellitus with hyperlipidemia (HCC)     Lab Results  Component Value Date   HGBA1C 5.7 (H) 09/17/2022   Well-controlled Continue Ozempic 1 mg qw as she has been taking it, although would be cautious with weight loss Since she is on oral prednisone now, restart metformin 1000 mg twice daily Needs CGM to monitor for hypoglycemia Advised to follow diabetic diet On ARB and statin Diabetic eye exam: Advised to follow up with Ophthalmology for diabetic eye exam      Relevant Medications   metFORMIN (GLUCOPHAGE) 1000 MG tablet   Semaglutide, 1 MG/DOSE, 4 MG/3ML SOPN   Continuous  Glucose Receiver (DEXCOM G7 RECEIVER) DEVI   Other Relevant Orders   CMP14+EGFR   Hemoglobin A1c   Diabetic neuropathy (HCC)    On Gabapentin 100 mg QD Still has numbness at times, patient prefers to stay on same dose for now.      Relevant Medications   metFORMIN (GLUCOPHAGE) 1000 MG tablet   Semaglutide, 1 MG/DOSE, 4 MG/3ML SOPN   Hypoglycemia   Relevant Medications   Continuous Glucose Receiver (DEXCOM G7 RECEIVER) DEVI     Nervous and Auditory   Myasthenia gravis without acute exacerbation (HCC)    Followed by neurology Has started Mestinon and oral prednisone - symptoms have improved now      Other Visit Diagnoses     Encounter for immunization       Relevant Orders   Flu Vaccine Trivalent High Dose (Fluad) (Completed)        Meds ordered this encounter  Medications   metFORMIN (GLUCOPHAGE) 1000 MG tablet    Sig: Take 1 tablet (1,000 mg total) by mouth 2 (two) times daily with a meal.    Dispense:  180 tablet    Refill:  1   Semaglutide, 1 MG/DOSE, 4 MG/3ML SOPN    Sig: Inject 1 mg as directed once a week.    Dispense:  9 mL    Refill:  1   Continuous Glucose Receiver (DEXCOM G7 RECEIVER) DEVI    Sig: Use it to check blood glucose 3 times before meals, at bedtime and as needed.    Dispense:  1 each    Refill:  0    Follow-up: Return in about 4 months (around 05/17/2023) for DM and HTN.    Anabel Halon, MD

## 2023-01-15 NOTE — Assessment & Plan Note (Signed)
BP Readings from Last 1 Encounters:  01/15/23 120/61   Well-controlled with telmisartan 40 mg daily and amlodipine 10 mg daily now Counseled for compliance with the medications Advised DASH diet and moderate exercise/walking as tolerated

## 2023-01-15 NOTE — Patient Instructions (Addendum)
Please start taking Metformin 1000 mg twice daily.  Please continue taking Ozempic 1 mg once weekly.  Please continue to take other medications as prescribed.  Please continue to follow low carb diet and perform moderate exercise/walking at least 150 mins/week.

## 2023-01-15 NOTE — Assessment & Plan Note (Signed)
Followed by neurology Has started Mestinon and oral prednisone - symptoms have improved now

## 2023-01-16 ENCOUNTER — Other Ambulatory Visit: Payer: Self-pay

## 2023-01-16 ENCOUNTER — Telehealth: Payer: Self-pay | Admitting: Internal Medicine

## 2023-01-16 DIAGNOSIS — E162 Hypoglycemia, unspecified: Secondary | ICD-10-CM

## 2023-01-16 DIAGNOSIS — E1169 Type 2 diabetes mellitus with other specified complication: Secondary | ICD-10-CM

## 2023-01-16 LAB — CMP14+EGFR
ALT: 14 IU/L (ref 0–32)
AST: 15 IU/L (ref 0–40)
Albumin: 4.5 g/dL (ref 3.8–4.8)
Alkaline Phosphatase: 59 IU/L (ref 44–121)
BUN/Creatinine Ratio: 23 (ref 12–28)
BUN: 29 mg/dL — ABNORMAL HIGH (ref 8–27)
Bilirubin Total: 0.8 mg/dL (ref 0.0–1.2)
CO2: 22 mmol/L (ref 20–29)
Calcium: 10.4 mg/dL — ABNORMAL HIGH (ref 8.7–10.3)
Chloride: 99 mmol/L (ref 96–106)
Creatinine, Ser: 1.26 mg/dL — ABNORMAL HIGH (ref 0.57–1.00)
Globulin, Total: 2.7 g/dL (ref 1.5–4.5)
Glucose: 179 mg/dL — ABNORMAL HIGH (ref 70–99)
Potassium: 4.6 mmol/L (ref 3.5–5.2)
Sodium: 137 mmol/L (ref 134–144)
Total Protein: 7.2 g/dL (ref 6.0–8.5)
eGFR: 44 mL/min/{1.73_m2} — ABNORMAL LOW (ref >59)

## 2023-01-16 LAB — HEMOGLOBIN A1C
Est. average glucose Bld gHb Est-mCnc: 163 mg/dL
Hgb A1c MFr Bld: 7.3 % — ABNORMAL HIGH (ref 4.8–5.6)

## 2023-01-16 MED ORDER — DEXCOM G7 RECEIVER DEVI: 0 refills | Status: DC

## 2023-01-16 MED ORDER — DEXCOM G7 SENSOR MISC
5 refills | Status: DC
Start: 2023-01-16 — End: 2023-02-26

## 2023-01-16 NOTE — Telephone Encounter (Signed)
Pt aware of results 

## 2023-01-16 NOTE — Telephone Encounter (Signed)
Patient returning call for lab results. 

## 2023-01-19 DIAGNOSIS — G7000 Myasthenia gravis without (acute) exacerbation: Secondary | ICD-10-CM | POA: Diagnosis not present

## 2023-01-20 ENCOUNTER — Telehealth: Payer: Self-pay | Admitting: Internal Medicine

## 2023-01-20 NOTE — Telephone Encounter (Signed)
Patient called about lab work saying not good needs to speak to nurse or Dr Allena Katz about her kidney being way off. Also discuss her medications. Call back # [phone].

## 2023-01-20 NOTE — Telephone Encounter (Signed)
Patient states that she has been informed that she is taking 3 medication that are bad for Kidneys , wants a call back in regard to discuss

## 2023-01-20 NOTE — Telephone Encounter (Signed)
Pt also having issues with Dexcom

## 2023-01-21 NOTE — Telephone Encounter (Signed)
lmtrc

## 2023-01-23 NOTE — Telephone Encounter (Signed)
Pt LVM says she has been waiting on parts for Dexcom

## 2023-01-26 NOTE — Telephone Encounter (Signed)
Patient needs to speak to pharmacy

## 2023-01-27 ENCOUNTER — Other Ambulatory Visit: Payer: Self-pay

## 2023-01-27 ENCOUNTER — Telehealth: Payer: Self-pay | Admitting: Internal Medicine

## 2023-01-27 DIAGNOSIS — E1169 Type 2 diabetes mellitus with other specified complication: Secondary | ICD-10-CM

## 2023-01-27 NOTE — Telephone Encounter (Signed)
Patient called in requesting referral to diabetes specialist. States she is having a hard time with new continuous glucose meter and is wanting to see specialist in  regard   Patient wants a call back in regard.

## 2023-01-27 NOTE — Telephone Encounter (Signed)
Referral placed for patient.  

## 2023-01-28 NOTE — Telephone Encounter (Signed)
Patient calling to follow up on referral- please advise

## 2023-01-30 NOTE — Telephone Encounter (Signed)
Pt LVM says the diabetes doctor does not have availability until Dec. Please advise Thank you

## 2023-01-30 NOTE — Telephone Encounter (Signed)
Attempted to reach pt, no vm set up.

## 2023-02-02 NOTE — Telephone Encounter (Signed)
Patient called back to our office asking can anyone help her get in sooner than April 22, 2023. Any other suggestions.  Call back # [phone].

## 2023-02-02 NOTE — Telephone Encounter (Signed)
Pt LVM returning call

## 2023-02-06 ENCOUNTER — Other Ambulatory Visit: Payer: Self-pay | Admitting: Neurology

## 2023-02-06 ENCOUNTER — Other Ambulatory Visit: Payer: Self-pay | Admitting: Internal Medicine

## 2023-02-06 DIAGNOSIS — E1169 Type 2 diabetes mellitus with other specified complication: Secondary | ICD-10-CM

## 2023-02-06 DIAGNOSIS — G7000 Myasthenia gravis without (acute) exacerbation: Secondary | ICD-10-CM

## 2023-02-06 DIAGNOSIS — I10 Essential (primary) hypertension: Secondary | ICD-10-CM

## 2023-02-06 DIAGNOSIS — E162 Hypoglycemia, unspecified: Secondary | ICD-10-CM

## 2023-02-09 ENCOUNTER — Other Ambulatory Visit: Payer: Self-pay

## 2023-02-09 DIAGNOSIS — E785 Hyperlipidemia, unspecified: Secondary | ICD-10-CM

## 2023-02-09 DIAGNOSIS — E162 Hypoglycemia, unspecified: Secondary | ICD-10-CM

## 2023-02-09 MED ORDER — DEXCOM G7 RECEIVER DEVI: 0 refills | Status: DC

## 2023-02-12 ENCOUNTER — Telehealth: Payer: Self-pay | Admitting: Internal Medicine

## 2023-02-12 NOTE — Telephone Encounter (Signed)
Mailed to patient

## 2023-02-12 NOTE — Telephone Encounter (Signed)
Patient called needs updated list of medication she likes to keep track of her medicine. The last one she has is dated 08.29.2024, she needs a new updated mailed to her.

## 2023-02-18 DIAGNOSIS — G7000 Myasthenia gravis without (acute) exacerbation: Secondary | ICD-10-CM | POA: Diagnosis not present

## 2023-02-25 ENCOUNTER — Other Ambulatory Visit: Payer: Self-pay | Admitting: Internal Medicine

## 2023-02-25 DIAGNOSIS — E1169 Type 2 diabetes mellitus with other specified complication: Secondary | ICD-10-CM

## 2023-02-26 ENCOUNTER — Other Ambulatory Visit: Payer: Self-pay | Admitting: Internal Medicine

## 2023-02-26 DIAGNOSIS — E1149 Type 2 diabetes mellitus with other diabetic neurological complication: Secondary | ICD-10-CM

## 2023-03-02 ENCOUNTER — Other Ambulatory Visit: Payer: Self-pay | Admitting: Internal Medicine

## 2023-03-02 DIAGNOSIS — B009 Herpesviral infection, unspecified: Secondary | ICD-10-CM | POA: Insufficient documentation

## 2023-03-02 MED ORDER — VALACYCLOVIR HCL 1 G PO TABS
1000.0000 mg | ORAL_TABLET | Freq: Two times a day (BID) | ORAL | 0 refills | Status: AC
Start: 2023-03-02 — End: 2023-03-12

## 2023-03-21 ENCOUNTER — Other Ambulatory Visit: Payer: Self-pay | Admitting: Internal Medicine

## 2023-03-21 DIAGNOSIS — E1169 Type 2 diabetes mellitus with other specified complication: Secondary | ICD-10-CM

## 2023-03-24 DIAGNOSIS — G7000 Myasthenia gravis without (acute) exacerbation: Secondary | ICD-10-CM | POA: Diagnosis not present

## 2023-03-27 DIAGNOSIS — G7000 Myasthenia gravis without (acute) exacerbation: Secondary | ICD-10-CM | POA: Diagnosis not present

## 2023-04-01 NOTE — Progress Notes (Deleted)
I saw Jessica Mclean in neurology clinic on 04/09/23 in follow up for AChR ab positive, generalized myasthenia gravis.  HPI: Jessica Mclean is a 77 y.o. year old female with a history of HTN, IDDM, HLD, cervical cancer s/p radiation, TIA, depression, vit D deficiency, sleep apnea who we last saw on 12/26/22 (video visit).  To briefly review: Initial consultation (11/06/22): Patient woke up over a year ago and had difficulty lifting her head and had vertigo. She called her daughter who is a doctor, who insisted patient go to the hospital. She also had dysarthria. This was first thing in the morning. She had the COVID vaccine the day before, so symptoms were attributed to that. The vertigo resolved. She continues to have falls. She is not sure why. She had an MRI finally in 09/2022 (about 1 month ago) that did not show evidence of a stroke. Over the last year, she has noticed symptoms are worse with fatigue, end of day, and with use. PCP checked AChR blocking ab and was elevated to 52.    Patient has stopped taking all of her medications due to concerns of what would make myasthenia worse.   Current MG symptoms: Ptosis: Yes, especially while watching TV Double vision: Yes Speech: Yes, worse when talking more and at end of day Chewing: Yes Swallowing: Yes, choking on solids. Liquids tend to dribble out of mouth. Breathing: Denies orthopnea Arm strength: No issues Leg strength: Does not think she is weak, but does endorse falls.   She is not on any medication for myasthenia gravis.   Of note patient takes gabapentin 100 mg qhs for diabetic neuropathy. This helps.   Patient is currently going to speech therapy. She is not sure she will continue due to costly co-pay.   She does not report any constitutional symptoms like fever, night sweats, anorexia or unintentional weight loss.   EtOH use: None  Restrictive diet? Vegan, not on B12. She has been eating much less lately, mostly bread and  tomatoes Family history of neuropathy/myopathy/neurologic disease? No   11/19/22: Patient has not gotten her blood work or CT chest. Patient has not been called to get CT chest per her report, but it appears [facility] has been trying to reach patient (per notes).   She got IVIg at home from 11/14/22-11/18/22. She had significant fatigue during the treatment.   Current MG symptoms: Ptosis: About the same as prior Double vision: About the same as prior Speech: Has been clearer Chewing: Better than prior Swallowing: A little improvement Breathing: Did get woken up with shortness of breath Arm strength: Feels weak, which she did not mention at last visit. Fatigues easily. Leg strength: Fatigues easily. Fell this morning.   Current medications:  -Prednisone 20 mg daily -Mestinon 60 mg TID S/p IVIg 0.4 g/kg/day for 5 days (11/14/22-11/18/22)   Side effects: None  12/26/22: AChR abs were again positive, but lab work was otherwise unremarkable, including TPMT activity. CT chest showed no evidence of thymoma. Patient thinks the IVIg is really helping. She take take pills and can swallow much better.   She received her monthly maintenance of IVIg on 12/14/22. She is tolerating this well. Her next dose 01/19/23.   Current MG symptoms: Ptosis: Still having, not sure if this has improved Double vision: Very occasional, much better Speech: Thinks this has improved, still has some slurred Chewing: Much improved Swallowing: Much improved Breathing: Denies orthopnea Arm strength: Has to take frequent breaks to keep strength or  will get weak Leg strength: Has to take frequent breaks as above   Current medications:  -Prednisone 20 mg daily (has only been taking once daily) -Mestinon 60 mg TID   Side effects: Occasional diarrhea  Most recent Assessment and Plan (12/26/22): This is Jessica Mclean, a 77 y.o. female with  AChR ab positive, generalized myasthenia gravis. CT chest showed no evidence  of thymoma. She is s/p IVIg from 11/14/22-11/17/21 and 12/14/22. Next dose is 01/19/23. She is improving but with residual dysarthria and fatigable weakness currently. After discussion, we will start Imuran today and use IVIg as a bridge to Imuran.    Plan: -Continue monthly IVIg (next dose 01/19/23) -Reduce Prednisone: 10 mg daily (has already been taking it this way) -Mestinon 60 mg TID -Start Imuran 50 mg daily for 1 week, then 100 mg daily thereafter (will target 2-3 mg/kg dosing) -Will need monthly CBC w/ diff and CMP for 3 months. Will discuss with [facility] who gives patient IVIg to see if they can draw as patient does not have reliable transportation.  Since their last visit: ***  Had IVIg on 01/19/23, 02/18/23***   Hospitalized on 04/08/23 for severe weakness, worsening dysphagia***  Current MG symptoms: Ptosis: *** Double vision: *** Speech: *** Chewing: *** Swallowing: *** Breathing: *** Arm strength: *** Leg strength: ***  Current medications: *** -Prednisone 10 mg daily -Mestinon 60 mg TID -Imuran 100 mg daily  Side effects: ***   ROS: Pertinent positive and negative systems reviewed in HPI. ***   MEDICATIONS:  Outpatient Encounter Medications as of 04/09/2023  Medication Sig   ACCU-CHEK GUIDE test strip USE TO CHECK BLOOD SUGAR 4 TIMES DAILY   Accu-Chek Softclix Lancets lancets USE TO CHECK BLOOD SUGAR 4 TIMES DAILY   albuterol (VENTOLIN HFA) 108 (90 Base) MCG/ACT inhaler Inhale 2 puffs into the lungs every 6 (six) hours as needed for wheezing or shortness of breath.   amLODipine (NORVASC) 10 MG tablet TAKE 1 TABLET BY MOUTH DAILY   aspirin-acetaminophen-caffeine (EXCEDRIN EXTRA STRENGTH) 250-250-65 MG tablet Take 2 tablets by mouth in the morning.   atorvastatin (LIPITOR) 80 MG tablet TAKE 1 TABLET BY MOUTH DAILY   azaTHIOprine (IMURAN) 50 MG tablet Take 2 tablets (100 mg total) by mouth daily. Take 1 tablet (50 mg) for 1 week, then increase to 2 tablets (100 mg)  thereafter.   betamethasone dipropionate 0.05 % cream Apply topically 2 (two) times daily.   blood glucose meter kit and supplies Dispense based on patient and insurance preference. Use up to four times daily as directed. (FOR ICD-10 E10.9, E11.9).   Blood Glucose Monitoring Suppl (ACCU-CHEK GUIDE) w/Device KIT    calcium-vitamin D (OSCAL-500) 500-400 MG-UNIT tablet Take 1 tablet by mouth daily.   cephALEXin (KEFLEX) 500 MG capsule Take 1 capsule (500 mg total) by mouth 3 (three) times daily.   Continuous Glucose Receiver (DEXCOM G7 RECEIVER) DEVI USE TO CHECK BLOOD GLUCOSE 3  TIMES DAILY BEFORE MEALS, AT  BEDTIME AND AS NEEDED   Continuous Glucose Sensor (DEXCOM G7 SENSOR) MISC USE TO CHECK BLOOD GLUCOSE 3  TIMES DAILY BEFORE MEALS, AT  BEDTIME AND AS NEEDED   desonide (DESOWEN) 0.05 % ointment APPLY TOPICALLY TO THE AFFECTED AREA TWICE DAILY   gabapentin (NEURONTIN) 100 MG capsule TAKE 1 CAPSULE BY MOUTH AT  BEDTIME   meclizine (ANTIVERT) 25 MG tablet Take 1 tablet (25 mg total) by mouth 3 (three) times daily as needed for dizziness.   metFORMIN (GLUCOPHAGE) 1000 MG tablet  TAKE 1 TABLET BY MOUTH TWICE  DAILY WITH MEALS   Multiple Vitamins-Minerals (PRESERVISION AREDS PO) Take 2 capsules by mouth daily.   omega-3 acid ethyl esters (LOVAZA) 1 g capsule Take 2 g by mouth 2 (two) times daily.   phenazopyridine (PYRIDIUM) 100 MG tablet Take 1 tablet (100 mg total) by mouth 3 (three) times daily as needed for pain (Dysuria).   polyethylene glycol powder (GLYCOLAX/MIRALAX) 17 GM/SCOOP powder Take 17 g by mouth daily as needed for mild constipation or moderate constipation.    predniSONE (DELTASONE) 10 MG tablet Take 1 tablet (10 mg total) by mouth daily with breakfast. Take 2 tablets (20 mg) daily   pyridostigmine (MESTINON) 60 MG tablet Take 1 tablet (60 mg total) by mouth 3 (three) times daily.   Semaglutide, 1 MG/DOSE, 4 MG/3ML SOPN Inject 1 mg as directed once a week.   tamsulosin (FLOMAX) 0.4 MG  CAPS capsule Take 1 capsule (0.4 mg total) by mouth daily after supper.   telmisartan (MICARDIS) 40 MG tablet TAKE 1 TABLET BY MOUTH DAILY   No facility-administered encounter medications on file as of 04/09/2023.    PAST MEDICAL HISTORY: Past Medical History:  Diagnosis Date   Bleeding disorder (HCC)    Cervical cancer (HCC) 2010   Diabetes mellitus without complication (HCC)    type 2   Diabetes mellitus without complication (HCC)    Heart murmur    High cholesterol    HLD (hyperlipidemia)    Hypertension    Mental disorder    depression; suicidal ideation in 2017   Sleep apnea    Vitamin D deficiency     PAST SURGICAL HISTORY: Past Surgical History:  Procedure Laterality Date   BILATERAL OOPHORECTOMY     CESAREAN SECTION     x 4   CHOLECYSTECTOMY     COLOSTOMY     x2   CYSTOSCOPY WITH URETHRAL DILATATION N/A 11/15/2020   Procedure: CYSTOSCOPY WITH URETHRAL DILATION;  Surgeon: Malen Gauze, MD;  Location: [facility];  Service: Urology;  Laterality: N/A;   SALPINGECTOMY     TONSILLECTOMY     TONSILLECTOMY AND ADENOIDECTOMY      ALLERGIES: Allergies  Allergen Reactions   Other     Nickel staples   Bactrim [Sulfamethoxazole-Trimethoprim]     Hypoglycemia   Poison Ivy Treatments     Poison Ivy    Clindamycin Phosphate Nausea And Vomiting    FAMILY HISTORY: Family History  Problem Relation Age of Onset   Cancer Father        kidney   Cancer Mother        lung   Other Brother        heart issues    SOCIAL HISTORY: Social History   Tobacco Use   Smoking status: Never   Smokeless tobacco: Never  Vaping Use   Vaping status: Never Used  Substance Use Topics   Alcohol use: Never   Drug use: Never   Social History   Social History Narrative   ** Merged History Encounter **    Right handed   Leave alone-1 level home   Caffeine--soda/coffee       Are you currently employed ?    What is your current occupation? retired   Do you live at home  alone?yes   Who lives with you?            Objective:  Vital Signs:  LMP  (LMP Unknown)   General:*** General appearance: Awake and  alert. No distress. Cooperative with exam.  Skin: No obvious rash or jaundice. HEENT: Atraumatic. Anicteric. Lungs: Non-labored breathing on room air  Heart: Regular Abdomen: Soft, non tender. Extremities: No edema. No obvious deformity.  Musculoskeletal: No obvious joint swelling.  Neurological: Mental Status: Alert. Speech fluent. No pseudobulbar affect Cranial Nerves: CNII: No RAPD. Visual fields intact. CNIII, IV, VI: PERRL. No nystagmus. EOMI. CN V: Facial sensation intact bilaterally to fine touch. [place] clench strong. Jaw jerk***. CN VII: Facial muscles symmetric and strong. No ptosis at rest or after sustained upgaze***. CN VIII: Hears finger rub well bilaterally. CN IX: No hypophonia. CN X: Palate elevates symmetrically. CN XI: Full strength shoulder shrug bilaterally. CN XII: Tongue protrusion full and midline. No atrophy or fasciculations. No significant dysarthria*** Motor: Tone is ***. *** fasciculations in *** extremities. *** atrophy. No grip or percussive myotonia.  Individual muscle group testing (MRC grade out of 5):  Movement     Neck flexion ***    Neck extension ***     Right Left   Shoulder abduction *** ***   Shoulder adduction *** ***   Shoulder ext rotation *** ***   Shoulder int rotation *** ***   Elbow flexion *** ***   Elbow extension *** ***   Wrist extension *** ***   Wrist flexion *** ***   Finger abduction - FDI *** ***   Finger abduction - ADM *** ***   Finger extension *** ***   Finger distal flexion - 2/3 *** ***   Finger distal flexion - 4/5 *** ***   Thumb flexion - FPL *** ***   Thumb abduction - APB *** ***    Hip flexion *** ***   Hip extension *** ***   Hip adduction *** ***   Hip abduction *** ***   Knee extension *** ***   Knee flexion *** ***   Dorsiflexion *** ***    Plantarflexion *** ***   Inversion *** ***   Eversion *** ***   Great toe extension *** ***   Great toe flexion *** ***     Reflexes:  Right Left  Bicep *** ***  Tricep *** ***  BrRad *** ***  Knee *** ***  Ankle *** ***   Pathological Reflexes: Babinski: *** response bilaterally*** Hoffman: *** Troemner: *** Pectoral: *** Palmomental: *** Facial: *** Midline tap: *** Sensation: Pinprick: *** Vibration: *** Temperature: *** Proprioception: *** Coordination: Intact finger-to- nose-finger and heel-to-shin bilaterally. Romberg negative.*** Gait: Able to rise from chair with arms crossed unassisted. Normal, narrow-based gait. Able to tandem walk. Able to walk on toes and heels.***   Lab and Test Review: New results: 01/15/23: HbA1c: 7.3 CMP significant for glucose 179, Cr 1.26  01/19/23: CMP significant for Cr 1.01, glucose 183 CBC significant for Hb 8.4 (previously 9.4), WBC and platelets wnl  Previously reviewed results: 11/23/22: B1 wnl TSH wnl TPMT: normal activity B12: 357 AChR binding ab: 2.05, modulating 81   09/17/22: AChR blocking abs: elevated to 52 HbA1c: 5.7 (high of 9.2 on 01/09/21) CMP unremarkable CBC w/ diff significant for Hb 9.4 (chronic) Lipid panel: Component     Latest Ref Rng 09/17/2022  Cholesterol, Total     100 - 199 mg/dL 161   Triglycerides     0 - 149 mg/dL 096 (H)   HDL Cholesterol     >39 mg/dL 52   VLDL Cholesterol Cal     5 - 40 mg/dL 28   LDL Chol Calc (NIH)  0 - 99 mg/dL 161 (H)   Total CHOL/HDL Ratio     0.0 - 4.4 ratio 3.6     TSH (04/15/22): 1.220 Vit D (04/15/22): 32.2   Imaging: MRI brain wo contrast (10/14/22): FINDINGS: Brain: Diffusion imaging does not show any acute or subacute infarction. No abnormality affects the brainstem or cerebellum. Incidental perivascular spaces in the right mid brain. Cerebral hemispheres show mild chronic small-vessel ischemic change of the deep and subcortical white matter.  No cortical or large vessel territory stroke. No mass lesion, hemorrhage, hydrocephalus or extra-axial collection.   Vascular: Major vessels at the base of the brain show flow.   Skull and upper cervical spine: Negative   Sinuses/Orbits: Clear/normal   Other: None   IMPRESSION: No acute finding. Mild chronic small-vessel ischemic change of the cerebral hemispheric white matter.  CT chest w contrast (12/03/22): FINDINGS: Cardiovascular: The cardiac size is normal. There is no pericardial effusion. There is three-vessel coronary artery calcification greatest in the proximal LAD.   There are mild scattered calcific plaques in the aorta. The great vessels are clear. There is no aortic aneurysm, dissection or stenosis. The pulmonary arteries and veins are normal caliber. The pulmonary arteries are centrally clear.   Mediastinum/Nodes: No enlarged mediastinal, hilar, or axillary lymph nodes. Thyroid gland, trachea, and esophagus demonstrate no significant findings.   Lungs/Pleura: Small right posterior diaphragmatic fat herniation. There is a calcified granuloma in the right lower lobe. The lungs hyperexpanded but clear of infiltrates and nodules. There is no pleural effusion, thickening or pneumothorax.   Upper Abdomen: The liver is mildly steatotic. Gallbladder is absent with prominent common bile duct measuring 12 mm most likely due to prior cholecystectomy, with mild central intrahepatic biliary prominence.   Laboratory and clinical correlation suggested. No acute upper abdominal findings. No chest wall mass is seen.   Musculoskeletal: Osteopenia with thoracic kyphosis and multilevel degenerative disc disease with spondylosis. No acute or other significant osseous findings. The ribcage is intact.   IMPRESSION: 1. No acute chest CT or significant mediastinal findings. 2. Aortic and coronary artery atherosclerosis. 3. Hyperinflated.  No focal infiltrates. 4. Mild  hepatic steatosis. 5. Prior cholecystectomy with prominent common bile duct and mild central intrahepatic biliary prominence. 6. Osteopenia and degenerative change.  ASSESSMENT: This is Jessica Mclean, a 77 y.o. female with AChR ab positive, generalized myasthenia gravis. CT chest showed no evidence of thymoma. She is s/p IVIg from 11/14/22-11/17/21 and 12/14/22, 01/19/23, 02/18/23***. Imuran was started 12/26/22.  Plan: ***CBC, CMP -Continue monthly IVIg (next dose 01/19/23) -Reduce Prednisone: 10 mg daily (has already been taking it this way) -Mestinon 60 mg TID -Imuran 100 mg daily (will target 2-3 mg/kg dosing)  Return to clinic in ***  Total time spent reviewing records, interview, history/exam, documentation, and coordination of care on day of encounter:  *** min  Jacquelyne Balint, MD

## 2023-04-08 ENCOUNTER — Encounter (HOSPITAL_COMMUNITY): Payer: Self-pay | Admitting: Emergency Medicine

## 2023-04-08 ENCOUNTER — Telehealth: Payer: Self-pay | Admitting: Neurology

## 2023-04-08 ENCOUNTER — Emergency Department (HOSPITAL_COMMUNITY): Payer: 59

## 2023-04-08 ENCOUNTER — Other Ambulatory Visit: Payer: Self-pay

## 2023-04-08 ENCOUNTER — Inpatient Hospital Stay (HOSPITAL_COMMUNITY): Payer: 59

## 2023-04-08 ENCOUNTER — Inpatient Hospital Stay (HOSPITAL_COMMUNITY)
Admission: EM | Admit: 2023-04-08 | Discharge: 2023-04-10 | DRG: 057 | Disposition: A | Discharge status: Home / Self Care | Payer: 59 | Attending: Internal Medicine | Admitting: Internal Medicine

## 2023-04-08 DIAGNOSIS — E559 Vitamin D deficiency, unspecified: Secondary | ICD-10-CM | POA: Diagnosis present

## 2023-04-08 DIAGNOSIS — I499 Cardiac arrhythmia, unspecified: Secondary | ICD-10-CM | POA: Diagnosis not present

## 2023-04-08 DIAGNOSIS — Z8249 Family history of ischemic heart disease and other diseases of the circulatory system: Secondary | ICD-10-CM

## 2023-04-08 DIAGNOSIS — Z91048 Other nonmedicinal substance allergy status: Secondary | ICD-10-CM

## 2023-04-08 DIAGNOSIS — Z8673 Personal history of transient ischemic attack (TIA), and cerebral infarction without residual deficits: Secondary | ICD-10-CM

## 2023-04-08 DIAGNOSIS — R739 Hyperglycemia, unspecified: Secondary | ICD-10-CM | POA: Diagnosis not present

## 2023-04-08 DIAGNOSIS — M4802 Spinal stenosis, cervical region: Secondary | ICD-10-CM | POA: Diagnosis not present

## 2023-04-08 DIAGNOSIS — E1169 Type 2 diabetes mellitus with other specified complication: Secondary | ICD-10-CM | POA: Diagnosis present

## 2023-04-08 DIAGNOSIS — R54 Age-related physical debility: Secondary | ICD-10-CM | POA: Diagnosis present

## 2023-04-08 DIAGNOSIS — Z923 Personal history of irradiation: Secondary | ICD-10-CM

## 2023-04-08 DIAGNOSIS — G4733 Obstructive sleep apnea (adult) (pediatric): Secondary | ICD-10-CM | POA: Diagnosis present

## 2023-04-08 DIAGNOSIS — R296 Repeated falls: Secondary | ICD-10-CM | POA: Diagnosis not present

## 2023-04-08 DIAGNOSIS — E1140 Type 2 diabetes mellitus with diabetic neuropathy, unspecified: Secondary | ICD-10-CM | POA: Diagnosis present

## 2023-04-08 DIAGNOSIS — F32A Depression, unspecified: Secondary | ICD-10-CM | POA: Diagnosis present

## 2023-04-08 DIAGNOSIS — G7001 Myasthenia gravis with (acute) exacerbation: Principal | ICD-10-CM

## 2023-04-08 DIAGNOSIS — Z8051 Family history of malignant neoplasm of kidney: Secondary | ICD-10-CM

## 2023-04-08 DIAGNOSIS — Z79899 Other long term (current) drug therapy: Secondary | ICD-10-CM | POA: Diagnosis not present

## 2023-04-08 DIAGNOSIS — G7000 Myasthenia gravis without (acute) exacerbation: Secondary | ICD-10-CM | POA: Diagnosis not present

## 2023-04-08 DIAGNOSIS — R55 Syncope and collapse: Secondary | ICD-10-CM | POA: Insufficient documentation

## 2023-04-08 DIAGNOSIS — R531 Weakness: Principal | ICD-10-CM

## 2023-04-08 DIAGNOSIS — M47812 Spondylosis without myelopathy or radiculopathy, cervical region: Secondary | ICD-10-CM | POA: Diagnosis not present

## 2023-04-08 DIAGNOSIS — I4510 Unspecified right bundle-branch block: Secondary | ICD-10-CM | POA: Diagnosis present

## 2023-04-08 DIAGNOSIS — Z7985 Long-term (current) use of injectable non-insulin antidiabetic drugs: Secondary | ICD-10-CM

## 2023-04-08 DIAGNOSIS — Z7984 Long term (current) use of oral hypoglycemic drugs: Secondary | ICD-10-CM | POA: Diagnosis not present

## 2023-04-08 DIAGNOSIS — G609 Hereditary and idiopathic neuropathy, unspecified: Secondary | ICD-10-CM | POA: Diagnosis present

## 2023-04-08 DIAGNOSIS — R1310 Dysphagia, unspecified: Secondary | ICD-10-CM | POA: Diagnosis present

## 2023-04-08 DIAGNOSIS — Z79624 Long term (current) use of inhibitors of nucleotide synthesis: Secondary | ICD-10-CM

## 2023-04-08 DIAGNOSIS — Z743 Need for continuous supervision: Secondary | ICD-10-CM | POA: Diagnosis not present

## 2023-04-08 DIAGNOSIS — E785 Hyperlipidemia, unspecified: Secondary | ICD-10-CM | POA: Diagnosis not present

## 2023-04-08 DIAGNOSIS — N3020 Other chronic cystitis without hematuria: Secondary | ICD-10-CM | POA: Diagnosis present

## 2023-04-08 DIAGNOSIS — M50223 Other cervical disc displacement at C6-C7 level: Secondary | ICD-10-CM | POA: Diagnosis not present

## 2023-04-08 DIAGNOSIS — E7800 Pure hypercholesterolemia, unspecified: Secondary | ICD-10-CM | POA: Diagnosis present

## 2023-04-08 DIAGNOSIS — Z8541 Personal history of malignant neoplasm of cervix uteri: Secondary | ICD-10-CM

## 2023-04-08 DIAGNOSIS — E538 Deficiency of other specified B group vitamins: Secondary | ICD-10-CM | POA: Diagnosis not present

## 2023-04-08 DIAGNOSIS — I10 Essential (primary) hypertension: Secondary | ICD-10-CM | POA: Diagnosis not present

## 2023-04-08 DIAGNOSIS — Z801 Family history of malignant neoplasm of trachea, bronchus and lung: Secondary | ICD-10-CM | POA: Diagnosis not present

## 2023-04-08 DIAGNOSIS — R0689 Other abnormalities of breathing: Secondary | ICD-10-CM | POA: Diagnosis not present

## 2023-04-08 DIAGNOSIS — E1149 Type 2 diabetes mellitus with other diabetic neurological complication: Secondary | ICD-10-CM | POA: Diagnosis not present

## 2023-04-08 DIAGNOSIS — R29818 Other symptoms and signs involving the nervous system: Secondary | ICD-10-CM | POA: Diagnosis not present

## 2023-04-08 LAB — CBC WITH DIFFERENTIAL/PLATELET
Abs Immature Granulocytes: 0.02 10*3/uL (ref 0.00–0.07)
Basophils Absolute: 0 10*3/uL (ref 0.0–0.1)
Basophils Relative: 1 %
Eosinophils Absolute: 0 10*3/uL (ref 0.0–0.5)
Eosinophils Relative: 0 %
HCT: 36.1 % (ref 36.0–46.0)
Hemoglobin: 11.4 g/dL — ABNORMAL LOW (ref 12.0–15.0)
Immature Granulocytes: 0 %
Lymphocytes Relative: 9 %
Lymphs Abs: 0.5 10*3/uL — ABNORMAL LOW (ref 0.7–4.0)
MCH: 27.7 pg (ref 26.0–34.0)
MCHC: 31.6 g/dL (ref 30.0–36.0)
MCV: 87.6 fL (ref 80.0–100.0)
Monocytes Absolute: 0.2 10*3/uL (ref 0.1–1.0)
Monocytes Relative: 4 %
Neutro Abs: 5.3 10*3/uL (ref 1.7–7.7)
Neutrophils Relative %: 86 %
Platelets: 315 10*3/uL (ref 150–400)
RBC: 4.12 MIL/uL (ref 3.87–5.11)
RDW: 16.4 % — ABNORMAL HIGH (ref 11.5–15.5)
WBC: 6.1 10*3/uL (ref 4.0–10.5)
nRBC: 0 % (ref 0.0–0.2)

## 2023-04-08 LAB — COMPREHENSIVE METABOLIC PANEL
ALT: 12 U/L (ref 0–44)
AST: 20 U/L (ref 15–41)
Albumin: 4.3 g/dL (ref 3.5–5.0)
Alkaline Phosphatase: 52 U/L (ref 38–126)
Anion gap: 12 (ref 5–15)
BUN: 26 mg/dL — ABNORMAL HIGH (ref 8–23)
CO2: 19 mmol/L — ABNORMAL LOW (ref 22–32)
Calcium: 9.8 mg/dL (ref 8.9–10.3)
Chloride: 104 mmol/L (ref 98–111)
Creatinine, Ser: 1 mg/dL (ref 0.44–1.00)
GFR, Estimated: 58 mL/min — ABNORMAL LOW (ref >60)
Glucose, Bld: 217 mg/dL — ABNORMAL HIGH (ref 70–99)
Potassium: 3.8 mmol/L (ref 3.5–5.1)
Sodium: 135 mmol/L (ref 135–145)
Total Bilirubin: 0.7 mg/dL (ref <1.2)
Total Protein: 8.4 g/dL — ABNORMAL HIGH (ref 6.5–8.1)

## 2023-04-08 LAB — TSH: TSH: 0.484 u[IU]/mL (ref 0.350–4.500)

## 2023-04-08 LAB — TROPONIN I (HIGH SENSITIVITY): Troponin I (High Sensitivity): 5 ng/L (ref <18)

## 2023-04-08 MED ORDER — OYSTER SHELL CALCIUM/D3 500-5 MG-MCG PO TABS
1.0000 | ORAL_TABLET | Freq: Every day | ORAL | Status: DC
  Administered 2023-04-09 – 2023-04-10 (×2): 1 via ORAL
  Filled 2023-04-08 (×3): qty 1

## 2023-04-08 MED ORDER — PROSIGHT PO TABS
1.0000 | ORAL_TABLET | Freq: Every day | ORAL | Status: DC
  Administered 2023-04-09 – 2023-04-10 (×2): 1 via ORAL
  Filled 2023-04-08 (×4): qty 1

## 2023-04-08 MED ORDER — PRESERVISION AREDS PO CAPS: ORAL_CAPSULE | Freq: Every day | ORAL | Status: DC

## 2023-04-08 MED ORDER — POLYETHYLENE GLYCOL 3350 17 G PO PACK: 17.0000 g | PACK | Freq: Every day | ORAL | Status: DC | PRN

## 2023-04-08 MED ORDER — ALBUTEROL SULFATE HFA 108 (90 BASE) MCG/ACT IN AERS: 2.0000 | INHALATION_SPRAY | Freq: Four times a day (QID) | RESPIRATORY_TRACT | Status: DC | PRN

## 2023-04-08 MED ORDER — INSULIN ASPART 100 UNIT/ML IJ SOLN: 0.0000 [IU] | Freq: Every day | INTRAMUSCULAR | Status: DC

## 2023-04-08 MED ORDER — ACETAMINOPHEN 325 MG PO TABS: 650.0000 mg | ORAL_TABLET | Freq: Four times a day (QID) | ORAL | Status: DC | PRN

## 2023-04-08 MED ORDER — PHENAZOPYRIDINE HCL 100 MG PO TABS
100.0000 mg | ORAL_TABLET | Freq: Three times a day (TID) | ORAL | Status: DC | PRN
  Administered 2023-04-09: 100 mg via ORAL
  Filled 2023-04-08 (×2): qty 1

## 2023-04-08 MED ORDER — ALBUTEROL SULFATE (2.5 MG/3ML) 0.083% IN NEBU: 2.5000 mg | INHALATION_SOLUTION | Freq: Four times a day (QID) | RESPIRATORY_TRACT | Status: DC | PRN

## 2023-04-08 MED ORDER — HYDRALAZINE HCL 20 MG/ML IJ SOLN: 5.0000 mg | INTRAMUSCULAR | Status: DC | PRN

## 2023-04-08 MED ORDER — GABAPENTIN 100 MG PO CAPS
100.0000 mg | ORAL_CAPSULE | Freq: Every day | ORAL | Status: DC
  Administered 2023-04-09 (×2): 100 mg via ORAL
  Filled 2023-04-08 (×2): qty 1

## 2023-04-08 MED ORDER — INSULIN ASPART 100 UNIT/ML IJ SOLN
0.0000 [IU] | Freq: Three times a day (TID) | INTRAMUSCULAR | Status: DC
  Administered 2023-04-09: 3 [IU] via SUBCUTANEOUS
  Administered 2023-04-09: 1 [IU] via SUBCUTANEOUS
  Administered 2023-04-10 (×3): 2 [IU] via SUBCUTANEOUS

## 2023-04-08 MED ORDER — OMEGA-3-ACID ETHYL ESTERS 1 G PO CAPS
2.0000 g | ORAL_CAPSULE | Freq: Two times a day (BID) | ORAL | Status: DC
  Administered 2023-04-09 – 2023-04-10 (×4): 2 g via ORAL
  Filled 2023-04-08 (×5): qty 2

## 2023-04-08 MED ORDER — ONDANSETRON HCL 4 MG/2ML IJ SOLN: 4.0000 mg | Freq: Four times a day (QID) | INTRAMUSCULAR | Status: DC | PRN

## 2023-04-08 MED ORDER — AZATHIOPRINE 50 MG PO TABS
100.0000 mg | ORAL_TABLET | Freq: Every day | ORAL | Status: DC
  Administered 2023-04-09 – 2023-04-10 (×2): 100 mg via ORAL
  Filled 2023-04-08 (×4): qty 2

## 2023-04-08 MED ORDER — ENOXAPARIN SODIUM 40 MG/0.4ML IJ SOSY: 40.0000 mg | PREFILLED_SYRINGE | INTRAMUSCULAR | Status: DC

## 2023-04-08 MED ORDER — PYRIDOSTIGMINE BROMIDE 60 MG PO TABS
60.0000 mg | ORAL_TABLET | Freq: Three times a day (TID) | ORAL | Status: DC
  Administered 2023-04-09 – 2023-04-10 (×6): 60 mg via ORAL
  Filled 2023-04-08 (×8): qty 1

## 2023-04-08 MED ORDER — MELATONIN 5 MG PO TABS
5.0000 mg | ORAL_TABLET | Freq: Every evening | ORAL | Status: DC | PRN
  Administered 2023-04-09: 5 mg via ORAL
  Filled 2023-04-08: qty 1

## 2023-04-08 MED ORDER — CALCIUM CARBONATE-VITAMIN D 500-400 MG-UNIT PO TABS: 1.0000 | ORAL_TABLET | Freq: Every day | ORAL | Status: DC

## 2023-04-08 MED ORDER — ENOXAPARIN SODIUM 40 MG/0.4ML IJ SOSY
40.0000 mg | PREFILLED_SYRINGE | Freq: Every day | INTRAMUSCULAR | Status: DC
  Administered 2023-04-09 (×2): 40 mg via SUBCUTANEOUS
  Filled 2023-04-08 (×2): qty 0.4

## 2023-04-08 MED ORDER — SODIUM CHLORIDE 0.9 % IV BOLUS
500.0000 mL | Freq: Once | INTRAVENOUS | Status: AC
  Administered 2023-04-08: 500 mL via INTRAVENOUS

## 2023-04-08 NOTE — Progress Notes (Signed)
NIF was greater than 40 X3.  MD made aware.

## 2023-04-08 NOTE — ED Provider Notes (Signed)
[place] EMERGENCY DEPARTMENT AT [facility] Provider Note   CSN: [id number] Arrival date & time: 04/08/23  1840     History  Chief Complaint  Patient presents with   Weakness    Jessica Mclean is a 77 y.o. female.  Patient has a history of myasthenia gravis.  Patient states that she was dizzy and weak today and fell on the floor but was able to get back up.  Patient still felt very weak in her legs.  The history is provided by the patient and medical records. No language interpreter was used.  Weakness Severity:  Moderate Onset quality:  Sudden Timing:  Constant Progression:  Worsening Chronicity:  Recurrent Context: not alcohol use   Relieved by:  Nothing Worsened by:  Nothing Associated symptoms: no abdominal pain, no chest pain, no cough, no diarrhea, no frequency, no headaches and no seizures        Home Medications Prior to Admission medications   Medication Sig Start Date End Date Taking? Authorizing Provider  albuterol (VENTOLIN HFA) 108 (90 Base) MCG/ACT inhaler Inhale 2 puffs into the lungs every 6 (six) hours as needed for wheezing or shortness of breath. 06/11/22  Yes Anabel Halon, MD  amLODipine (NORVASC) 10 MG tablet TAKE 1 TABLET BY MOUTH DAILY 02/06/23  Yes Anabel Halon, MD  aspirin-acetaminophen-caffeine (EXCEDRIN EXTRA STRENGTH) [phone] MG tablet Take 2 tablets by mouth in the morning.   Yes [provider]  atorvastatin (LIPITOR) 80 MG tablet TAKE 1 TABLET BY MOUTH DAILY 12/01/22  Yes Anabel Halon, MD  azaTHIOprine (IMURAN) 50 MG tablet Take 2 tablets (100 mg total) by mouth daily. Take 1 tablet (50 mg) for 1 week, then increase to 2 tablets (100 mg) thereafter. 12/26/22  Yes Antony Madura, MD  calcium-vitamin D (OSCAL-500) 500-400 MG-UNIT tablet Take 1 tablet by mouth daily.   Yes [provider]  gabapentin (NEURONTIN) 100 MG capsule TAKE 1 CAPSULE BY MOUTH AT  BEDTIME 02/27/23  Yes Anabel Halon, MD  meclizine  (ANTIVERT) 25 MG tablet Take 1 tablet (25 mg total) by mouth 3 (three) times daily as needed for dizziness. 09/09/21  Yes Anabel Halon, MD  metFORMIN (GLUCOPHAGE) 1000 MG tablet TAKE 1 TABLET BY MOUTH TWICE  DAILY WITH MEALS 03/23/23  Yes Anabel Halon, MD  Multiple Vitamins-Minerals (PRESERVISION AREDS PO) Take 2 capsules by mouth daily.   Yes [provider]  omega-3 acid ethyl esters (LOVAZA) 1 g capsule Take 2 g by mouth 2 (two) times daily.   Yes [provider]  phenazopyridine (PYRIDIUM) 100 MG tablet Take 1 tablet (100 mg total) by mouth 3 (three) times daily as needed for pain (Dysuria). 10/08/21  Yes Anabel Halon, MD  predniSONE (DELTASONE) 10 MG tablet Take 1 tablet (10 mg total) by mouth daily with breakfast. Take 2 tablets (20 mg) daily 02/06/23  Yes Hill, Manus Gunning, MD  pyridostigmine (MESTINON) 60 MG tablet Take 1 tablet (60 mg total) by mouth 3 (three) times daily. 11/06/22  Yes Hill, Manus Gunning, MD  Semaglutide, 1 MG/DOSE, 4 MG/3ML SOPN Inject 1 mg as directed once a week. 01/15/23  Yes Anabel Halon, MD  telmisartan (MICARDIS) 40 MG tablet TAKE 1 TABLET BY MOUTH DAILY 10/21/22  Yes Anabel Halon, MD  ACCU-CHEK GUIDE test strip USE TO CHECK BLOOD SUGAR 4 TIMES DAILY 12/26/22   Billie Lade, MD  Accu-Chek Softclix Lancets lancets USE TO CHECK BLOOD SUGAR 4  TIMES DAILY 12/26/22   Billie Lade, MD  blood glucose meter kit and supplies Dispense based on patient and insurance preference. Use up to four times daily as directed. (FOR ICD-10 E10.9, E11.9). 04/15/22   Anabel Halon, MD  Blood Glucose Monitoring Suppl (ACCU-CHEK GUIDE) w/Device KIT  01/21/20   [provider]  Continuous Glucose Receiver (DEXCOM G7 RECEIVER) DEVI USE TO CHECK BLOOD GLUCOSE 3  TIMES DAILY BEFORE MEALS, AT  BEDTIME AND AS NEEDED 02/09/23   Anabel Halon, MD  Continuous Glucose Sensor (DEXCOM G7 SENSOR) MISC USE TO CHECK BLOOD GLUCOSE 3  TIMES DAILY BEFORE MEALS, AT  BEDTIME AND AS  NEEDED 02/26/23   Anabel Halon, MD      Allergies    Other and Poison ivy treatments    Review of Systems   Review of Systems  Constitutional:  Negative for appetite change and fatigue.  HENT:  Negative for congestion, ear discharge and sinus pressure.   Eyes:  Negative for discharge.  Respiratory:  Negative for cough.   Cardiovascular:  Negative for chest pain.  Gastrointestinal:  Negative for abdominal pain and diarrhea.  Genitourinary:  Negative for frequency and hematuria.  Musculoskeletal:  Negative for back pain.  Skin:  Negative for rash.  Neurological:  Positive for weakness. Negative for seizures and headaches.  Psychiatric/Behavioral:  Negative for hallucinations.     Physical Exam Updated Vital Signs BP 132/84   Pulse 96   Temp 97.8 F (36.6 C)   Resp 18   Ht 5\' 4"  (1.626 m)   Wt 56 kg   LMP  (LMP Unknown)   SpO2 100%   BMI 21.19 kg/m  Physical Exam Vitals and nursing note reviewed.  Constitutional:      Appearance: She is well-developed.  HENT:     Head: Normocephalic.     Nose: Nose normal.  Eyes:     General: No scleral icterus.    Conjunctiva/sclera: Conjunctivae normal.  Neck:     Thyroid: No thyromegaly.  Cardiovascular:     Rate and Rhythm: Normal rate and regular rhythm.     Heart sounds: No murmur heard.    No friction rub. No gallop.  Pulmonary:     Breath sounds: No stridor. No wheezing or rales.  Chest:     Chest wall: No tenderness.  Abdominal:     General: There is no distension.     Tenderness: There is no abdominal tenderness. There is no rebound.  Musculoskeletal:        General: Normal range of motion.     Cervical back: Neck supple.     Comments: General Weakness in legs.  Patient able to stand but is unsteady  Lymphadenopathy:     Cervical: No cervical adenopathy.  Skin:    Findings: No erythema or rash.  Neurological:     Mental Status: She is oriented to person, place, and time.     Motor: No abnormal muscle tone.      Coordination: Coordination normal.  Psychiatric:        Behavior: Behavior normal.     ED Results / Procedures / Treatments   Labs (all labs ordered are listed, but only abnormal results are displayed) Labs Reviewed  CBC WITH DIFFERENTIAL/PLATELET - Abnormal; Notable for the following components:      Result Value   Hemoglobin 11.4 (*)    RDW 16.4 (*)    Lymphs Abs 0.5 (*)    All other components  within normal limits  COMPREHENSIVE METABOLIC PANEL - Abnormal; Notable for the following components:   CO2 19 (*)    Glucose, Bld 217 (*)    BUN 26 (*)    Total Protein 8.4 (*)    GFR, Estimated 58 (*)    All other components within normal limits  TROPONIN I (HIGH SENSITIVITY)    EKG EKG Interpretation Date/Time:  Wednesday April 08 2023 20:14:05 EST Ventricular Rate:  89 PR Interval:  131 QRS Duration:  126 QT Interval:  372 QTC Calculation: 453 R Axis:   81  Text Interpretation: Sinus rhythm Right bundle branch block Confirmed by Bethann Berkshire [phone]) on 04/08/2023 8:55:32 PM  Radiology No results found.  Procedures Procedures    Medications Ordered in ED Medications  sodium chloride 0.9 % bolus 500 mL (500 mLs Intravenous Bolus 04/08/23 2016)    ED Course/ Medical Decision Making/ A&P                                 Medical Decision Making Amount and/or Complexity of Data Reviewed Labs: ordered. Radiology: ordered. ECG/medicine tests: ordered.  Risk Prescription drug management. Decision regarding hospitalization.   Patient with most likely exacerbation of her myasthenia gravis.  She will be seen by neurology and admitted to medicine        Final Clinical Impression(s) / ED Diagnoses Final diagnoses:  None    Rx / DC Orders ED Discharge Orders     None         Bethann Berkshire, MD 04/10/23 1304

## 2023-04-08 NOTE — Telephone Encounter (Signed)
Called and left message that I did not see any note that someone called. Call back at [phone]

## 2023-04-08 NOTE — ED Notes (Signed)
Patient transported to CT 

## 2023-04-08 NOTE — ED Notes (Signed)
EDP at bedside  

## 2023-04-08 NOTE — Telephone Encounter (Signed)
Left message with the after hour service on 04-08-23 at 12:42 pm  Caller states that she is returning a call to the nurse

## 2023-04-08 NOTE — ED Triage Notes (Signed)
Pt c/o weakness and involuntary tremor since last night. Blood sugars in the 300s.

## 2023-04-08 NOTE — H&P (Addendum)
TRH H&P   Patient Demographics:    Jessica Mclean, is a 77 y.o. female  MRN: [id number]   DOB - November 25, 1945  Admit Date - 04/08/2023  Outpatient Primary MD for the patient is Anabel Halon, MD  Referring MD/NP/PA: Dr Estell Harpin  Outpatient Specialists: Neurology Dr Loleta Chance    Patient coming from: home  Chief Complaint  Patient presents with   Weakness      HPI:    Jessica Mclean  is a 77 y.o. female, hyperlipidemia, hypertension, TIA, cervical cancer, depression, vitamin D deficiency, sleep apnea, cystitis-dysuria, diagnosis of myasthenia gravis, with positive ACH R antibodies, followed by Dr. Loleta Chance in [place], currently on pyridostigmine, azathioprine, prednisone 10 mg oral daily and IVIG at home, with some residual dysphagia, patient presents to ED today secondary to complaints of weakness, reports overall she is feeling much better than her previous myasthenia gravis last summer after starting her therapies, but she does report overall progressive decline over last 2 weeks, where reports her dysphagia has been worsening, and her weakness has been worsening over the last 2 weeks, worsening today progresses, patient reports today she had an event where she felt generalized weakness, shaky with tremors, where she reports falling on the floor with loss of consciousness, reports upon regaining consciousness she called her neurology office, which called EMS to come to ED, patient denies chest pain, shortness of breath, any focal deficits, tingling or numbness. -In ED.   Review of systems:      A full 10 point Review of Systems was done, except as stated above, all other Review of Systems were negative.   With Past History of the following :    Past Medical History:  Diagnosis Date   Bleeding disorder (HCC)    Cervical cancer (HCC) 2010   Diabetes mellitus without complication  (HCC)    type 2   Diabetes mellitus without complication (HCC)    Heart murmur    High cholesterol    HLD (hyperlipidemia)    Hypertension    Mental disorder    depression; suicidal ideation in 2017   Sleep apnea    Vitamin D deficiency       Past Surgical History:  Procedure Laterality Date   BILATERAL OOPHORECTOMY     CESAREAN SECTION     x 4   CHOLECYSTECTOMY     COLOSTOMY     x2   CYSTOSCOPY WITH URETHRAL DILATATION N/A 11/15/2020   Procedure: CYSTOSCOPY WITH URETHRAL DILATION;  Surgeon: Malen Gauze, MD;  Location: [facility];  Service: Urology;  Laterality: N/A;   SALPINGECTOMY     TONSILLECTOMY     TONSILLECTOMY AND ADENOIDECTOMY        Social History:     Social History   Tobacco Use   Smoking status: Never   Smokeless tobacco: Never  Substance Use Topics   Alcohol use: Never  Family History :     Family History  Problem Relation Age of Onset   Cancer Father        kidney   Cancer Mother        lung   Other Brother        heart issues     Home Medications:   Prior to Admission medications   Medication Sig Start Date End Date Taking? Authorizing Provider  albuterol (VENTOLIN HFA) 108 (90 Base) MCG/ACT inhaler Inhale 2 puffs into the lungs every 6 (six) hours as needed for wheezing or shortness of breath. 06/11/22  Yes Anabel Halon, MD  amLODipine (NORVASC) 10 MG tablet TAKE 1 TABLET BY MOUTH DAILY 02/06/23  Yes Anabel Halon, MD  aspirin-acetaminophen-caffeine (EXCEDRIN EXTRA STRENGTH) [phone] MG tablet Take 2 tablets by mouth in the morning.   Yes [provider]  atorvastatin (LIPITOR) 80 MG tablet TAKE 1 TABLET BY MOUTH DAILY 12/01/22  Yes Anabel Halon, MD  azaTHIOprine (IMURAN) 50 MG tablet Take 2 tablets (100 mg total) by mouth daily. Take 1 tablet (50 mg) for 1 week, then increase to 2 tablets (100 mg) thereafter. 12/26/22  Yes Antony Madura, MD  calcium-vitamin D (OSCAL-500) 500-400 MG-UNIT tablet Take 1 tablet by mouth  daily.   Yes [provider]  gabapentin (NEURONTIN) 100 MG capsule TAKE 1 CAPSULE BY MOUTH AT  BEDTIME 02/27/23  Yes Anabel Halon, MD  meclizine (ANTIVERT) 25 MG tablet Take 1 tablet (25 mg total) by mouth 3 (three) times daily as needed for dizziness. 09/09/21  Yes Anabel Halon, MD  metFORMIN (GLUCOPHAGE) 1000 MG tablet TAKE 1 TABLET BY MOUTH TWICE  DAILY WITH MEALS 03/23/23  Yes Anabel Halon, MD  Multiple Vitamins-Minerals (PRESERVISION AREDS PO) Take 2 capsules by mouth daily.   Yes [provider]  omega-3 acid ethyl esters (LOVAZA) 1 g capsule Take 2 g by mouth 2 (two) times daily.   Yes [provider]  phenazopyridine (PYRIDIUM) 100 MG tablet Take 1 tablet (100 mg total) by mouth 3 (three) times daily as needed for pain (Dysuria). 10/08/21  Yes Anabel Halon, MD  predniSONE (DELTASONE) 10 MG tablet Take 1 tablet (10 mg total) by mouth daily with breakfast. Take 2 tablets (20 mg) daily 02/06/23  Yes Hill, Manus Gunning, MD  pyridostigmine (MESTINON) 60 MG tablet Take 1 tablet (60 mg total) by mouth 3 (three) times daily. 11/06/22  Yes Hill, Manus Gunning, MD  Semaglutide, 1 MG/DOSE, 4 MG/3ML SOPN Inject 1 mg as directed once a week. 01/15/23  Yes Anabel Halon, MD  telmisartan (MICARDIS) 40 MG tablet TAKE 1 TABLET BY MOUTH DAILY 10/21/22  Yes Anabel Halon, MD  ACCU-CHEK GUIDE test strip USE TO CHECK BLOOD SUGAR 4 TIMES DAILY 12/26/22   Billie Lade, MD  Accu-Chek Softclix Lancets lancets USE TO CHECK BLOOD SUGAR 4 TIMES DAILY 12/26/22   Billie Lade, MD  blood glucose meter kit and supplies Dispense based on patient and insurance preference. Use up to four times daily as directed. (FOR ICD-10 E10.9, E11.9). 04/15/22   Anabel Halon, MD  Blood Glucose Monitoring Suppl (ACCU-CHEK GUIDE) w/Device KIT  01/21/20   [provider]  Continuous Glucose Receiver (DEXCOM G7 RECEIVER) DEVI USE TO CHECK BLOOD GLUCOSE 3  TIMES DAILY BEFORE MEALS, AT  BEDTIME AND AS  NEEDED 02/09/23   Anabel Halon, MD  Continuous Glucose Sensor (DEXCOM G7 SENSOR) MISC USE TO  CHECK BLOOD GLUCOSE 3  TIMES DAILY BEFORE MEALS, AT  BEDTIME AND AS NEEDED 02/26/23   Anabel Halon, MD     Allergies:     Allergies  Allergen Reactions   Other     Nickel staples   Poison Ivy Treatments     Poison Ivy      Physical Exam:   Vitals  Blood pressure 132/84, pulse 96, temperature 97.8 F (36.6 C), resp. rate 18, height 5\' 4"  (1.626 m), weight 56 kg, SpO2 100%.   1. General Frail elderly female, laying in bed, no apparent distress  2. Normal affect and insight, Not Suicidal or Homicidal, Awake Alert, Oriented X 3.  3. No F.N deficits, ALL C.Nerves Intact, Strength 5/5 all 4 extremities, Sensation intact all 4 extremities, Plantars down going.  But she is very unsteady, wobbly upon standing up  4. Ears and Eyes appear Normal, Conjunctivae clear, PERRLA. Moist Oral Mucosa.  5. Supple Neck, No JVD, No cervical lymphadenopathy appriciated, No Carotid Bruits.  6. Symmetrical Chest wall movement, Good air movement bilaterally, CTAB.  7. RRR, No Gallops, Rubs or Murmurs, No Parasternal Heave.  8. Positive Bowel Sounds, Abdomen Soft, No tenderness, No organomegaly appriciated,No rebound -guarding or rigidity.  9.  No Cyanosis, Normal Skin Turgor, No Skin Rash or Bruise.  10. Good muscle tone,  joints appear normal , no effusions, Normal ROM.     Data Review:    CBC Recent Labs  Lab 04/08/23 2005  WBC 6.1  HGB 11.4*  HCT 36.1  PLT 315  MCV 87.6  MCH 27.7  MCHC 31.6  RDW 16.4*  LYMPHSABS 0.5*  MONOABS 0.2  EOSABS 0.0  BASOSABS 0.0   ------------------------------------------------------------------------------------------------------------------  Chemistries  Recent Labs  Lab 04/08/23 2005  NA 135  K 3.8  CL 104  CO2 19*  GLUCOSE 217*  BUN 26*  CREATININE 1.00  CALCIUM 9.8  AST 20  ALT 12  ALKPHOS 52  BILITOT 0.7    ------------------------------------------------------------------------------------------------------------------ estimated creatinine clearance is 40.7 mL/min (by C-G formula based on SCr of 1 mg/dL). ------------------------------------------------------------------------------------------------------------------ No results for input(s): "TSH", "T4TOTAL", "T3FREE", "THYROIDAB" in the last 72 hours.  Invalid input(s): "FREET3"  Coagulation profile No results for input(s): "INR", "PROTIME" in the last 168 hours. ------------------------------------------------------------------------------------------------------------------- No results for input(s): "DDIMER" in the last 72 hours. -------------------------------------------------------------------------------------------------------------------  Cardiac Enzymes No results for input(s): "CKMB", "TROPONINI", "MYOGLOBIN" in the last 168 hours.  Invalid input(s): "CK" ------------------------------------------------------------------------------------------------------------------ No results found for: "BNP"   ---------------------------------------------------------------------------------------------------------------  Urinalysis    Component Value Date/Time   COLORURINE YELLOW 11/23/2019 0133   APPEARANCEUR Hazy (A) 04/07/2022 1555   LABSPEC 1.016 11/23/2019 0133   PHURINE 5.0 11/23/2019 0133   GLUCOSEU Negative 04/07/2022 1555   HGBUR NEGATIVE 11/23/2019 0133   BILIRUBINUR Negative 04/07/2022 1555   KETONESUR negative 03/08/2020 1429   KETONESUR NEGATIVE 11/23/2019 0133   PROTEINUR 2+ (A) 04/07/2022 1555   PROTEINUR 30 (A) 11/23/2019 0133   UROBILINOGEN 0.2 03/08/2020 1429   NITRITE Negative 04/07/2022 1555   NITRITE NEGATIVE 11/23/2019 0133   LEUKOCYTESUR Negative 04/07/2022 1555   LEUKOCYTESUR NEGATIVE 11/23/2019 0133     ----------------------------------------------------------------------------------------------------------------   Imaging Results:    CT Head Wo Contrast  Result Date: 04/08/2023 CLINICAL DATA:  Neurologic deficit. EXAM: CT HEAD WITHOUT CONTRAST TECHNIQUE: Contiguous axial images were obtained from the base of the skull through the vertex without intravenous contrast. RADIATION DOSE REDUCTION: This exam was performed according to the departmental dose-optimization program which includes  automated exposure control, adjustment of the mA and/or kV according to patient size and/or use of iterative reconstruction technique. COMPARISON:  Head CT dated 03/23/2021. FINDINGS: Brain: Mild age-related atrophy and chronic microvascular ischemic changes. There is no acute intracranial hemorrhage. No mass effect or midline shift. No extra-axial fluid collection. Vascular: No hyperdense vessel or unexpected calcification. Skull: Normal. Negative for fracture or focal lesion. Sinuses/Orbits: No acute finding. Other: None IMPRESSION: 1. No acute intracranial pathology. 2. Mild age-related atrophy and chronic microvascular ischemic changes. Electronically Signed   By: Elgie Collard M.D.   On: 04/08/2023 22:21    EKG Vent. rate 89 BPM PR interval 131 ms QRS duration 126 ms QT/QTcB 372/453 ms P-R-T axes 80 81 55 Sinus rhythm Right bundle branch block  Assessment & Plan:    Principal Problem:   Myasthenia gravis in crisis [facility]) Active Problems:   Essential hypertension   Type 2 diabetes mellitus with hyperlipidemia (HCC)   Idiopathic peripheral neuropathy   Diabetic neuropathy (HCC)   Depression   Hyperlipidemia   Chronic cystitis   Syncope   Myasthenia gravis with (acute) exacerbation (HCC)   Progressive  weakness with concern of myasthenia gravis with acute exacerbation -Findings are concerning for acute exacerbation of her myasthenia gravis -Neurology consult greatly appreciated, will  proceed with MRI brain and cervical spine to rule out any other pathologies contributing to her findings -Sinew with home medication including pyridostigmine, azathioprine and steroid at current dose -Patient will be transferred to [facility] for further evaluation by neurology regarding concern for further IVIG versus plasmapheresis -consult PT, OT -Given known dysphagia in the past, will keep on dysphagia 2 with nectar thick till evaluated by SLP -NIF and VC every 12 hours  -Neurology to evaluate when she gets to [facility]  Syncope -Patient presentation unclear if related to syncope on the fall from weakness, so she will be monitored on telemetry, I will obtain orthostatics and hold blood pressure medication till then  -Will check 2D echo -Will obtain carotid Doppler -Will recheck B12 level, vitamin D level as they were borderline last time  Hypertension -Her blood pressure is acceptable currently, given the fact she presents with syncope I will hold for now till were able to obtain orthostasis, I will keep on as needed hydralazine, if blood pressure starts to increase then will resume on her home medications   Hyperlipidemia -will hold statin given possible myasthenia gravis Bashan -Continue with Lovaza  Diabetes mellitus A1c of 7.3 -Will hold metformin and semaglutide, will keep an insulin sliding scale during hospital stay  Chronic cystitis -Will check UA, continue with radium as needed     DVT Prophylaxis Lovenox   AM Labs Ordered, also please review Full Orders  Family Communication: Admission, patients condition and plan of care including tests being ordered have been discussed with the patient and daughter at bedside who indicate understanding and agree with the plan and Code Status.  Code Status full code  Likely DC to home  Condition GUARDED    Consults called: Neurology,  Admission status: Inpatient  Time spent in minutes : 75  minutes   Huey Bienenstock M.D on 04/08/2023 at 10:30 PM   [facility] - Office  [phone]

## 2023-04-08 NOTE — Telephone Encounter (Signed)
Patient called back I explain to her that we did not see where anyone called her and she has appt tomorrow so maybe we were calling about that. She will be at the appt tomorrow

## 2023-04-08 NOTE — Consult Note (Signed)
[facility] Consult Services  Stat Consult  Patient Name:   Jessica Mclean, Jessica Mclean Date of Birth:   06/26/1945 Identification Number:   MRN - [id number] Date of Service:   04/08/2023 21:20:47  Diagnosis:       M62.81 - Generalized Muscle Weakness       R26.81 - Unsteady gait  Impression 76yoF hx of DM, HTN, HLD, cervical cancer s/p radiation, TIA, depression, OSA, MG (Ab positive, on monthly IVIG and prednisone 10mg  daily) present with two week history of worsening unsteady gait and generalized weakness. Had unwitnessed syncopal episode today. Denies dizziness. On exam, no focal deficit, no obvious fatigable weakness. Appear shaking when standing, unable to safely ambulate independently. CT head neg for acute finding. CBC and CMP unremarkable. DDx includes MG exacerbation, though unusual with month IVIG and most recent one a week ago. Exam is reassuring, no overt truncal/facial weakness or respiratory issue. No known exacerbating factor base on history at this time. Syncopal is not a known effect of MG, likely separate etiology.  Recommendation  - Check MR brain w/o and MR C-spine w/o for alternative cause of unsteady gait and dysphagia - Continue current MG regimen: Imuran, Prednisone 10mg , and Mestinon 60mg  q6h - Q6-8h NIF and VC monitoring. Consider elective intubation if VC < 15/68ml/kg or NIF <-25 to -30 cmH2O. - PT/OT/ST eval - If no alterative reason and patient continued to have fatigeable weakness, recommend PLEX as next step management for MG - Syncopal workup: vascular imagine, orthostatic vital sign, routine EEG  Avoid drugs that exacerbate MG (Caution w/ beta blockers, fluoroquinolones, aminoglycosides, neuromuscular blocking agents, antacids, laxatives, iodinated contrast, Haldol      Recommendations: Our recommendations are outlined below.  Disposition : Neurology will  follow    ----------------------------------------------------------------------------------------------------    Metrics: Dispatch Time: 04/08/2023 21:08:42 Callback Response Time: 04/08/2023 21:21:04  Primary Provider Notified of Diagnostic Impression and Management Plan on: 04/08/2023 22:21:27     ----------------------------------------------------------------------------------------------------  Chief Complaint: Dizzy, generalized weakness  History of Present Illness: Patient is a 77 year old Female. It was difficult to obtain a clear timeline from patient. Per latest neurology note: Patient was diagnosed with AChR ab positive generalized myasthena gravis. She initially presented with vertigo and dysarthria. Vertigo did resolved, though she continued to have falls and worsening fatigue at the end of the day. During 10/2022 consult, patient had diplopia, ptosis, dysarthria, choking on solid. Has vegan diet. She received IVIG from home and been on prednisone and mestinon. Plan was for IVIG monthly for likely 6 months as a bridge to steroid sparing agent Imuran. Suppose to be on Mestinon 60mg  TID and prednisone 10mg  daily. Suppose to have follow up visit 04/09/23. Per report patient has been experiencing more trouble walking in the past two weeks, possibly worse at the end of the day. She also reports feeling of short of breath and difficulty swallowing. Earlier today she experiencing both hand shaking and woke up on the floor for unclear period of time. She called neurology office after she woke up, whio called EMS for her. Patient reports last IVIG was given last week.   Past Medical History: Other PMH:  DM, HTN, HLD, cervical cancer s/p radiation, TIA, depression, OSA, MG  Medications:  No Anticoagulant use  No Antiplatelet use Reviewed EMR for current medications  Allergies:  Reviewed,NKDA  Social History: Drug Use: No  Family History:  There is no family history  of premature cerebrovascular disease pertinent to this consultation  ROS : 14 Points  Review of Systems was performed and was negative except mentioned in HPI.  Past Surgical History: There Is No Surgical History Contributory To Today's Visit   Examination: BP(132/84), Pulse(96), Blood Glucose(217)  Neuro Exam: General: Alert,Awake, Oriented to Time, Place, Person  Speech: Fluent:  Language: Intact:  Face: Symmetric:  Extraocular Movements: Intact:  Motor Exam: No Drift:  Coordination: Intact:  Appear shaking when standing. Able to hold herself up. Sustained upward gaze: no ptosis Neck extensor: appear intact Neck flexor: appear intact Repetitive shoulder abduction: no significant weakness Repetitive hip flexor: no significant weakness     Spoke with : Hospitalist (on video)    This consult was conducted in real time using interactive audio and Immunologist. Patient was informed of the technology being used for this visit and agreed to proceed. Patient located in hospital and provider located at home/office setting.  Patient is being evaluated for possible acute neurologic impairment and high probability of imminent or life - threatening deterioration.I spent total of 70 minutes providing care to this patient, including time for face to face visit via telemedicine, review of medical records, imaging studies and discussion of findings with providers, the patient and / or family.   Dr Larkin Ina   [facility] For Inpatient follow-up with [facility] physician please call [facility] at [phone]. As we are not an outpatient service for any post hospital discharge needs please contact the hospital for assistance.  If you have any questions for the [facility] physicians or need to reconsult for clinical or diagnostic changes please contact us via [facility] at [phone].

## 2023-04-08 NOTE — Progress Notes (Addendum)
Did Vital Capacity on patient.  Patient achieved 2.6L, which was best out of three.  MD aware.

## 2023-04-08 NOTE — ED Notes (Signed)
Pt returned from CT °

## 2023-04-08 NOTE — ED Notes (Signed)
Patient transported to MRI 

## 2023-04-09 ENCOUNTER — Inpatient Hospital Stay (HOSPITAL_COMMUNITY): Payer: 59

## 2023-04-09 ENCOUNTER — Telehealth: Payer: Self-pay | Admitting: Neurology

## 2023-04-09 ENCOUNTER — Ambulatory Visit: Payer: 59 | Admitting: Neurology

## 2023-04-09 DIAGNOSIS — R55 Syncope and collapse: Secondary | ICD-10-CM

## 2023-04-09 DIAGNOSIS — R531 Weakness: Secondary | ICD-10-CM | POA: Diagnosis not present

## 2023-04-09 DIAGNOSIS — G7000 Myasthenia gravis without (acute) exacerbation: Secondary | ICD-10-CM | POA: Diagnosis not present

## 2023-04-09 DIAGNOSIS — G7001 Myasthenia gravis with (acute) exacerbation: Secondary | ICD-10-CM

## 2023-04-09 LAB — ECHOCARDIOGRAM COMPLETE
AR max vel: 1.78 cm2
AV Area VTI: 1.98 cm2
AV Area mean vel: 1.73 cm2
AV Mean grad: 5 mm[Hg]
AV Peak grad: 11 mm[Hg]
Ao pk vel: 1.66 m/s
Area-P 1/2: 3.42 cm2
Calc EF: 62.4 %
Height: 64 in
MV VTI: 2.19 cm2
S' Lateral: 2.9 cm
Single Plane A2C EF: 61.6 %
Single Plane A4C EF: 62.8 %
Weight: 1975.32 [oz_av]

## 2023-04-09 LAB — HEMOGLOBIN A1C
Hgb A1c MFr Bld: 6.7 % — ABNORMAL HIGH (ref 4.8–5.6)
Mean Plasma Glucose: 145.59 mg/dL

## 2023-04-09 LAB — TROPONIN I (HIGH SENSITIVITY): Troponin I (High Sensitivity): 4 ng/L (ref <18)

## 2023-04-09 LAB — CBC
HCT: 30.7 % — ABNORMAL LOW (ref 36.0–46.0)
Hemoglobin: 9.5 g/dL — ABNORMAL LOW (ref 12.0–15.0)
MCH: 27.9 pg (ref 26.0–34.0)
MCHC: 30.9 g/dL (ref 30.0–36.0)
MCV: 90 fL (ref 80.0–100.0)
Platelets: 296 10*3/uL (ref 150–400)
RBC: 3.41 MIL/uL — ABNORMAL LOW (ref 3.87–5.11)
RDW: 16.7 % — ABNORMAL HIGH (ref 11.5–15.5)
WBC: 4.7 10*3/uL (ref 4.0–10.5)
nRBC: 0 % (ref 0.0–0.2)

## 2023-04-09 LAB — FOLATE: Folate: 23.5 ng/mL (ref >5.9)

## 2023-04-09 LAB — BASIC METABOLIC PANEL
Anion gap: 7 (ref 5–15)
BUN: 25 mg/dL — ABNORMAL HIGH (ref 8–23)
CO2: 24 mmol/L (ref 22–32)
Calcium: 9.1 mg/dL (ref 8.9–10.3)
Chloride: 106 mmol/L (ref 98–111)
Creatinine, Ser: 0.82 mg/dL (ref 0.44–1.00)
GFR, Estimated: >60 mL/min (ref >60)
Glucose, Bld: 136 mg/dL — ABNORMAL HIGH (ref 70–99)
Potassium: 3.5 mmol/L (ref 3.5–5.1)
Sodium: 137 mmol/L (ref 135–145)

## 2023-04-09 LAB — GLUCOSE, CAPILLARY
Glucose-Capillary: 130 mg/dL — ABNORMAL HIGH (ref 70–99)
Glucose-Capillary: 201 mg/dL — ABNORMAL HIGH (ref 70–99)
Glucose-Capillary: 165 mg/dL — ABNORMAL HIGH (ref 70–99)

## 2023-04-09 LAB — VITAMIN D 25 HYDROXY (VIT D DEFICIENCY, FRACTURES): Vit D, 25-Hydroxy: 27.45 ng/mL — ABNORMAL LOW (ref 30–100)

## 2023-04-09 LAB — VITAMIN B12: Vitamin B-12: 153 pg/mL — ABNORMAL LOW (ref 180–914)

## 2023-04-09 LAB — CBG MONITORING, ED
Glucose-Capillary: 113 mg/dL — ABNORMAL HIGH (ref 70–99)
Glucose-Capillary: 99 mg/dL (ref 70–99)

## 2023-04-09 MED ORDER — THIAMINE HCL 100 MG/ML IJ SOLN
500.0000 mg | Freq: Every day | INTRAVENOUS | Status: AC
  Administered 2023-04-09 – 2023-04-10 (×2): 500 mg via INTRAVENOUS
  Filled 2023-04-09 (×2): qty 5

## 2023-04-09 MED ORDER — CYANOCOBALAMIN 1000 MCG/ML IJ SOLN
1000.0000 ug | Freq: Every day | INTRAMUSCULAR | Status: DC
  Administered 2023-04-10: 1000 ug via INTRAMUSCULAR
  Filled 2023-04-09 (×2): qty 1

## 2023-04-09 MED ORDER — VITAMIN B-12 1000 MCG PO TABS: 1000.0000 ug | ORAL_TABLET | Freq: Every day | ORAL | Status: DC

## 2023-04-09 MED ORDER — PREDNISONE 5 MG PO TABS
10.0000 mg | ORAL_TABLET | Freq: Every day | ORAL | Status: DC
  Administered 2023-04-09 – 2023-04-10 (×2): 10 mg via ORAL
  Filled 2023-04-09 (×2): qty 2

## 2023-04-09 MED ORDER — CYANOCOBALAMIN 1000 MCG/ML IJ SOLN
1000.0000 ug | Freq: Once | INTRAMUSCULAR | Status: AC
  Administered 2023-04-09: 1000 ug via INTRAMUSCULAR
  Filled 2023-04-09: qty 1

## 2023-04-09 NOTE — ED Notes (Signed)
Pt ambulated with assistance to the restroom.

## 2023-04-09 NOTE — ED Notes (Signed)
ED TO INPATIENT HANDOFF REPORT  ED Nurse Name and Phone #: [phone]  S Name/Age/Gender Irven Baltimore 77 y.o. female Room/Bed: APA04/APA04  Code Status   Code Status: Full Code  Home/SNF/Other Home Patient oriented to: self, place, time, and situation Is this baseline? Yes   Triage Complete: Triage complete  Chief Complaint Myasthenia gravis in crisis (HCC) [G70.01] Myasthenia gravis with (acute) exacerbation (HCC) [G70.01]  Triage Note Pt c/o weakness and involuntary tremor since last night. Blood sugars in the 300s.   Allergies Allergies  Allergen Reactions   Other     Nickel staples   Poison Ivy Treatments     Poison Ivy     Level of Care/Admitting Diagnosis ED Disposition     ED Disposition  Admit   Condition  --   Comment  Hospital Area: [facility] [[id number]]  Level of Care: Progressive [102]  Admit to Progressive based on following criteria: NEUROLOGICAL AND NEUROSURGICAL complex patients with significant risk of instability, who do not meet ICU criteria, yet require close observation or frequent assessment (< / = every 2 - 4 hours) with medical / nursing intervention.  May admit patient to [facility] or [facility] if equivalent level of care is available:: No  Covid Evaluation: Asymptomatic - no recent exposure (last 10 days) testing not required  Diagnosis: Myasthenia gravis with (acute) exacerbation [facility]) [[id number]]  Admitting Physician: Chiquita Loth  Attending Physician: Randol Kern, DAWOOD S [[id number]]  Certification:: I certify this patient will need inpatient services for at least 2 midnights          B Medical/Surgery History Past Medical History:  Diagnosis Date   Bleeding disorder (HCC)    Cervical cancer (HCC) 2010   Diabetes mellitus without complication (HCC)    type 2   Diabetes mellitus without complication (HCC)    Heart murmur    High cholesterol    HLD (hyperlipidemia)    Hypertension     Mental disorder    depression; suicidal ideation in 2017   Sleep apnea    Vitamin D deficiency    Past Surgical History:  Procedure Laterality Date   BILATERAL OOPHORECTOMY     CESAREAN SECTION     x 4   CHOLECYSTECTOMY     COLOSTOMY     x2   CYSTOSCOPY WITH URETHRAL DILATATION N/A 11/15/2020   Procedure: CYSTOSCOPY WITH URETHRAL DILATION;  Surgeon: Malen Gauze, MD;  Location: [facility];  Service: Urology;  Laterality: N/A;   SALPINGECTOMY     TONSILLECTOMY     TONSILLECTOMY AND ADENOIDECTOMY       A IV Location/Drains/Wounds Patient Lines/Drains/Airways Status     Active Line/Drains/Airways     Name Placement date Placement time Site Days   Peripheral IV 04/08/23 20 G 1" Posterior;Right Forearm 04/08/23  2003  Forearm  1            Intake/Output Last 24 hours No intake or output data in the 24 hours ending 04/09/23 0272  Labs/Imaging Results for orders placed or performed during the hospital encounter of 04/08/23 (from the past 48 hour(s))  CBC with Differential     Status: Abnormal   Collection Time: 04/08/23  8:05 PM  Result Value Ref Range   WBC 6.1 4.0 - 10.5 K/uL   RBC 4.12 3.87 - 5.11 MIL/uL   Hemoglobin 11.4 (L) 12.0 - 15.0 g/dL   HCT 53.6 64.4 - 03.4 %   MCV 87.6 80.0 - 100.0  fL   MCH 27.7 26.0 - 34.0 pg   MCHC 31.6 30.0 - 36.0 g/dL   RDW 54.6 (H) 27.0 - 35.0 %   Platelets 315 150 - 400 K/uL   nRBC 0.0 0.0 - 0.2 %   Neutrophils Relative % 86 %   Neutro Abs 5.3 1.7 - 7.7 K/uL   Lymphocytes Relative 9 %   Lymphs Abs 0.5 (L) 0.7 - 4.0 K/uL   Monocytes Relative 4 %   Monocytes Absolute 0.2 0.1 - 1.0 K/uL   Eosinophils Relative 0 %   Eosinophils Absolute 0.0 0.0 - 0.5 K/uL   Basophils Relative 1 %   Basophils Absolute 0.0 0.0 - 0.1 K/uL   Immature Granulocytes 0 %   Abs Immature Granulocytes 0.02 0.00 - 0.07 K/uL    Comment: Performed at [facility], [address], [place], [place] [address]  Comprehensive metabolic panel     Status: Abnormal    Collection Time: 04/08/23  8:05 PM  Result Value Ref Range   Sodium 135 135 - 145 mmol/L   Potassium 3.8 3.5 - 5.1 mmol/L   Chloride 104 98 - 111 mmol/L   CO2 19 (L) 22 - 32 mmol/L   Glucose, Bld 217 (H) 70 - 99 mg/dL    Comment: Glucose reference range applies only to samples taken after fasting for at least 8 hours.   BUN 26 (H) 8 - 23 mg/dL   Creatinine, Ser 8.29 0.44 - 1.00 mg/dL   Calcium 9.8 8.9 - 93.7 mg/dL   Total Protein 8.4 (H) 6.5 - 8.1 g/dL   Albumin 4.3 3.5 - 5.0 g/dL   AST 20 15 - 41 U/L   ALT 12 0 - 44 U/L   Alkaline Phosphatase 52 38 - 126 U/L   Total Bilirubin 0.7 <1.2 mg/dL   GFR, Estimated 58 (L) >60 mL/min    Comment: (NOTE) Calculated using the CKD-EPI Creatinine Equation (2021)    Anion gap 12 5 - 15    Comment: Performed at [facility], [address] [place], [place] [address]  Troponin I (High Sensitivity)     Status: None   Collection Time: 04/08/23  8:05 PM  Result Value Ref Range   Troponin I (High Sensitivity) 5 <18 ng/L    Comment: (NOTE) Elevated high sensitivity troponin I (hsTnI) values and significant  changes across serial measurements may suggest ACS but many other  chronic and acute conditions are known to elevate hsTnI results.  Refer to the "Links" section for chest pain algorithms and additional  guidance. Performed at [facility], [address] [place], [place] [address]   TSH     Status: None   Collection Time: 04/08/23  8:05 PM  Result Value Ref Range   TSH 0.484 0.350 - 4.500 uIU/mL    Comment: Performed by a 3rd Generation assay with a functional sensitivity of <=0.01 uIU/mL. Performed at [facility], [address], [place], [place] [address]   Hemoglobin A1c     Status: Abnormal   Collection Time: 04/08/23  8:05 PM  Result Value Ref Range   Hgb A1c MFr Bld 6.7 (H) 4.8 - 5.6 %    Comment: (NOTE) Pre diabetes:          5.7%-6.4%  Diabetes:              >6.4%  Glycemic control for   <7.0% adults with diabetes     Mean Plasma Glucose 145.59 mg/dL    Comment: Performed  at [facility], [address], [place], [place] [address]  Troponin I (High Sensitivity)     Status: None   Collection Time: 04/08/23 11:47 PM  Result Value Ref Range   Troponin I (High Sensitivity) 4 <18 ng/L    Comment: (NOTE) Elevated high sensitivity troponin I (hsTnI) values and significant  changes across serial measurements may suggest ACS but many other  chronic and acute conditions are known to elevate hsTnI results.  Refer to the "Links" section for chest pain algorithms and additional  guidance. Performed at [facility], [address] [place], [place] [address]   VITAMIN D 25 Hydroxy (Vit-D Deficiency, Fractures)     Status: Abnormal   Collection Time: 04/08/23 11:47 PM  Result Value Ref Range   Vit D, 25-Hydroxy 27.45 (L) 30 - 100 ng/mL    Comment: (NOTE) Vitamin D deficiency has been defined by the Institute of Medicine  and an Endocrine Society practice guideline as a level of serum 25-OH  vitamin D less than 20 ng/mL (1,2). The Endocrine Society went on to  further define vitamin D insufficiency as a level between 21 and 29  ng/mL (2).  1. IOM (Institute of Medicine). 2010. Dietary reference intakes for  calcium and D. [place]: [employer]. 2. Holick MF, Binkley [place], Bischoff-Ferrari HA, et al. Evaluation,  treatment, and prevention of vitamin D deficiency: an Endocrine  Society clinical practice guideline, JCEM. 2011 Jul; 96(7): 1911-30.  Performed at [facility], [address], [place], [place] [address]   CBG monitoring, ED     Status: Abnormal   Collection Time: 04/09/23  1:00 AM  Result Value Ref Range   Glucose-Capillary 113 (H) 70 - 99 mg/dL    Comment: Glucose reference range applies only to samples taken after fasting for at least 8 hours.  Vitamin B12     Status: Abnormal   Collection Time: 04/09/23  3:11 AM  Result Value Ref Range   Vitamin B-12 153 (L)  180 - 914 pg/mL    Comment: (NOTE) This assay is not validated for testing neonatal or myeloproliferative syndrome specimens for Vitamin B12 levels. Performed at [facility], [address], [place], [place] [address]   Basic metabolic panel     Status: Abnormal   Collection Time: 04/09/23  3:11 AM  Result Value Ref Range   Sodium 137 135 - 145 mmol/L   Potassium 3.5 3.5 - 5.1 mmol/L   Chloride 106 98 - 111 mmol/L   CO2 24 22 - 32 mmol/L   Glucose, Bld 136 (H) 70 - 99 mg/dL    Comment: Glucose reference range applies only to samples taken after fasting for at least 8 hours.   BUN 25 (H) 8 - 23 mg/dL   Creatinine, Ser 6.38 0.44 - 1.00 mg/dL   Calcium 9.1 8.9 - 75.6 mg/dL   GFR, Estimated >43 >32 mL/min    Comment: (NOTE) Calculated using the CKD-EPI Creatinine Equation (2021)    Anion gap 7 5 - 15    Comment: Performed at [facility], [address], [place], [place] [address]  CBC     Status: Abnormal   Collection Time: 04/09/23  3:11 AM  Result Value Ref Range   WBC 4.7 4.0 - 10.5 K/uL   RBC 3.41 (L) 3.87 - 5.11 MIL/uL   Hemoglobin 9.5 (L) 12.0 - 15.0 g/dL   HCT 41.6 (L) 60.6 - 30.1 %   MCV 90.0 80.0 -  100.0 fL   MCH 27.9 26.0 - 34.0 pg   MCHC 30.9 30.0 - 36.0 g/dL   RDW 78.2 (H) 95.6 - 21.3 %   Platelets 296 150 - 400 K/uL   nRBC 0.0 0.0 - 0.2 %    Comment: Performed at [facility], [address], [place], [place] [address]  CBG monitoring, ED     Status: None   Collection Time: 04/09/23  8:42 AM  Result Value Ref Range   Glucose-Capillary 99 70 - 99 mg/dL    Comment: Glucose reference range applies only to samples taken after fasting for at least 8 hours.   MR BRAIN WO CONTRAST  Result Date: 04/09/2023 CLINICAL DATA:  History of myasthenia gravis, progressive weakness and tremors, history of cervical myelopathy EXAM: MRI HEAD WITHOUT CONTRAST MRI CERVICAL SPINE WITHOUT CONTRAST TECHNIQUE: Multiplanar, multiecho pulse sequences of the brain and surrounding  structures, and cervical spine, to include the craniocervical junction and cervicothoracic junction, were obtained without intravenous contrast. COMPARISON:  10/14/2022 MRI head, correlation is also made with 04/08/2023 CT head; no prior MRI of the cervical spine, correlation is made with 12/11/2019 CT cervical spine FINDINGS: MRI HEAD FINDINGS Brain: No restricted diffusion to suggest acute or subacute infarct. No acute hemorrhage, mass, mass effect, or midline shift. No hydrocephalus or extra-axial collection. Partial empty sella. Craniocervical junction within normal limits. No hemosiderin deposition to suggest remote hemorrhage. T2 hyperintense signal in the periventricular white matter, likely the sequela of mild chronic small vessel ischemic disease. Vascular: Normal arterial flow voids. Skull and upper cervical spine: Normal marrow signal. Sinuses/Orbits: Clear paranasal sinuses. No acute finding in the orbits. Status post bilateral lens replacements. Other: The mastoid air cells are well aerated. MRI CERVICAL SPINE FINDINGS Alignment: No listhesis. Vertebrae: No acute fracture, evidence of discitis, or suspicious osseous lesion. Cord: Normal signal and morphology. Posterior Fossa, vertebral arteries, paraspinal tissues: Negative. Disc levels: C2-C3: No significant disc bulge. Left facet arthropathy. No spinal canal stenosis or neuroforaminal narrowing. C3-C4: No significant disc bulge. Facet arthropathy. No spinal canal stenosis. Mild left neural foraminal narrowing. C4-C5: Mild disc bulge. Facet arthropathy. Ligamentum flavum hypertrophy. Mild-to-moderate spinal canal stenosis. Mild right neural foraminal narrowing. C5-C6: No significant disc bulge. Facet and uncovertebral hypertrophy. No spinal canal stenosis. Mild bilateral neural foraminal narrowing. C6-C7: Mild disc bulge. Minimal disc bulge. No spinal canal stenosis or neural foraminal narrowing. C7-T1: No significant disc bulge. Right facet and  uncovertebral hypertrophy. No spinal canal stenosis. Mild right neural foraminal narrowing. IMPRESSION: 1. No acute intracranial process. No evidence of acute or subacute infarct. 2. C4-C5 mild-to-moderate spinal canal stenosis and mild right neural foraminal narrowing. 3. Additional mild neural foraminal narrowing on the left at C3-C4, bilaterally at C5-C6, and on the right at C7-T1. Electronically Signed   By: Wiliam Ke M.D.   On: 04/09/2023 01:48   MR CERVICAL SPINE WO CONTRAST  Result Date: 04/09/2023 CLINICAL DATA:  History of myasthenia gravis, progressive weakness and tremors, history of cervical myelopathy EXAM: MRI HEAD WITHOUT CONTRAST MRI CERVICAL SPINE WITHOUT CONTRAST TECHNIQUE: Multiplanar, multiecho pulse sequences of the brain and surrounding structures, and cervical spine, to include the craniocervical junction and cervicothoracic junction, were obtained without intravenous contrast. COMPARISON:  10/14/2022 MRI head, correlation is also made with 04/08/2023 CT head; no prior MRI of the cervical spine, correlation is made with 12/11/2019 CT cervical spine FINDINGS: MRI HEAD FINDINGS Brain: No restricted diffusion to suggest acute or subacute infarct. No acute hemorrhage, mass, mass effect,  or midline shift. No hydrocephalus or extra-axial collection. Partial empty sella. Craniocervical junction within normal limits. No hemosiderin deposition to suggest remote hemorrhage. T2 hyperintense signal in the periventricular white matter, likely the sequela of mild chronic small vessel ischemic disease. Vascular: Normal arterial flow voids. Skull and upper cervical spine: Normal marrow signal. Sinuses/Orbits: Clear paranasal sinuses. No acute finding in the orbits. Status post bilateral lens replacements. Other: The mastoid air cells are well aerated. MRI CERVICAL SPINE FINDINGS Alignment: No listhesis. Vertebrae: No acute fracture, evidence of discitis, or suspicious osseous lesion. Cord: Normal  signal and morphology. Posterior Fossa, vertebral arteries, paraspinal tissues: Negative. Disc levels: C2-C3: No significant disc bulge. Left facet arthropathy. No spinal canal stenosis or neuroforaminal narrowing. C3-C4: No significant disc bulge. Facet arthropathy. No spinal canal stenosis. Mild left neural foraminal narrowing. C4-C5: Mild disc bulge. Facet arthropathy. Ligamentum flavum hypertrophy. Mild-to-moderate spinal canal stenosis. Mild right neural foraminal narrowing. C5-C6: No significant disc bulge. Facet and uncovertebral hypertrophy. No spinal canal stenosis. Mild bilateral neural foraminal narrowing. C6-C7: Mild disc bulge. Minimal disc bulge. No spinal canal stenosis or neural foraminal narrowing. C7-T1: No significant disc bulge. Right facet and uncovertebral hypertrophy. No spinal canal stenosis. Mild right neural foraminal narrowing. IMPRESSION: 1. No acute intracranial process. No evidence of acute or subacute infarct. 2. C4-C5 mild-to-moderate spinal canal stenosis and mild right neural foraminal narrowing. 3. Additional mild neural foraminal narrowing on the left at C3-C4, bilaterally at C5-C6, and on the right at C7-T1. Electronically Signed   By: Wiliam Ke M.D.   On: 04/09/2023 01:48   DG Chest Port 1 View  Result Date: 04/08/2023 CLINICAL DATA:  Weakness. EXAM: PORTABLE CHEST 1 VIEW COMPARISON:  Chest radiograph dated 03/23/2021 and CT dated 12/03/2022. FINDINGS: The heart size and mediastinal contours are within normal limits. Both lungs are clear. The visualized skeletal structures are unremarkable. IMPRESSION: No active disease. Electronically Signed   By: Elgie Collard M.D.   On: 04/08/2023 22:58   CT Head Wo Contrast  Result Date: 04/08/2023 CLINICAL DATA:  Neurologic deficit. EXAM: CT HEAD WITHOUT CONTRAST TECHNIQUE: Contiguous axial images were obtained from the base of the skull through the vertex without intravenous contrast. RADIATION DOSE REDUCTION: This exam  was performed according to the departmental dose-optimization program which includes automated exposure control, adjustment of the mA and/or kV according to patient size and/or use of iterative reconstruction technique. COMPARISON:  Head CT dated 03/23/2021. FINDINGS: Brain: Mild age-related atrophy and chronic microvascular ischemic changes. There is no acute intracranial hemorrhage. No mass effect or midline shift. No extra-axial fluid collection. Vascular: No hyperdense vessel or unexpected calcification. Skull: Normal. Negative for fracture or focal lesion. Sinuses/Orbits: No acute finding. Other: None IMPRESSION: 1. No acute intracranial pathology. 2. Mild age-related atrophy and chronic microvascular ischemic changes. Electronically Signed   By: Elgie Collard M.D.   On: 04/08/2023 22:21    Pending Labs Unresulted Labs (From admission, onward)     Start     Ordered   04/08/23 2218  Urinalysis, w/ Reflex to Culture (Infection Suspected) -Urine, Clean Catch  (Urine Culture)  Once,   R       Question:  Specimen Source  Answer:  Urine, Clean Catch   04/08/23 2217            Vitals/Pain Today's Vitals   04/09/23 0615 04/09/23 0746 04/09/23 0800 04/09/23 0920  BP:   (!) 143/74   Pulse: 60  60   Resp: 14  14  Temp:  97.9 F (36.6 C)    TempSrc:  Oral    SpO2: 93%  95%   Weight:      Height:      PainSc:    0-No pain    Isolation Precautions No active isolations  Medications Medications  omega-3 acid ethyl esters (LOVAZA) capsule 2 g (2 g Oral Given 04/09/23 0915)  phenazopyridine (PYRIDIUM) tablet 100 mg (has no administration in time range)  azaTHIOprine (IMURAN) tablet 100 mg (has no administration in time range)  gabapentin (NEURONTIN) capsule 100 mg (100 mg Oral Given 04/09/23 0311)  pyridostigmine (MESTINON) tablet 60 mg (60 mg Oral Given 04/09/23 0915)  hydrALAZINE (APRESOLINE) injection 5 mg (has no administration in time range)  enoxaparin (LOVENOX) injection 40 mg  (40 mg Subcutaneous Given 04/09/23 0311)  insulin aspart (novoLOG) injection 0-9 Units ( Subcutaneous Not Given 04/09/23 0843)  insulin aspart (novoLOG) injection 0-5 Units ( Subcutaneous Not Given 04/09/23 0302)  calcium-vitamin D (OSCAL WITH D) 500-5 MG-MCG per tablet 1 tablet (1 tablet Oral Given 04/09/23 0915)  multivitamin (PROSIGHT) tablet 1 tablet (has no administration in time range)  albuterol (PROVENTIL) (2.5 MG/3ML) 0.083% nebulizer solution 2.5 mg (has no administration in time range)  acetaminophen (TYLENOL) tablet 650 mg (has no administration in time range)  ondansetron (ZOFRAN) injection 4 mg (has no administration in time range)  polyethylene glycol (MIRALAX / GLYCOLAX) packet 17 g (has no administration in time range)  melatonin tablet 5 mg (has no administration in time range)  sodium chloride 0.9 % bolus 500 mL (500 mLs Intravenous Bolus 04/08/23 2016)  cyanocobalamin (VITAMIN B12) injection 1,000 mcg (1,000 mcg Intramuscular Given 04/09/23 0915)    Mobility walks with person assist     Focused Assessments Neuro Assessment Handoff:  Swallow screen pass? Yes  Cardiac Rhythm: Normal sinus rhythm       Neuro Assessment: Within Defined Limits Neuro Checks:      Has TPA been given? No If patient is a Neuro Trauma and patient is going to OR before floor call report to 4N Charge nurse: [phone] or [phone]   R Recommendations: See Admitting Provider Note  Report given to:   Additional Notes: pt become dizzy when ambulating needs one assist to the bathroom, no pain complaints at this time.

## 2023-04-09 NOTE — Consult Note (Signed)
NEUROLOGY CONSULT NOTE   Date of service: April 09, 2023 Patient Name: Jessica Mclean MRN:  [id number] DOB:  Aug 16, 1945 Chief Complaint: "Dizziness, holding onto walls, walking like she is drunk" Requesting Provider: Burnadette Pop, MD  History of Present Illness  Valley Giove is a 77 y.o. female with history of myasthenia on maintenance IVIG, diabetes mellitus, hyperlipidemia, hypertension, obstructive sleep apnea who presents with multiple complaints including falls, having to hold onto walls encounters and objects when walking, feeling dizzy along with weakness in her legs and unsteadiness.  We discussed the time course of her symptoms and she reports that she has been having falls for a long time.  She reports that they have been going on for at least 6 months.  She reports that back in June she was having a lot of trouble swallowing and her voice was very soft and whispery.  She saw Dr. Serita Grit neurology and was diagnosed with myasthenia gravis and was treated with daily IVIG for 5 days with significant improvement of her symptoms and she was about 80% back to herself.  She reports that she has been on maintenance IVIG and getting about once a month in addition has been on medication including prednisone and more recently on azathioprine.  She reports that over the last couple months, she has been feeling more unsteady.  She denies any recurrence of difficulty swallowing or chewing or any droopiness of her eyelids or soft/whispering voice.  She feels that she has trouble walking in the dark.  She tells me that 1 day, several months ago, she woke up and she had intense room spinning, was unable to lift her head up or do anything.  She called her daughter who is a physician who recommended she go to the ER. This happened a short time after she got vaccinated. She reports that this eventually went away.  She is a vegan. Does not smoke, does not drink EtOH. Endorses hx of DM2 and has  peripheral neuropoathy that was initially in the bottom of her right foot and then went into the left foot.    ROS  Comprehensive ROS performed and pertinent positives documented in HPI   Past History   Past Medical History:  Diagnosis Date   Bleeding disorder (HCC)    Cervical cancer (HCC) 2010   Diabetes mellitus without complication (HCC)    type 2   Diabetes mellitus without complication (HCC)    Heart murmur    High cholesterol    HLD (hyperlipidemia)    Hypertension    Mental disorder    depression; suicidal ideation in 2017   Sleep apnea    Vitamin D deficiency     Past Surgical History:  Procedure Laterality Date   BILATERAL OOPHORECTOMY     CESAREAN SECTION     x 4   CHOLECYSTECTOMY     COLOSTOMY     x2   CYSTOSCOPY WITH URETHRAL DILATATION N/A 11/15/2020   Procedure: CYSTOSCOPY WITH URETHRAL DILATION;  Surgeon: Malen Gauze, MD;  Location: [facility];  Service: Urology;  Laterality: N/A;   SALPINGECTOMY     TONSILLECTOMY     TONSILLECTOMY AND ADENOIDECTOMY      Family History: Family History  Problem Relation Age of Onset   Cancer Father        kidney   Cancer Mother        lung   Other Brother        heart issues    Social  History  reports that she has never smoked. She has never used smokeless tobacco. She reports that she does not drink alcohol and does not use drugs.  Allergies  Allergen Reactions   Other     Nickel staples   Poison Ivy Treatments     Poison Ivy     Medications   Current Facility-Administered Medications:    acetaminophen (TYLENOL) tablet 650 mg, 650 mg, Oral, Q6H PRN, Elgergawy, Dawood S, MD   albuterol (PROVENTIL) (2.5 MG/3ML) 0.083% nebulizer solution 2.5 mg, 2.5 mg, Nebulization, Q6H PRN, Elgergawy, Leana Roe, MD   azaTHIOprine (IMURAN) tablet 100 mg, 100 mg, Oral, Daily, Elgergawy, Leana Roe, MD, 100 mg at 04/09/23 1003   calcium-vitamin D (OSCAL WITH D) 500-5 MG-MCG per tablet 1 tablet, 1 tablet, Oral, Q  breakfast, Elgergawy, Leana Roe, MD, 1 tablet at 04/09/23 0915   cyanocobalamin (VITAMIN B12) injection 1,000 mcg, 1,000 mcg, Intramuscular, Daily **FOLLOWED BY** [START ON 04/14/2023] cyanocobalamin (VITAMIN B12) tablet 1,000 mcg, 1,000 mcg, Oral, Daily, Derry Lory, Laderius Valbuena, MD   enoxaparin (LOVENOX) injection 40 mg, 40 mg, Subcutaneous, QHS, Elgergawy, Leana Roe, MD, 40 mg at 04/09/23 6962   gabapentin (NEURONTIN) capsule 100 mg, 100 mg, Oral, QHS, Elgergawy, Leana Roe, MD, 100 mg at 04/09/23 9528   hydrALAZINE (APRESOLINE) injection 5 mg, 5 mg, Intravenous, Q4H PRN, Elgergawy, Leana Roe, MD   insulin aspart (novoLOG) injection 0-5 Units, 0-5 Units, Subcutaneous, QHS, Elgergawy, Leana Roe, MD   insulin aspart (novoLOG) injection 0-9 Units, 0-9 Units, Subcutaneous, TID WC, Elgergawy, Leana Roe, MD, 1 Units at 04/09/23 1233   melatonin tablet 5 mg, 5 mg, Oral, QHS PRN, Elgergawy, Leana Roe, MD   multivitamin (PROSIGHT) tablet 1 tablet, 1 tablet, Oral, Daily, Elgergawy, Leana Roe, MD   omega-3 acid ethyl esters (LOVAZA) capsule 2 g, 2 g, Oral, BID, Elgergawy, Leana Roe, MD, 2 g at 04/09/23 0915   ondansetron (ZOFRAN) injection 4 mg, 4 mg, Intravenous, Q6H PRN, Elgergawy, Leana Roe, MD   phenazopyridine (PYRIDIUM) tablet 100 mg, 100 mg, Oral, TID PRN, Elgergawy, Leana Roe, MD   polyethylene glycol (MIRALAX / GLYCOLAX) packet 17 g, 17 g, Oral, Daily PRN, Elgergawy, Leana Roe, MD   predniSONE (DELTASONE) tablet 10 mg, 10 mg, Oral, Q breakfast, Hazeline Junker B, MD, 10 mg at 04/09/23 1213   pyridostigmine (MESTINON) tablet 60 mg, 60 mg, Oral, TID, Elgergawy, Leana Roe, MD, 60 mg at 04/09/23 0915   thiamine (VITAMIN B1) 500 mg in sodium chloride 0.9 % 50 mL IVPB, 500 mg, Intravenous, Daily, Tyrone Nine, MD  Vitals   Vitals:   04/09/23 0800 04/09/23 0900 04/09/23 0930 04/09/23 1109  BP: (!) 143/74 (!) 136/59 130/63 (!) 161/69  Pulse: 60 63 60 63  Resp: 14 15 12 20   Temp:    98.3 F (36.8 C)  TempSrc:      SpO2:  95% 93% 98% 96%  Weight:      Height:        Body mass index is 21.19 kg/m.  Physical Exam   General: Laying comfortably in bed; in no acute distress.  HENT: Normal oropharynx and mucosa. Normal external appearance of ears and nose.  Neck: Supple, no pain or tenderness  CV: No JVD. No peripheral edema.  Pulmonary: Symmetric Chest rise. Normal respiratory effort.  Abdomen: Soft to touch, non-tender.  Ext: No cyanosis, edema, or deformity  Skin: No rash. Normal palpation of skin.   Musculoskeletal: Normal digits and nails by inspection. No clubbing.  Neurologic Examination  Mental status/Cognition: Alert, oriented to self, place, month and year, good attention.  Speech/language: Fluent, comprehension intact, object naming intact, repetition intact.  Cranial nerves:   CN II Pupils equal and reactive to light, no VF deficits    CN III,IV,VI EOM intact, no gaze preference or deviation, no nystagmus    CN V normal sensation in V1, V2, and V3 segments bilaterally    CN VII no asymmetry, no nasolabial fold flattening    CN VIII normal hearing to speech    CN IX & X normal palatal elevation, no uvular deviation    CN XI 5/5 head turn and 5/5 shoulder shrug bilaterally    CN XII midline tongue protrusion    Motor:  Muscle bulk: normal, tone normal, pronator drift none tremor none Neck flexion: 4+/5 Neck extension: 4+/5. Mvmt Root Nerve  Muscle Right Left Comments  SA C5/6 Ax Deltoid 4+ 4+   EF C5/6 Mc Biceps 5 5   EE C6/7/8 Rad Triceps 4+ 4+   WF C6/7 Med FCR     WE C7/8 PIN ECU     F Ab C8/T1 U ADM/FDI 5 5   HF L1/2/3 Fem Illopsoas 4+ 4+ Difficulty walking upstairs and getting out of recliner  KE L2/3/4 Fem Quad 5 5   DF L4/5 D Peron Tib Ant 5 5   PF S1/2 Tibial Grc/Sol 5 5    Reflexes:  Right Left Comments  Pectoralis      Biceps (C5/6) 2 2   Brachioradialis (C5/6) 2 2    Triceps (C6/7) 2 2    Patellar (L3/4) 2 2    Achilles (S1) 1 1    Hoffman      Plantar withdraws  withdraws   Jaw jerk    Sensation:  Light touch Intact throughout   Pin prick Intact throughout   Temperature Intact throughout   Vibration Absent in bilateral feet up to the midshin  Proprioception Absent in bilateral big toes   Coordination/Complex Motor:  - Finger to Nose intact bilaterally - Heel to shin intact bilaterally - Rapid alternating movement are normal - Gait: Deferred for patient's safety.  Labs/Imaging/Neurodiagnostic studies   CBC:  Recent Labs  Lab 26-Apr-2023 2005 04/09/23 0311  WBC 6.1 4.7  NEUTROABS 5.3  --   HGB 11.4* 9.5*  HCT 36.1 30.7*  MCV 87.6 90.0  PLT 315 296   Basic Metabolic Panel:  Lab Results  Component Value Date   NA 137 04/09/2023   K 3.5 04/09/2023   CO2 24 04/09/2023   GLUCOSE 136 (H) 04/09/2023   BUN 25 (H) 04/09/2023   CREATININE 0.82 04/09/2023   CALCIUM 9.1 04/09/2023   GFRNONAA >60 04/09/2023   GFRAA >60 12/11/2019   Lipid Panel:  Lab Results  Component Value Date   LDLCALC 105 (H) 09/17/2022   HgbA1c:  Lab Results  Component Value Date   HGBA1C 6.7 (H) 04-26-2023   Urine Drug Screen:     Component Value Date/Time   LABOPIA NONE DETECTED 11/23/2019 0133   COCAINSCRNUR NONE DETECTED 11/23/2019 0133   LABBENZ NONE DETECTED 11/23/2019 0133   AMPHETMU NONE DETECTED 11/23/2019 0133   THCU NONE DETECTED 11/23/2019 0133   LABBARB NONE DETECTED 11/23/2019 0133    Alcohol Level     Component Value Date/Time   ETH <10 12/11/2019 1652   INR  Lab Results  Component Value Date   INR 1.0 12/11/2019   APTT  Lab Results  Component Value  Date   APTT 26 11/23/2019   AED levels: No results found for: "PHENYTOIN", "ZONISAMIDE", "LAMOTRIGINE", "LEVETIRACETA"  MR Brain without contrast(Personally reviewed): 1. No acute intracranial process. No evidence of acute or subacute infarct. 2. C4-C5 mild-to-moderate spinal canal stenosis and mild right neural foraminal narrowing. 3. Additional mild neural foraminal  narrowing on the left at C3-C4, bilaterally at C5-C6, and on the right at C7-T1.  MRI C spine w/o C(Personally reviewed): No cord compression  Vit B12: low.  ASSESSMENT   Aleyssa Palomar is a 77 y.o. female with hx of MG, DM2, HTN, HLD, obstructive sleep apnea who presents with feeling off balance, holding onto walls while walking and mild BL neck flexion/extension weakness, mild hip flexion weakness and mild shoulder abduction weakness.  She also has absent proprioception and vibratory sensation in bilateral feet.  Overall, her presentation seems most consistent with a combination of mild myasthenia exacerbation as well as severe vitamin B12 deficiency.  She is vegan, rarely takes her B12 supplements which definitely puts her at increased risk of B12 deficiency.  Daughter also mentions poor eating habits and weight loss.  As for her myasthenia exacerbation, I reviewed her medications with her and it seems like she has not been taking her Mestinon/pyridostigmine.  Upon looking into this further it seems that this was sent to [facility] in [facility].  However, patient does not have a car and she tells me all of her medications are mailed to her by [facility] and she does not go to [facility] for her medications.  Reviewing dispense report shows that the last time she got any Mestinon from [facility] was in December 20, 2022.  Given her myasthenia symptoms are so mild at this time, and the fact that she does not have any difficulty chewing, swallowing, hypophonic voice that she had with her prior MG flare up, I think it is reasonable to just put her back on Mestinon 60 mg 3 times daily and observe how she is doing.  She was already started on Mestinon 60 3 times daily and is already reporting improvement in her symptoms over the last day.  RECOMMENDATIONS  - Vit B12 replacement IM daily x 5 doses, followed by PO daily at discharge. - Pyridostigmine 60mg  TID started with reported  improvement already. She was not taking this at home due to it being sent to the wrong pharmacy. I would recommend sending this to her mail order pharmacy at discharge. - continue home prednisone and Azathioprine. - Every 8 hours NIFs and VC. If stable, can be spaced out tomorrow AM. - Medications that may worsen or trigger MG exacerbation: Class IA antiarrhythmics, magnesium, flouroquinolones, macrolides, aminoglycosides, penicillamine, curare, interferon alpha, botox, quinine. Use with caution: calcium channel blocker, beta blockers and statins. - Vit B1, Folate, B6. Started on PO Multivitamin once levels collected.  ______________________________________________________________________  Plan discussed with patient at the bedside and with her daughter Dr. Ian Malkin over phone and with the hospitalist team over secure chat.  Signed, Erick Blinks, MD [facility]

## 2023-04-09 NOTE — Progress Notes (Signed)
  Echocardiogram 2D Echocardiogram has been performed.  Ocie Doyne RDCS 04/09/2023, 3:23 PM

## 2023-04-09 NOTE — Telephone Encounter (Signed)
Pt called in and left a message with the access nurse on 04/08/23 at 6:00 PM. She states she is experiencing passing out and she cannot walk, hands shaking, and she is bouncing off the walls. She woke up laying on the kitchen floor.  Full access nurse is in Dr. Adaline Sill box

## 2023-04-09 NOTE — Progress Notes (Signed)
OT Cancellation Note  Patient Details Name: Jessica Mclean MRN: [id number] DOB: 04-Dec-1945   Cancelled Treatment:    Reason Eval/Treat Not Completed: Other (comment). Other staff are in patient's room gathering information.  Lindon Romp OT Acute Rehabilitation Services Office [phone]    Evette Georges 04/09/2023, 2:19 PM

## 2023-04-09 NOTE — Telephone Encounter (Signed)
Called pt at 830am and she reported that she is at the ER in [place] and would like Dr. Jorge Ny opinion on what the Drs would like to do ( Dialysis treatment) I talked to Dr. Loleta Chance , he reported to call her and tell her to let them care for her needs. I called Jessica Mclean back and reported per Dr. Loleta Chance and she understood. She just is not wanting to do Dialysis at this time she fills the medicine that dr. Loleta Chance put her on is working. They are moving her to [facility] today.

## 2023-04-09 NOTE — ED Notes (Signed)
Per hospitalist  pt is too weak and uncoordinated to perform orthostatic vitals on at this time

## 2023-04-09 NOTE — Progress Notes (Signed)
[facility] PROGRESS NOTE  Jessica Mclean (DOB: 14-Sep-1945) NGE:[id number] PCP: Anabel Halon, MD Outpatient Specialists: [facility], Dr. Loleta Chance  Brief Narrative: Jessica Mclean is a 77 y.o. female with a history of myasthenia gravis, cervical CA, TIA, HTN, HLD, OSA who presented to the ED on 04/08/2023 due to 2 weeks of progressive weakness, gait dysfunction and an episode of falling with loss of consciousness at home. Neurology was consulted for concern of myasthenia crisis, recommended transfer to [facility] for formal neurology evaluation and initiation of PLEX if no alternative etiologies found for her symptoms.   Subjective: No shortness of breath or fevers, symptoms of infection of late. Worried about her dogs at home and who will care for them.  Objective: BP (!) 136/59   Pulse 63   Temp 97.9 F (36.6 C) (Oral)   Resp 15   Ht 5\' 4"  (1.626 m)   Wt 56 kg   LMP  (LMP Unknown)   SpO2 93%   BMI 21.19 kg/m   Gen: Elderly pleasant oriented female in no distress Pulm: Clear, nonlabored, adequate excursions CV: RRR GI: Soft, NT, ND, +BS Neuro: Alert and oriented. Occasional word blocking and mild dysarthria. No definite ptosis. No focal strength deficits or numbness.   Ext: Warm, no deformities Skin: No rashes, lesions or ulcers on visualized skin   Assessment & Plan: Acute exacerbation of myasthenia gravis:  - Neurology recommendations appreciate, pending transfer to [facility] for formal evaluation. PLEX would be next step in management if alternative etiology for her symptoms.  - Continue pyridostigmine, azathioprine, and prednisone at current doses.  - Monitor NIF, VC serially (currently stable), plan to admit to progressive care unit.  - SLP, dysphagia diet as tolerated.  - Avoid beta blockers, FQs, neuromuscular blocking agents.   History of TIA: No acute findings on MRI brain/C spine.  Vitamin B12 deficiency: Supposed to be on oral supplementation she says but not taking  it. So not clearly autoimmune etiology.  - Supplement IM while here, then restart po.   Syncope:  - Continue cardiac monitoring, plan for echocardiogram   HTN:  - Holding antihypertensives in setting of syncope  T2DM:  - SSI  HLD:  - Continue lovaza, holding statin  Tyrone Nine, MD [facility] [email or web address] 04/09/2023, 9:36 AM

## 2023-04-09 NOTE — Evaluation (Signed)
Clinical/Bedside Swallow Evaluation Patient Details  Name: Jessica Mclean MRN: [id number] Date of Birth: [birth year]  Today's Date: 04/09/2023 Time: SLP Start Time (ACUTE ONLY): 1124 SLP Stop Time (ACUTE ONLY): 1143 SLP Time Calculation (min) (ACUTE ONLY): 19 min  Past Medical History:  Past Medical History:  Diagnosis Date   Bleeding disorder (HCC)    Cervical cancer (HCC) 2010   Diabetes mellitus without complication (HCC)    type 2   Diabetes mellitus without complication (HCC)    Heart murmur    High cholesterol    HLD (hyperlipidemia)    Hypertension    Mental disorder    depression; suicidal ideation in 2017   Sleep apnea    Vitamin D deficiency    Past Surgical History:  Past Surgical History:  Procedure Laterality Date   BILATERAL OOPHORECTOMY     CESAREAN SECTION     x 4   CHOLECYSTECTOMY     COLOSTOMY     x2   CYSTOSCOPY WITH URETHRAL DILATATION N/A 11/15/2020   Procedure: CYSTOSCOPY WITH URETHRAL DILATION;  Surgeon: Malen Gauze, MD;  Location: [facility];  Service: Urology;  Laterality: N/A;   SALPINGECTOMY     TONSILLECTOMY     TONSILLECTOMY AND ADENOIDECTOMY     HPI:  Jessica Mclean is a 77 yo female presenting to ED 11/20 with a two week history of progressive weakness, dysphagia, and an episode of falling with +LOC. Admitted for acute excerebration of myasthenia gravis, for which she is on IVIG at home. Seen by OP SLP most recently 10/2022 with primarily oral dysphagia. PMH includes HLD, HTN, TIA, cervical cancer, depression, cystitis-dysuria, myasthenia gravis with positive ACH R antibodies    Assessment / Plan / Recommendation  Clinical Impression  Pt reports her dysphagia has improved significantly since prior excaberation and she is now able to consume chopped solids. Her primary concern is related to a globus sensation experienced specifically with solids that she uses a liquid wash to clear. Oral motor exam WFL, although note missing top dentition.  Pt states she typically wears upper dentures when eating, although they are currently unavailable. Trials of thin liquids and purees were WFL with good oral clearance and no subjective reports of globus sensation. Trials of Dys 3 solids resulted in increased difficutly, requiring multiple swallows and throat clearance. Discussed options for diet modification with pt in agreement to downgrade to pureed soilds at this time. Recommend diet of Dys 1 texture solids with thin liquids. Meds can be given crushed in puree. SLP will continue following. SLP Visit Diagnosis: Dysphagia, unspecified (R13.10)    Aspiration Risk  Mild aspiration risk    Diet Recommendation Dysphagia 1 (Puree);Thin liquid    Liquid Administration via: Cup;Straw Medication Administration: Crushed with puree Supervision: Patient able to self feed;Intermittent supervision to cue for compensatory strategies Compensations: Minimize environmental distractions;Slow rate;Small sips/bites;Follow solids with liquid Postural Changes: Seated upright at 90 degrees;Remain upright for at least 30 minutes after po intake    Other  Recommendations Oral Care Recommendations: Oral care BID    Recommendations for follow up therapy are one component of a multi-disciplinary discharge planning process, led by the attending physician.  Recommendations may be updated based on patient status, additional functional criteria and insurance authorization.  Follow up Recommendations Outpatient SLP      Assistance Recommended at Discharge    Functional Status Assessment Patient has had a recent decline in their functional status and demonstrates the ability to make significant improvements in function in  a reasonable and predictable amount of time.  Frequency and Duration min 2x/week  1 week       Prognosis Prognosis for improved oropharyngeal function: Good Barriers to Reach Goals: Time post onset      Swallow Study   General HPI: Jessica Mclean  is a 77 yo female presenting to ED 11/20 with a two week history of progressive weakness, dysphagia, and an episode of falling with +LOC. Admitted for acute excerebration of myasthenia gravis, for which she is on IVIG at home. Seen by OP SLP most recently 10/2022 with primarily oral dysphagia. PMH includes HLD, HTN, TIA, cervical cancer, depression, cystitis-dysuria, myasthenia gravis with positive ACH R antibodies Type of Study: Bedside Swallow Evaluation Previous Swallow Assessment: see HPI Diet Prior to this Study: Dysphagia 2 (finely chopped);Mildly thick liquids (Level 2, nectar thick) Temperature Spikes Noted: No Respiratory Status: Room air History of Recent Intubation: No Behavior/Cognition: Alert;Cooperative;Pleasant mood Oral Cavity Assessment: Within Functional Limits Oral Care Completed by SLP: No Oral Cavity - Dentition: Missing dentition;Dentures, top;Dentures, not available Vision: Functional for self-feeding Self-Feeding Abilities: Able to feed self Patient Positioning: Upright in bed Baseline Vocal Quality: Normal Volitional Cough: Strong Volitional Swallow: Able to elicit    Oral/Motor/Sensory Function Overall Oral Motor/Sensory Function: Within functional limits   Ice Chips Ice chips: Not tested   Thin Liquid Thin Liquid: Within functional limits Presentation: Straw;Self Fed    Nectar Thick Nectar Thick Liquid: Not tested   Honey Thick Honey Thick Liquid: Not tested   Puree Puree: Within functional limits Presentation: Spoon;Self Fed   Solid     Solid: Impaired Presentation: Spoon;Self Fed Oral Phase Functional Implications: Oral residue Pharyngeal Phase Impairments: Throat Clearing - Immediate;Multiple swallows      [name], M.A., CF-SLP Speech Language Pathology, Acute Rehabilitation Services  Secure Chat preferred [phone]  04/09/2023,11:53 AM

## 2023-04-09 NOTE — Progress Notes (Signed)
Pt. Performed -30 on the NIF and 1.7L on the vital capacity.

## 2023-04-09 NOTE — Progress Notes (Signed)
RT instructed patient on NIF and FVC. Patient performed each maneuver and RT documented the  best of 3 attempts, patient performed >-40 on the NIF and 2.2L  the FVC.

## 2023-04-10 ENCOUNTER — Other Ambulatory Visit (HOSPITAL_COMMUNITY): Payer: Self-pay

## 2023-04-10 DIAGNOSIS — E1149 Type 2 diabetes mellitus with other diabetic neurological complication: Secondary | ICD-10-CM

## 2023-04-10 DIAGNOSIS — I10 Essential (primary) hypertension: Secondary | ICD-10-CM | POA: Diagnosis not present

## 2023-04-10 DIAGNOSIS — G7001 Myasthenia gravis with (acute) exacerbation: Secondary | ICD-10-CM | POA: Diagnosis not present

## 2023-04-10 DIAGNOSIS — E1169 Type 2 diabetes mellitus with other specified complication: Secondary | ICD-10-CM

## 2023-04-10 DIAGNOSIS — E785 Hyperlipidemia, unspecified: Secondary | ICD-10-CM

## 2023-04-10 LAB — GLUCOSE, CAPILLARY
Glucose-Capillary: 187 mg/dL — ABNORMAL HIGH (ref 70–99)
Glucose-Capillary: 174 mg/dL — ABNORMAL HIGH (ref 70–99)
Glucose-Capillary: 164 mg/dL — ABNORMAL HIGH (ref 70–99)

## 2023-04-10 MED ORDER — CYANOCOBALAMIN 1000 MCG PO TABS
1000.0000 ug | ORAL_TABLET | Freq: Every day | ORAL | 0 refills | Status: DC
  Filled 2023-04-10: qty 15, 15d supply, fill #0

## 2023-04-10 MED ORDER — PYRIDOSTIGMINE BROMIDE 60 MG PO TABS
60.0000 mg | ORAL_TABLET | Freq: Three times a day (TID) | ORAL | 0 refills | Status: DC
  Filled 2023-04-10: qty 90, 30d supply, fill #0

## 2023-04-10 NOTE — Care Management Important Message (Signed)
Important Message  Patient Details  Name: Jessica Mclean MRN: [id number] Date of Birth: 01/26/1946   Important Message Given:  Yes - Medicare IM     Sherilyn Banker 04/10/2023, 3:45 PM

## 2023-04-10 NOTE — Hospital Course (Addendum)
[name] was admitted to the hospital with the working diagnosis of myasthenia gravis exacerbation.   77 yo female with the past medical history of hypertension, hyperlipidemia, TIA, cervical cancer, depression, vitamin D deficiency and myasthenia gravid with presented with weakness. Progressive weakness over last 2 weeks prior to admission. On the day of admission she had a fall with loss of consciousness prompting a call to EMS. She was transported to the ED. On her initial physical examination her blood pressure was 132/84, HR 96, RR 18 and 02 saturation 100%, lungs with no wheezing or rales, heart with S1 and S2 present and regular, abdomen with no distention and no lower extremity edema.   Na 135, K 3,8 Cl 104 bicarbonate 19, glucose 217, bun 26 cr 1,0  Wbc 6,1 hgb 11,4 plt 315  CT head with no acute intracranial changes. Mild age related atrophy and chronic microvascular ischemic changes.   Chest radiograph with mild hyperinflation with no infiltrates.   EKG 89 bpm, normal axis, right bundle branch block, sinus rhythm with no significant ST segment or T wave changes.   Neurology was consulted with recommendations to get brain and C spine MRI, which had no acute changes.   11/21 transferred to [facility] from [facility] for further neurology evaluation.  Her NIF and VC remained stable.   Patient was started on pyridostigmine with improvement in her symptoms.  11/22 patient continue to improve her symptoms, plan to follow up as outpatient.

## 2023-04-10 NOTE — Assessment & Plan Note (Addendum)
Plan to continue mertformin, semaglutide.  Capillary glucose monitoring as outpatient.   Continue atorvastatin.

## 2023-04-10 NOTE — Assessment & Plan Note (Signed)
Continue blood pressure control with amlodipine and telmisartan. Follow up with primary care as outpatient.

## 2023-04-10 NOTE — Assessment & Plan Note (Signed)
Patient had improvement in her symptoms, apparently she did not have pyridostigmine at home.  Her NIF is - 40 and vital capacity 1,6 L with good patient effort.   Plan to continue pyridostigmine, prednisone and azathioprine.  Follow up with neurology as outpatient.

## 2023-04-10 NOTE — Progress Notes (Signed)
Patient's IV was taken out, catheter tip intact. Patient was given her 2 prescriptions from [facility] pharmacy & put in her personal belonging bag. I went over discharge paperwork with the patient and called the [employer]. Patient was taken down to her taxi in a wheelchair by the nurse tech.

## 2023-04-10 NOTE — Discharge Summary (Addendum)
Physician Discharge Summary   Patient: Jessica Mclean MRN: [id number] DOB: 02-Dec-1945  Admit date:     04/08/2023  Discharge date: 04/10/23  Discharge Physician: York Ram Sascha Baugher   PCP: Anabel Halon, MD   Recommendations at discharge:    Patient will resume taking pyridostigmine 60 mg po bid.  Added vitamin B12 and follow up levels as outpatient.  Follow up with Dr Allena Katz in 7 to 10 days.  Follow up with Neurology as outpatient.   Medications that may worsen or trigger MG exacerbation: Class IA antiarrhythmics, magnesium, flouroquinolones, macrolides, aminoglycosides, penicillamine, curare, interferon alpha, botox, quinine. Use with caution: calcium channel blocker, beta blockers and statins.   Discharge Diagnoses: Principal Problem:   Myasthenia gravis with (acute) exacerbation (HCC) Active Problems:   Essential hypertension   Type 2 diabetes mellitus with hyperlipidemia (HCC)   Diabetic neuropathy (HCC)   Chronic cystitis  Resolved Problems:   * No resolved hospital problems. [facility] Course: Jessica Mclean was admitted to the hospital with the working diagnosis of myasthenia gravis exacerbation.   77 yo female with the past medical history of hypertension, hyperlipidemia, TIA, cervical cancer, depression, vitamin D deficiency and myasthenia gravid with presented with weakness. Progressive weakness over last 2 weeks prior to admission. On the day of admission she had a fall with loss of consciousness prompting a call to EMS. She was transported to the ED. On her initial physical examination her blood pressure was 132/84, HR 96, RR 18 and 02 saturation 100%, lungs with no wheezing or rales, heart with S1 and S2 present and regular, abdomen with no distention and no lower extremity edema.   Na 135, K 3,8 Cl 104 bicarbonate 19, glucose 217, bun 26 cr 1,0  Wbc 6,1 hgb 11,4 plt 315  CT head with no acute intracranial changes. Mild age related atrophy and chronic  microvascular ischemic changes.   Chest radiograph with mild hyperinflation with no infiltrates.   EKG 89 bpm, normal axis, right bundle branch block, sinus rhythm with no significant ST segment or T wave changes.   Neurology was consulted with recommendations to get brain and C spine MRI, which had no acute changes.   11/21 transferred to [facility] from [facility] for further neurology evaluation.  Her NIF and VC remained stable.   Patient was started on pyridostigmine with improvement in her symptoms.  11/22 patient continue to improve her symptoms, plan to follow up as outpatient.   Assessment and Plan: * Myasthenia gravis with (acute) exacerbation (HCC) Patient had improvement in her symptoms, apparently she did not have pyridostigmine at home.  Her NIF is - 40 and vital capacity 1,6 L with good patient effort.   Plan to continue pyridostigmine, prednisone and azathioprine.  Follow up with neurology as outpatient.   Essential hypertension Continue blood pressure control with amlodipine and telmisartan. Follow up with primary care as outpatient.   Type 2 diabetes mellitus with hyperlipidemia (HCC) Plan to continue mertformin, semaglutide.  Capillary glucose monitoring as outpatient.   Continue atorvastatin.   Diabetic neuropathy (HCC) Continue with gabapentin.   Chronic cystitis Continue with phenazopyridine.         Consultants: Neurology  Procedures performed: none   Disposition: Home Diet recommendation:  Cardiac and Carb modified diet DISCHARGE MEDICATION: Allergies as of 04/10/2023       Reactions   Other    Nickel staples   Poison Ivy Treatments    Poison [employer]  Medication List     TAKE these medications    Accu-Chek Guide test strip Generic drug: glucose blood USE TO CHECK BLOOD SUGAR 4 TIMES DAILY   Accu-Chek Guide w/Device Kit   Accu-Chek Softclix Lancets lancets USE TO CHECK BLOOD SUGAR 4 TIMES DAILY   albuterol 108 (90 Base) MCG/ACT  inhaler Commonly known as: VENTOLIN HFA Inhale 2 puffs into the lungs every 6 (six) hours as needed for wheezing or shortness of breath.   amLODipine 10 MG tablet Commonly known as: NORVASC TAKE 1 TABLET BY MOUTH DAILY   atorvastatin 80 MG tablet Commonly known as: LIPITOR TAKE 1 TABLET BY MOUTH DAILY   azaTHIOprine 50 MG tablet Commonly known as: Imuran Take 2 tablets (100 mg total) by mouth daily. Take 1 tablet (50 mg) for 1 week, then increase to 2 tablets (100 mg) thereafter.   blood glucose meter kit and supplies Dispense based on patient and insurance preference. Use up to four times daily as directed. (FOR ICD-10 E10.9, E11.9).   calcium-vitamin D 500-400 MG-UNIT tablet Commonly known as: OSCAL-500 Take 1 tablet by mouth daily.   cyanocobalamin 1000 MCG tablet Commonly known as: VITAMIN B12 Take 1 tablet (1,000 mcg total) by mouth daily for 15 days. Start taking on: April 14, 2023   Dexcom G7 Receiver Hardie Pulley USE TO CHECK BLOOD GLUCOSE 3  TIMES DAILY BEFORE MEALS, AT  BEDTIME AND AS NEEDED   Dexcom G7 Sensor Misc USE TO CHECK BLOOD GLUCOSE 3  TIMES DAILY BEFORE MEALS, AT  BEDTIME AND AS NEEDED   Excedrin Extra Strength 250-250-65 MG tablet Generic drug: aspirin-acetaminophen-caffeine Take 2 tablets by mouth in the morning.   gabapentin 100 MG capsule Commonly known as: NEURONTIN TAKE 1 CAPSULE BY MOUTH AT  BEDTIME   meclizine 25 MG tablet Commonly known as: ANTIVERT Take 1 tablet (25 mg total) by mouth 3 (three) times daily as needed for dizziness.   metFORMIN 1000 MG tablet Commonly known as: GLUCOPHAGE TAKE 1 TABLET BY MOUTH TWICE  DAILY WITH MEALS   omega-3 acid ethyl esters 1 g capsule Commonly known as: LOVAZA Take 2 g by mouth 2 (two) times daily.   phenazopyridine 100 MG tablet Commonly known as: Pyridium Take 1 tablet (100 mg total) by mouth 3 (three) times daily as needed for pain (Dysuria).   predniSONE 10 MG tablet Commonly known as:  DELTASONE Take 1 tablet (10 mg total) by mouth daily with breakfast. Take 2 tablets (20 mg) daily   PRESERVISION AREDS PO Take 2 capsules by mouth daily.   pyridostigmine 60 MG tablet Commonly known as: Mestinon Take 1 tablet (60 mg total) by mouth 3 (three) times daily.   Semaglutide (1 MG/DOSE) 4 MG/3ML Sopn Inject 1 mg as directed once a week.   telmisartan 40 MG tablet Commonly known as: MICARDIS TAKE 1 TABLET BY MOUTH DAILY        Discharge Exam: Filed Weights   04/08/23 1856  Weight: 56 kg   BP (!) 151/86 (BP Location: Left Arm)   Pulse 81   Temp 99.9 F (37.7 C) (Oral)   Resp 15   Ht 5\' 4"  (1.626 m)   Wt 56 kg   LMP  (LMP Unknown)   SpO2 94%   BMI 21.19 kg/m   Patient awake and alert, no chest pain or dyspnea, her overall strength has improved.  Neurology awake and alert ENT with mild pallor Cardiovascular with S1 and S2 present and regular with no gallops, rubs or  murmurs Respiratory with no rales or wheezing, no rhonchi Abdomen with no distention  No lower extremity edema   Condition at discharge: stable  The results of significant diagnostics from this hospitalization (including imaging, microbiology, ancillary and laboratory) are listed below for reference.   Imaging Studies: ECHOCARDIOGRAM COMPLETE  Result Date: 04/09/2023    ECHOCARDIOGRAM REPORT   Patient Name:   Kathee Hutcheson Date of Exam: 04/09/2023 Medical Rec #:  [id number]      Height:       64.0 in Accession #:    [id number]     Weight:       123.5 lb Date of Birth:  09-03-1945      BSA:          1.594 m Patient Age:    76 years       BP:           161/69 mmHg Patient Gender: F              HR:           68 bpm. Exam Location:  Inpatient Procedure: 2D Echo, Cardiac Doppler and Color Doppler Indications:    Syncope  History:        Patient has prior history of Echocardiogram examinations, most                 recent 11/23/2019. Risk Factors:Hypertension, Diabetes,                 Dyslipidemia  and Sleep Apnea.  Sonographer:    JLS Referring Phys: Starleen Arms IMPRESSIONS  1. Left ventricular ejection fraction, by estimation, is 60 to 65%. The left ventricle has normal function. The left ventricle has no regional wall motion abnormalities. Left ventricular diastolic parameters were normal.  2. Right ventricular systolic function is normal. The right ventricular size is normal. Tricuspid regurgitation signal is inadequate for assessing PA pressure.  3. The mitral valve is degenerative. No evidence of mitral valve regurgitation. No evidence of mitral stenosis.  4. The aortic valve was not well visualized. Aortic valve regurgitation is trivial. Aortic valve sclerosis/calcification is present, without any evidence of aortic stenosis.  5. The inferior vena cava is normal in size with greater than 50% respiratory variability, suggesting right atrial pressure of 3 mmHg. FINDINGS  Left Ventricle: Left ventricular ejection fraction, by estimation, is 60 to 65%. The left ventricle has normal function. The left ventricle has no regional wall motion abnormalities. The left ventricular internal cavity size was normal in size. There is  no left ventricular hypertrophy. Left ventricular diastolic parameters were normal. Normal left ventricular filling pressure. Right Ventricle: The right ventricular size is normal. No increase in right ventricular wall thickness. Right ventricular systolic function is normal. Tricuspid regurgitation signal is inadequate for assessing PA pressure. Left Atrium: Left atrial size was normal in size. Right Atrium: Right atrial size was normal in size. Pericardium: There is no evidence of pericardial effusion. Mitral Valve: The mitral valve is degenerative in appearance. There is mild calcification of the mitral valve leaflet(s). Mild mitral annular calcification. No evidence of mitral valve regurgitation. No evidence of mitral valve stenosis. MV peak gradient, 3.1 mmHg. The mean mitral  valve gradient is 1.0 mmHg. Tricuspid Valve: The tricuspid valve is normal in structure. Tricuspid valve regurgitation is not demonstrated. No evidence of tricuspid stenosis. Aortic Valve: The aortic valve was not well visualized. Aortic valve regurgitation is trivial. Aortic valve sclerosis/calcification is present, without any evidence of  aortic stenosis. Aortic valve mean gradient measures 5.0 mmHg. Aortic valve peak gradient measures 11.0 mmHg. Aortic valve area, by VTI measures 1.98 cm. Pulmonic Valve: The pulmonic valve was normal in structure. Pulmonic valve regurgitation is not visualized. No evidence of pulmonic stenosis. Aorta: The aortic root is normal in size and structure. Venous: The inferior vena cava is normal in size with greater than 50% respiratory variability, suggesting right atrial pressure of 3 mmHg. IAS/Shunts: No atrial level shunt detected by color flow Doppler.  LEFT VENTRICLE PLAX 2D LVIDd:         4.10 cm     Diastology LVIDs:         2.90 cm     LV e' medial:    7.94 cm/s LV PW:         0.80 cm     LV E/e' medial:  10.6 LV IVS:        0.70 cm     LV e' lateral:   7.51 cm/s LVOT diam:     1.80 cm     LV E/e' lateral: 11.2 LV SV:         60 LV SV Index:   37 LVOT Area:     2.54 cm  LV Volumes (MOD) LV vol d, MOD A2C: 70.8 ml LV vol d, MOD A4C: 83.7 ml LV vol s, MOD A2C: 27.2 ml LV vol s, MOD A4C: 31.1 ml LV SV MOD A2C:     43.6 ml LV SV MOD A4C:     83.7 ml LV SV MOD BP:      48.0 ml RIGHT VENTRICLE             IVC RV Basal diam:  3.00 cm     IVC diam: 1.60 cm RV Mid diam:    1.90 cm RV S prime:     12.60 cm/s TAPSE (M-mode): 2.8 cm LEFT ATRIUM             Index        RIGHT ATRIUM           Index LA diam:        2.40 cm 1.51 cm/m   RA Area:     13.60 cm LA Vol (A2C):   33.4 ml 20.96 ml/m  RA Volume:   33.60 ml  21.08 ml/m LA Vol (A4C):   26.4 ml 16.57 ml/m LA Biplane Vol: 31.3 ml 19.64 ml/m  AORTIC VALVE                     PULMONIC VALVE AV Area (Vmax):    1.78 cm      PV Vmax:        1.06 m/s AV Area (Vmean):   1.73 cm      PV Peak grad:  4.5 mmHg AV Area (VTI):     1.98 cm AV Vmax:           166.00 cm/s AV Vmean:          105.000 cm/s AV VTI:            0.300 m AV Peak Grad:      11.0 mmHg AV Mean Grad:      5.0 mmHg LVOT Vmax:         116.00 cm/s LVOT Vmean:        71.300 cm/s LVOT VTI:          0.234 m LVOT/AV VTI ratio: 0.78  AORTA  Ao Root diam: 3.10 cm MITRAL VALVE MV Area (PHT): 3.42 cm    SHUNTS MV Area VTI:   2.19 cm    Systemic VTI:  0.23 m MV Peak grad:  3.1 mmHg    Systemic Diam: 1.80 cm MV Mean grad:  1.0 mmHg MV Vmax:       0.88 m/s MV Vmean:      53.4 cm/s MV Decel Time: 222 msec MV E velocity: 83.90 cm/s MV A velocity: 93.80 cm/s MV E/A ratio:  0.89 Armanda Magic MD Electronically signed by Armanda Magic MD Signature Date/Time: 04/09/2023/3:32:47 PM    Final    MR BRAIN WO CONTRAST  Result Date: 04/09/2023 CLINICAL DATA:  History of myasthenia gravis, progressive weakness and tremors, history of cervical myelopathy EXAM: MRI HEAD WITHOUT CONTRAST MRI CERVICAL SPINE WITHOUT CONTRAST TECHNIQUE: Multiplanar, multiecho pulse sequences of the brain and surrounding structures, and cervical spine, to include the craniocervical junction and cervicothoracic junction, were obtained without intravenous contrast. COMPARISON:  10/14/2022 MRI head, correlation is also made with 04/08/2023 CT head; no prior MRI of the cervical spine, correlation is made with 12/11/2019 CT cervical spine FINDINGS: MRI HEAD FINDINGS Brain: No restricted diffusion to suggest acute or subacute infarct. No acute hemorrhage, mass, mass effect, or midline shift. No hydrocephalus or extra-axial collection. Partial empty sella. Craniocervical junction within normal limits. No hemosiderin deposition to suggest remote hemorrhage. T2 hyperintense signal in the periventricular white matter, likely the sequela of mild chronic small vessel ischemic disease. Vascular: Normal arterial flow voids. Skull and upper  cervical spine: Normal marrow signal. Sinuses/Orbits: Clear paranasal sinuses. No acute finding in the orbits. Status post bilateral lens replacements. Other: The mastoid air cells are well aerated. MRI CERVICAL SPINE FINDINGS Alignment: No listhesis. Vertebrae: No acute fracture, evidence of discitis, or suspicious osseous lesion. Cord: Normal signal and morphology. Posterior Fossa, vertebral arteries, paraspinal tissues: Negative. Disc levels: C2-C3: No significant disc bulge. Left facet arthropathy. No spinal canal stenosis or neuroforaminal narrowing. C3-C4: No significant disc bulge. Facet arthropathy. No spinal canal stenosis. Mild left neural foraminal narrowing. C4-C5: Mild disc bulge. Facet arthropathy. Ligamentum flavum hypertrophy. Mild-to-moderate spinal canal stenosis. Mild right neural foraminal narrowing. C5-C6: No significant disc bulge. Facet and uncovertebral hypertrophy. No spinal canal stenosis. Mild bilateral neural foraminal narrowing. C6-C7: Mild disc bulge. Minimal disc bulge. No spinal canal stenosis or neural foraminal narrowing. C7-T1: No significant disc bulge. Right facet and uncovertebral hypertrophy. No spinal canal stenosis. Mild right neural foraminal narrowing. IMPRESSION: 1. No acute intracranial process. No evidence of acute or subacute infarct. 2. C4-C5 mild-to-moderate spinal canal stenosis and mild right neural foraminal narrowing. 3. Additional mild neural foraminal narrowing on the left at C3-C4, bilaterally at C5-C6, and on the right at C7-T1. Electronically Signed   By: Wiliam Ke M.D.   On: 04/09/2023 01:48   MR CERVICAL SPINE WO CONTRAST  Result Date: 04/09/2023 CLINICAL DATA:  History of myasthenia gravis, progressive weakness and tremors, history of cervical myelopathy EXAM: MRI HEAD WITHOUT CONTRAST MRI CERVICAL SPINE WITHOUT CONTRAST TECHNIQUE: Multiplanar, multiecho pulse sequences of the brain and surrounding structures, and cervical spine, to include the  craniocervical junction and cervicothoracic junction, were obtained without intravenous contrast. COMPARISON:  10/14/2022 MRI head, correlation is also made with 04/08/2023 CT head; no prior MRI of the cervical spine, correlation is made with 12/11/2019 CT cervical spine FINDINGS: MRI HEAD FINDINGS Brain: No restricted diffusion to suggest acute or subacute infarct. No acute hemorrhage, mass, mass effect, or  midline shift. No hydrocephalus or extra-axial collection. Partial empty sella. Craniocervical junction within normal limits. No hemosiderin deposition to suggest remote hemorrhage. T2 hyperintense signal in the periventricular white matter, likely the sequela of mild chronic small vessel ischemic disease. Vascular: Normal arterial flow voids. Skull and upper cervical spine: Normal marrow signal. Sinuses/Orbits: Clear paranasal sinuses. No acute finding in the orbits. Status post bilateral lens replacements. Other: The mastoid air cells are well aerated. MRI CERVICAL SPINE FINDINGS Alignment: No listhesis. Vertebrae: No acute fracture, evidence of discitis, or suspicious osseous lesion. Cord: Normal signal and morphology. Posterior Fossa, vertebral arteries, paraspinal tissues: Negative. Disc levels: C2-C3: No significant disc bulge. Left facet arthropathy. No spinal canal stenosis or neuroforaminal narrowing. C3-C4: No significant disc bulge. Facet arthropathy. No spinal canal stenosis. Mild left neural foraminal narrowing. C4-C5: Mild disc bulge. Facet arthropathy. Ligamentum flavum hypertrophy. Mild-to-moderate spinal canal stenosis. Mild right neural foraminal narrowing. C5-C6: No significant disc bulge. Facet and uncovertebral hypertrophy. No spinal canal stenosis. Mild bilateral neural foraminal narrowing. C6-C7: Mild disc bulge. Minimal disc bulge. No spinal canal stenosis or neural foraminal narrowing. C7-T1: No significant disc bulge. Right facet and uncovertebral hypertrophy. No spinal canal stenosis.  Mild right neural foraminal narrowing. IMPRESSION: 1. No acute intracranial process. No evidence of acute or subacute infarct. 2. C4-C5 mild-to-moderate spinal canal stenosis and mild right neural foraminal narrowing. 3. Additional mild neural foraminal narrowing on the left at C3-C4, bilaterally at C5-C6, and on the right at C7-T1. Electronically Signed   By: Wiliam Ke M.D.   On: 04/09/2023 01:48   DG Chest Port 1 View  Result Date: 04/08/2023 CLINICAL DATA:  Weakness. EXAM: PORTABLE CHEST 1 VIEW COMPARISON:  Chest radiograph dated 03/23/2021 and CT dated 12/03/2022. FINDINGS: The heart size and mediastinal contours are within normal limits. Both lungs are clear. The visualized skeletal structures are unremarkable. IMPRESSION: No active disease. Electronically Signed   By: Elgie Collard M.D.   On: 04/08/2023 22:58   CT Head Wo Contrast  Result Date: 04/08/2023 CLINICAL DATA:  Neurologic deficit. EXAM: CT HEAD WITHOUT CONTRAST TECHNIQUE: Contiguous axial images were obtained from the base of the skull through the vertex without intravenous contrast. RADIATION DOSE REDUCTION: This exam was performed according to the departmental dose-optimization program which includes automated exposure control, adjustment of the mA and/or kV according to patient size and/or use of iterative reconstruction technique. COMPARISON:  Head CT dated 03/23/2021. FINDINGS: Brain: Mild age-related atrophy and chronic microvascular ischemic changes. There is no acute intracranial hemorrhage. No mass effect or midline shift. No extra-axial fluid collection. Vascular: No hyperdense vessel or unexpected calcification. Skull: Normal. Negative for fracture or focal lesion. Sinuses/Orbits: No acute finding. Other: None IMPRESSION: 1. No acute intracranial pathology. 2. Mild age-related atrophy and chronic microvascular ischemic changes. Electronically Signed   By: Elgie Collard M.D.   On: 04/08/2023 22:21     Microbiology: Results for orders placed or performed in visit on 04/07/22  Microscopic Examination     Status: Abnormal   Collection Time: 04/07/22  3:55 PM   Urine  Result Value Ref Range Status   WBC, UA 0-5 0 - 5 /hpf Final   RBC, Urine None seen 0 - 2 /hpf Final   Epithelial Cells (non renal) 0-10 0 - 10 /hpf Final   Casts Present (A) None seen /lpf Final   Cast Type Hyaline casts N/A Final   Bacteria, UA None seen None seen/Few Final    Labs: CBC: Recent Labs  Lab 04/08/23 2005 04/09/23 0311  WBC 6.1 4.7  NEUTROABS 5.3  --   HGB 11.4* 9.5*  HCT 36.1 30.7*  MCV 87.6 90.0  PLT 315 296   Basic Metabolic Panel: Recent Labs  Lab 04/08/23 2005 04/09/23 0311  NA 135 137  K 3.8 3.5  CL 104 106  CO2 19* 24  GLUCOSE 217* 136*  BUN 26* 25*  CREATININE 1.00 0.82  CALCIUM 9.8 9.1   Liver Function Tests: Recent Labs  Lab 04/08/23 2005  AST 20  ALT 12  ALKPHOS 52  BILITOT 0.7  PROT 8.4*  ALBUMIN 4.3   CBG: Recent Labs  Lab 04/09/23 1203 04/09/23 1625 04/09/23 2106 04/10/23 0759 04/10/23 1210  GLUCAP 130* 201* 165* 187* 174*    Discharge time spent: greater than 30 minutes.  Signed: Coralie Keens, MD [facility] 04/10/2023

## 2023-04-10 NOTE — Progress Notes (Signed)
NIF -40, VC 1.6L with good patient effort.

## 2023-04-10 NOTE — Evaluation (Signed)
Occupational Therapy Evaluation Patient Details Name: Jessica Mclean MRN: [id number] DOB: [birth year] Today's Date: 04/10/2023   History of Present Illness [name] is a 77 yo female presenting to ED 11/20 with a two week history of progressive weakness, dysphagia, and an episode of falling with +LOC. Admitted for acute excerebration of myasthenia gravis, for which she is on IVIG at home. PMHx:HLD, HTN, TIA, cervical cancer, depression, cystitis-dysuria, myasthenia gravis with positive ACH R antibodies   Clinical Impression   Patient admitted for the diagnosis above.  PTA she lives at home alone, her daughter will be moving to [place] this Saturday.  Patient cares for 3 large dogs, and no longer drives.  Occasional use of cane.  Currently she is unsteady, and is not receptive to assist, but is very nice.  CGA for in room mobility, and reaches for objects in her environment.  CGA for ADL completion from sit to stand level.  Patient wants to return home, not completely receptive to post acute rehab.  OT will follow in the acute setting to address deficits.         If plan is discharge home, recommend the following: Assist for transportation    Functional Status Assessment  Patient has had a recent decline in their functional status and demonstrates the ability to make significant improvements in function in a reasonable and predictable amount of time.  Equipment Recommendations  None recommended by OT    Recommendations for Other Services       Precautions / Restrictions Precautions Precautions: Fall Restrictions Weight Bearing Restrictions: No      Mobility Bed Mobility Overal bed mobility: Independent                  Transfers Overall transfer level: Needs assistance Equipment used: None Transfers: Sit to/from Stand, Bed to chair/wheelchair/BSC Sit to Stand: Contact guard assist     Step pivot transfers: Contact guard assist     General transfer comment:  unsteady, reaching for objects in her environment.      Balance Overall balance assessment: Needs assistance Sitting-balance support: Feet supported Sitting balance-Leahy Scale: Good     Standing balance support: No upper extremity supported Standing balance-Leahy Scale: Fair Standing balance comment: fair to poor, would benefit from AD                           ADL either performed or assessed with clinical judgement   ADL                       Lower Body Dressing: Contact guard assist;Sit to/from stand   Toilet Transfer: Contact guard assist;Rolling walker (2 wheels)                   Vision Baseline Vision/History: 1 Wears glasses Patient Visual Report: No change from baseline       Perception Perception: Within Functional Limits       Praxis Praxis: WFL       Pertinent Vitals/Pain Pain Assessment Pain Assessment: No/denies pain     Extremity/Trunk Assessment Upper Extremity Assessment Upper Extremity Assessment: Overall WFL for tasks assessed   Lower Extremity Assessment Lower Extremity Assessment: Defer to PT evaluation   Cervical / Trunk Assessment Cervical / Trunk Assessment: Normal   Communication Communication Communication: No apparent difficulties   Cognition Arousal: Alert Behavior During Therapy: WFL for tasks assessed/performed Overall Cognitive Status: Within Functional Limits for tasks assessed  General Comments: Will lose train of thought.     General Comments       Exercises     Shoulder Instructions      Home Living Family/patient expects to be discharged to:: Private residence Living Arrangements: Alone Available Help at Discharge: Family;Available PRN/intermittently Type of Home: House Home Access: Stairs to enter [employer] of Steps: 5 Entrance Stairs-Rails: Right;Left;Can reach both Home Layout: One level     Bathroom Shower/Tub:  Chief Strategy Officer: Standard Bathroom Accessibility: Yes How Accessible: Accessible via walker Home Equipment: Rollator (4 wheels);Cane - quad;Cane - single point          Prior Functioning/Environment Prior Level of Function : Independent/Modified Independent             Mobility Comments: Occasional use of cane ADLs Comments: No longer drives, cares for 3 dogs.  Daughter is moving to [place]. for a job.  Patient will have no family in the area.        OT Problem List: Impaired balance (sitting and/or standing);Decreased safety awareness      OT Treatment/Interventions: Self-care/ADL training;Therapeutic activities;Patient/family education;Balance training;DME and/or AE instruction    OT Goals(Current goals can be found in the care plan section) Acute Rehab OT Goals Patient Stated Goal: Return home, hopefully today OT Goal Formulation: With patient Time For Goal Achievement: 04/24/23 Potential to Achieve Goals: Good ADL Goals Pt Will Perform Lower Body Dressing: with modified independence;sit to/from stand Pt Will Transfer to Toilet: with modified independence;ambulating;regular height toilet  OT Frequency: Min 1X/week    Co-evaluation              AM-PAC OT "6 Clicks" Daily Activity     Outcome Measure Help from another person eating meals?: None Help from another person taking care of personal grooming?: None Help from another person toileting, which includes using toliet, bedpan, or urinal?: A Little Help from another person bathing (including washing, rinsing, drying)?: A Little Help from another person to put on and taking off regular upper body clothing?: None Help from another person to put on and taking off regular lower body clothing?: A Little 6 Click Score: 21   End of Session Equipment Utilized During Treatment: Gait belt Nurse Communication: Mobility status  Activity Tolerance: Patient tolerated treatment well Patient left: in  bed;with call bell/phone within reach;with chair alarm set  OT Visit Diagnosis: Unsteadiness on feet (R26.81)                Time: 1610-9604 OT Time Calculation (min): 23 min Charges:  OT General Charges $OT Visit: 1 Visit OT Evaluation $OT Eval Moderate Complexity: 1 Mod OT Treatments $Self Care/Home Management : 8-22 mins  04/10/2023  RP, OTR/L  Acute Rehabilitation Services  Office:  [phone]   Suzanna Obey 04/10/2023, 11:20 AM

## 2023-04-10 NOTE — Assessment & Plan Note (Signed)
Continue with gabapentin.

## 2023-04-10 NOTE — Evaluation (Signed)
Physical Therapy Evaluation Patient Details Name: Jessica Mclean MRN: [id number] DOB: 08/04/1945 Today's Date: 04/10/2023  History of Present Illness  Jessica Mclean is a 77 yo female presenting to ED 11/20 with a two week history of progressive weakness, dysphagia, and an episode of falling with +LOC. Admitted for acute excerebration of myasthenia gravis, for which she is on IVIG at home. PMHx:HLD, HTN, TIA, cervical cancer, depression, cystitis-dysuria, myasthenia gravis with positive ACH R antibodies  Clinical Impression   Pt presents with impaired balance with history of multiple falls, impaired strength, and decreased activity tolerance. Pt to benefit from acute PT to address deficits. Pt ambulated good hallway distance without AD, does reach out for environment and not very receptive to safety cues regarding this or fall risk. Pt would benefit from OPPT for balance and strength, pt likely to decline this. PT to progress mobility as tolerated, and will continue to follow acutely.          If plan is discharge home, recommend the following: A little help with walking and/or transfers;A little help with bathing/dressing/bathroom   Can travel by private vehicle        Equipment Recommendations None recommended by PT  Recommendations for Other Services       Functional Status Assessment Patient has had a recent decline in their functional status and demonstrates the ability to make significant improvements in function in a reasonable and predictable amount of time.     Precautions / Restrictions Precautions Precautions: Fall Restrictions Weight Bearing Restrictions: No      Mobility  Bed Mobility Overal bed mobility: Needs Assistance Bed Mobility: Supine to Sit, Sit to Supine     Supine to sit: Supervision Sit to supine: Supervision   General bed mobility comments: safety    Transfers Overall transfer level: Needs assistance Equipment used: None Transfers: Sit  to/from Stand Sit to Stand: Supervision           General transfer comment: pt declines PT CGA stating "no, let me"    Ambulation/Gait Ambulation/Gait assistance: Supervision Gait Distance (Feet): 250 Feet Assistive device: None Gait Pattern/deviations: Step-through pattern, Decreased stride length, Drifts right/left Gait velocity: decr     General Gait Details: drifts L/R and does not accept assist, reaches for environment to self-steady  Stairs Stairs: Yes Stairs assistance: Contact guard assist Stair Management: Forwards, Alternating pattern, Step to pattern Number of Stairs: 10 General stair comments: step-to for descent, alternating stepping for ascending. Cues for slowing down, use of bedrails  Wheelchair Mobility     Tilt Bed    Modified Rankin (Stroke Patients Only)       Balance Overall balance assessment: Needs assistance, History of Falls Sitting-balance support: Feet supported Sitting balance-Leahy Scale: Good     Standing balance support: No upper extremity supported Standing balance-Leahy Scale: Fair Standing balance comment: multiple falls at home, reaches for environment to self-steady and does not accept feedback                             Pertinent Vitals/Pain Pain Assessment Pain Assessment: No/denies pain    Home Living Family/patient expects to be discharged to:: Private residence Living Arrangements: Alone Available Help at Discharge: Family;Available PRN/intermittently Type of Home: House Home Access: Stairs to enter Entrance Stairs-Rails: Right;Left;Can reach both Entrance Stairs-Number of Steps: 5   Home Layout: One level Home Equipment: Rollator (4 wheels);Cane - quad;Cane - single point      Prior  Function Prior Level of Function : Independent/Modified Independent             Mobility Comments: Occasional use of cane ADLs Comments: No longer drives, cares for 3 dogs.  Daughter is moving to [place]. for a job.   Patient will have no family in the area.     Extremity/Trunk Assessment   Upper Extremity Assessment Upper Extremity Assessment: Defer to OT evaluation    Lower Extremity Assessment Lower Extremity Assessment: Generalized weakness    Cervical / Trunk Assessment Cervical / Trunk Assessment: Normal  Communication   Communication Communication: No apparent difficulties  Cognition Arousal: Alert Behavior During Therapy: WFL for tasks assessed/performed Overall Cognitive Status: Within Functional Limits for tasks assessed                                          General Comments      Exercises     Assessment/Plan    PT Assessment Patient needs continued PT services  PT Problem List Decreased strength;Decreased mobility;Decreased activity tolerance;Decreased balance;Decreased knowledge of use of DME;Decreased safety awareness;Decreased coordination       PT Treatment Interventions DME instruction;Therapeutic activities;Gait training;Therapeutic exercise;Patient/family education;Balance training;Stair training;Functional mobility training;Neuromuscular re-education    PT Goals (Current goals can be found in the Care Plan section)  Acute Rehab PT Goals Patient Stated Goal: go home PT Goal Formulation: With patient Time For Goal Achievement: 04/24/23 Potential to Achieve Goals: Good    Frequency Min 1X/week     Co-evaluation               AM-PAC PT "6 Clicks" Mobility  Outcome Measure Help needed turning from your back to your side while in a flat bed without using bedrails?: A Little Help needed moving from lying on your back to sitting on the side of a flat bed without using bedrails?: A Little Help needed moving to and from a bed to a chair (including a wheelchair)?: A Little Help needed standing up from a chair using your arms (e.g., wheelchair or bedside chair)?: A Little Help needed to walk in hospital room?: A Little Help needed climbing  3-5 steps with a railing? : A Little 6 Click Score: 18    End of Session   Activity Tolerance: Patient tolerated treatment well Patient left: with call bell/phone within reach;in bed;with bed alarm set Nurse Communication: Mobility status PT Visit Diagnosis: Other abnormalities of gait and mobility (R26.89);Muscle weakness (generalized) (M62.81)    Time: 8295-6213 PT Time Calculation (min) (ACUTE ONLY): 15 min   Charges:   PT Evaluation $PT Eval Low Complexity: 1 Low   PT General Charges $$ ACUTE PT VISIT: 1 Visit         Marye Round, PT DPT Acute Rehabilitation Services Secure Chat Preferred  Office [phone]   Aladdin Kollmann Sheliah Plane 04/10/2023, 4:40 PM

## 2023-04-10 NOTE — Assessment & Plan Note (Signed)
Continue with phenazopyridine.

## 2023-04-10 NOTE — Progress Notes (Signed)
Speech Language Pathology Treatment: Dysphagia  Patient Details Name: Jessica Mclean MRN: [id number] DOB: November 05, 1945 Today's Date: 04/10/2023 Time: 9528-4132 SLP Time Calculation (min) (ACUTE ONLY): 12 min  Assessment / Plan / Recommendation Clinical Impression  Symptoms that pt reported during yesterday's swallow evaluation have dissipated. She reports improvements; was able to masticate and swallow trials of crackers/peanut butter this am with no oral residue, nor reports of globus.  Pt concerned that pureed diet is too limiting. Recommend advancing to regular solids to offer her more choices.  Overall, she is tolerating POs, including water, well today. No s/s of aspiration.  No further acute SLP needs identified. Our service will sign off.   HPI HPI: Jessica Mclean is a 77 yo female presenting to ED 11/20 with a two week history of progressive weakness, dysphagia, and an episode of falling with +LOC. Admitted for acute excerebration of myasthenia gravis, for which she is on IVIG at home. Seen by OP SLP most recently 10/2022 with primarily oral dysphagia. PMH includes HLD, HTN, TIA, cervical cancer, depression, cystitis-dysuria, myasthenia gravis with positive ACH R antibodies      SLP Plan  All goals met      Recommendations for follow up therapy are one component of a multi-disciplinary discharge planning process, led by the attending physician.  Recommendations may be updated based on patient status, additional functional criteria and insurance authorization.    Recommendations  Diet recommendations: Regular;Thin liquid Liquids provided via: Cup;Straw Medication Administration: Whole meds with puree Supervision: Patient able to self feed                  Oral care BID     Dysphagia, unspecified (R13.10)     All goals met    Tanny Harnack L. Samson Frederic, MA CCC/SLP Clinical Specialist - Acute Care SLP Acute Rehabilitation Services Office number [phone]  Blenda Mounts  Laurice  04/10/2023, 11:29 AM

## 2023-04-11 LAB — VITAMIN B6: Vitamin B6: 2.9 ug/L — ABNORMAL LOW (ref 3.4–65.2)

## 2023-04-12 LAB — VITAMIN B1: Vitamin B1 (Thiamine): 86.7 nmol/L (ref 66.5–200.0)

## 2023-04-13 ENCOUNTER — Telehealth: Payer: Self-pay

## 2023-04-13 NOTE — Transitions of Care (Post Inpatient/ED Visit) (Signed)
   04/13/2023  Name: Jessica Mclean MRN: [id number] DOB: 10/17/1945  Today's TOC FU Call Status: Today's TOC FU Call Status:: Unsuccessful Call (1st Attempt) Unsuccessful Call (1st Attempt) Date: 04/13/23  Attempted to reach the patient regarding the most recent Inpatient/ED visit.  Follow Up Plan: Additional outreach attempts will be made to reach the patient to complete the Transitions of Care (Post Inpatient/ED visit) call.   Lonia Chimera, RN, BSN, CEN [employer]- Transition of Care Team.  [facility] [phone]

## 2023-04-14 ENCOUNTER — Telehealth: Payer: Self-pay

## 2023-04-14 NOTE — Transitions of Care (Post Inpatient/ED Visit) (Signed)
   04/14/2023  Name: Jessica Mclean MRN: [id number] DOB: 1946/02/21  Today's TOC FU Call Status: Today's TOC FU Call Status:: Unsuccessful Call (2nd Attempt) Unsuccessful Call (2nd Attempt) Date: 04/14/23 (placed call to patient. she answered. I reviewed the reason for my call . Patient reports that she feels terrible and can not talk.)  Attempted to reach the patient regarding the most recent Inpatient/ED visit.  Follow Up Plan: Additional outreach attempts will be made to reach the patient to complete the Transitions of Care (Post Inpatient/ED visit) call.   Lonia Chimera, RN, BSN, CEN [employer]- Transition of Care Team.  [facility] [phone]

## 2023-04-15 ENCOUNTER — Telehealth: Payer: Self-pay

## 2023-04-15 NOTE — Transitions of Care (Post Inpatient/ED Visit) (Signed)
   04/15/2023  Name: Jessica Mclean MRN: [id number] DOB: 01-15-1946  Today's TOC FU Call Status: Today's TOC FU Call Status:: Unsuccessful Call (3rd Attempt) Unsuccessful Call (3rd Attempt) Date: 04/15/23  Attempted to reach the patient regarding the most recent Inpatient/ED visit.  Follow Up Plan: No further outreach attempts will be made at this time. We have been unable to contact the patient.  Lonia Chimera, RN, BSN, CEN [employer]- Transition of Care Team.  [facility] [phone]

## 2023-04-22 ENCOUNTER — Encounter: Payer: Self-pay | Admitting: Nurse Practitioner

## 2023-04-22 ENCOUNTER — Ambulatory Visit (INDEPENDENT_AMBULATORY_CARE_PROVIDER_SITE_OTHER): Payer: 59 | Admitting: Nurse Practitioner

## 2023-04-22 VITALS — BP 134/70 | HR 61 | Ht 64.5 in | Wt 124.6 lb

## 2023-04-22 DIAGNOSIS — Z7984 Long term (current) use of oral hypoglycemic drugs: Secondary | ICD-10-CM | POA: Diagnosis not present

## 2023-04-22 DIAGNOSIS — E119 Type 2 diabetes mellitus without complications: Secondary | ICD-10-CM | POA: Diagnosis not present

## 2023-04-22 DIAGNOSIS — Z7985 Long-term (current) use of injectable non-insulin antidiabetic drugs: Secondary | ICD-10-CM | POA: Diagnosis not present

## 2023-04-22 NOTE — Progress Notes (Signed)
Endocrinology Consult Note       04/22/2023, 10:22 AM   Subjective:    Patient ID: Jessica Mclean, female    DOB: [birth year].  Jessica Mclean is being seen in consultation for management of currently uncontrolled symptomatic diabetes requested by  Anabel Halon, MD.  She was previously seen by Dr. Fransico Him here in this clinic in 2021 but stopped coming due to copay costs.   Past Medical History:  Diagnosis Date   Bleeding disorder (HCC)    Cervical cancer (HCC) 2010   Diabetes mellitus without complication (HCC)    type 2   Diabetes mellitus without complication (HCC)    Heart murmur    High cholesterol    HLD (hyperlipidemia)    Hypertension    Mental disorder    depression; suicidal ideation in 2017   Sleep apnea    Vitamin D deficiency     Past Surgical History:  Procedure Laterality Date   BILATERAL OOPHORECTOMY     CESAREAN SECTION     x 4   CHOLECYSTECTOMY     COLOSTOMY     x2   CYSTOSCOPY WITH URETHRAL DILATATION N/A 11/15/2020   Procedure: CYSTOSCOPY WITH URETHRAL DILATION;  Surgeon: Malen Gauze, MD;  Location: [facility];  Service: Urology;  Laterality: N/A;   SALPINGECTOMY     TONSILLECTOMY     TONSILLECTOMY AND ADENOIDECTOMY      Social History   Socioeconomic History   Marital status: Divorced    Spouse name: Not on file   Number of children: 3   Years of education: Not on file   Highest education level: Not on file  Occupational History   Occupation: retired  Tobacco Use   Smoking status: Never   Smokeless tobacco: Never  Vaping Use   Vaping status: Never Used  Substance and Sexual Activity   Alcohol use: Never   Drug use: Never   Sexual activity: Not Currently    Birth control/protection: Post-menopausal  Other Topics Concern   Not on file  Social History Narrative   ** Merged History Encounter **    Right handed   Leave alone-1 level home   Caffeine--soda/coffee        Are you currently employed ?    What is your current occupation? retired   Do you live at home alone?yes   Who lives with you?           Social Determinants of Health   Financial Resource Strain: Medium Risk (07/31/2020)   Overall Financial Resource Strain (CARDIA)    Difficulty of Paying Living Expenses: Somewhat hard  Food Insecurity: No Food Insecurity (04/09/2023)   Hunger Vital Sign    Worried About Running Out of Food in the Last Year: Never true    Ran Out of Food in the Last Year: Never true  Transportation Needs: No Transportation Needs (04/09/2023)   PRAPARE - Administrator, Civil Service (Medical): No    Lack of Transportation (Non-Medical): No  Physical Activity: Insufficiently Active (07/31/2020)   Exercise Vital Sign    Days of Exercise per Week: 4 days    Minutes of Exercise per Session: 30 min  Stress: No Stress Concern Present (07/31/2020)   [employer] of Occupational Health - Occupational Stress Questionnaire    Feeling of Stress : Only a little  Social Connections: Unknown (10/01/2021)   [facility]   Social Network    Social Network: Not on file    Family History  Problem Relation Age of Onset   Cancer Father        kidney   Cancer Mother        lung   Other Brother        heart issues    Outpatient Encounter Medications as of 04/22/2023  Medication Sig   ACCU-CHEK GUIDE test strip USE TO CHECK BLOOD SUGAR 4 TIMES DAILY   Accu-Chek Softclix Lancets lancets USE TO CHECK BLOOD SUGAR 4 TIMES DAILY   albuterol (VENTOLIN HFA) 108 (90 Base) MCG/ACT inhaler Inhale 2 puffs into the lungs every 6 (six) hours as needed for wheezing or shortness of breath.   amLODipine (NORVASC) 10 MG tablet TAKE 1 TABLET BY MOUTH DAILY   aspirin-acetaminophen-caffeine (EXCEDRIN EXTRA STRENGTH) 250-250-65 MG tablet Take 2 tablets by mouth in the morning.   atorvastatin (LIPITOR) 80 MG tablet TAKE 1 TABLET BY MOUTH DAILY    azaTHIOprine (IMURAN) 50 MG tablet Take 2 tablets (100 mg total) by mouth daily. Take 1 tablet (50 mg) for 1 week, then increase to 2 tablets (100 mg) thereafter.   blood glucose meter kit and supplies Dispense based on patient and insurance preference. Use up to four times daily as directed. (FOR ICD-10 E10.9, E11.9).   Blood Glucose Monitoring Suppl (ACCU-CHEK GUIDE) w/Device KIT    calcium-vitamin D (OSCAL-500) 500-400 MG-UNIT tablet Take 1 tablet by mouth daily.   Continuous Glucose Receiver (DEXCOM G7 RECEIVER) DEVI USE TO CHECK BLOOD GLUCOSE 3  TIMES DAILY BEFORE MEALS, AT  BEDTIME AND AS NEEDED   gabapentin (NEURONTIN) 100 MG capsule TAKE 1 CAPSULE BY MOUTH AT  BEDTIME   meclizine (ANTIVERT) 25 MG tablet Take 1 tablet (25 mg total) by mouth 3 (three) times daily as needed for dizziness.   metFORMIN (GLUCOPHAGE) 1000 MG tablet TAKE 1 TABLET BY MOUTH TWICE  DAILY WITH MEALS   Multiple Vitamins-Minerals (PRESERVISION AREDS PO) Take 2 capsules by mouth daily.   omega-3 acid ethyl esters (LOVAZA) 1 g capsule Take 2 g by mouth 2 (two) times daily.   phenazopyridine (PYRIDIUM) 100 MG tablet Take 1 tablet (100 mg total) by mouth 3 (three) times daily as needed for pain (Dysuria).   predniSONE (DELTASONE) 10 MG tablet Take 1 tablet (10 mg total) by mouth daily with breakfast. Take 2 tablets (20 mg) daily   pyridostigmine (MESTINON) 60 MG tablet Take 1 tablet (60 mg total) by mouth 3 (three) times daily.   Semaglutide, 1 MG/DOSE, 4 MG/3ML SOPN Inject 1 mg as directed once a week.   telmisartan (MICARDIS) 40 MG tablet TAKE 1 TABLET BY MOUTH DAILY   Continuous Glucose Sensor (DEXCOM G7 SENSOR) MISC USE TO CHECK BLOOD GLUCOSE 3  TIMES DAILY BEFORE MEALS, AT  BEDTIME AND AS NEEDED (Patient not taking: Reported on 04/22/2023)   cyanocobalamin 1000 MCG tablet Take 1 tablet (1,000 mcg total) by mouth daily for 15 days. (Patient not taking: Reported on 04/22/2023)   No facility-administered encounter  medications on file as of 04/22/2023.    ALLERGIES: Allergies  Allergen Reactions   Other     Nickel staples   Poison Ivy Treatments     Poison  Ivy     VACCINATION STATUS: Immunization History  Administered Date(s) Administered   Fluad Quad(high Dose 65+) 04/05/2014, 03/06/2020, 01/15/2021, 04/15/2022   Fluad Trivalent(High Dose 65+) 01/15/2023   Influenza,inj,Quad PF,6+ Mos 04/05/2014   Influenza-Unspecified 02/19/1998, 05/05/2000, 04/05/2001, 03/09/2002, 03/06/2003, 04/02/2004, 03/31/2005, 05/04/2006, 03/15/2007, 03/02/2009, 02/18/2010, 02/13/2011   Moderna Sars-Covid-2 Vaccination 05/31/2019, 07/01/2019, 04/05/2020   PNEUMOCOCCAL CONJUGATE-20 04/15/2022   Pneumococcal Conjugate-13 03/02/2015   Pneumococcal Polysaccharide-23 03/06/2003, 09/10/2009   Tdap 12/27/2012   Zoster Recombinant(Shingrix) 03/10/2018, 06/22/2018    Diabetes She presents for Jessica Mclean initial diabetic visit. She has type 2 diabetes mellitus. Onset time: diagnosed at approx age of 50. Jessica Mclean disease course has been improving. There are no hypoglycemic associated symptoms. Pertinent negatives for diabetes include no weight loss. There are no hypoglycemic complications. Diabetic complications include a CVA (TIA) and peripheral neuropathy. Risk factors for coronary artery disease include diabetes mellitus, post-menopausal and sedentary lifestyle. Current diabetic treatment includes oral agent (monotherapy) (Ozempic). She is compliant with treatment most of the time. Jessica Mclean weight is fluctuating minimally. She is following a vegetarian (vegan) diet. When asked about meal planning, she reported none. She has not had a previous visit with a dietitian. She participates in exercise intermittently. (She presents today for Jessica Mclean consultation with no meter or logs to review.   She has CGM but does not know how to apply it and notes Jessica Mclean PCP did not know how to advise Jessica Mclean to apply it either.  Jessica Mclean most recent A1c on 04/08/23 was 6.7%.  She  drinks water and V8 vegetable juice and consumes a vegan diet, eating only 1 meal per day around 6pm.  She is not as active as she once was due to Jessica Mclean Myasthenia Gravis.  She does tend to fall a lot.  He is UTD on eye exam, has never been seen by podiatry in the past.  She did bring Jessica Mclean CGM supplies with Jessica Mclean for assistance with application.) An ACE inhibitor/angiotensin II receptor blocker is being taken. She does not see a podiatrist.Eye exam is current.     Review of systems  Constitutional: + Minimally fluctuating body weight, current Body mass index is 21.06 kg/m., no fatigue, no subjective hyperthermia, no subjective hypothermia Eyes: no blurry vision, no xerophthalmia ENT: no sore throat, no nodules palpated in throat, no dysphagia/odynophagia, no hoarseness Cardiovascular: no chest pain, no shortness of breath, no palpitations, no leg swelling Respiratory: no cough, no shortness of breath Gastrointestinal: no nausea/vomiting/diarrhea Musculoskeletal: no muscle/joint aches, + frequent falls due to MG Skin: no rashes, no hyperemia Neurological: no tremors, no numbness, no tingling, no dizziness Psychiatric: no depression, no anxiety  Objective:     BP 134/70 (BP Location: Right Arm, Patient Position: Sitting, Cuff Size: Large)   Pulse 61   Ht 5' 4.5" (1.638 m)   Wt 124 lb 9.6 oz (56.5 kg)   LMP  (LMP Unknown)   BMI 21.06 kg/m   Wt Readings from Last 3 Encounters:  04/22/23 124 lb 9.6 oz (56.5 kg)  04/08/23 123 lb 7.3 oz (56 kg)  01/15/23 123 lb 6.4 oz (56 kg)     BP Readings from Last 3 Encounters:  04/22/23 134/70  04/10/23 (!) 151/86  01/15/23 120/61     Physical Exam- Limited  Constitutional:  Body mass index is 21.06 kg/m. , not in acute distress, somewhat distracted state of mind, ? mild memory impairment (had trouble with word finding vs recall) Eyes:  EOMI, no exophthalmos Musculoskeletal: no gross deformities, strength intact  in all four extremities, no gross  restriction of joint movements Skin:  no rashes, no hyperemia Neurological: no tremor with outstretched hands   Diabetic Foot Exam - Simple   No data filed      CMP ( most recent) CMP     Component Value Date/Time   NA 137 04/09/2023 0311   NA 137 01/15/2023 1512   K 3.5 04/09/2023 0311   CL 106 04/09/2023 0311   CO2 24 04/09/2023 0311   GLUCOSE 136 (H) 04/09/2023 0311   BUN 25 (H) 04/09/2023 0311   BUN 29 (H) 01/15/2023 1512   CREATININE 0.82 04/09/2023 0311   CALCIUM 9.1 04/09/2023 0311   PROT 8.4 (H) 04/08/2023 2005   PROT 7.2 01/15/2023 1512   ALBUMIN 4.3 04/08/2023 2005   ALBUMIN 4.5 01/15/2023 1512   AST 20 04/08/2023 2005   ALT 12 04/08/2023 2005   ALKPHOS 52 04/08/2023 2005   BILITOT 0.7 04/08/2023 2005   BILITOT 0.8 01/15/2023 1512   EGFR 44 (L) 01/15/2023 1512   GFRNONAA >60 04/09/2023 0311     Diabetic Labs (most recent): Lab Results  Component Value Date   HGBA1C 6.7 (H) 04/08/2023   HGBA1C 7.3 (H) 01/15/2023   HGBA1C 5.7 (H) 09/17/2022     Lipid Panel ( most recent) Lipid Panel     Component Value Date/Time   CHOL 185 09/17/2022 1414   TRIG 159 (H) 09/17/2022 1414   HDL 52 09/17/2022 1414   CHOLHDL 3.6 09/17/2022 1414   CHOLHDL 5.7 11/23/2019 1504   VLDL 58 (H) 11/23/2019 1504   LDLCALC 105 (H) 09/17/2022 1414   LABVLDL 28 09/17/2022 1414      Lab Results  Component Value Date   TSH 0.484 04/08/2023   TSH 0.56 11/19/2022   TSH 1.220 04/15/2022   TSH 1.300 01/09/2021           Assessment & Plan:   1) Type 2 diabetes mellitus without complication, without long-term current use of insulin (HCC)    - Jessica Mclean has currently uncontrolled symptomatic type 2 DM since 77 years of age, with most recent A1c of 6.7 %.   -Recent labs reviewed.  - I had a long discussion with Jessica Mclean about the progressive nature of diabetes and the pathology behind its complications. -Jessica Mclean diabetes is complicated by TIA, chronic steroid use from MG  and she remains at a high risk for more acute and chronic complications which include CAD, CVA, CKD, retinopathy, and neuropathy. These are all discussed in detail with Jessica Mclean.  The following Lifestyle Medicine recommendations according to American College of Lifestyle Medicine [facility]) were discussed and offered to patient and she agrees to start the journey:  A. Whole Foods, Plant-based plate comprising of fruits and vegetables, plant-based proteins, whole-grain carbohydrates was discussed in detail with the patient.   A list for source of those nutrients were also provided to the patient.  Patient will use only water or unsweetened tea for hydration. B.  The need to stay away from risky substances including alcohol, smoking; obtaining 7 to 9 hours of restorative sleep, at least 150 minutes of moderate intensity exercise weekly, the importance of healthy social connections,  and stress reduction techniques were discussed. C.  A full color page of  Calorie density of various food groups per pound showing examples of each food groups was provided to the patient.  - I have counseled Jessica Mclean on diet and weight management by adopting a carbohydrate restricted/protein rich diet. Patient  is encouraged to switch to unprocessed or minimally processed complex starch and increased protein intake (animal or plant source), fruits, and vegetables. -  she is advised to stick to a routine mealtimes to eat 3 meals a day and avoid unnecessary snacks (to snack only to correct hypoglycemia).   - she acknowledges that there is a room for improvement in Jessica Mclean food and drink choices. - Suggestion is made for Jessica Mclean to avoid simple carbohydrates from Jessica Mclean diet including Cakes, Sweet Desserts, Ice Cream, Soda (diet and regular), Sweet Tea, Candies, Chips, Cookies, Store Bought Juices, Alcohol in Excess of 1-2 drinks a day, Artificial Sweeteners, Coffee Creamer, and "Sugar-free" Products. This will help patient to have more stable blood  glucose profile and potentially avoid unintended weight gain.  - I have approached Jessica Mclean with the following individualized plan to manage Jessica Mclean diabetes and patient agrees:    -she is encouraged to start/continue monitoring glucose at least once daily, before breakfast (using Jessica Mclean CGM), to log their readings on the clinic sheets provided, and bring them to review at follow up appointment in 4 months.  Nurse did assist patient set up Dexcom G7 with receiver and start sensor.  - Adjustment parameters are given to Jessica Mclean for hypo and hyperglycemia in writing. - she is encouraged to call clinic for blood glucose levels less than 70 or above 300 mg /dl. - she is advised to continue Metformin 1000 mg po twice daily and Ozempic 1 mg SQ weekly (wound not advance this due to body habitus with BMI less than 25- may try to de-escalate treatment plan moving forward), therapeutically suitable for patient.  - Specific targets for  A1c; LDL, HDL, and Triglycerides were discussed with the patient.  2) Blood Pressure /Hypertension:  Jessica Mclean blood pressure is controlled to target.   she is advised to continue Jessica Mclean current medications as prescribed by Jessica Mclean PCP.  3) Lipids/Hyperlipidemia:    Review of Jessica Mclean recent lipid panel from 09/17/22 showed uncontrolled LDL at 105 and elevated triglycerides of 159.  she is advised to continue Lipitor 80 mg daily at bedtime.  Side effects and precautions discussed with Jessica Mclean.  4)  Weight/Diet:  Jessica Mclean Body mass index is 21.06 kg/m.  - she is NOT a candidate for weight loss.  Exercise, and detailed carbohydrates information provided  -  detailed on discharge instructions.  5) Chronic Care/Health Maintenance: -she is on ACEI/ARB and Statin medications and is encouraged to initiate and continue to follow up with Ophthalmology, Dentist, Podiatrist at least yearly or according to recommendations, and advised to stay away from smoking. I have recommended yearly flu vaccine and pneumonia vaccine at  least every 5 years; moderate intensity exercise for up to 150 minutes weekly; and sleep for at least 7 hours a day.  - she is advised to maintain close follow up with Anabel Halon, MD for primary care needs, as well as Jessica Mclean other providers for optimal and coordinated care.   - Time spent in this patient care: 60 min, of which > 50% was spent in counseling Jessica Mclean about Jessica Mclean diabetes and the rest reviewing Jessica Mclean blood glucose logs, discussing Jessica Mclean hypoglycemia and hyperglycemia episodes, reviewing Jessica Mclean current and previous labs/studies (including abstraction from other facilities) and medications doses and developing a long term treatment plan based on the latest standards of care/guidelines; and documenting Jessica Mclean care.    Please refer to Patient Instructions for Blood Glucose Monitoring and Insulin/Medications Dosing Guide" in media tab for additional information. Please also refer  to "Patient Self Inventory" in the Media tab for reviewed elements of pertinent patient history.  Jessica Mclean participated in the discussions, expressed understanding, and voiced agreement with the above plans.  All questions were answered to Jessica Mclean satisfaction. she is encouraged to contact clinic should she have any questions or concerns prior to Jessica Mclean return visit.     Follow up plan: - Return in about 4 months (around 08/21/2023) for Diabetes F/U with A1c in office, No previsit labs.    Ronny Bacon, [facility] [address] [place], [place] [address] Phone: [phone] Fax: [phone]  04/22/2023, 10:22 AM

## 2023-04-22 NOTE — Patient Instructions (Signed)

## 2023-04-22 NOTE — Progress Notes (Signed)
Pt had all of her Dexcom G7 supplies with her today. She needed help with the sensor application process. Attempted to help pt with the Dexcom G7 app on her smartphone so we could lookup her BG readings from the office if she were to have issues with highs or lows. Pt already had the G7 app on her phone but could not remember the password and seemed very anxious as we attempted to go through the process of fixing this. Pt chose to use the Dexcom receiver to get her readings. Went over sensor application instructions and Dexcom receiver usage instructions. Pt did seem anxious with the whole process. She may call for a nurse visit when its time to change her sensor. She did not seem comfortable connecting the sensor to the receiver.

## 2023-04-23 ENCOUNTER — Telehealth: Payer: Self-pay

## 2023-04-23 ENCOUNTER — Other Ambulatory Visit: Payer: Self-pay

## 2023-04-23 ENCOUNTER — Ambulatory Visit: Payer: 59 | Admitting: Neurology

## 2023-04-23 DIAGNOSIS — R062 Wheezing: Secondary | ICD-10-CM

## 2023-04-23 DIAGNOSIS — I10 Essential (primary) hypertension: Secondary | ICD-10-CM

## 2023-04-23 DIAGNOSIS — N3020 Other chronic cystitis without hematuria: Secondary | ICD-10-CM

## 2023-04-23 DIAGNOSIS — E162 Hypoglycemia, unspecified: Secondary | ICD-10-CM

## 2023-04-23 DIAGNOSIS — E1149 Type 2 diabetes mellitus with other diabetic neurological complication: Secondary | ICD-10-CM

## 2023-04-23 DIAGNOSIS — G7000 Myasthenia gravis without (acute) exacerbation: Secondary | ICD-10-CM

## 2023-04-23 DIAGNOSIS — E1169 Type 2 diabetes mellitus with other specified complication: Secondary | ICD-10-CM

## 2023-04-23 DIAGNOSIS — R42 Dizziness and giddiness: Secondary | ICD-10-CM

## 2023-04-23 MED ORDER — MECLIZINE HCL 25 MG PO TABS: 25.0000 mg | ORAL_TABLET | Freq: Three times a day (TID) | ORAL | 1 refills | Status: DC | PRN

## 2023-04-23 MED ORDER — METFORMIN HCL 1000 MG PO TABS: 1000.0000 mg | ORAL_TABLET | Freq: Two times a day (BID) | ORAL | 2 refills | Status: DC

## 2023-04-23 MED ORDER — PHENAZOPYRIDINE HCL 100 MG PO TABS: 100.0000 mg | ORAL_TABLET | Freq: Three times a day (TID) | ORAL | 5 refills | Status: DC | PRN

## 2023-04-23 MED ORDER — TELMISARTAN 40 MG PO TABS: 40.0000 mg | ORAL_TABLET | Freq: Every day | ORAL | 2 refills | Status: DC

## 2023-04-23 MED ORDER — CYANOCOBALAMIN 1000 MCG PO TABS: 1000.0000 ug | ORAL_TABLET | Freq: Every day | ORAL | 0 refills | Status: AC

## 2023-04-23 MED ORDER — AZATHIOPRINE 50 MG PO TABS: 100.0000 mg | ORAL_TABLET | Freq: Every day | ORAL | 11 refills | Status: DC

## 2023-04-23 MED ORDER — DEXCOM G7 SENSOR MISC: 2 refills | Status: DC

## 2023-04-23 MED ORDER — DEXCOM G7 RECEIVER DEVI: 0 refills | Status: DC

## 2023-04-23 MED ORDER — ATORVASTATIN CALCIUM 80 MG PO TABS: 80.0000 mg | ORAL_TABLET | Freq: Every day | ORAL | 2 refills | Status: DC

## 2023-04-23 MED ORDER — OMEGA-3-ACID ETHYL ESTERS 1 G PO CAPS: 2.0000 g | ORAL_CAPSULE | Freq: Two times a day (BID) | ORAL | 0 refills | Status: DC

## 2023-04-23 MED ORDER — GABAPENTIN 100 MG PO CAPS: 100.0000 mg | ORAL_CAPSULE | Freq: Every day | ORAL | 2 refills | Status: DC

## 2023-04-23 MED ORDER — AMLODIPINE BESYLATE 10 MG PO TABS: 10.0000 mg | ORAL_TABLET | Freq: Every day | ORAL | 2 refills | Status: DC

## 2023-04-23 MED ORDER — PYRIDOSTIGMINE BROMIDE 60 MG PO TABS: 60.0000 mg | ORAL_TABLET | Freq: Three times a day (TID) | ORAL | 0 refills | Status: DC

## 2023-04-23 MED ORDER — ACCU-CHEK SOFTCLIX LANCETS MISC: 2 refills | Status: DC

## 2023-04-23 MED ORDER — ALBUTEROL SULFATE HFA 108 (90 BASE) MCG/ACT IN AERS: 2.0000 | INHALATION_SPRAY | Freq: Four times a day (QID) | RESPIRATORY_TRACT | 0 refills | Status: DC | PRN

## 2023-04-23 NOTE — Telephone Encounter (Signed)
Refills sent to pharmacy. 

## 2023-04-23 NOTE — Telephone Encounter (Signed)
Copied from CRM [id number]. Topic: Clinical - Medication Refill >> Apr 23, 2023 12:29 PM Tiffany H wrote: Most Recent Primary Care Visit:  Provider: Anabel Halon  Department: [facility]  Visit Type: OFFICE VISIT  Date: 01/15/2023  Medication: Accu-Chek Guide test strip USE TO CHECK BLOOD SUGAR 4 TIMES DAILY   Accu-Chek Guide w/Device Kit   Accu-Chek Softclix Lancets lancets USE TO CHECK BLOOD SUGAR 4 TIMES DAILY   albuterol 108 (90 Base) MCG/ACT inhaler Inhale 2 puffs into the lungs every 6 (six) hours as needed for wheezing or shortness of breath.   amLODipine 10 MG tablet TAKE 1 TABLET BY MOUTH DAILY   atorvastatin 80 MG tablet TAKE 1 TABLET BY MOUTH DAILY   azaTHIOprine 50 MG tablet Take 2 tablets (100 mg total) by mouth daily. Take 1 tablet (50 mg) for 1 week, then increase to 2 tablets (100 mg) thereafter.   blood glucose meter kit and supplies Dispense based on patient and insurance preference. Use up to four times daily as directed. (FOR ICD-10 E10.9, E11.9).   calcium-vitamin D 500-400 MG-UNIT tablet Take 1 tablet by mouth daily.   cyanocobalamin 1000 MCG tablet Take 1 tablet (1,000 mcg total) by mouth daily for 15 days. Start taking on: April 14, 2023   Dexcom G7 Receiver Hardie Pulley USE TO CHECK BLOOD GLUCOSE 3  TIMES DAILY BEFORE MEALS, AT  BEDTIME AND AS NEEDED   Dexcom G7 Sensor Misc USE TO CHECK BLOOD GLUCOSE 3  TIMES DAILY BEFORE MEALS, AT  BEDTIME AND AS NEEDED   Excedrin Extra Strength 250-250-65 MG tablet Take 2 tablets by mouth in the morning.   gabapentin 100 MG capsule TAKE 1 CAPSULE BY MOUTH AT  BEDTIME   meclizine 25 MG tablet Take 1 tablet (25 mg total) by mouth 3 (three) times daily as needed for dizziness.   metFORMIN 1000 MG tablet TAKE 1 TABLET BY MOUTH TWICE  DAILY WITH MEALS   omega-3 acid ethyl esters 1 g capsule Take 2 g by mouth 2 (two) times daily.   phenazopyridine 100 MG tablet Take 1 tablet (100 mg total) by mouth  3 (three) times daily as needed for pain (Dysuria).   predniSONE 10 MG tablet Take 1 tablet (10 mg total) by mouth daily with breakfast. Take 2 tablets (20 mg) daily   PRESERVISION AREDS PO Take 2 capsules by mouth daily.   pyridostigmine 60 MG tablet Take 1 tablet (60 mg total) by mouth 3 (three) times daily.   Semaglutide (1 MG/DOSE) 4 MG/3ML Sopn Inject 1 mg as directed once a week.   telmisartan 40 MG tablet TAKE 1 TABLET BY MOUTH DAILY   Has the patient contacted their pharmacy? Yes - Patient called to advise that [facility] hasn't received prescriptions from 04/18/23 discharge. Please send list from 04/18/23 ED Encounter to [facility] as listed below. Please call patient. She received a call earlier but missed picking up in time. Please call twice to allow patient to get to the phone.  (Agent: If no, request that the patient contact the pharmacy for the refill. If patient does not wish to contact the pharmacy document the reason why and proceed with request.) (Agent: If yes, when and what did the pharmacy advise?)  Is this the correct pharmacy for this prescription? Yes If no, delete pharmacy and type the correct one.  This is the patient's preferred pharmacy:   [facility] [place], [place] - [address]  [address] [place]  [address] Phone: [phone] Fax: [phone]  Has the prescription been filled recently? No  Is the patient out of the medication? Yes  Has the patient been seen for an appointment in the last year OR does the patient have an upcoming appointment? Yes  Can we respond through MyChart? No  Agent: Please be advised that Rx refills may take up to 3 business days. We ask that you follow-up with your pharmacy.

## 2023-04-27 ENCOUNTER — Telehealth: Payer: Self-pay | Admitting: Internal Medicine

## 2023-04-27 ENCOUNTER — Ambulatory Visit: Payer: Self-pay | Admitting: Internal Medicine

## 2023-04-27 ENCOUNTER — Telehealth: Payer: Self-pay

## 2023-04-27 NOTE — Telephone Encounter (Signed)
Pls see previous message that pt needs a face to face before we can order home health services

## 2023-04-27 NOTE — Telephone Encounter (Signed)
Pt called in office requesting home health services    Has been admitted to hospital recently  Myasthenia gravis with (acute) exacerbation (HCC)   04/08/2023 - 04/10/2023   [facility]     Declined hospital follow up   States that she has spoken with insurance / [facility] and this is service that she is eligible for.   Patient wants a call back in regard.

## 2023-04-27 NOTE — Telephone Encounter (Signed)
Copied from CRM [id number]. Topic: Clinical - Red Word Triage >> Apr 27, 2023  1:12 PM Fuller Mandril wrote: Red Word that prompted transfer to Nurse Triage: Dizzy, Light headed, shortness of breath - not feeling well  Agent and I both were on the phone trying to hear this patient.  Not a good signal--couldn't hear but a few words through static here and there.  This RN called the patient back in an attempt to speak with her.  After introducing myself then patient said "Well you're not....Marland Kitchen" and the phone disconnected again.  Upon calling patient back again, she was able to say that she did not want 911 and that she wants home health and I could not help her that she needed to talk to someone at her doctors office specifically.  The call disconnected again.  Unable to triage or talk to patient.  I then called the CAL at the PCP office. Spoke with the front desk and she advised me that she is familiar with this patient and she would call and assist the patient.  Answer Assessment - Initial Assessment Questions 1. REASON FOR CALL or QUESTION: "What is your reason for calling today?" or "How can I best help you?" or "What question do you have that I can help answer?"     Unable to triage.  Patient's phone kept disconnected.  However, patient did say that she did not need 911 and that she wanted home health and to talk directly to her PCP office.  Protocols used: Information Only Call - No Triage-A-AH

## 2023-04-27 NOTE — Telephone Encounter (Signed)
Copied from CRM [id number]. Topic: Clinical - Home Health Verbal Orders >> Apr 27, 2023  1:21 PM Fuller Mandril wrote: Caller/Agency: Leretha Pol Callback Number: [phone] Service Requested: Home Health  Frequency: N/A Any new concerns about the patient? N/A

## 2023-04-27 NOTE — Telephone Encounter (Signed)
Done

## 2023-04-27 NOTE — Telephone Encounter (Signed)
In order to qualify for home health services she has to have had a face to face visit within the last 90 days and she has not. Will need at least a video visit to get this process started

## 2023-04-28 ENCOUNTER — Telehealth: Payer: Self-pay

## 2023-04-28 ENCOUNTER — Encounter: Payer: Self-pay | Admitting: Family Medicine

## 2023-04-28 ENCOUNTER — Telehealth (INDEPENDENT_AMBULATORY_CARE_PROVIDER_SITE_OTHER): Payer: 59 | Admitting: Family Medicine

## 2023-04-28 DIAGNOSIS — G7000 Myasthenia gravis without (acute) exacerbation: Secondary | ICD-10-CM

## 2023-04-28 NOTE — Telephone Encounter (Signed)
Copied from CRM [id number]. Topic: Clinical - Medication Question >> Apr 28, 2023  9:32 AM Desma Mcgregor wrote: Reason for CRM: Luna Kitchens from Patient Relations called in to file a grievance for this patient. Pt doesn't like the process to speak with the clinic and having trouble with getting her medications. I explained to her the new call in process. Also advised that all the medications for this pt have been sent over to the [facility] and the pt has to allow them to be mailed. She advised that the albuterol (VENTOLIN HFA) 108 (90 Base) MCG/ACT inhaler is not covered and pt is upset asking why the Dr. would send that in. I advised to Luna Kitchens that I would check on that and the status of all the pt's meds. I contacted the pharmacy and Madelin stated that Proair needs to be sent instead, but the 5 medications that were fillable were shipped out 12/09 and today 12/10.  Per insurance: pyridostigmine (MESTINON) 60 MG tablet is too soon until 12/13 metFORMIN (GLUCOPHAGE) 1000 MG tablet too soon until 02/25 amLODipine (NORVASC) 10 MG tablet, too soon until 01/25 Continuous Glucose Sensor (DEXCOM G7 SENSOR) MISC too soon until 02/25 atorvastatin (LIPITOR) 80 MG tablet too soon until 01/25 cyanocobalamin 1000 MCG tablet is not covered, pharmacy advised to go locally - sent crm to clinical about this  Crm has been sent requesting the correct inhaler. Attempted to call pt about all this to make sure she understands, but no answer. Please contact patient back asap. [phone]

## 2023-04-28 NOTE — Telephone Encounter (Signed)
Copied from CRM [id number]. Topic: General - Billing Inquiry >> Apr 28, 2023 11:18 AM Orinda Kenner C wrote: Reason for CRM: Pt was told by Social services that pt needs to send copay and bills them, so pt needs the records to submit it. Pt really wants to speak with someone, because it's regarding her paying her bills. Pls c/b asap [phone]

## 2023-04-28 NOTE — Telephone Encounter (Signed)
Copied from CRM [id number]. Topic: Clinical - Prescription Issue >> Apr 28, 2023  9:28 AM Desma Mcgregor wrote: Reason for CRM: Pt medication, cyanocobalamin 1000 MCG tablet, was sent to [facility] and its not covered. Pharmacy advised that this Rx should be sent locally. However, the pt is unable to go locally at this time. Please look into this

## 2023-04-28 NOTE — Telephone Encounter (Signed)
Attempted to call patient, unable to reach patient.

## 2023-04-28 NOTE — Telephone Encounter (Signed)
Patient called upset her insurance will not cover the Ventolin inhaler, but will cover proair, asking can provider resend the [employer] inhaler to pharmacy   Pharmacy  [facility]) - [place], [place] - [address] [facility] [address] [place] [address], [place] [address] Phone: [phone]  Fax: [phone] DEA #: --

## 2023-04-28 NOTE — Progress Notes (Signed)
Virtual Visit via Video Note  I connected with Jessica Mclean on 04/28/23 at  2:00 PM EST by a video enabled telemedicine application and verified that I am speaking with the correct person using two identifiers.  Patient Location: Home Provider Location: Office/Clinic  I discussed the limitations, risks, security, and privacy concerns of performing an evaluation and management service by video and the availability of in person appointments. I also discussed with the patient that there may be a patient responsible charge related to this service. The patient expressed understanding and agreed to proceed.  Subjective: PCP: Anabel Halon, MD  Chief Complaint  Patient presents with   Follow-up    Pt states she was in the hospital , and needs home health care , would like to have this ordered as soon as possible.    HPI The patient is requesting forms completed for home health aide due to her chronic condition of myasthenia gravis.  ROS: Per HPI  Current Outpatient Medications:    ACCU-CHEK GUIDE test strip, USE TO CHECK BLOOD SUGAR 4 TIMES DAILY, Disp: 400 strip, Rfl: 2   Accu-Chek Softclix Lancets lancets, USE TO CHECK BLOOD SUGAR 4 TIMES DAILY, Disp: 400 each, Rfl: 2   albuterol (VENTOLIN HFA) 108 (90 Base) MCG/ACT inhaler, Inhale 2 puffs into the lungs every 6 (six) hours as needed for wheezing or shortness of breath., Disp: 8 g, Rfl: 0   amLODipine (NORVASC) 10 MG tablet, Take 1 tablet (10 mg total) by mouth daily., Disp: 100 tablet, Rfl: 2   aspirin-acetaminophen-caffeine (EXCEDRIN EXTRA STRENGTH) 250-250-65 MG tablet, Take 2 tablets by mouth in the morning., Disp: , Rfl:    atorvastatin (LIPITOR) 80 MG tablet, Take 1 tablet (80 mg total) by mouth daily., Disp: 100 tablet, Rfl: 2   azaTHIOprine (IMURAN) 50 MG tablet, Take 2 tablets (100 mg total) by mouth daily. Take 1 tablet (50 mg) for 1 week, then increase to 2 tablets (100 mg) thereafter., Disp: 60 tablet, Rfl: 11   blood  glucose meter kit and supplies, Dispense based on patient and insurance preference. Use up to four times daily as directed. (FOR ICD-10 E10.9, E11.9)., Disp: 1 each, Rfl: 0   Blood Glucose Monitoring Suppl (ACCU-CHEK GUIDE) w/Device KIT, , Disp: , Rfl:    calcium-vitamin D (OSCAL-500) 500-400 MG-UNIT tablet, Take 1 tablet by mouth daily., Disp: , Rfl:    Continuous Glucose Receiver (DEXCOM G7 RECEIVER) DEVI, USE TO CHECK BLOOD GLUCOSE 3  TIMES DAILY BEFORE MEALS, AT  BEDTIME AND AS NEEDED, Disp: 1 each, Rfl: 0   Continuous Glucose Sensor (DEXCOM G7 SENSOR) MISC, USE TO CHECK BLOOD GLUCOSE 3  TIMES DAILY BEFORE MEALS, AT  BEDTIME AND AS NEEDED, Disp: 10 each, Rfl: 2   cyanocobalamin 1000 MCG tablet, Take 1 tablet (1,000 mcg total) by mouth daily for 15 days., Disp: 15 tablet, Rfl: 0   gabapentin (NEURONTIN) 100 MG capsule, Take 1 capsule (100 mg total) by mouth at bedtime., Disp: 100 capsule, Rfl: 2   meclizine (ANTIVERT) 25 MG tablet, Take 1 tablet (25 mg total) by mouth 3 (three) times daily as needed for dizziness., Disp: 30 tablet, Rfl: 1   metFORMIN (GLUCOPHAGE) 1000 MG tablet, Take 1 tablet (1,000 mg total) by mouth 2 (two) times daily with a meal., Disp: 200 tablet, Rfl: 2   Multiple Vitamins-Minerals (PRESERVISION AREDS PO), Take 2 capsules by mouth daily., Disp: , Rfl:    omega-3 acid ethyl esters (LOVAZA) 1 g capsule, Take 2  capsules (2 g total) by mouth 2 (two) times daily., Disp: 180 capsule, Rfl: 0   phenazopyridine (PYRIDIUM) 100 MG tablet, Take 1 tablet (100 mg total) by mouth 3 (three) times daily as needed for pain (Dysuria)., Disp: 90 tablet, Rfl: 5   predniSONE (DELTASONE) 10 MG tablet, Take 1 tablet (10 mg total) by mouth daily with breakfast. Take 2 tablets (20 mg) daily, Disp: 180 tablet, Rfl: 0   pyridostigmine (MESTINON) 60 MG tablet, Take 1 tablet (60 mg total) by mouth 3 (three) times daily., Disp: 90 tablet, Rfl: 0   Semaglutide, 1 MG/DOSE, 4 MG/3ML SOPN, Inject 1 mg as  directed once a week., Disp: 9 mL, Rfl: 1   telmisartan (MICARDIS) 40 MG tablet, Take 1 tablet (40 mg total) by mouth daily., Disp: 100 tablet, Rfl: 2  Observations/Objective: There were no vitals filed for this visit. Physical Exam Speech is clear and coherent with logical content.  Patient is alert and oriented at baseline.   Assessment and Plan: Myasthenia gravis without acute exacerbation (HCC)   PCS forms completed completed The patient declines a referral to home health for physical therapy and Occupational Therapy today. Encouraged to follow-up with PCP as scheduled Follow Up Instructions: No follow-ups on file.   I discussed the assessment and treatment plan with the patient. The patient was provided an opportunity to ask questions, and all were answered. The patient agreed with the plan and demonstrated an understanding of the instructions.   The patient was advised to call back or seek an in-person evaluation if the symptoms worsen or if the condition fails to improve as anticipated.  The above assessment and management plan was discussed with the patient. The patient verbalized understanding of and has agreed to the management plan.   Gilmore Laroche, FNP

## 2023-04-30 ENCOUNTER — Telehealth: Payer: Self-pay

## 2023-04-30 DIAGNOSIS — G7000 Myasthenia gravis without (acute) exacerbation: Secondary | ICD-10-CM | POA: Diagnosis not present

## 2023-04-30 NOTE — Telephone Encounter (Signed)
Multiple attempts to reach patient 

## 2023-04-30 NOTE — Telephone Encounter (Signed)
Copied from CRM [id number]. Topic: Clinical - Medication Question >> Apr 23, 2023 11:14 AM Joanette Gula wrote: Reason for CRM: Pt. Has a question about her medications

## 2023-05-01 ENCOUNTER — Ambulatory Visit: Payer: 59 | Admitting: Neurology

## 2023-05-03 ENCOUNTER — Other Ambulatory Visit: Payer: Self-pay | Admitting: Internal Medicine

## 2023-05-03 DIAGNOSIS — R062 Wheezing: Secondary | ICD-10-CM

## 2023-05-03 MED ORDER — ALBUTEROL SULFATE HFA 108 (90 BASE) MCG/ACT IN AERS
2.0000 | INHALATION_SPRAY | Freq: Four times a day (QID) | RESPIRATORY_TRACT | 1 refills | Status: DC | PRN
Start: 2023-05-03 — End: 2023-05-03

## 2023-05-03 MED ORDER — ALBUTEROL SULFATE HFA 108 (90 BASE) MCG/ACT IN AERS: 2.0000 | INHALATION_SPRAY | Freq: Four times a day (QID) | RESPIRATORY_TRACT | 1 refills | Status: AC | PRN

## 2023-05-03 NOTE — Addendum Note (Signed)
Addended byTrena Platt on: 05/03/2023 08:31 PM   Modules accepted: Orders

## 2023-05-04 NOTE — Telephone Encounter (Signed)
lvm

## 2023-05-05 NOTE — Progress Notes (Signed)
I saw Jessica [facility] in neurology clinic on 05/12/23 in follow up for AChR ab positive, generalized myasthenia gravis.  HPI: Jessica [facility] is a 77 y.o. year old female with a history of HTN, IDDM, HLD, cervical cancer s/p radiation, TIA, depression, vit D deficiency, sleep apnea who we last saw on 12/26/22 (video visit).  To briefly review: Initial consultation (11/06/22): Patient woke up over a year ago and had difficulty lifting her head and had vertigo. She called her daughter who is a doctor, who insisted patient go to the [facility]. She also had dysarthria. This was first thing in the morning. She had the COVID vaccine the day before, so symptoms were attributed to that. The vertigo resolved. She continues to have falls. She is not sure why. She had an MRI finally in 09/2022 (about 1 month ago) that did not show evidence of a stroke. Over the last year, she has noticed symptoms are worse with fatigue, end of day, and with use. PCP checked AChR blocking ab and was elevated to 52.    Patient has stopped taking all of her medications due to concerns of what would make myasthenia worse.   Current MG symptoms: Ptosis: Yes, especially while watching TV Double vision: Yes Speech: Yes, worse when talking more and at end of day Chewing: Yes Swallowing: Yes, choking on solids. Liquids tend to dribble out of mouth. Breathing: Denies orthopnea Arm strength: No issues Leg strength: Does not think she is weak, but does endorse falls.   She is not on any medication for myasthenia gravis.   Of note patient takes gabapentin 100 mg qhs for diabetic neuropathy. This helps.   Patient is currently going to speech therapy. She is not sure she will continue due to costly co-pay.   She does not report any constitutional symptoms like fever, night sweats, anorexia or unintentional weight loss.   EtOH use: None  Restrictive diet? Vegan, not on B12. She has been eating much less lately, mostly bread and  tomatoes Family history of neuropathy/myopathy/neurologic disease? No   11/19/22: Patient has not gotten her blood work or CT chest. Patient has not been called to get CT chest per her report, but it appears [facility] has been trying to reach patient (per notes).   She got IVIg at home from 11/14/22-11/18/22. She had significant fatigue during the treatment.   Current MG symptoms: Ptosis: About the same as prior Double vision: About the same as prior Speech: Has been clearer Chewing: Better than prior Swallowing: A little improvement Breathing: Did get woken up with shortness of breath Arm strength: Feels weak, which she did not mention at last visit. Fatigues easily. Leg strength: Fatigues easily. Fell this morning.   Current medications:  -Prednisone 20 mg daily -Mestinon 60 mg TID S/p IVIg 0.4 g/kg/day for 5 days (11/14/22-11/18/22)   Side effects: None   12/26/22: AChR abs were again positive, but lab work was otherwise unremarkable, including TPMT activity. CT chest showed no evidence of thymoma. Patient thinks the IVIg is really helping. She take take pills and can swallow much better.   She received her monthly maintenance of IVIg on 12/14/22. She is tolerating this well. Her next dose 01/19/23.   Current MG symptoms: Ptosis: Still having, not sure if this has improved Double vision: Very occasional, much better Speech: Thinks this has improved, still has some slurred Chewing: Much improved Swallowing: Much improved Breathing: Denies orthopnea Arm strength: Has to take frequent breaks to keep strength  or will get weak Leg strength: Has to take frequent breaks as above   Current medications:  -Prednisone 20 mg daily (has only been taking once daily) -Mestinon 60 mg TID   Side effects: Occasional diarrhea  Most recent Assessment and Plan (12/26/22): This is Jessica [facility], a 77 y.o. female with  AChR ab positive, generalized myasthenia gravis. CT chest showed no evidence  of thymoma. She is s/p IVIg from 11/14/22-11/17/21 and 12/14/22. Next dose is 01/19/23. She is improving but with residual dysarthria and fatigable weakness currently. After discussion, we will start Imuran today and use IVIg as a bridge to Imuran.    Plan: -Continue monthly IVIg (next dose 01/19/23) -Reduce Prednisone: 10 mg daily (has already been taking it this way) -Mestinon 60 mg TID -Start Imuran 50 mg daily for 1 week, then 100 mg daily thereafter (will target 2-3 mg/kg dosing) -Will need monthly CBC w/ diff and CMP for 3 months. Will discuss with [facility] who gives patient IVIg to see if they can draw as patient does not have reliable transportation.  Since their last visit: Patient still feels very weak. She will be walking and banging into walls. She has had 1 fall since last visit. She states she is trying to eat more. She has occasional left sided chest pain.   She feels shaky in her hands. She does not write well.    Patient had IVIg on 01/19/23, 02/18/23, 03/27/23, and 04/28/23 (not scheduled for more at this point). She has been tolerating this well and happy with her home infusion.   Patient was hospitalized from 04/08/23-04/10/23 for two weeks of unsteady gait and generalized weakness. She had an unwitnessed syncopal episode on 04/08/23. CT head showed no acute process. B12 was found to be low, which was thought to be due to poor eating habits. MG exacerbation was felt to be less likely as patient had just gotten IVIg recently and did not have fatigable weakness. Patient was given IM B12 1000 mcg in the [facility] and 1000 mcg orally daily at discharge.  Since discharge, she wants to stay in the bed all the time. She does not feel safe walking.   Current MG symptoms: Ptosis: nothing significant Double vision: none Speech: Improved but still some slurring Chewing: has to cut food small, but no difficulty chewing Swallowing: None Breathing: None Arm strength: No significant weakness Leg  strength: Feels weak often   Current medications:  -Prednisone 10 mg daily -Mestinon 60 mg TID -Imuran 100 mg daily   Side effects: mild diarrhea    MEDICATIONS:  Outpatient Encounter Medications as of 05/12/2023  Medication Sig   ACCU-CHEK GUIDE test strip USE TO CHECK BLOOD SUGAR 4 TIMES DAILY   Accu-Chek Softclix Lancets lancets USE TO CHECK BLOOD SUGAR 4 TIMES DAILY   albuterol (VENTOLIN HFA) 108 (90 Base) MCG/ACT inhaler Inhale 2 puffs into the lungs every 6 (six) hours as needed for wheezing or shortness of breath.   amLODipine (NORVASC) 10 MG tablet Take 1 tablet (10 mg total) by mouth daily.   aspirin-acetaminophen-caffeine (EXCEDRIN EXTRA STRENGTH) 250-250-65 MG tablet Take 2 tablets by mouth in the morning.   atorvastatin (LIPITOR) 80 MG tablet Take 1 tablet (80 mg total) by mouth daily.   azaTHIOprine (IMURAN) 50 MG tablet Take 2 tablets (100 mg total) by mouth daily. Take 1 tablet (50 mg) for 1 week, then increase to 2 tablets (100 mg) thereafter.   blood glucose meter kit and supplies Dispense based on patient and  insurance preference. Use up to four times daily as directed. (FOR ICD-10 E10.9, E11.9).   Blood Glucose Monitoring Suppl (ACCU-CHEK GUIDE) w/Device KIT    calcium-vitamin D (OSCAL-500) 500-400 MG-UNIT tablet Take 1 tablet by mouth daily.   Continuous Glucose Receiver (DEXCOM G7 RECEIVER) DEVI USE TO CHECK BLOOD GLUCOSE 3  TIMES DAILY BEFORE MEALS, AT  BEDTIME AND AS NEEDED   Continuous Glucose Sensor (DEXCOM G7 SENSOR) MISC USE TO CHECK BLOOD GLUCOSE 3  TIMES DAILY BEFORE MEALS, AT  BEDTIME AND AS NEEDED   [EXPIRED] cyanocobalamin 1000 MCG tablet Take 1 tablet (1,000 mcg total) by mouth daily for 15 days.   gabapentin (NEURONTIN) 100 MG capsule Take 1 capsule (100 mg total) by mouth at bedtime.   meclizine (ANTIVERT) 25 MG tablet Take 1 tablet (25 mg total) by mouth 3 (three) times daily as needed for dizziness.   metFORMIN (GLUCOPHAGE) 1000 MG tablet Take 1 tablet  (1,000 mg total) by mouth 2 (two) times daily with a meal.   Multiple Vitamins-Minerals (PRESERVISION AREDS PO) Take 2 capsules by mouth daily.   omega-3 acid ethyl esters (LOVAZA) 1 g capsule Take 2 capsules (2 g total) by mouth 2 (two) times daily.   phenazopyridine (PYRIDIUM) 100 MG tablet Take 1 tablet (100 mg total) by mouth 3 (three) times daily as needed for pain (Dysuria). (Patient not taking: Reported on 05/12/2023)   predniSONE (DELTASONE) 10 MG tablet Take 1 tablet (10 mg total) by mouth daily with breakfast. Take 2 tablets (20 mg) daily   pyridostigmine (MESTINON) 60 MG tablet Take 1 tablet (60 mg total) by mouth 3 (three) times daily.   Semaglutide, 1 MG/DOSE, 4 MG/3ML SOPN Inject 1 mg as directed once a week.   telmisartan (MICARDIS) 40 MG tablet Take 1 tablet (40 mg total) by mouth daily.   No facility-administered encounter medications on file as of 05/12/2023.    PAST MEDICAL HISTORY: Past Medical History:  Diagnosis Date   Bleeding disorder (HCC)    Cervical cancer (HCC) 2010   Diabetes mellitus without complication (HCC)    type 2   Diabetes mellitus without complication (HCC)    Heart murmur    High cholesterol    HLD (hyperlipidemia)    Hypertension    Mental disorder    depression; suicidal ideation in 2017   Sleep apnea    Vitamin D deficiency     PAST SURGICAL HISTORY: Past Surgical History:  Procedure Laterality Date   BILATERAL OOPHORECTOMY     CESAREAN SECTION     x 4   CHOLECYSTECTOMY     COLOSTOMY     x2   CYSTOSCOPY WITH URETHRAL DILATATION N/A 11/15/2020   Procedure: CYSTOSCOPY WITH URETHRAL DILATION;  Surgeon: Malen Gauze, MD;  Location: [facility];  Service: Urology;  Laterality: N/A;   SALPINGECTOMY     TONSILLECTOMY     TONSILLECTOMY AND ADENOIDECTOMY      ALLERGIES: Allergies  Allergen Reactions   Other     Nickel staples   Poison Ivy Treatments     Poison Ivy     FAMILY HISTORY: Family History  Problem Relation Age of  Onset   Cancer Father        kidney   Cancer Mother        lung   Other Brother        heart issues    SOCIAL HISTORY: Social History   Tobacco Use   Smoking status: Never   Smokeless tobacco:  Never  Vaping Use   Vaping status: Never Used  Substance Use Topics   Alcohol use: Never   Drug use: Never   Social History   Social History Narrative   ** Merged History Encounter **    Right handed   Leave alone-1 level home   Caffeine--soda/coffee       Are you currently employed ?    What is your current occupation? retired   Do you live at home alone?yes   Who lives with you?            Objective:  Vital Signs:  BP (!) 151/88   Pulse 68   Ht 5' 4.5" (1.638 m)   Wt 121 lb (54.9 kg)   LMP  (LMP Unknown)   SpO2 99%   BMI 20.45 kg/m   General: General appearance: Awake and alert. No distress. Cooperative with exam.  Skin: No obvious rash or jaundice. HEENT: Atraumatic. Anicteric. Lungs: Non-labored breathing on room air  Extremities: No edema. No obvious deformity.  Musculoskeletal: No obvious joint swelling.  Neurological: Mental Status: Alert. Speech fluent. No pseudobulbar affect Cranial Nerves: CNII: No RAPD. Visual fields intact. CNIII, IV, VI: PERRL. No nystagmus. EOMI. No diplopia with sustained upgaze. CN V: Facial sensation intact bilaterally to fine touch. CN VII: Right orbicularis oculi mildly weak, cannot hold air in mouth. No ptosis at rest or after sustained upgaze. CN VIII: Hears finger rub well bilaterally. CN IX: No hypophonia. CN X: Palate elevates symmetrically. CN XI: Full strength shoulder shrug bilaterally. CN XII: Tongue protrusion full and midline. No atrophy or fasciculations. Mild dysarthria. Motor: Tone is normal.  Individual muscle group testing (MRC grade out of 5):  Movement     Neck flexion 5    Neck extension 5     Right Left   Shoulder abduction 5 5 Fatigable weakness  Elbow flexion 5 5   Elbow extension 5 5    Finger abduction - FDI 5 5   Finger abduction - ADM 5 5   Finger extension 5 5   Finger distal flexion - 2/3 5 5    Finger distal flexion - 4/5 5 5    Thumb flexion - FPL 5 5   Thumb abduction - APB 4+ 4+    Hip flexion 4+ 4+   Hip extension 5 5   Hip adduction 5 5   Hip abduction 5 5   Knee extension 5 5   Knee flexion 5 5   Dorsiflexion 5 5   Plantarflexion 5 5    Reflexes:  Right Left  Bicep 2+ 2+  Tricep 2+ 2+  BrRad 2+ 2+  Knee 2+ 2+  Ankle 2+ 2+   Sensation: Pinprick: Intact in all extremities Coordination: Intact finger-to- nose-finger and heel-to-shin bilaterally. Gait: Able to rise from chair with arms crossed unassisted. Normal, narrow-based gait. Swaying when walking but able to self correct.   Lab and Test Review: New results: 04/17/23: B6: 2.9 (low) Folate wnl B1 wnl  04/09/23: B12: 153 CBC significant for Hb 9.5 (chronic), MCV 90.0  04/08/23: Vit D: 27.45 HbA1c: 6.7 TSH wnl CMP unremarkable  01/15/23: HbA1c: 7.3 CMP significant for glucose 179, Cr 1.26   01/19/23: CMP significant for Cr 1.01, glucose 183 CBC significant for Hb 8.4 (previously 9.4), WBC and platelets wnl  Previously reviewed results: 11/23/22: B1 wnl TSH wnl TPMT: normal activity B12: 357 AChR binding ab: 2.05, modulating 81   09/17/22: AChR blocking abs: elevated to 52 HbA1c: 5.7 (  high of 9.2 on 01/09/21) CMP unremarkable CBC w/ diff significant for Hb 9.4 (chronic) Lipid panel: Component     Latest Ref Rng 09/17/2022  Cholesterol, Total     100 - 199 mg/dL 563   Triglycerides     0 - 149 mg/dL 875 (H)   HDL Cholesterol     >39 mg/dL 52   VLDL Cholesterol Cal     5 - 40 mg/dL 28   LDL Chol Calc (NIH)     0 - 99 mg/dL 643 (H)   Total CHOL/HDL Ratio     0.0 - 4.4 ratio 3.6     TSH (04/15/22): 1.220 Vit D (04/15/22): 32.2   Imaging: MRI brain wo contrast (10/14/22): FINDINGS: Brain: Diffusion imaging does not show any acute or subacute infarction. No  abnormality affects the brainstem or cerebellum. Incidental perivascular spaces in the right mid brain. Cerebral hemispheres show mild chronic small-vessel ischemic change of the deep and subcortical white matter. No cortical or large vessel territory stroke. No mass lesion, hemorrhage, hydrocephalus or extra-axial collection.   Vascular: Major vessels at the base of the brain show flow.   Skull and upper cervical spine: Negative   Sinuses/Orbits: Clear/normal   Other: None   IMPRESSION: No acute finding. Mild chronic small-vessel ischemic change of the cerebral hemispheric white matter.   CT chest w contrast (12/03/22): FINDINGS: Cardiovascular: The cardiac size is normal. There is no pericardial effusion. There is three-vessel coronary artery calcification greatest in the proximal LAD.   There are mild scattered calcific plaques in the aorta. The great vessels are clear. There is no aortic aneurysm, dissection or stenosis. The pulmonary arteries and veins are normal caliber. The pulmonary arteries are centrally clear.   Mediastinum/Nodes: No enlarged mediastinal, hilar, or axillary lymph nodes. Thyroid gland, trachea, and esophagus demonstrate no significant findings.   Lungs/Pleura: Small right posterior diaphragmatic fat herniation. There is a calcified granuloma in the right lower lobe. The lungs hyperexpanded but clear of infiltrates and nodules. There is no pleural effusion, thickening or pneumothorax.   Upper Abdomen: The liver is mildly steatotic. Gallbladder is absent with prominent common bile duct measuring 12 mm most likely due to prior cholecystectomy, with mild central intrahepatic biliary prominence.   Laboratory and clinical correlation suggested. No acute upper abdominal findings. No chest wall mass is seen.   Musculoskeletal: Osteopenia with thoracic kyphosis and multilevel degenerative disc disease with spondylosis. No acute or other significant  osseous findings. The ribcage is intact.   IMPRESSION: 1. No acute chest CT or significant mediastinal findings. 2. Aortic and coronary artery atherosclerosis. 3. Hyperinflated.  No focal infiltrates. 4. Mild hepatic steatosis. 5. Prior cholecystectomy with prominent common bile duct and mild central intrahepatic biliary prominence. 6. Osteopenia and degenerative change.  ASSESSMENT: This is Jessica [facility], a 77 y.o. female with AChR ab positive, generalized myasthenia gravis. CT chest showed no evidence of thymoma. She is s/p IVIg from 11/14/22-11/17/21 and 12/14/22, 01/19/23, 02/18/23, 03/27/23, and 04/28/23. Imuran was started 12/26/22. She was hospitalized from 04/08/23-04/10/23 for two weeks of unsteady gait and generalized weakness. She was found to have several vitamin deficiencies (vit D, B6, and B12) thought to be the cause. She did not seem to have exacerbation of MG.  Despite the multiple therapies and monthly IVIg, patient still feels very weak and is not active due to concerns of falling. This is likely contributing to overlapping deconditioning. I would like to get her therapy and switch IVIg to Vyvgart Hytrulo  for better control of her MG symptoms.  Plan: -Will try to switch patient to Vyvgart Hytrulo from IVIg - still needs home infusion -Reduce Prednisone to 7.5 mg daily. Patient will have medication mailed, so will continue 10 mg daily until the 7.5 mg daily arrives. -Continue Mestinon 60 mg TID -Continue Imuran 100 mg daily -Lab work at next visit: CBC and CMP in 3 months; will also reach B1, B6, B12, and vit D at that time -Home PT as patient does not have reliable transportation -Continue B complex and patient will make sure it has at least 1000 mcg of B12 as well.  Return to clinic in 3 months  Total time spent reviewing records, interview, history/exam, documentation, and coordination of care on day of encounter:  60 min  Jacquelyne Balint, MD

## 2023-05-12 ENCOUNTER — Encounter: Payer: Self-pay | Admitting: Neurology

## 2023-05-12 ENCOUNTER — Ambulatory Visit: Payer: 59 | Admitting: Neurology

## 2023-05-12 VITALS — BP 150/79 | HR 68 | Ht 64.5 in | Wt 121.0 lb

## 2023-05-12 DIAGNOSIS — R269 Unspecified abnormalities of gait and mobility: Secondary | ICD-10-CM | POA: Diagnosis not present

## 2023-05-12 DIAGNOSIS — H02403 Unspecified ptosis of bilateral eyelids: Secondary | ICD-10-CM | POA: Diagnosis not present

## 2023-05-12 DIAGNOSIS — R471 Dysarthria and anarthria: Secondary | ICD-10-CM | POA: Diagnosis not present

## 2023-05-12 DIAGNOSIS — G7000 Myasthenia gravis without (acute) exacerbation: Secondary | ICD-10-CM

## 2023-05-12 DIAGNOSIS — M6281 Muscle weakness (generalized): Secondary | ICD-10-CM

## 2023-05-12 DIAGNOSIS — H532 Diplopia: Secondary | ICD-10-CM

## 2023-05-12 DIAGNOSIS — R1310 Dysphagia, unspecified: Secondary | ICD-10-CM | POA: Diagnosis not present

## 2023-05-12 MED ORDER — PREDNISONE 2.5 MG PO TABS: 7.5000 mg | ORAL_TABLET | Freq: Every day | ORAL | 5 refills | Status: DC

## 2023-05-12 NOTE — Patient Instructions (Addendum)
Reduce your prednisone to 7.5 mg daily (3 tablets of 2.5 mg each). You will take 10 mg daily until you get your new prescription.  Continue mestinon 60 mg three times daily.  Continue Imuran 100 mg daily.  Take B12 1000 mcg every day. This can be bought over the counter or online. Continue your B complex as well.  I will be looking into the home infusion (IVIg) and see if we can switch that to an injection called Vyvgart Hytrulo. I will let you know soon what direct we are going with this.  I am ordering physical therapy for you and will attempt to get this in your home to help with your weakness and get you stronger.  Go to nearest emergency room if you have severe weakness, difficulty breathing or swallowing as this could be a myasthenic crisis (flare).  I will see you back in clinic in about 3 months.   The physicians and staff at [facility] are committed to providing excellent care. You may receive a survey requesting feedback about your experience at our office. We strive to receive "very good" responses to the survey questions. If you feel that your experience would prevent you from giving the office a "very good " response, please contact our office to try to remedy the situation. We may be reached at [phone]. Thank you for taking the time out of your busy day to complete the survey.  Jacquelyne Balint, MD [facility]

## 2023-05-14 ENCOUNTER — Other Ambulatory Visit: Payer: Self-pay | Admitting: Neurology

## 2023-05-14 DIAGNOSIS — G7000 Myasthenia gravis without (acute) exacerbation: Secondary | ICD-10-CM

## 2023-05-14 MED ORDER — PREDNISONE 2.5 MG PO TABS: 7.5000 mg | ORAL_TABLET | Freq: Every day | ORAL | 5 refills | Status: DC

## 2023-05-15 ENCOUNTER — Telehealth: Payer: Self-pay

## 2023-05-15 NOTE — Telephone Encounter (Signed)
[place] [place] [facility] [address] [place] [address] Phone: [phone] . Fax: [phone] Home health  Called and they have PT but she is checking on the location in [place]. Will call back

## 2023-05-21 ENCOUNTER — Encounter: Payer: Self-pay | Admitting: Internal Medicine

## 2023-05-21 ENCOUNTER — Ambulatory Visit (INDEPENDENT_AMBULATORY_CARE_PROVIDER_SITE_OTHER): Payer: 59 | Admitting: Internal Medicine

## 2023-05-21 ENCOUNTER — Telehealth: Payer: Self-pay | Admitting: Neurology

## 2023-05-21 ENCOUNTER — Telehealth: Payer: Self-pay

## 2023-05-21 VITALS — BP 138/74 | HR 96 | Ht 64.5 in | Wt 122.6 lb

## 2023-05-21 DIAGNOSIS — E538 Deficiency of other specified B group vitamins: Secondary | ICD-10-CM

## 2023-05-21 DIAGNOSIS — I10 Essential (primary) hypertension: Secondary | ICD-10-CM | POA: Diagnosis not present

## 2023-05-21 DIAGNOSIS — R5381 Other malaise: Secondary | ICD-10-CM | POA: Diagnosis not present

## 2023-05-21 DIAGNOSIS — G7000 Myasthenia gravis without (acute) exacerbation: Secondary | ICD-10-CM | POA: Diagnosis not present

## 2023-05-21 DIAGNOSIS — E1169 Type 2 diabetes mellitus with other specified complication: Secondary | ICD-10-CM | POA: Diagnosis not present

## 2023-05-21 DIAGNOSIS — E785 Hyperlipidemia, unspecified: Secondary | ICD-10-CM | POA: Diagnosis not present

## 2023-05-21 DIAGNOSIS — Z7985 Long-term (current) use of injectable non-insulin antidiabetic drugs: Secondary | ICD-10-CM

## 2023-05-21 DIAGNOSIS — E782 Mixed hyperlipidemia: Secondary | ICD-10-CM

## 2023-05-21 MED ORDER — CYANOCOBALAMIN 1000 MCG/ML IJ SOLN
1000.0000 ug | Freq: Once | INTRAMUSCULAR | Status: AC
  Administered 2023-05-21: 1000 ug via INTRAMUSCULAR

## 2023-05-21 NOTE — Assessment & Plan Note (Addendum)
 Lab Results  Component Value Date   HGBA1C 6.7 (H) 04/08/2023   Well-controlled Continue Ozempic  1 mg qw for now, although would be cautious with weight loss - plan to decrease dose after checking HbA1c Since she is on oral prednisone  now, continue metformin  1000 mg twice daily Needs CGM to monitor for hypoglycemia - will send Freestyle libre Rx Advised to follow diabetic diet On ARB and statin Diabetic eye exam: Advised to follow up with Ophthalmology for diabetic eye exam

## 2023-05-21 NOTE — Telephone Encounter (Signed)
 Called pt and left message to call office.

## 2023-05-21 NOTE — Telephone Encounter (Signed)
 Called pt number x 2 and telephone just stops ringing .

## 2023-05-21 NOTE — Assessment & Plan Note (Signed)
 On Lipitor

## 2023-05-21 NOTE — Patient Instructions (Addendum)
 Please stop taking Gabapentin for now.  Please continue taking other medications as prescribed.  Please bring your medications in the next visit.

## 2023-05-21 NOTE — Progress Notes (Signed)
 Established Patient Office Visit  Subjective:  Patient ID: Jessica Mclean, female    DOB: 02-Apr-1946  Age: 78 y.o. MRN: [id number]  CC:  Chief Complaint  Patient presents with   Diabetes    Follow up    Hypertension    Follow up     HPI Jessica Mclean is a 78 y.o. female with past medical history of HTN, DM2 on insulin , TIA, HLD and cervical ca. s/p radiation who presents for f/u of her chronic medical conditions.   Type II DM: She has stopped taking Metformin , but apparently has continued taking Ozempic . Of note, she admits that she has only 1 meal in a day. She has the Dexcom G7, but has difficulty connecting it to her phone.  She had nursing visit to help her connect it to her phone and had taught it to her as well, but still has difficulty connecting with it and checking blood glucose with it.  She prefers to use [employer] as she was able to use it better in the past. She has not checked blood glucose with regular fingerstick recently.  Of note, she has been taking prednisone  7.5 mg daily for myasthenia gravis, but admits that she had stopped it for 2 days in this week as she thought it was causing her dizziness.  HTN: BP is well-controlled. Takes medications regularly. Patient denies headache, chest pain, or palpitations.  Myasthenia gravis: Followed by neurology.  She is getting IV Ig infusions and is on Mestinon , Azathioprine  and oral prednisone  currently. She had hospitalization in 11/24 for severe weakness, but was thought to be due to B12 deficiency rather than myasthenia gravis crisis. She still has weakness and has gait disturbance. She has noticed improvement in visual disturbance and speech difficulty with myasthenia gravis treatment. She has history of CVA in the past.  Denies any new focal neurologic deficit. She has chronic weakness of the b/l LE.  Chronic cystitis: She has been taking Pyridium  for dysuria. Denies any hematuria, flank or pelvic pain currently.  MDD:  She has been feeling depressed since her daughter moved to [place] .  She has restarted taking duloxetine  60 mg QD, which she found from her old prescription.  She has spells of anxiety, crying spells and insomnia, but denies SI or HI currently.   Past Medical History:  Diagnosis Date   Bleeding disorder (HCC)    Cervical cancer (HCC) 2010   Diabetes mellitus without complication (HCC)    type 2   Diabetes mellitus without complication (HCC)    Heart murmur    High cholesterol    HLD (hyperlipidemia)    Hypertension    Mental disorder    depression; suicidal ideation in 2017   Sleep apnea    Vitamin D  deficiency     Past Surgical History:  Procedure Laterality Date   BILATERAL OOPHORECTOMY     CESAREAN SECTION     x 4   CHOLECYSTECTOMY     COLOSTOMY     x2   CYSTOSCOPY WITH URETHRAL DILATATION N/A 11/15/2020   Procedure: CYSTOSCOPY WITH URETHRAL DILATION;  Surgeon: Sherrilee Belvie CROME, MD;  Location: [facility];  Service: Urology;  Laterality: N/A;   SALPINGECTOMY     TONSILLECTOMY     TONSILLECTOMY AND ADENOIDECTOMY      Family History  Problem Relation Age of Onset   Cancer Father        kidney   Cancer Mother        lung  Other Brother        heart issues    Social History   Socioeconomic History   Marital status: Divorced    Spouse name: Not on file   Number of children: 3   Years of education: Not on file   Highest education level: Not on file  Occupational History   Occupation: retired  Tobacco Use   Smoking status: Never   Smokeless tobacco: Never  Vaping Use   Vaping status: Never Used  Substance and Sexual Activity   Alcohol use: Never   Drug use: Never   Sexual activity: Not Currently    Birth control/protection: Post-menopausal  Other Topics Concern   Not on file  Social History Narrative   ** Merged History Encounter **    Right handed   Leave alone-1 level home   Caffeine--soda/coffee       Are you currently employed ?    What is your  current occupation? retired   Do you live at home alone?yes   Who lives with you?           Social Drivers of Health   Financial Resource Strain: Medium Risk (07/31/2020)   Overall Financial Resource Strain (CARDIA)    Difficulty of Paying Living Expenses: Somewhat hard  Food Insecurity: No Food Insecurity (04/09/2023)   Hunger Vital Sign    Worried About Running Out of Food in the Last Year: Never true    Ran Out of Food in the Last Year: Never true  Transportation Needs: No Transportation Needs (04/09/2023)   PRAPARE - Administrator, Civil Service (Medical): No    Lack of Transportation (Non-Medical): No  Physical Activity: Insufficiently Active (07/31/2020)   Exercise Vital Sign    Days of Exercise per Week: 4 days    Minutes of Exercise per Session: 30 min  Stress: No Stress Concern Present (07/31/2020)   [employer] of Occupational Health - Occupational Stress Questionnaire    Feeling of Stress : Only a little  Social Connections: Unknown (10/01/2021)   Received from [facility], [facility]   Social Network    Social Network: Not on file  Intimate Partner Violence: Not At Risk (04/09/2023)   Humiliation, Afraid, Rape, and Kick questionnaire    Fear of Current or Ex-Partner: No    Emotionally Abused: No    Physically Abused: No    Sexually Abused: No    Outpatient Medications Prior to Visit  Medication Sig Dispense Refill   DULoxetine  HCl 60 MG CSDR Take 1 capsule by mouth daily.     ACCU-CHEK GUIDE test strip USE TO CHECK BLOOD SUGAR 4 TIMES DAILY 400 strip 2   Accu-Chek Softclix Lancets lancets USE TO CHECK BLOOD SUGAR 4 TIMES DAILY 400 each 2   albuterol  (VENTOLIN  HFA) 108 (90 Base) MCG/ACT inhaler Inhale 2 puffs into the lungs every 6 (six) hours as needed for wheezing or shortness of breath. 18 g 1   amLODipine  (NORVASC ) 10 MG tablet Take 1 tablet (10 mg total) by mouth daily. 100 tablet 2   aspirin -acetaminophen -caffeine (EXCEDRIN EXTRA  STRENGTH) 250-250-65 MG tablet Take 2 tablets by mouth in the morning.     atorvastatin  (LIPITOR) 80 MG tablet Take 1 tablet (80 mg total) by mouth daily. 100 tablet 2   azaTHIOprine  (IMURAN ) 50 MG tablet Take 2 tablets (100 mg total) by mouth daily. Take 1 tablet (50 mg) for 1 week, then increase to 2 tablets (100 mg) thereafter. 60 tablet 11  blood glucose meter kit and supplies Dispense based on patient and insurance preference. Use up to four times daily as directed. (FOR ICD-10 E10.9, E11.9). 1 each 0   Blood Glucose Monitoring Suppl (ACCU-CHEK GUIDE) w/Device KIT      calcium -vitamin D  (OSCAL-500) 500-400 MG-UNIT tablet Take 1 tablet by mouth daily.     meclizine  (ANTIVERT ) 25 MG tablet Take 1 tablet (25 mg total) by mouth 3 (three) times daily as needed for dizziness. 30 tablet 1   metFORMIN  (GLUCOPHAGE ) 1000 MG tablet Take 1 tablet (1,000 mg total) by mouth 2 (two) times daily with a meal. 200 tablet 2   Multiple Vitamins-Minerals (PRESERVISION AREDS PO) Take 2 capsules by mouth daily.     omega-3 acid ethyl esters (LOVAZA ) 1 g capsule Take 2 capsules (2 g total) by mouth 2 (two) times daily. 180 capsule 0   phenazopyridine  (PYRIDIUM ) 100 MG tablet Take 1 tablet (100 mg total) by mouth 3 (three) times daily as needed for pain (Dysuria). (Patient not taking: Reported on 05/12/2023) 90 tablet 5   predniSONE  (DELTASONE ) 2.5 MG tablet Take 3 tablets (7.5 mg total) by mouth daily with breakfast. 90 tablet 5   pyridostigmine  (MESTINON ) 60 MG tablet Take 1 tablet (60 mg total) by mouth 3 (three) times daily. 90 tablet 0   Semaglutide , 1 MG/DOSE, 4 MG/3ML SOPN Inject 1 mg as directed once a week. 9 mL 1   telmisartan  (MICARDIS ) 40 MG tablet Take 1 tablet (40 mg total) by mouth daily. 100 tablet 2   Continuous Glucose Receiver (DEXCOM G7 RECEIVER) DEVI USE TO CHECK BLOOD GLUCOSE 3  TIMES DAILY BEFORE MEALS, AT  BEDTIME AND AS NEEDED 1 each 0   Continuous Glucose Sensor (DEXCOM G7 SENSOR) MISC USE TO  CHECK BLOOD GLUCOSE 3  TIMES DAILY BEFORE MEALS, AT  BEDTIME AND AS NEEDED 10 each 2   gabapentin  (NEURONTIN ) 100 MG capsule Take 1 capsule (100 mg total) by mouth at bedtime. 100 capsule 2   No facility-administered medications prior to visit.    Allergies  Allergen Reactions   Other     Nickel staples   Poison Ivy Treatments     Poison Ivy     ROS Review of Systems  Constitutional:  Positive for fatigue. Negative for chills and fever.  HENT:  Positive for voice change. Negative for congestion, sinus pressure, sinus pain and sore throat.   Eyes:  Negative for pain and discharge.  Respiratory:  Negative for cough and shortness of breath.   Cardiovascular:  Negative for chest pain and palpitations.  Gastrointestinal:  Negative for blood in stool, nausea and vomiting.  Endocrine: Negative for polydipsia and polyuria.  Genitourinary:  Positive for urgency. Negative for dysuria, flank pain, frequency and hematuria.  Musculoskeletal:  Negative for neck pain and neck stiffness.  Skin:  Negative for rash.  Neurological:  Positive for dizziness, speech difficulty and weakness.  Psychiatric/Behavioral:  Positive for dysphoric mood and sleep disturbance. Negative for agitation and behavioral problems. The patient is nervous/anxious.       Objective:    Physical Exam Vitals reviewed.  Constitutional:      General: She is not in acute distress.    Appearance: She is not diaphoretic.  HENT:     Head: Normocephalic and atraumatic.     Nose: Nose normal.     Mouth/Throat:     Mouth: Mucous membranes are moist.  Eyes:     General: No scleral icterus.    Extraocular  Movements: Extraocular movements intact.  Cardiovascular:     Rate and Rhythm: Normal rate and regular rhythm.     Pulses: Normal pulses.     Heart sounds: Normal heart sounds. No murmur heard. Pulmonary:     Breath sounds: No wheezing or rales.  Musculoskeletal:     Cervical back: Neck supple. No tenderness.      Right lower leg: No edema.     Left lower leg: No edema.  Skin:    General: Skin is warm.     Findings: No rash.  Neurological:     General: No focal deficit present.     Mental Status: She is alert and oriented to person, place, and time.     Sensory: Sensory deficit (B/l feet) present.     Motor: Weakness (B/l UE and LE - 4/5) present.     Gait: Gait abnormal.     Comments: Slurred speech  Psychiatric:        Mood and Affect: Mood normal.        Behavior: Behavior normal.     BP 138/74 (BP Location: Left Arm)   Pulse 96   Ht 5' 4.5 (1.638 m)   Wt 122 lb 9.6 oz (55.6 kg)   LMP  (LMP Unknown)   SpO2 98%   BMI 20.72 kg/m  Wt Readings from Last 3 Encounters:  05/21/23 122 lb 9.6 oz (55.6 kg)  05/12/23 121 lb (54.9 kg)  04/22/23 124 lb 9.6 oz (56.5 kg)    Lab Results  Component Value Date   TSH 0.484 04/08/2023   Lab Results  Component Value Date   WBC 4.7 04/09/2023   HGB 9.5 (L) 04/09/2023   HCT 30.7 (L) 04/09/2023   MCV 90.0 04/09/2023   PLT 296 04/09/2023   Lab Results  Component Value Date   NA 137 04/09/2023   K 3.5 04/09/2023   CO2 24 04/09/2023   GLUCOSE 136 (H) 04/09/2023   BUN 25 (H) 04/09/2023   CREATININE 0.82 04/09/2023   BILITOT 0.7 04/08/2023   ALKPHOS 52 04/08/2023   AST 20 04/08/2023   ALT 12 04/08/2023   PROT 8.4 (H) 04/08/2023   ALBUMIN 4.3 04/08/2023   CALCIUM  9.1 04/09/2023   ANIONGAP 7 04/09/2023   EGFR 44 (L) 01/15/2023   Lab Results  Component Value Date   CHOL 185 09/17/2022   Lab Results  Component Value Date   HDL 52 09/17/2022   Lab Results  Component Value Date   LDLCALC 105 (H) 09/17/2022   Lab Results  Component Value Date   TRIG 159 (H) 09/17/2022   Lab Results  Component Value Date   CHOLHDL 3.6 09/17/2022   Lab Results  Component Value Date   HGBA1C 6.7 (H) 04/08/2023      Assessment & Plan:   Problem List Items Addressed This Visit       Cardiovascular and Mediastinum   Essential  hypertension   BP Readings from Last 1 Encounters:  05/21/23 138/74   Well-controlled with telmisartan  40 mg daily and amlodipine  10 mg daily now Counseled for compliance with the medications Advised DASH diet and moderate exercise/walking as tolerated        Endocrine   Type 2 diabetes mellitus with hyperlipidemia (HCC) - Primary   Lab Results  Component Value Date   HGBA1C 6.7 (H) 04/08/2023   Well-controlled Continue Ozempic  1 mg qw for now, although would be cautious with weight loss - plan to decrease dose after checking  HbA1c Since she is on oral prednisone  now, continue metformin  1000 mg twice daily Needs CGM to monitor for hypoglycemia - will send Freestyle libre Rx Advised to follow diabetic diet On ARB and statin Diabetic eye exam: Advised to follow up with Ophthalmology for diabetic eye exam      Relevant Medications   Continuous Glucose Sensor (FREESTYLE LIBRE 3 SENSOR) MISC   Continuous Glucose Receiver (FREESTYLE LIBRE 3 READER) DEVI   Other Relevant Orders   Ambulatory referral to Home Health     Nervous and Auditory   Myasthenia gravis without acute exacerbation (HCC)   Followed by neurology S/p IVIG treatment Has started Mestinon , Azathioprine  and oral prednisone  - symptoms had improved initially Unclear if her current weakness is due to nutritional deficiencies versus myasthenia gravis      Relevant Medications   DULoxetine  HCl 60 MG CSDR   Other Relevant Orders   Ambulatory referral to Home Health     Other   Hyperlipidemia   On Lipitor      B12 deficiency   Vit B12: 153 Vit B12 injection today Continue vitamin B12 1000 mcg QD      Relevant Medications   vitamin B-12 (CYANOCOBALAMIN ) 100 MCG tablet   Physical deconditioning   Likely due to myasthenia gravis, nutritional deficiencies including B12 deficiency Needs to take myasthenia gravis medications as prescribed by neurology Vitamin B12 injection today, continue oral  supplement Referred to home health for PT      Relevant Orders   Ambulatory referral to Home Health      Meds ordered this encounter  Medications   cyanocobalamin  (VITAMIN B12) injection 1,000 mcg   vitamin B-12 (CYANOCOBALAMIN ) 100 MCG tablet    Sig: Take 1 tablet (100 mcg total) by mouth daily.    Dispense:  90 tablet    Refill:  3   Continuous Glucose Sensor (FREESTYLE LIBRE 3 SENSOR) MISC    Sig: Use it to check blood glucose as instructed.    Dispense:  2 each    Refill:  5   Continuous Glucose Receiver (FREESTYLE LIBRE 3 READER) DEVI    Sig: Use it to check blood glucose as instructed.    Dispense:  1 each    Refill:  0    Follow-up: Return in about 3 weeks (around 06/11/2023).    Suzzane MARLA Blanch, MD

## 2023-05-21 NOTE — Telephone Encounter (Signed)
 Left message with the after hour service on 05-21-23 At 12;41 pm  Caller states that she needs to speak with Dr Loleta Chance. She is stating her whole body is tingling and feels faint her legs feel like they do before she faints   She has appt with PCP at 2;30

## 2023-05-21 NOTE — Assessment & Plan Note (Addendum)
 Followed by neurology S/p IVIG treatment Has started Mestinon, Azathioprine and oral prednisone - symptoms had improved initially Unclear if her current weakness is due to nutritional deficiencies versus myasthenia gravis

## 2023-05-21 NOTE — Assessment & Plan Note (Addendum)
 BP Readings from Last 1 Encounters:  05/21/23 138/74   Well-controlled with telmisartan 40 mg daily and amlodipine 10 mg daily now Counseled for compliance with the medications Advised DASH diet and moderate exercise/walking as tolerated

## 2023-05-22 DIAGNOSIS — R5381 Other malaise: Secondary | ICD-10-CM | POA: Insufficient documentation

## 2023-05-22 MED ORDER — VITAMIN B-12 100 MCG PO TABS: 100.0000 ug | ORAL_TABLET | Freq: Every day | ORAL | 3 refills | Status: AC

## 2023-05-22 MED ORDER — FREESTYLE LIBRE 3 READER DEVI: 0 refills | Status: AC

## 2023-05-22 MED ORDER — FREESTYLE LIBRE 3 SENSOR MISC: 5 refills | Status: DC

## 2023-05-22 NOTE — Assessment & Plan Note (Signed)
 Vit B12: 153 Vit B12 injection today Continue vitamin B12 1000 mcg QD

## 2023-05-22 NOTE — Telephone Encounter (Signed)
 Called Pt and informed her of Dr. Jorge Ny answer. She understood and she reported that her PCP told her the same thing, not to stop meds. I told her to call of any more concerns or questions.

## 2023-05-22 NOTE — Assessment & Plan Note (Signed)
 Likely due to myasthenia gravis, nutritional deficiencies including B12 deficiency Needs to take myasthenia gravis medications as prescribed by neurology Vitamin B12 injection today, continue oral supplement Referred to home health for PT

## 2023-05-24 ENCOUNTER — Other Ambulatory Visit: Payer: Self-pay | Admitting: Internal Medicine

## 2023-05-24 DIAGNOSIS — G7000 Myasthenia gravis without (acute) exacerbation: Secondary | ICD-10-CM

## 2023-05-27 DIAGNOSIS — G7000 Myasthenia gravis without (acute) exacerbation: Secondary | ICD-10-CM | POA: Diagnosis not present

## 2023-05-29 DIAGNOSIS — Z7984 Long term (current) use of oral hypoglycemic drugs: Secondary | ICD-10-CM | POA: Diagnosis not present

## 2023-05-29 DIAGNOSIS — G4730 Sleep apnea, unspecified: Secondary | ICD-10-CM | POA: Diagnosis not present

## 2023-05-29 DIAGNOSIS — Z7952 Long term (current) use of systemic steroids: Secondary | ICD-10-CM | POA: Diagnosis not present

## 2023-05-29 DIAGNOSIS — E559 Vitamin D deficiency, unspecified: Secondary | ICD-10-CM | POA: Diagnosis not present

## 2023-05-29 DIAGNOSIS — E1169 Type 2 diabetes mellitus with other specified complication: Secondary | ICD-10-CM | POA: Diagnosis not present

## 2023-05-29 DIAGNOSIS — N3020 Other chronic cystitis without hematuria: Secondary | ICD-10-CM | POA: Diagnosis not present

## 2023-05-29 DIAGNOSIS — I10 Essential (primary) hypertension: Secondary | ICD-10-CM | POA: Diagnosis not present

## 2023-05-29 DIAGNOSIS — E782 Mixed hyperlipidemia: Secondary | ICD-10-CM | POA: Diagnosis not present

## 2023-05-29 DIAGNOSIS — Z7985 Long-term (current) use of injectable non-insulin antidiabetic drugs: Secondary | ICD-10-CM | POA: Diagnosis not present

## 2023-05-29 DIAGNOSIS — G7001 Myasthenia gravis with (acute) exacerbation: Secondary | ICD-10-CM | POA: Diagnosis not present

## 2023-05-29 DIAGNOSIS — E538 Deficiency of other specified B group vitamins: Secondary | ICD-10-CM | POA: Diagnosis not present

## 2023-05-29 DIAGNOSIS — Z8673 Personal history of transient ischemic attack (TIA), and cerebral infarction without residual deficits: Secondary | ICD-10-CM | POA: Diagnosis not present

## 2023-05-29 DIAGNOSIS — D689 Coagulation defect, unspecified: Secondary | ICD-10-CM | POA: Diagnosis not present

## 2023-06-01 ENCOUNTER — Ambulatory Visit: Payer: Self-pay | Admitting: Internal Medicine

## 2023-06-01 ENCOUNTER — Other Ambulatory Visit: Payer: Self-pay | Admitting: Internal Medicine

## 2023-06-01 DIAGNOSIS — N3000 Acute cystitis without hematuria: Secondary | ICD-10-CM

## 2023-06-01 MED ORDER — CEPHALEXIN 500 MG PO CAPS: 500.0000 mg | ORAL_CAPSULE | Freq: Three times a day (TID) | ORAL | 0 refills | Status: DC

## 2023-06-01 NOTE — Telephone Encounter (Signed)
  Chief Complaint: Bladder spasms Symptoms: Bladder spasms Frequency: Ongoing since Friday evening Pertinent Negatives: Patient denies fever, dysuria Disposition: [] ED /[] Urgent Care (no appt availability in office) / [x] Appointment(In office/virtual)/ []  [facility]/ [] Home Care/ [] Refused Recommended Disposition /[] [facility]/ []  Follow-up with PCP Additional Notes: Pt reports she has history of cervical cancer and has been taking Azo and Macrobid  ongoing as prescribed. Pt reports since Friday evening she has had an increase and worsening in her bladder spasms and the medications are not helping. Pt denies fever, dysuria, N/V, difficulty urinating. Pt offered OV both in office/virtual and declines citing transportation and not feeling well. Pt advised if she must she will do a virtual visit but declines to schedule at this time and requests a message is sent to the provider. This RN educated pt on new-worsening symptoms, when to call back/seek emergent care. Pt verbalized understanding and agrees to plan. Forwarding to provider for review.   Copied from CRM [id number]. Topic: Clinical - Medical Advice >> Jun 01, 2023 10:26 AM Alfonso ORN wrote: Reason for CRM: patient been sick all weekend and the medication  macrobid  and azo is not helping  the,patient having bladder pain Reason for Disposition  [1] MILD-MODERATE pain AND [2] constant AND [3] present > 2 hours  Answer Assessment - Initial Assessment Questions 1. LOCATION: Where does it hurt?      Bladder pain/spasms 2. RADIATION: Does the pain shoot anywhere else? (e.g., chest, back)     None 3. ONSET: When did the pain begin? (e.g., minutes, hours or days ago)      Ongoing, worsening over the weekend 4. SUDDEN: Gradual or sudden onset?     Ongoing 5. PATTERN Does the pain come and go, or is it constant?    - If it comes and goes: How long does it last? Do you have pain now?     (Note: Comes and goes means  the pain is intermittent. It goes away completely between bouts.)    - If constant: Is it getting better, staying the same, or getting worse?      (Note: Constant means the pain never goes away completely; most serious pain is constant and gets worse.)      Constant 6. SEVERITY: How bad is the pain?  (e.g., Scale 1-10; mild, moderate, or severe)    - MILD (1-3): Doesn't interfere with normal activities, abdomen soft and not tender to touch.     - MODERATE (4-7): Interferes with normal activities or awakens from sleep, abdomen tender to touch.     - SEVERE (8-10): Excruciating pain, doubled over, unable to do any normal activities.       10/10 7. RECURRENT SYMPTOM: Have you ever had this type of stomach pain before? If Yes, ask: When was the last time? and What happened that time?      Yes, pt has history of cervical cancer 8. CAUSE: What do you think is causing the stomach pain?     Bladder spasms 9. RELIEVING/AGGRAVATING FACTORS: What makes it better or worse? (e.g., antacids, bending or twisting motion, bowel movement)     Pt has been taking Azo, Macrobid  as prescribed 10. OTHER SYMPTOMS: Do you have any other symptoms? (e.g., back pain, diarrhea, fever, urination pain, vomiting)       None  Protocols used: Abdominal Pain - Female-A-AH

## 2023-06-01 NOTE — Telephone Encounter (Signed)
  Chief Complaint: bladder spasms/urinary symptoms  Additional Notes: Patient triaged earlier by RN Sarah. Patient requesting clinical advice or follow up from office. Called patient back and she confirmed no new or worsened symptoms since her triage this morning. She is concerned about not having a call back from her PCP office. Spoke with [facility] CAL, Caitlyn. Per CAL they will give patient a call back before end of the day today, once Dr Tobie has reviewed.  Copied from CRM [id number]. Topic: Clinical - Medical Advice >> Jun 01, 2023  1:28 PM Deleta HERO wrote: Reason for CRM: The patient states she has been expecting a callback from the nurses for med advice due to her being sick over the weekend and experiencing bladder pain, she states the medications Azo, Macrobid  are not helping subside the symptoms. The patient is requesting to speak with one of the nurses again. Callback #: [phone]

## 2023-06-03 DIAGNOSIS — Z7984 Long term (current) use of oral hypoglycemic drugs: Secondary | ICD-10-CM | POA: Diagnosis not present

## 2023-06-03 DIAGNOSIS — Z8673 Personal history of transient ischemic attack (TIA), and cerebral infarction without residual deficits: Secondary | ICD-10-CM | POA: Diagnosis not present

## 2023-06-03 DIAGNOSIS — G7001 Myasthenia gravis with (acute) exacerbation: Secondary | ICD-10-CM | POA: Diagnosis not present

## 2023-06-03 DIAGNOSIS — N3020 Other chronic cystitis without hematuria: Secondary | ICD-10-CM | POA: Diagnosis not present

## 2023-06-03 DIAGNOSIS — G4730 Sleep apnea, unspecified: Secondary | ICD-10-CM | POA: Diagnosis not present

## 2023-06-03 DIAGNOSIS — I10 Essential (primary) hypertension: Secondary | ICD-10-CM | POA: Diagnosis not present

## 2023-06-03 DIAGNOSIS — D689 Coagulation defect, unspecified: Secondary | ICD-10-CM | POA: Diagnosis not present

## 2023-06-03 DIAGNOSIS — E559 Vitamin D deficiency, unspecified: Secondary | ICD-10-CM | POA: Diagnosis not present

## 2023-06-03 DIAGNOSIS — E1169 Type 2 diabetes mellitus with other specified complication: Secondary | ICD-10-CM | POA: Diagnosis not present

## 2023-06-03 DIAGNOSIS — E782 Mixed hyperlipidemia: Secondary | ICD-10-CM | POA: Diagnosis not present

## 2023-06-03 DIAGNOSIS — Z7952 Long term (current) use of systemic steroids: Secondary | ICD-10-CM | POA: Diagnosis not present

## 2023-06-03 DIAGNOSIS — E538 Deficiency of other specified B group vitamins: Secondary | ICD-10-CM | POA: Diagnosis not present

## 2023-06-03 DIAGNOSIS — Z7985 Long-term (current) use of injectable non-insulin antidiabetic drugs: Secondary | ICD-10-CM | POA: Diagnosis not present

## 2023-06-04 DIAGNOSIS — Z8673 Personal history of transient ischemic attack (TIA), and cerebral infarction without residual deficits: Secondary | ICD-10-CM | POA: Diagnosis not present

## 2023-06-04 DIAGNOSIS — G4730 Sleep apnea, unspecified: Secondary | ICD-10-CM | POA: Diagnosis not present

## 2023-06-04 DIAGNOSIS — E538 Deficiency of other specified B group vitamins: Secondary | ICD-10-CM | POA: Diagnosis not present

## 2023-06-04 DIAGNOSIS — E559 Vitamin D deficiency, unspecified: Secondary | ICD-10-CM | POA: Diagnosis not present

## 2023-06-04 DIAGNOSIS — I10 Essential (primary) hypertension: Secondary | ICD-10-CM | POA: Diagnosis not present

## 2023-06-04 DIAGNOSIS — G7001 Myasthenia gravis with (acute) exacerbation: Secondary | ICD-10-CM | POA: Diagnosis not present

## 2023-06-04 DIAGNOSIS — E782 Mixed hyperlipidemia: Secondary | ICD-10-CM | POA: Diagnosis not present

## 2023-06-04 DIAGNOSIS — Z7984 Long term (current) use of oral hypoglycemic drugs: Secondary | ICD-10-CM | POA: Diagnosis not present

## 2023-06-04 DIAGNOSIS — D689 Coagulation defect, unspecified: Secondary | ICD-10-CM | POA: Diagnosis not present

## 2023-06-04 DIAGNOSIS — E1169 Type 2 diabetes mellitus with other specified complication: Secondary | ICD-10-CM | POA: Diagnosis not present

## 2023-06-04 DIAGNOSIS — N3020 Other chronic cystitis without hematuria: Secondary | ICD-10-CM | POA: Diagnosis not present

## 2023-06-04 DIAGNOSIS — Z7985 Long-term (current) use of injectable non-insulin antidiabetic drugs: Secondary | ICD-10-CM | POA: Diagnosis not present

## 2023-06-04 DIAGNOSIS — Z7952 Long term (current) use of systemic steroids: Secondary | ICD-10-CM | POA: Diagnosis not present

## 2023-06-05 DIAGNOSIS — N3020 Other chronic cystitis without hematuria: Secondary | ICD-10-CM | POA: Diagnosis not present

## 2023-06-05 DIAGNOSIS — E1169 Type 2 diabetes mellitus with other specified complication: Secondary | ICD-10-CM | POA: Diagnosis not present

## 2023-06-05 DIAGNOSIS — Z8673 Personal history of transient ischemic attack (TIA), and cerebral infarction without residual deficits: Secondary | ICD-10-CM | POA: Diagnosis not present

## 2023-06-05 DIAGNOSIS — E538 Deficiency of other specified B group vitamins: Secondary | ICD-10-CM | POA: Diagnosis not present

## 2023-06-05 DIAGNOSIS — E559 Vitamin D deficiency, unspecified: Secondary | ICD-10-CM | POA: Diagnosis not present

## 2023-06-05 DIAGNOSIS — I10 Essential (primary) hypertension: Secondary | ICD-10-CM | POA: Diagnosis not present

## 2023-06-05 DIAGNOSIS — E782 Mixed hyperlipidemia: Secondary | ICD-10-CM | POA: Diagnosis not present

## 2023-06-05 DIAGNOSIS — G4730 Sleep apnea, unspecified: Secondary | ICD-10-CM | POA: Diagnosis not present

## 2023-06-05 DIAGNOSIS — Z7985 Long-term (current) use of injectable non-insulin antidiabetic drugs: Secondary | ICD-10-CM | POA: Diagnosis not present

## 2023-06-05 DIAGNOSIS — G7001 Myasthenia gravis with (acute) exacerbation: Secondary | ICD-10-CM | POA: Diagnosis not present

## 2023-06-05 DIAGNOSIS — Z7984 Long term (current) use of oral hypoglycemic drugs: Secondary | ICD-10-CM | POA: Diagnosis not present

## 2023-06-05 DIAGNOSIS — D689 Coagulation defect, unspecified: Secondary | ICD-10-CM | POA: Diagnosis not present

## 2023-06-05 DIAGNOSIS — Z7952 Long term (current) use of systemic steroids: Secondary | ICD-10-CM | POA: Diagnosis not present

## 2023-06-05 NOTE — Telephone Encounter (Signed)
Copied from CRM [id number]. Topic: Clinical - Home Health Verbal Orders >> Jun 05, 2023  8:36 AM Geroge Baseman wrote: Caller/Agency: [facility])  Callback Number: [phone] Service Requested: Occupational Therapy Frequency: Patient is doing physical therapy and occupational therapy now, and wants to postpone occupational therapy until further notice. Any new concerns about the patient? No

## 2023-06-08 ENCOUNTER — Telehealth: Payer: Self-pay | Admitting: Neurology

## 2023-06-08 NOTE — Telephone Encounter (Signed)
Pt left message with after hours service. Pt rcvd missed call from our office

## 2023-06-09 NOTE — Telephone Encounter (Signed)
Spoke to pt let her know it was not found in our office she will have to call pharamacy for a refill.

## 2023-06-09 NOTE — Telephone Encounter (Signed)
Copied from CRM [id number]. Topic: Clinical - Prescription Issue >> Jun 09, 2023  2:18 PM Geroge Baseman wrote: Reason for CRM: Freestyle libre 3 sensor missing. She thinks she may have left in the office, wants to know if they were found. She wants a refill but I'm showing she already has refills at the pharmacy.

## 2023-06-10 NOTE — Telephone Encounter (Signed)
Pt called in and left a message. She is returning a call from [facility]

## 2023-06-10 NOTE — Telephone Encounter (Signed)
Called Patient and left voice mail per Dr. Jorge Ny message, and that I was still looking for a PT that would come to her home, and that there is one in town [facility]. Please call office.

## 2023-06-10 NOTE — Telephone Encounter (Signed)
Called and was no answer to phone. No voice mail

## 2023-06-12 DIAGNOSIS — E559 Vitamin D deficiency, unspecified: Secondary | ICD-10-CM | POA: Diagnosis not present

## 2023-06-12 DIAGNOSIS — E782 Mixed hyperlipidemia: Secondary | ICD-10-CM | POA: Diagnosis not present

## 2023-06-12 DIAGNOSIS — D689 Coagulation defect, unspecified: Secondary | ICD-10-CM | POA: Diagnosis not present

## 2023-06-12 DIAGNOSIS — Z7985 Long-term (current) use of injectable non-insulin antidiabetic drugs: Secondary | ICD-10-CM | POA: Diagnosis not present

## 2023-06-12 DIAGNOSIS — E538 Deficiency of other specified B group vitamins: Secondary | ICD-10-CM | POA: Diagnosis not present

## 2023-06-12 DIAGNOSIS — I10 Essential (primary) hypertension: Secondary | ICD-10-CM | POA: Diagnosis not present

## 2023-06-12 DIAGNOSIS — Z8673 Personal history of transient ischemic attack (TIA), and cerebral infarction without residual deficits: Secondary | ICD-10-CM | POA: Diagnosis not present

## 2023-06-12 DIAGNOSIS — E1169 Type 2 diabetes mellitus with other specified complication: Secondary | ICD-10-CM | POA: Diagnosis not present

## 2023-06-12 DIAGNOSIS — Z7952 Long term (current) use of systemic steroids: Secondary | ICD-10-CM | POA: Diagnosis not present

## 2023-06-12 DIAGNOSIS — G4730 Sleep apnea, unspecified: Secondary | ICD-10-CM | POA: Diagnosis not present

## 2023-06-12 DIAGNOSIS — N3020 Other chronic cystitis without hematuria: Secondary | ICD-10-CM | POA: Diagnosis not present

## 2023-06-12 DIAGNOSIS — G7001 Myasthenia gravis with (acute) exacerbation: Secondary | ICD-10-CM | POA: Diagnosis not present

## 2023-06-12 DIAGNOSIS — Z7984 Long term (current) use of oral hypoglycemic drugs: Secondary | ICD-10-CM | POA: Diagnosis not present

## 2023-06-16 DIAGNOSIS — Z7952 Long term (current) use of systemic steroids: Secondary | ICD-10-CM | POA: Diagnosis not present

## 2023-06-16 DIAGNOSIS — G4730 Sleep apnea, unspecified: Secondary | ICD-10-CM | POA: Diagnosis not present

## 2023-06-16 DIAGNOSIS — Z7985 Long-term (current) use of injectable non-insulin antidiabetic drugs: Secondary | ICD-10-CM | POA: Diagnosis not present

## 2023-06-16 DIAGNOSIS — Z7984 Long term (current) use of oral hypoglycemic drugs: Secondary | ICD-10-CM | POA: Diagnosis not present

## 2023-06-16 DIAGNOSIS — I10 Essential (primary) hypertension: Secondary | ICD-10-CM | POA: Diagnosis not present

## 2023-06-16 DIAGNOSIS — G7001 Myasthenia gravis with (acute) exacerbation: Secondary | ICD-10-CM | POA: Diagnosis not present

## 2023-06-16 DIAGNOSIS — E559 Vitamin D deficiency, unspecified: Secondary | ICD-10-CM | POA: Diagnosis not present

## 2023-06-16 DIAGNOSIS — Z8673 Personal history of transient ischemic attack (TIA), and cerebral infarction without residual deficits: Secondary | ICD-10-CM | POA: Diagnosis not present

## 2023-06-16 DIAGNOSIS — E1169 Type 2 diabetes mellitus with other specified complication: Secondary | ICD-10-CM | POA: Diagnosis not present

## 2023-06-16 DIAGNOSIS — E782 Mixed hyperlipidemia: Secondary | ICD-10-CM | POA: Diagnosis not present

## 2023-06-16 DIAGNOSIS — E538 Deficiency of other specified B group vitamins: Secondary | ICD-10-CM | POA: Diagnosis not present

## 2023-06-16 DIAGNOSIS — D689 Coagulation defect, unspecified: Secondary | ICD-10-CM | POA: Diagnosis not present

## 2023-06-16 DIAGNOSIS — N3020 Other chronic cystitis without hematuria: Secondary | ICD-10-CM | POA: Diagnosis not present

## 2023-06-17 NOTE — Telephone Encounter (Signed)
Notes were faxed today to [facility].

## 2023-06-19 ENCOUNTER — Other Ambulatory Visit: Payer: Self-pay | Admitting: Internal Medicine

## 2023-06-19 DIAGNOSIS — Z7984 Long term (current) use of oral hypoglycemic drugs: Secondary | ICD-10-CM | POA: Diagnosis not present

## 2023-06-19 DIAGNOSIS — E1169 Type 2 diabetes mellitus with other specified complication: Secondary | ICD-10-CM | POA: Diagnosis not present

## 2023-06-19 DIAGNOSIS — Z7985 Long-term (current) use of injectable non-insulin antidiabetic drugs: Secondary | ICD-10-CM | POA: Diagnosis not present

## 2023-06-19 DIAGNOSIS — G4730 Sleep apnea, unspecified: Secondary | ICD-10-CM | POA: Diagnosis not present

## 2023-06-19 DIAGNOSIS — E559 Vitamin D deficiency, unspecified: Secondary | ICD-10-CM | POA: Diagnosis not present

## 2023-06-19 DIAGNOSIS — I10 Essential (primary) hypertension: Secondary | ICD-10-CM | POA: Diagnosis not present

## 2023-06-19 DIAGNOSIS — G7001 Myasthenia gravis with (acute) exacerbation: Secondary | ICD-10-CM | POA: Diagnosis not present

## 2023-06-19 DIAGNOSIS — E782 Mixed hyperlipidemia: Secondary | ICD-10-CM | POA: Diagnosis not present

## 2023-06-19 DIAGNOSIS — Z7952 Long term (current) use of systemic steroids: Secondary | ICD-10-CM | POA: Diagnosis not present

## 2023-06-19 DIAGNOSIS — N3020 Other chronic cystitis without hematuria: Secondary | ICD-10-CM | POA: Diagnosis not present

## 2023-06-19 DIAGNOSIS — E538 Deficiency of other specified B group vitamins: Secondary | ICD-10-CM | POA: Diagnosis not present

## 2023-06-19 DIAGNOSIS — D689 Coagulation defect, unspecified: Secondary | ICD-10-CM | POA: Diagnosis not present

## 2023-06-19 DIAGNOSIS — Z8673 Personal history of transient ischemic attack (TIA), and cerebral infarction without residual deficits: Secondary | ICD-10-CM | POA: Diagnosis not present

## 2023-06-23 ENCOUNTER — Encounter: Payer: Self-pay | Admitting: Internal Medicine

## 2023-06-23 ENCOUNTER — Ambulatory Visit: Payer: 59 | Admitting: Internal Medicine

## 2023-06-23 VITALS — BP 128/68 | HR 78 | Ht 64.5 in | Wt 123.8 lb

## 2023-06-23 DIAGNOSIS — I10 Essential (primary) hypertension: Secondary | ICD-10-CM | POA: Diagnosis not present

## 2023-06-23 DIAGNOSIS — E1169 Type 2 diabetes mellitus with other specified complication: Secondary | ICD-10-CM | POA: Diagnosis not present

## 2023-06-23 DIAGNOSIS — G7000 Myasthenia gravis without (acute) exacerbation: Secondary | ICD-10-CM | POA: Diagnosis not present

## 2023-06-23 DIAGNOSIS — Z7985 Long-term (current) use of injectable non-insulin antidiabetic drugs: Secondary | ICD-10-CM

## 2023-06-23 DIAGNOSIS — F339 Major depressive disorder, recurrent, unspecified: Secondary | ICD-10-CM

## 2023-06-23 DIAGNOSIS — E785 Hyperlipidemia, unspecified: Secondary | ICD-10-CM

## 2023-06-23 DIAGNOSIS — B351 Tinea unguium: Secondary | ICD-10-CM | POA: Diagnosis not present

## 2023-06-23 DIAGNOSIS — E538 Deficiency of other specified B group vitamins: Secondary | ICD-10-CM

## 2023-06-23 DIAGNOSIS — F331 Major depressive disorder, recurrent, moderate: Secondary | ICD-10-CM

## 2023-06-23 MED ORDER — DULOXETINE HCL 20 MG PO CPEP: 20.0000 mg | ORAL_CAPSULE | Freq: Every day | ORAL | 1 refills | Status: DC

## 2023-06-23 MED ORDER — CYANOCOBALAMIN 1000 MCG/ML IJ SOLN
1000.0000 ug | Freq: Once | INTRAMUSCULAR | Status: AC
  Administered 2023-06-23: 1000 ug via INTRAMUSCULAR

## 2023-06-23 MED ORDER — OZEMPIC (0.25 OR 0.5 MG/DOSE) 2 MG/3ML ~~LOC~~ SOPN: 0.5000 mg | PEN_INJECTOR | SUBCUTANEOUS | 3 refills | Status: DC

## 2023-06-23 MED ORDER — TERBINAFINE HCL 250 MG PO TABS: 250.0000 mg | ORAL_TABLET | Freq: Every day | ORAL | 0 refills | Status: DC

## 2023-06-23 NOTE — Patient Instructions (Addendum)
 Please schedule Medicare Annual Wellness visit.  Please start taking Ozempic  0.5 mg once weekly instead of 1 mg once you receive it.  Please start taking Terbinafine  for toenail infection.  Please continue taking Duloxetine  20 mg for anxiety.  Please continue to take other medications as prescribed.  Please continue to follow low carb diet and perform moderate exercise/walking as tolerated.

## 2023-06-23 NOTE — Progress Notes (Signed)
 Established Patient Office Visit  Subjective:  Patient ID: Jessica Mclean, female    DOB: August 04, 1945  Age: 78 y.o. MRN: [id number]  CC:  Chief Complaint  Patient presents with   Care Management    Needs medications reviewed.    Diabetes   Depression   Hypertension    HPI Jessica Mclean is a 79 y.o. female with past medical history of HTN, DM2, TIA, HLD and cervical ca. s/p radiation who presents for f/u of her chronic medical conditions. She has brought her home medications for review.  Interval summary: She has started taking her medications regularly now. Eats at regular intervals. Her weight has been stable at 123 lbs. Has had 1 minor fall at home, but no major injury including head injury.   Type II DM: She has stopped taking Metformin , but apparently has continued taking Ozempic . Of note, she admits that she has only 1 meal in a day. She has the Dexcom G7, but has difficulty connecting it to her phone.  She had nursing visit to help her connect it to her phone and had taught it to her as well, but still has difficulty connecting with it and checking blood glucose with it.  She prefers to use [employer] as she was able to use it better in the past. She has not checked blood glucose with regular fingerstick recently.  Of note, she has been taking prednisone  7.5 mg daily for myasthenia gravis.   HTN: BP is well-controlled. Takes medications regularly. Patient denies headache, chest pain, or palpitations.   Myasthenia gravis: Followed by neurology.  She is getting IV Ig infusions and is on Mestinon , Azathioprine  and oral prednisone  currently. She had hospitalization in 11/24 for severe weakness, but was thought to be due to B12 deficiency rather than myasthenia gravis crisis. She still has weakness and has gait disturbance. She has noticed improvement in visual disturbance and speech difficulty with myasthenia gravis treatment. She has history of CVA in the past.  Denies any new  focal neurologic deficit. She has chronic weakness of the b/l LE.   Chronic cystitis: She has been taking Pyridium  for dysuria. Denies any hematuria, flank or pelvic pain currently.   MDD: She has been feeling depressed since her daughter moved to [place] .  She has restarted taking duloxetine  20 mg QD, which she found from her old prescription.  Her crying spells and anxiety symptoms have improved compared to previous visit.  Denies SI or HI currently.      Past Medical History:  Diagnosis Date   Bleeding disorder (HCC)    Cervical cancer (HCC) 2010   Diabetes mellitus without complication (HCC)    type 2   Diabetes mellitus without complication (HCC)    Heart murmur    High cholesterol    HLD (hyperlipidemia)    Hypertension    Mental disorder    depression; suicidal ideation in 2017   Sleep apnea    Vitamin D  deficiency     Past Surgical History:  Procedure Laterality Date   BILATERAL OOPHORECTOMY     CESAREAN SECTION     x 4   CHOLECYSTECTOMY     COLOSTOMY     x2   CYSTOSCOPY WITH URETHRAL DILATATION N/A 11/15/2020   Procedure: CYSTOSCOPY WITH URETHRAL DILATION;  Surgeon: Sherrilee Belvie CROME, MD;  Location: [facility];  Service: Urology;  Laterality: N/A;   SALPINGECTOMY     TONSILLECTOMY     TONSILLECTOMY AND ADENOIDECTOMY  Family History  Problem Relation Age of Onset   Cancer Father        kidney   Cancer Mother        lung   Other Brother        heart issues    Social History   Socioeconomic History   Marital status: Divorced    Spouse name: Not on file   Number of children: 3   Years of education: Not on file   Highest education level: Not on file  Occupational History   Occupation: retired  Tobacco Use   Smoking status: Never   Smokeless tobacco: Never  Vaping Use   Vaping status: Never Used  Substance and Sexual Activity   Alcohol use: Never   Drug use: Never   Sexual activity: Not Currently    Birth control/protection: Post-menopausal   Other Topics Concern   Not on file  Social History Narrative   ** Merged History Encounter **    Right handed   Leave alone-1 level home   Caffeine--soda/coffee       Are you currently employed ?    What is your current occupation? retired   Do you live at home alone?yes   Who lives with you?           Social Drivers of Health   Financial Resource Strain: Medium Risk (07/31/2020)   Overall Financial Resource Strain (CARDIA)    Difficulty of Paying Living Expenses: Somewhat hard  Food Insecurity: No Food Insecurity (04/09/2023)   Hunger Vital Sign    Worried About Running Out of Food in the Last Year: Never true    Ran Out of Food in the Last Year: Never true  Transportation Needs: No Transportation Needs (04/09/2023)   PRAPARE - Administrator, Civil Service (Medical): No    Lack of Transportation (Non-Medical): No  Physical Activity: Insufficiently Active (07/31/2020)   Exercise Vital Sign    Days of Exercise per Week: 4 days    Minutes of Exercise per Session: 30 min  Stress: No Stress Concern Present (07/31/2020)   [employer] - Occupational Stress Questionnaire    Feeling of Stress : Only a little  Social Connections: Unknown (10/01/2021)   Received from [facility], [facility]   Social Network    Social Network: Not on file  Intimate Partner Violence: Not At Risk (04/09/2023)   Humiliation, Afraid, Rape, and Kick questionnaire    Fear of Current or Ex-Partner: No    Emotionally Abused: No    Physically Abused: No    Sexually Abused: No    Outpatient Medications Prior to Visit  Medication Sig Dispense Refill   ACCU-CHEK GUIDE test strip USE TO CHECK BLOOD SUGAR 4 TIMES DAILY 400 strip 2   Accu-Chek Softclix Lancets lancets USE TO CHECK BLOOD SUGAR 4 TIMES DAILY 400 each 2   amLODipine  (NORVASC ) 10 MG tablet Take 1 tablet (10 mg total) by mouth daily. 100 tablet 2   aspirin -acetaminophen -caffeine (EXCEDRIN EXTRA  STRENGTH) 250-250-65 MG tablet Take 2 tablets by mouth in the morning.     atorvastatin  (LIPITOR) 80 MG tablet Take 1 tablet (80 mg total) by mouth daily. 100 tablet 2   azaTHIOprine  (IMURAN ) 50 MG tablet Take 2 tablets (100 mg total) by mouth daily. Take 1 tablet (50 mg) for 1 week, then increase to 2 tablets (100 mg) thereafter. 60 tablet 11   blood glucose meter kit and supplies Dispense based on patient  and insurance preference. Use up to four times daily as directed. (FOR ICD-10 E10.9, E11.9). 1 each 0   Blood Glucose Monitoring Suppl (ACCU-CHEK GUIDE) w/Device KIT      calcium -vitamin D  (OSCAL-500) 500-400 MG-UNIT tablet Take 1 tablet by mouth daily.     cephALEXin  (KEFLEX ) 500 MG capsule Take 1 capsule (500 mg total) by mouth 3 (three) times daily. 15 capsule 0   Coenzyme Q10 (COQ-10 PO) Take by mouth.     Collagen-Vitamin C-Biotin (COLLAGEN PO) Take by mouth.     FLAXSEED, LINSEED, PO Take by mouth.     KRILL OIL PO Take by mouth.     meclizine  (ANTIVERT ) 25 MG tablet Take 1 tablet (25 mg total) by mouth 3 (three) times daily as needed for dizziness. 30 tablet 1   Melatonin-Pyridoxine (MELATIN PO) Take 10 mg by mouth.     metFORMIN  (GLUCOPHAGE ) 1000 MG tablet Take 1 tablet (1,000 mg total) by mouth 2 (two) times daily with a meal. 200 tablet 2   Multiple Vitamins-Minerals (PRESERVISION AREDS PO) Take 2 capsules by mouth daily.     omega-3 acid ethyl esters (LOVAZA ) 1 g capsule TAKE 2 CAPSULES BY MOUTH TWICE  DAILY 180 capsule 7   phenazopyridine  (PYRIDIUM ) 100 MG tablet Take 1 tablet (100 mg total) by mouth 3 (three) times daily as needed for pain (Dysuria). 90 tablet 5   predniSONE  (DELTASONE ) 2.5 MG tablet Take 3 tablets (7.5 mg total) by mouth daily with breakfast. 90 tablet 5   pyridostigmine  (MESTINON ) 60 MG tablet TAKE 1 TABLET BY MOUTH 3 TIMES  DAILY 100 tablet 9   telmisartan  (MICARDIS ) 40 MG tablet Take 1 tablet (40 mg total) by mouth daily. 100 tablet 2   vitamin B-12  (CYANOCOBALAMIN ) 100 MCG tablet Take 1 tablet (100 mcg total) by mouth daily. 90 tablet 3   DULoxetine  HCl 60 MG CSDR Take 1 capsule by mouth daily.     Semaglutide , 1 MG/DOSE, 4 MG/3ML SOPN Inject 1 mg as directed once a week. 9 mL 1   terbinafine  (LAMISIL ) 250 MG tablet Take 250 mg by mouth daily.     albuterol  (VENTOLIN  HFA) 108 (90 Base) MCG/ACT inhaler Inhale 2 puffs into the lungs every 6 (six) hours as needed for wheezing or shortness of breath. (Patient not taking: Reported on 06/23/2023) 18 g 1   Continuous Glucose Receiver (FREESTYLE LIBRE 3 READER) DEVI Use it to check blood glucose as instructed. (Patient not taking: Reported on 06/23/2023) 1 each 0   Continuous Glucose Sensor (FREESTYLE LIBRE 3 SENSOR) MISC Use it to check blood glucose as instructed. (Patient not taking: Reported on 06/23/2023) 2 each 5   No facility-administered medications prior to visit.    Allergies  Allergen Reactions   Other     Nickel staples   Poison Ivy Treatments     Poison Ivy     ROS Review of Systems  Constitutional:  Positive for fatigue. Negative for chills and fever.  HENT:  Positive for voice change. Negative for congestion, sinus pressure, sinus pain and sore throat.   Eyes:  Negative for pain and discharge.  Respiratory:  Negative for cough and shortness of breath.   Cardiovascular:  Negative for chest pain and palpitations.  Gastrointestinal:  Negative for blood in stool, nausea and vomiting.  Endocrine: Negative for polydipsia and polyuria.  Genitourinary:  Positive for urgency. Negative for dysuria, flank pain, frequency and hematuria.  Musculoskeletal:  Negative for neck pain and neck stiffness.  Skin:  Negative for rash.  Neurological:  Positive for dizziness, speech difficulty and weakness.  Psychiatric/Behavioral:  Positive for dysphoric mood and sleep disturbance. Negative for agitation and behavioral problems. The patient is nervous/anxious.       Objective:    Physical  Exam Vitals reviewed.  Constitutional:      General: She is not in acute distress.    Appearance: She is not diaphoretic.  HENT:     Head: Normocephalic and atraumatic.     Nose: Nose normal.     Mouth/Throat:     Mouth: Mucous membranes are moist.  Eyes:     General: No scleral icterus.    Extraocular Movements: Extraocular movements intact.  Cardiovascular:     Rate and Rhythm: Normal rate and regular rhythm.     Pulses: Normal pulses.     Heart sounds: Normal heart sounds. No murmur heard. Pulmonary:     Breath sounds: No wheezing or rales.  Musculoskeletal:     Cervical back: Neck supple. No tenderness.     Right lower leg: No edema.     Left lower leg: No edema.  Feet:     Right foot:     Toenail Condition: Right toenails are abnormally thick. Fungal disease present.    Left foot:     Toenail Condition: Left toenails are abnormally thick.  Skin:    General: Skin is warm.     Findings: No rash.  Neurological:     General: No focal deficit present.     Mental Status: She is alert and oriented to person, place, and time.     Sensory: Sensory deficit (B/l feet) present.     Motor: Weakness (B/l UE and LE - 4/5) present.     Gait: Gait abnormal.     Comments: Slurred speech  Psychiatric:        Mood and Affect: Mood normal.        Behavior: Behavior normal.     BP 128/68 (BP Location: Left Arm)   Pulse 78   Ht 5' 4.5 (1.638 m)   Wt 123 lb 12.8 oz (56.2 kg)   LMP  (LMP Unknown)   SpO2 98%   BMI 20.92 kg/m  Wt Readings from Last 3 Encounters:  06/23/23 123 lb 12.8 oz (56.2 kg)  05/21/23 122 lb 9.6 oz (55.6 kg)  05/12/23 121 lb (54.9 kg)    Lab Results  Component Value Date   TSH 0.484 04/08/2023   Lab Results  Component Value Date   WBC 5.5 06/23/2023   HGB 9.2 (L) 06/23/2023   HCT 29.3 (L) 06/23/2023   MCV 88 06/23/2023   PLT 309 06/23/2023   Lab Results  Component Value Date   NA 139 06/23/2023   K 4.5 06/23/2023   CO2 24 06/23/2023    GLUCOSE 115 (H) 06/23/2023   BUN 15 06/23/2023   CREATININE 0.82 06/23/2023   BILITOT <0.2 06/23/2023   ALKPHOS 63 06/23/2023   AST 13 06/23/2023   ALT 9 06/23/2023   PROT 7.1 06/23/2023   ALBUMIN 3.9 06/23/2023   CALCIUM  9.2 06/23/2023   ANIONGAP 7 04/09/2023   EGFR 73 06/23/2023   Lab Results  Component Value Date   CHOL 185 09/17/2022   Lab Results  Component Value Date   HDL 52 09/17/2022   Lab Results  Component Value Date   LDLCALC 105 (H) 09/17/2022   Lab Results  Component Value Date   TRIG 159 (H) 09/17/2022  Lab Results  Component Value Date   CHOLHDL 3.6 09/17/2022   Lab Results  Component Value Date   HGBA1C 6.2 (H) 06/23/2023      Assessment & Plan:   Problem List Items Addressed This Visit       Cardiovascular and Mediastinum   Essential hypertension   BP Readings from Last 1 Encounters:  06/23/23 128/68   Well-controlled with telmisartan  40 mg daily and amlodipine  10 mg daily now Counseled for compliance with the medications Advised DASH diet and moderate exercise/walking as tolerated      Relevant Orders   CBC with Differential/Platelet (Completed)   CMP14+EGFR (Completed)     Endocrine   Type 2 diabetes mellitus with hyperlipidemia (HCC) - Primary   Lab Results  Component Value Date   HGBA1C 6.7 (H) 04/08/2023   Well-controlled Decreased dose of Ozempic  to 0.5 mg qw for now, would be cautious with weight loss Since she is on oral prednisone  now, continue metformin  1000 mg twice daily Needs CGM to monitor for hypoglycemia - sent Freestyle libre Rx Advised to follow diabetic diet On ARB and statin Diabetic eye exam: Advised to follow up with Ophthalmology for diabetic eye exam      Relevant Medications   Semaglutide ,0.25 or 0.5MG /DOS, (OZEMPIC , 0.25 OR 0.5 MG/DOSE,) 2 MG/3ML SOPN   Other Relevant Orders   Microalbumin / creatinine urine ratio (Completed)   CMP14+EGFR (Completed)   Hemoglobin A1c (Completed)     Nervous  and Auditory   Myasthenia gravis without acute exacerbation (HCC)   Followed by neurology S/p IVIG treatment Has started Mestinon , Azathioprine  and oral prednisone  - symptoms had improved initially Her current weakness is due to nutritional deficiencies and myasthenia gravis -symptoms improved since restarting oral prednisone  B12 injection today      Relevant Medications   DULoxetine  (CYMBALTA ) 20 MG capsule   Other Relevant Orders   CBC with Differential/Platelet (Completed)     Musculoskeletal and Integument   Onychomycosis   Yellowish discoloration of right great toenail Started terbinafine  for 12 weeks      Relevant Medications   terbinafine  (LAMISIL ) 250 MG tablet     Other   Depression, recurrent (HCC)   Uncontrolled, but improving Her daughter recently moved to [place], which has caused adjustment concern versus recurrence of depression symptoms Restart Cymbalta  20 mg QD, new prescription sent      Relevant Medications   DULoxetine  (CYMBALTA ) 20 MG capsule   B12 deficiency   Vit B12: 153 Vit B12 injection today Advised to take vitamin B12 1000 mcg QD       Meds ordered this encounter  Medications   terbinafine  (LAMISIL ) 250 MG tablet    Sig: Take 1 tablet (250 mg total) by mouth daily.    Dispense:  84 tablet    Refill:  0   DULoxetine  (CYMBALTA ) 20 MG capsule    Sig: Take 1 capsule (20 mg total) by mouth daily.    Dispense:  90 capsule    Refill:  1   Semaglutide ,0.25 or 0.5MG /DOS, (OZEMPIC , 0.25 OR 0.5 MG/DOSE,) 2 MG/3ML SOPN    Sig: Inject 0.5 mg into the skin every 7 (seven) days.    Dispense:  9 mL    Refill:  3    Please cancel Ozempic  1 mg prescription.   cyanocobalamin  (VITAMIN B12) injection 1,000 mcg    Follow-up: Return in about 3 months (around 09/20/2023) for DM and HTN.    Suzzane MARLA Blanch, MD

## 2023-06-23 NOTE — Assessment & Plan Note (Addendum)
 Lab Results  Component Value Date   HGBA1C 6.7 (H) 04/08/2023   Well-controlled Decreased dose of Ozempic  to 0.5 mg qw for now, would be cautious with weight loss Since she is on oral prednisone  now, continue metformin  1000 mg twice daily Needs CGM to monitor for hypoglycemia - sent Freestyle libre Rx Advised to follow diabetic diet On ARB and statin Diabetic eye exam: Advised to follow up with Ophthalmology for diabetic eye exam

## 2023-06-24 LAB — CMP14+EGFR
ALT: 9 [IU]/L (ref 0–32)
AST: 13 [IU]/L (ref 0–40)
Albumin: 3.9 g/dL (ref 3.8–4.8)
Alkaline Phosphatase: 63 [IU]/L (ref 44–121)
BUN/Creatinine Ratio: 18 (ref 12–28)
BUN: 15 mg/dL (ref 8–27)
Bilirubin Total: <0.2 mg/dL (ref 0.0–1.2)
CO2: 24 mmol/L (ref 20–29)
Calcium: 9.2 mg/dL (ref 8.7–10.3)
Chloride: 100 mmol/L (ref 96–106)
Creatinine, Ser: 0.82 mg/dL (ref 0.57–1.00)
Globulin, Total: 3.2 g/dL (ref 1.5–4.5)
Glucose: 115 mg/dL — ABNORMAL HIGH (ref 70–99)
Potassium: 4.5 mmol/L (ref 3.5–5.2)
Sodium: 139 mmol/L (ref 134–144)
Total Protein: 7.1 g/dL (ref 6.0–8.5)
eGFR: 73 mL/min/{1.73_m2} (ref >59)

## 2023-06-24 LAB — CBC WITH DIFFERENTIAL/PLATELET
Basophils Absolute: 0.1 10*3/uL (ref 0.0–0.2)
Basos: 1 %
EOS (ABSOLUTE): 0 10*3/uL (ref 0.0–0.4)
Eos: 0 %
Hematocrit: 29.3 % — ABNORMAL LOW (ref 34.0–46.6)
Hemoglobin: 9.2 g/dL — ABNORMAL LOW (ref 11.1–15.9)
Immature Grans (Abs): 0 10*3/uL (ref 0.0–0.1)
Immature Granulocytes: 0 %
Lymphocytes Absolute: 1 10*3/uL (ref 0.7–3.1)
Lymphs: 18 %
MCH: 27.5 pg (ref 26.6–33.0)
MCHC: 31.4 g/dL — ABNORMAL LOW (ref 31.5–35.7)
MCV: 88 fL (ref 79–97)
Monocytes Absolute: 0.4 10*3/uL (ref 0.1–0.9)
Monocytes: 8 %
Neutrophils Absolute: 4 10*3/uL (ref 1.4–7.0)
Neutrophils: 73 %
Platelets: 309 10*3/uL (ref 150–450)
RBC: 3.35 x10E6/uL — ABNORMAL LOW (ref 3.77–5.28)
RDW: 16.4 % — ABNORMAL HIGH (ref 11.7–15.4)
WBC: 5.5 10*3/uL (ref 3.4–10.8)

## 2023-06-24 LAB — HEMOGLOBIN A1C
Est. average glucose Bld gHb Est-mCnc: 131 mg/dL
Hgb A1c MFr Bld: 6.2 % — ABNORMAL HIGH (ref 4.8–5.6)

## 2023-06-25 LAB — MICROALBUMIN / CREATININE URINE RATIO
Creatinine, Urine: 169.6 mg/dL
Microalb/Creat Ratio: 55 mg/g{creat} — ABNORMAL HIGH (ref 0–29)
Microalbumin, Urine: 92.6 ug/mL

## 2023-06-26 DIAGNOSIS — B351 Tinea unguium: Secondary | ICD-10-CM | POA: Insufficient documentation

## 2023-06-26 NOTE — Assessment & Plan Note (Signed)
 Uncontrolled, but improving Her daughter recently moved to [facility], which has caused adjustment concern versus recurrence of depression symptoms Restart Cymbalta  20 mg QD, new prescription sent

## 2023-06-26 NOTE — Assessment & Plan Note (Signed)
 Followed by neurology S/p IVIG treatment Has started Mestinon , Azathioprine  and oral prednisone  - symptoms had improved initially Her current weakness is due to nutritional deficiencies and myasthenia gravis -symptoms improved since restarting oral prednisone  B12 injection today

## 2023-06-26 NOTE — Assessment & Plan Note (Addendum)
 Yellowish discoloration of right great toenail Started terbinafine  for 12 weeks

## 2023-06-26 NOTE — Assessment & Plan Note (Addendum)
 Vit B12: 153 Vit B12 injection today Advised to take vitamin B12 1000 mcg QD

## 2023-06-26 NOTE — Assessment & Plan Note (Signed)
 BP Readings from Last 1 Encounters:  06/23/23 128/68   Well-controlled with telmisartan  40 mg daily and amlodipine  10 mg daily now Counseled for compliance with the medications Advised DASH diet and moderate exercise/walking as tolerated

## 2023-07-01 DIAGNOSIS — D689 Coagulation defect, unspecified: Secondary | ICD-10-CM | POA: Diagnosis not present

## 2023-07-01 DIAGNOSIS — Z7952 Long term (current) use of systemic steroids: Secondary | ICD-10-CM | POA: Diagnosis not present

## 2023-07-01 DIAGNOSIS — Z8673 Personal history of transient ischemic attack (TIA), and cerebral infarction without residual deficits: Secondary | ICD-10-CM | POA: Diagnosis not present

## 2023-07-01 DIAGNOSIS — Z7985 Long-term (current) use of injectable non-insulin antidiabetic drugs: Secondary | ICD-10-CM | POA: Diagnosis not present

## 2023-07-01 DIAGNOSIS — E559 Vitamin D deficiency, unspecified: Secondary | ICD-10-CM | POA: Diagnosis not present

## 2023-07-01 DIAGNOSIS — I10 Essential (primary) hypertension: Secondary | ICD-10-CM | POA: Diagnosis not present

## 2023-07-01 DIAGNOSIS — Z7984 Long term (current) use of oral hypoglycemic drugs: Secondary | ICD-10-CM | POA: Diagnosis not present

## 2023-07-01 DIAGNOSIS — N3020 Other chronic cystitis without hematuria: Secondary | ICD-10-CM | POA: Diagnosis not present

## 2023-07-01 DIAGNOSIS — E1169 Type 2 diabetes mellitus with other specified complication: Secondary | ICD-10-CM | POA: Diagnosis not present

## 2023-07-01 DIAGNOSIS — G4730 Sleep apnea, unspecified: Secondary | ICD-10-CM | POA: Diagnosis not present

## 2023-07-01 DIAGNOSIS — G7001 Myasthenia gravis with (acute) exacerbation: Secondary | ICD-10-CM | POA: Diagnosis not present

## 2023-07-01 DIAGNOSIS — E538 Deficiency of other specified B group vitamins: Secondary | ICD-10-CM | POA: Diagnosis not present

## 2023-07-01 DIAGNOSIS — E782 Mixed hyperlipidemia: Secondary | ICD-10-CM | POA: Diagnosis not present

## 2023-07-31 NOTE — Progress Notes (Unsigned)
 I saw Jessica Mclean in neurology clinic on 08/07/23 in follow up for AChR ab positive, generalized myasthenia gravis.  HPI: Jessica Mclean is a 78 y.o. year old female with a history of HTN, IDDM, HLD, cervical cancer s/p radiation, TIA, depression, vit D deficiency, sleep apnea who we last saw on 05/11/24.  To briefly review: Initial consultation (11/06/22): Patient woke up over a year ago and had difficulty lifting her head and had vertigo. She called her daughter who is a doctor, who insisted patient go to the hospital. She also had dysarthria. This was first thing in the morning. She had the COVID vaccine the day before, so symptoms were attributed to that. The vertigo resolved. She continues to have falls. She is not sure why. She had an MRI finally in 09/2022 (about 1 month ago) that did not show evidence of a stroke. Over the last year, she has noticed symptoms are worse with fatigue, end of day, and with use. PCP checked AChR blocking ab and was elevated to 52.    Patient has stopped taking all of her medications due to concerns of what would make myasthenia worse.   Current MG symptoms: Ptosis: Yes, especially while watching TV Double vision: Yes Speech: Yes, worse when talking more and at end of day Chewing: Yes Swallowing: Yes, choking on solids. Liquids tend to dribble out of mouth. Breathing: Denies orthopnea Arm strength: No issues Leg strength: Does not think she is weak, but does endorse falls.   She is not on any medication for myasthenia gravis.   Of note patient takes gabapentin 100 mg qhs for diabetic neuropathy. This helps.   Patient is currently going to speech therapy. She is not sure she will continue due to costly co-pay.   She does not report any constitutional symptoms like fever, night sweats, anorexia or unintentional weight loss.   EtOH use: None  Restrictive diet? Vegan, not on B12. She has been eating much less lately, mostly bread and tomatoes Family  history of neuropathy/myopathy/neurologic disease? No   11/19/22: Patient has not gotten her blood work or CT chest. Patient has not been called to get CT chest per her report, but it appears [facility] has been trying to reach patient (per notes).   She got IVIg at home from 11/14/22-11/18/22. She had significant fatigue during the treatment.   Current MG symptoms: Ptosis: About the same as prior Double vision: About the same as prior Speech: Has been clearer Chewing: Better than prior Swallowing: A little improvement Breathing: Did get woken up with shortness of breath Arm strength: Feels weak, which she did not mention at last visit. Fatigues easily. Leg strength: Fatigues easily. Fell this morning.   Current medications:  -Prednisone 20 mg daily -Mestinon 60 mg TID S/p IVIg 0.4 g/kg/day for 5 days (11/14/22-11/18/22)   Side effects: None   12/26/22: AChR abs were again positive, but lab work was otherwise unremarkable, including TPMT activity. CT chest showed no evidence of thymoma. Patient thinks the IVIg is really helping. She take take pills and can swallow much better.   She received her monthly maintenance of IVIg on 12/14/22. She is tolerating this well. Her next dose 01/19/23.   Current MG symptoms: Ptosis: Still having, not sure if this has improved Double vision: Very occasional, much better Speech: Thinks this has improved, still has some slurred Chewing: Much improved Swallowing: Much improved Breathing: Denies orthopnea Arm strength: Has to take frequent breaks to keep strength or will  get weak Leg strength: Has to take frequent breaks as above   Current medications:  -Prednisone 20 mg daily (has only been taking once daily) -Mestinon 60 mg TID   Side effects: Occasional diarrhea  05/12/23: Patient still feels very weak. She will be walking and banging into walls. She has had 1 fall since last visit. She states she is trying to eat more. She has occasional  left sided chest pain.    She feels shaky in her hands. She does not write well.    Patient had IVIg on 01/19/23, 02/18/23, 03/27/23, and 04/28/23 (not scheduled for more at this point). She has been tolerating this well and happy with her home infusion.   Patient was hospitalized from 04/08/23-04/10/23 for two weeks of unsteady gait and generalized weakness. She had an unwitnessed syncopal episode on 04/08/23. CT head showed no acute process. B12 was found to be low, which was thought to be due to poor eating habits. MG exacerbation was felt to be less likely as patient had just gotten IVIg recently and did not have fatigable weakness. Patient was given IM B12 1000 mcg in the hospital and 1000 mcg orally daily at discharge.   Since discharge, she wants to stay in the bed all the time. She does not feel safe walking.   Current MG symptoms: Ptosis: nothing significant Double vision: none Speech: Improved but still some slurring Chewing: has to cut food small, but no difficulty chewing Swallowing: None Breathing: None Arm strength: No significant weakness Leg strength: Feels weak often   Current medications:  -Prednisone 10 mg daily -Mestinon 60 mg TID -Imuran 100 mg daily   Side effects: mild diarrhea   Most recent Assessment and Plan (05/12/23): This is Jessica Mclean, a 78 y.o. female with AChR ab positive, generalized myasthenia gravis. CT chest showed no evidence of thymoma. She is s/p IVIg from 11/14/22-11/17/21 and 12/14/22, 01/19/23, 02/18/23, 03/27/23, and 04/28/23. Imuran was started 12/26/22. She was hospitalized from 04/08/23-04/10/23 for two weeks of unsteady gait and generalized weakness. She was found to have several vitamin deficiencies (vit D, B6, and B12) thought to be the cause. She did not seem to have exacerbation of MG.   Despite the multiple therapies and monthly IVIg, patient still feels very weak and is not active due to concerns of falling. This is likely contributing to  overlapping deconditioning. I would like to get her therapy and switch IVIg to Vyvgart Hytrulo for better control of her MG symptoms.   Plan: -Will try to switch patient to Vyvgart Hytrulo from IVIg - still needs home infusion -Reduce Prednisone to 7.5 mg daily. Patient will have medication mailed, so will continue 10 mg daily until the 7.5 mg daily arrives. -Continue Mestinon 60 mg TID -Continue Imuran 100 mg daily -Lab work at next visit: CBC and CMP in 3 months; will also reach B1, B6, B12, and vit D at that time -Home PT as patient does not have reliable transportation -Continue B complex and patient will make sure it has at least 1000 mcg of B12 as well.  Since their last visit: Insurance denied Vyvgart Hytrulo, but would approve Vyvgart (IV formulation), so I switched order to this. She got her first infusion on ***. Having IV access issues?***  Current MG symptoms: Ptosis: *** Double vision: *** Speech: *** Chewing: *** Swallowing: *** Breathing: *** Arm strength: *** Leg strength: ***  Current medications:  -Prednisone 7.5 mg daily -Imuran 100 mg daily -Mestinon 60 mg TID  Side effects: ***   ROS: Pertinent positive and negative systems reviewed in HPI. ***   MEDICATIONS:  Outpatient Encounter Medications as of 08/07/2023  Medication Sig   ACCU-CHEK GUIDE test strip USE TO CHECK BLOOD SUGAR 4 TIMES DAILY   Accu-Chek Softclix Lancets lancets USE TO CHECK BLOOD SUGAR 4 TIMES DAILY   albuterol (VENTOLIN HFA) 108 (90 Base) MCG/ACT inhaler Inhale 2 puffs into the lungs every 6 (six) hours as needed for wheezing or shortness of breath. (Patient not taking: Reported on 06/23/2023)   amLODipine (NORVASC) 10 MG tablet Take 1 tablet (10 mg total) by mouth daily.   aspirin-acetaminophen-caffeine (EXCEDRIN EXTRA STRENGTH) 250-250-65 MG tablet Take 2 tablets by mouth in the morning.   atorvastatin (LIPITOR) 80 MG tablet Take 1 tablet (80 mg total) by mouth daily.   azaTHIOprine  (IMURAN) 50 MG tablet Take 2 tablets (100 mg total) by mouth daily. Take 1 tablet (50 mg) for 1 week, then increase to 2 tablets (100 mg) thereafter.   blood glucose meter kit and supplies Dispense based on patient and insurance preference. Use up to four times daily as directed. (FOR ICD-10 E10.9, E11.9).   Blood Glucose Monitoring Suppl (ACCU-CHEK GUIDE) w/Device KIT    calcium-vitamin D (OSCAL-500) 500-400 MG-UNIT tablet Take 1 tablet by mouth daily.   cephALEXin (KEFLEX) 500 MG capsule Take 1 capsule (500 mg total) by mouth 3 (three) times daily.   Coenzyme Q10 (COQ-10 PO) Take by mouth.   Collagen-Vitamin C-Biotin (COLLAGEN PO) Take by mouth.   Continuous Glucose Receiver (FREESTYLE LIBRE 3 READER) DEVI Use it to check blood glucose as instructed. (Patient not taking: Reported on 06/23/2023)   Continuous Glucose Sensor (FREESTYLE LIBRE 3 SENSOR) MISC Use it to check blood glucose as instructed. (Patient not taking: Reported on 06/23/2023)   DULoxetine (CYMBALTA) 20 MG capsule Take 1 capsule (20 mg total) by mouth daily.   FLAXSEED, LINSEED, PO Take by mouth.   KRILL OIL PO Take by mouth.   meclizine (ANTIVERT) 25 MG tablet Take 1 tablet (25 mg total) by mouth 3 (three) times daily as needed for dizziness.   Melatonin-Pyridoxine (MELATIN PO) Take 10 mg by mouth.   metFORMIN (GLUCOPHAGE) 1000 MG tablet Take 1 tablet (1,000 mg total) by mouth 2 (two) times daily with a meal.   Multiple Vitamins-Minerals (PRESERVISION AREDS PO) Take 2 capsules by mouth daily.   omega-3 acid ethyl esters (LOVAZA) 1 g capsule TAKE 2 CAPSULES BY MOUTH TWICE  DAILY   phenazopyridine (PYRIDIUM) 100 MG tablet Take 1 tablet (100 mg total) by mouth 3 (three) times daily as needed for pain (Dysuria).   predniSONE (DELTASONE) 2.5 MG tablet Take 3 tablets (7.5 mg total) by mouth daily with breakfast.   pyridostigmine (MESTINON) 60 MG tablet TAKE 1 TABLET BY MOUTH 3 TIMES  DAILY   Semaglutide,0.25 or 0.5MG /DOS, (OZEMPIC, 0.25  OR 0.5 MG/DOSE,) 2 MG/3ML SOPN Inject 0.5 mg into the skin every 7 (seven) days.   telmisartan (MICARDIS) 40 MG tablet Take 1 tablet (40 mg total) by mouth daily.   terbinafine (LAMISIL) 250 MG tablet Take 1 tablet (250 mg total) by mouth daily.   vitamin B-12 (CYANOCOBALAMIN) 100 MCG tablet Take 1 tablet (100 mcg total) by mouth daily.   No facility-administered encounter medications on file as of 08/07/2023.    PAST MEDICAL HISTORY: Past Medical History:  Diagnosis Date   Bleeding disorder (HCC)    Cervical cancer (HCC) 2010   Diabetes mellitus without complication (HCC)  type 2   Diabetes mellitus without complication (HCC)    Heart murmur    High cholesterol    HLD (hyperlipidemia)    Hypertension    Mental disorder    depression; suicidal ideation in 2017   Sleep apnea    Vitamin D deficiency     PAST SURGICAL HISTORY: Past Surgical History:  Procedure Laterality Date   BILATERAL OOPHORECTOMY     CESAREAN SECTION     x 4   CHOLECYSTECTOMY     COLOSTOMY     x2   CYSTOSCOPY WITH URETHRAL DILATATION N/A 11/15/2020   Procedure: CYSTOSCOPY WITH URETHRAL DILATION;  Surgeon: Malen Gauze, MD;  Location: [facility];  Service: Urology;  Laterality: N/A;   SALPINGECTOMY     TONSILLECTOMY     TONSILLECTOMY AND ADENOIDECTOMY      ALLERGIES: Allergies  Allergen Reactions   Other     Nickel staples   Poison Ivy Treatments     Poison Ivy     FAMILY HISTORY: Family History  Problem Relation Age of Onset   Cancer Father        kidney   Cancer Mother        lung   Other Brother        heart issues    SOCIAL HISTORY: Social History   Tobacco Use   Smoking status: Never   Smokeless tobacco: Never  Vaping Use   Vaping status: Never Used  Substance Use Topics   Alcohol use: Never   Drug use: Never   Social History   Social History Narrative   ** Merged History Encounter **    Right handed   Leave alone-1 level home   Caffeine--soda/coffee       Are  you currently employed ?    What is your current occupation? retired   Do you live at home alone?yes   Who lives with you?            Objective:  Vital Signs:  LMP  (LMP Unknown)   General:*** General appearance: Awake and alert. No distress. Cooperative with exam.  Skin: No obvious rash or jaundice. HEENT: Atraumatic. Anicteric. Lungs: Non-labored breathing on room air  Heart: Regular Abdomen: Soft, non tender. Extremities: No edema. No obvious deformity.  Musculoskeletal: No obvious joint swelling.  Neurological: Mental Status: Alert. Speech fluent. No pseudobulbar affect Cranial Nerves: CNII: No RAPD. Visual fields intact. CNIII, IV, VI: PERRL. No nystagmus. EOMI. CN V: Facial sensation intact bilaterally to fine touch. [place] clench strong. Jaw jerk***. CN VII: Facial muscles symmetric and strong. No ptosis at rest or after sustained upgaze***. CN VIII: Hears finger rub well bilaterally. CN IX: No hypophonia. CN X: Palate elevates symmetrically. CN XI: Full strength shoulder shrug bilaterally. CN XII: Tongue protrusion full and midline. No atrophy or fasciculations. No significant dysarthria*** Motor: Tone is ***. *** fasciculations in *** extremities. *** atrophy. No grip or percussive myotonia.  Individual muscle group testing (MRC grade out of 5):  Movement     Neck flexion ***    Neck extension ***     Right Left   Shoulder abduction *** ***   Shoulder adduction *** ***   Shoulder ext rotation *** ***   Shoulder int rotation *** ***   Elbow flexion *** ***   Elbow extension *** ***   Wrist extension *** ***   Wrist flexion *** ***   Finger abduction - FDI *** ***   Finger abduction - ADM *** ***  Finger extension *** ***   Finger distal flexion - 2/3 *** ***   Finger distal flexion - 4/5 *** ***   Thumb flexion - FPL *** ***   Thumb abduction - APB *** ***    Hip flexion *** ***   Hip extension *** ***   Hip adduction *** ***   Hip  abduction *** ***   Knee extension *** ***   Knee flexion *** ***   Dorsiflexion *** ***   Plantarflexion *** ***   Inversion *** ***   Eversion *** ***   Great toe extension *** ***   Great toe flexion *** ***     Reflexes:  Right Left  Bicep *** ***  Tricep *** ***  BrRad *** ***  Knee *** ***  Ankle *** ***   Pathological Reflexes: Babinski: *** response bilaterally*** Hoffman: *** Troemner: *** Pectoral: *** Palmomental: *** Facial: *** Midline tap: *** Sensation: Pinprick: *** Vibration: *** Temperature: *** Proprioception: *** Coordination: Intact finger-to- nose-finger and heel-to-shin bilaterally. Romberg negative.*** Gait: Able to rise from chair with arms crossed unassisted. Normal, narrow-based gait. Able to tandem walk. Able to walk on toes and heels.***   Lab and Test Review: New results: 06/23/23: HbA1c: 6.2 CMP unremarkable CBC w/ diff significant for Hb 9.2 (chronic)  Previously reviewed results: 04/17/23: B6: 2.9 (low) Folate wnl B1 wnl   04/09/23: B12: 153 CBC significant for Hb 9.5 (chronic), MCV 90.0   04/08/23: Vit D: 27.45 HbA1c: 6.7 TSH wnl CMP unremarkable   01/15/23: HbA1c: 7.3 CMP significant for glucose 179, Cr 1.26   01/19/23: CMP significant for Cr 1.01, glucose 183 CBC significant for Hb 8.4 (previously 9.4), WBC and platelets wnl   11/23/22: B1 wnl TSH wnl TPMT: normal activity B12: 357 AChR binding ab: 2.05, modulating 81   09/17/22: AChR blocking abs: elevated to 52 HbA1c: 5.7 (high of 9.2 on 01/09/21) CMP unremarkable CBC w/ diff significant for Hb 9.4 (chronic) Lipid panel: Component     Latest Ref Rng 09/17/2022  Cholesterol, Total     100 - 199 mg/dL 213   Triglycerides     0 - 149 mg/dL 086 (H)   HDL Cholesterol     >39 mg/dL 52   VLDL Cholesterol Cal     5 - 40 mg/dL 28   LDL Chol Calc (NIH)     0 - 99 mg/dL 578 (H)   Total CHOL/HDL Ratio     0.0 - 4.4 ratio 3.6     TSH (04/15/22): 1.220 Vit D  (04/15/22): 32.2   MRI brain wo contrast (10/14/22): FINDINGS: Brain: Diffusion imaging does not show any acute or subacute infarction. No abnormality affects the brainstem or cerebellum. Incidental perivascular spaces in the right mid brain. Cerebral hemispheres show mild chronic small-vessel ischemic change of the deep and subcortical white matter. No cortical or large vessel territory stroke. No mass lesion, hemorrhage, hydrocephalus or extra-axial collection.   Vascular: Major vessels at the base of the brain show flow.   Skull and upper cervical spine: Negative   Sinuses/Orbits: Clear/normal   Other: None   IMPRESSION: No acute finding. Mild chronic small-vessel ischemic change of the cerebral hemispheric white matter.   CT chest w contrast (12/03/22): FINDINGS: Cardiovascular: The cardiac size is normal. There is no pericardial effusion. There is three-vessel coronary artery calcification greatest in the proximal LAD.   There are mild scattered calcific plaques in the aorta. The great vessels are clear. There is no aortic aneurysm,  dissection or stenosis. The pulmonary arteries and veins are normal caliber. The pulmonary arteries are centrally clear.   Mediastinum/Nodes: No enlarged mediastinal, hilar, or axillary lymph nodes. Thyroid gland, trachea, and esophagus demonstrate no significant findings.   Lungs/Pleura: Small right posterior diaphragmatic fat herniation. There is a calcified granuloma in the right lower lobe. The lungs hyperexpanded but clear of infiltrates and nodules. There is no pleural effusion, thickening or pneumothorax.   Upper Abdomen: The liver is mildly steatotic. Gallbladder is absent with prominent common bile duct measuring 12 mm most likely due to prior cholecystectomy, with mild central intrahepatic biliary prominence.   Laboratory and clinical correlation suggested. No acute upper abdominal findings. No chest wall mass is seen.    Musculoskeletal: Osteopenia with thoracic kyphosis and multilevel degenerative disc disease with spondylosis. No acute or other significant osseous findings. The ribcage is intact.   IMPRESSION: 1. No acute chest CT or significant mediastinal findings. 2. Aortic and coronary artery atherosclerosis. 3. Hyperinflated.  No focal infiltrates. 4. Mild hepatic steatosis. 5. Prior cholecystectomy with prominent common bile duct and mild central intrahepatic biliary prominence. 6. Osteopenia and degenerative change.  ASSESSMENT: This is Jessica Mclean, a 78 y.o. female with AChR ab positive, generalized myasthenia gravis. CT chest showed no evidence of thymoma. She is s/p IVIg from 11/14/22-11/17/21 and 12/14/22, 01/19/23, 02/18/23, 03/27/23, and 04/28/23. Imuran was started 12/26/22. She was hospitalized from 04/08/23-04/10/23 for two weeks of unsteady gait and generalized weakness. She was found to have several vitamin deficiencies (vit D, B6, and B12) thought to be the cause. She did not seem to have exacerbation of MG. Patient was started on Vyvgart on *** due to concerns for poor control of MG despite prednisone, imuran, mestinon, and IVIg.***  ***  Plan: *** -Blood work: B1, B6, B12, vit D  Return to clinic in ***  Total time spent reviewing records, interview, history/exam, documentation, and coordination of care on day of encounter:  *** min  Jacquelyne Balint, MD

## 2023-08-07 ENCOUNTER — Ambulatory Visit (INDEPENDENT_AMBULATORY_CARE_PROVIDER_SITE_OTHER): Payer: 59 | Admitting: Neurology

## 2023-08-07 ENCOUNTER — Other Ambulatory Visit

## 2023-08-07 ENCOUNTER — Encounter: Payer: Self-pay | Admitting: Neurology

## 2023-08-07 ENCOUNTER — Telehealth: Payer: Self-pay | Admitting: Neurology

## 2023-08-07 VITALS — BP 143/76 | HR 70 | Ht 64.5 in | Wt 125.0 lb

## 2023-08-07 DIAGNOSIS — H532 Diplopia: Secondary | ICD-10-CM

## 2023-08-07 DIAGNOSIS — G7000 Myasthenia gravis without (acute) exacerbation: Secondary | ICD-10-CM

## 2023-08-07 DIAGNOSIS — R471 Dysarthria and anarthria: Secondary | ICD-10-CM

## 2023-08-07 DIAGNOSIS — H02403 Unspecified ptosis of bilateral eyelids: Secondary | ICD-10-CM

## 2023-08-07 DIAGNOSIS — R269 Unspecified abnormalities of gait and mobility: Secondary | ICD-10-CM | POA: Diagnosis not present

## 2023-08-07 DIAGNOSIS — R1310 Dysphagia, unspecified: Secondary | ICD-10-CM

## 2023-08-07 DIAGNOSIS — R42 Dizziness and giddiness: Secondary | ICD-10-CM | POA: Diagnosis not present

## 2023-08-07 DIAGNOSIS — M6281 Muscle weakness (generalized): Secondary | ICD-10-CM | POA: Diagnosis not present

## 2023-08-07 DIAGNOSIS — D509 Iron deficiency anemia, unspecified: Secondary | ICD-10-CM | POA: Diagnosis not present

## 2023-08-07 DIAGNOSIS — G7001 Myasthenia gravis with (acute) exacerbation: Secondary | ICD-10-CM | POA: Diagnosis not present

## 2023-08-07 MED ORDER — PREDNISONE 2.5 MG PO TABS: 5.0000 mg | ORAL_TABLET | Freq: Every day | ORAL | 11 refills | Status: DC

## 2023-08-07 NOTE — Telephone Encounter (Signed)
 Dr. Loleta Chance is taking care of this.

## 2023-08-07 NOTE — Telephone Encounter (Signed)
 Jessica Mclean from [place] needs additional info for injection, 2nd attempt fax sent 08/06/23 Response needed by 08/07/23 to phone# [phone] [phone]

## 2023-08-07 NOTE — Patient Instructions (Signed)
-  Continue B complex -Continue Vyvgart, but will try again to get Vyvgart Hytrulo approved due to poor IV access -Continue Imuran 100 mg daily -Continue Mestinon 60 mg three times daily as needed -Reduce prednisone to 5 mg daily (2 tablets)  Go to nearest emergency room if you have severe weakness, difficulty breathing or swallowing as this could be a myasthenic crisis (flare).  I will see you back in clinic in about 3 months.  Please let me know if you have any questions or concerns in the meantime.   The physicians and staff at [facility] are committed to providing excellent care. You may receive a survey requesting feedback about your experience at our office. We strive to receive "very good" responses to the survey questions. If you feel that your experience would prevent you from giving the office a "very good " response, please contact our office to try to remedy the situation. We may be reached at [phone]. Thank you for taking the time out of your busy day to complete the survey.  Jacquelyne Balint, MD [facility]

## 2023-08-12 LAB — VITAMIN B12: Vitamin B-12: 552 pg/mL (ref 200–1100)

## 2023-08-12 LAB — VITAMIN D 25 HYDROXY (VIT D DEFICIENCY, FRACTURES): Vit D, 25-Hydroxy: 33 ng/mL (ref 30–100)

## 2023-08-12 LAB — VITAMIN B6: Vitamin B6: 5 ng/mL (ref 2.1–21.7)

## 2023-08-12 LAB — VITAMIN B1: Vitamin B1 (Thiamine): 15 nmol/L (ref 8–30)

## 2023-08-21 ENCOUNTER — Ambulatory Visit: Payer: 59 | Admitting: Neurology

## 2023-08-24 ENCOUNTER — Ambulatory Visit: Payer: 59 | Admitting: Nurse Practitioner

## 2023-08-24 DIAGNOSIS — Z7985 Long-term (current) use of injectable non-insulin antidiabetic drugs: Secondary | ICD-10-CM

## 2023-08-24 DIAGNOSIS — Z7984 Long term (current) use of oral hypoglycemic drugs: Secondary | ICD-10-CM

## 2023-08-24 DIAGNOSIS — E119 Type 2 diabetes mellitus without complications: Secondary | ICD-10-CM

## 2023-09-02 ENCOUNTER — Ambulatory Visit: Payer: 59

## 2023-09-03 ENCOUNTER — Ambulatory Visit (INDEPENDENT_AMBULATORY_CARE_PROVIDER_SITE_OTHER)

## 2023-09-03 ENCOUNTER — Other Ambulatory Visit: Payer: Self-pay | Admitting: Internal Medicine

## 2023-09-03 VITALS — Ht 64.5 in | Wt 123.0 lb

## 2023-09-03 DIAGNOSIS — Z0000 Encounter for general adult medical examination without abnormal findings: Secondary | ICD-10-CM | POA: Diagnosis not present

## 2023-09-03 DIAGNOSIS — H539 Unspecified visual disturbance: Secondary | ICD-10-CM

## 2023-09-03 DIAGNOSIS — Z9181 History of falling: Secondary | ICD-10-CM

## 2023-09-03 DIAGNOSIS — Z789 Other specified health status: Secondary | ICD-10-CM

## 2023-09-03 DIAGNOSIS — Z79899 Other long term (current) drug therapy: Secondary | ICD-10-CM

## 2023-09-03 DIAGNOSIS — H579 Unspecified disorder of eye and adnexa: Secondary | ICD-10-CM

## 2023-09-03 DIAGNOSIS — Z7189 Other specified counseling: Secondary | ICD-10-CM

## 2023-09-03 DIAGNOSIS — E119 Type 2 diabetes mellitus without complications: Secondary | ICD-10-CM

## 2023-09-03 DIAGNOSIS — Z0100 Encounter for examination of eyes and vision without abnormal findings: Secondary | ICD-10-CM

## 2023-09-03 DIAGNOSIS — Z780 Asymptomatic menopausal state: Secondary | ICD-10-CM

## 2023-09-03 DIAGNOSIS — Z638 Other specified problems related to primary support group: Secondary | ICD-10-CM

## 2023-09-03 DIAGNOSIS — R2681 Unsteadiness on feet: Secondary | ICD-10-CM

## 2023-09-03 DIAGNOSIS — Z1231 Encounter for screening mammogram for malignant neoplasm of breast: Secondary | ICD-10-CM

## 2023-09-03 DIAGNOSIS — Z7409 Other reduced mobility: Secondary | ICD-10-CM

## 2023-09-03 DIAGNOSIS — Z8541 Personal history of malignant neoplasm of cervix uteri: Secondary | ICD-10-CM

## 2023-09-03 DIAGNOSIS — Z5982 Transportation insecurity: Secondary | ICD-10-CM

## 2023-09-03 IMAGING — MG MM DIGITAL SCREENING BILAT W/ TOMO AND CAD
6 of 10 series · 6 of 30 positions shown · non-contrast
Comparison: Previous exam(s).

CLINICAL DATA: Screening.

EXAM:
DIGITAL SCREENING BILATERAL MAMMOGRAM WITH TOMOSYNTHESIS AND CAD
TECHNIQUE: Bilateral screening digital craniocaudal and mediolateral oblique
mammograms were obtained. Bilateral screening digital breast
tomosynthesis was performed. The images were evaluated with
computer-aided detection.

[R MLO synth-2D]
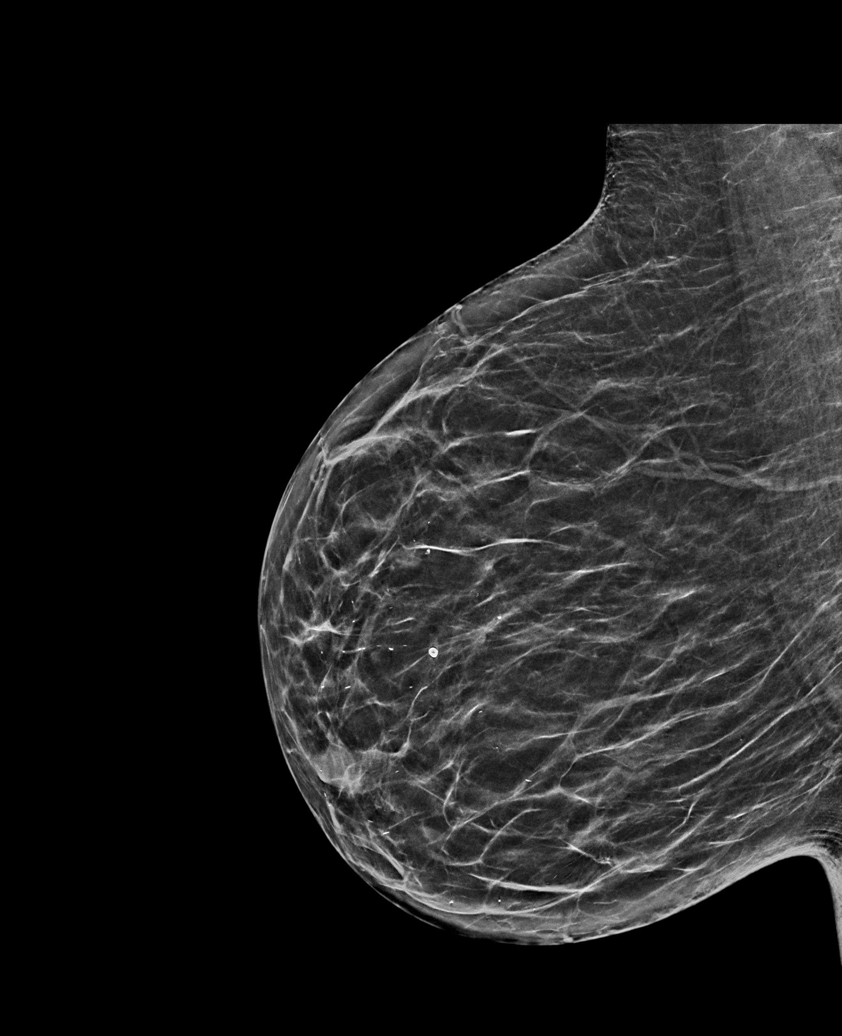

[R CC synth-2D (1 of 2)]
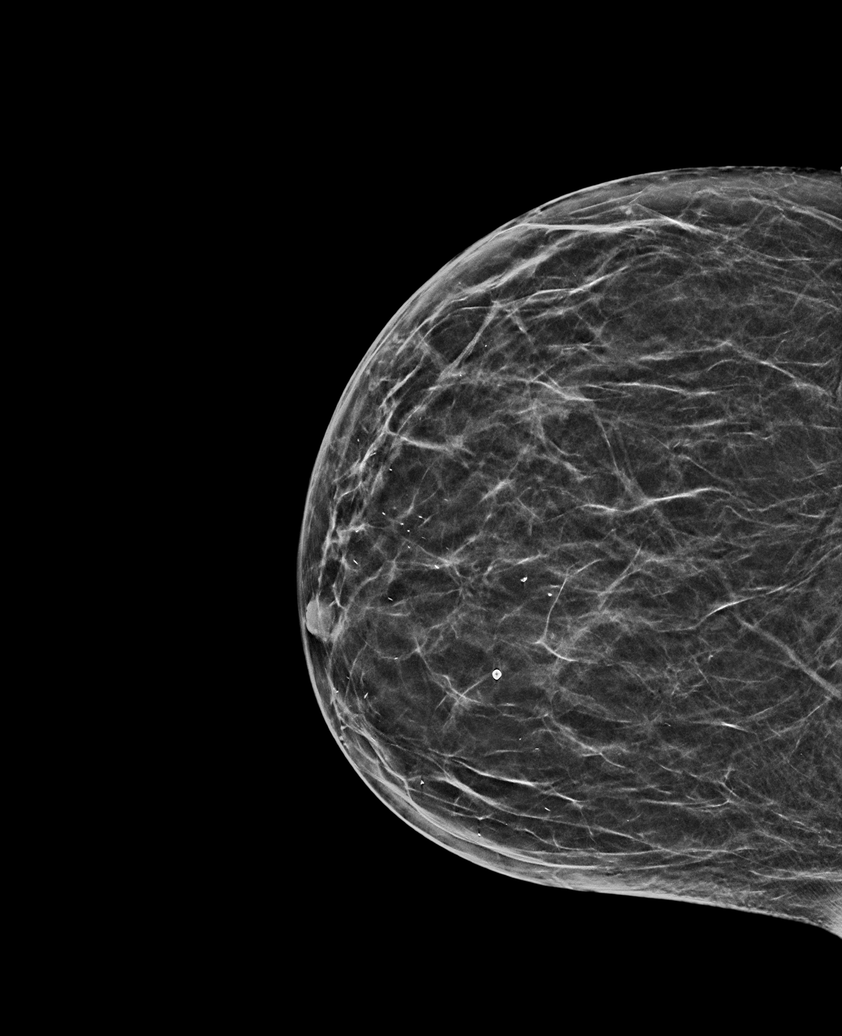

[L MLO synth-2D]
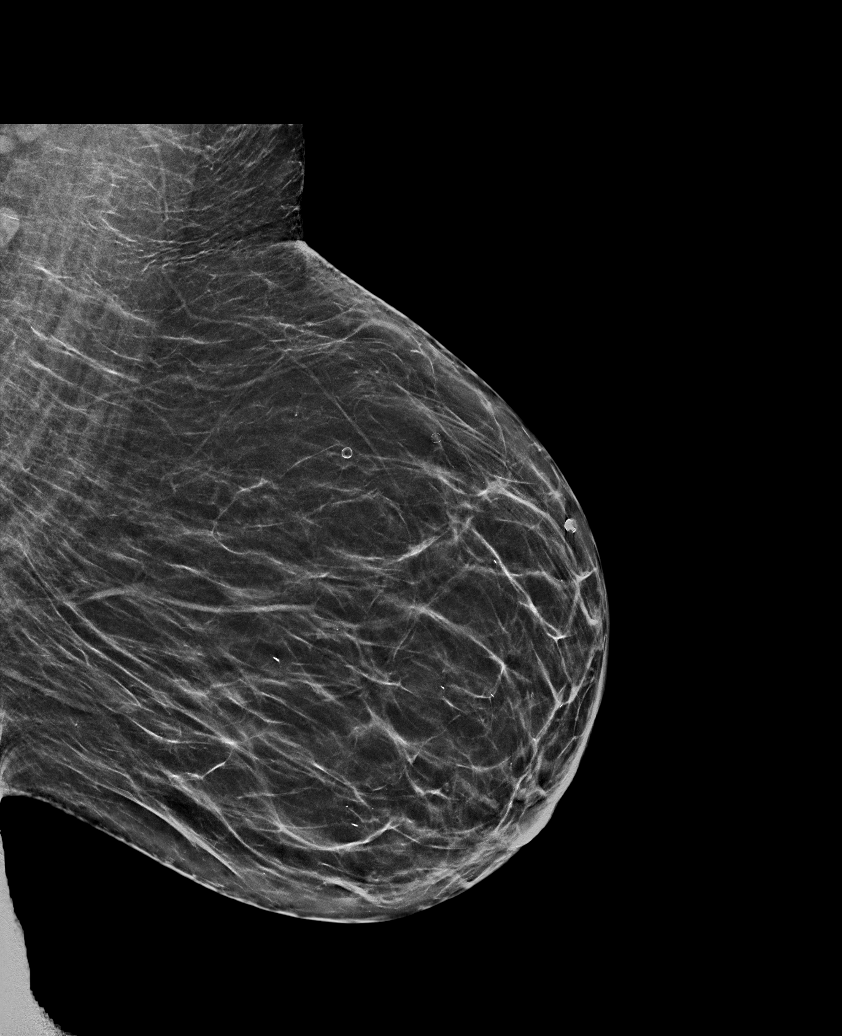

[L CC synth-2D]
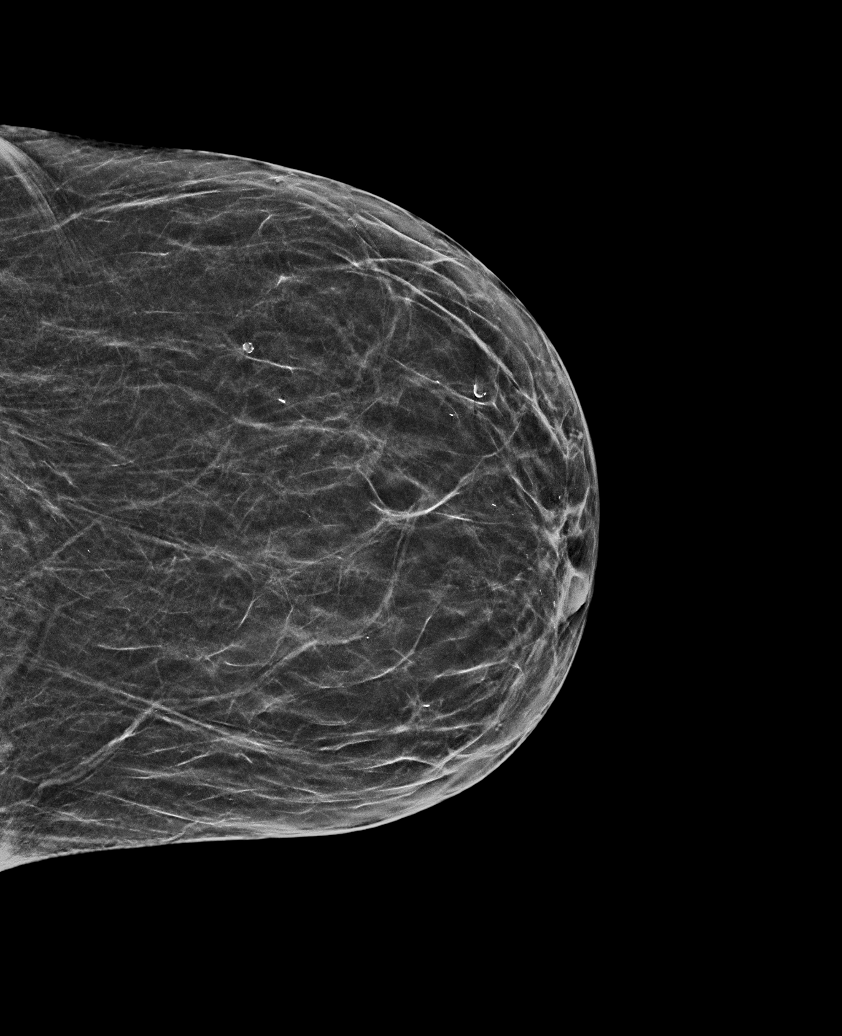

[R CC synth-2D (2 of 2)]
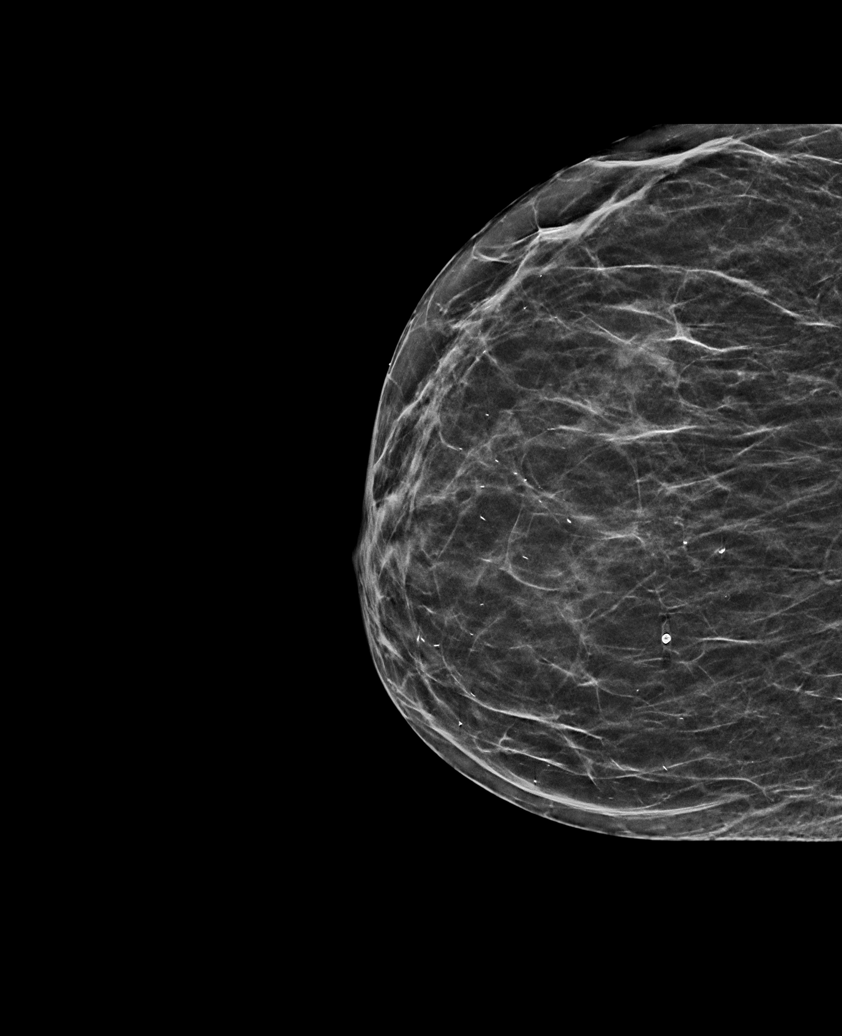

[L MLO tomo · tomo slice 31/61.0]
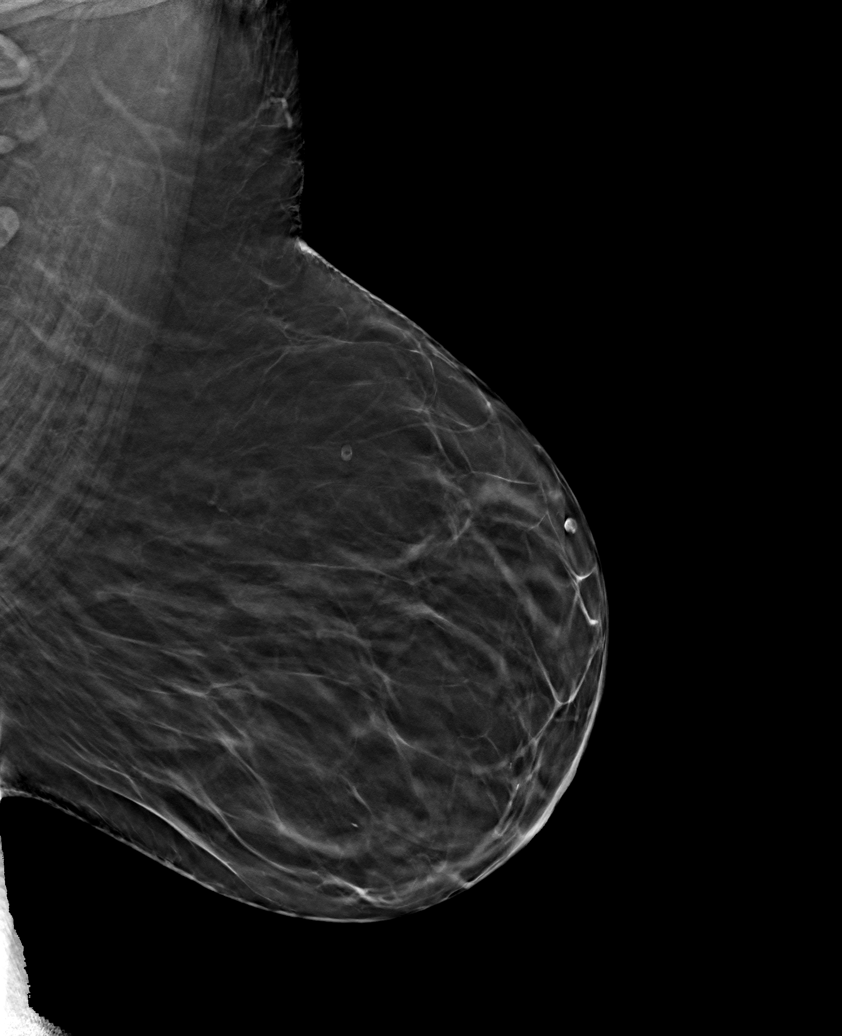

[6 of 30 positions shown; findings below may reference images not displayed]

ACR Breast Density Category b: There are scattered areas of
fibroglandular density.
FINDINGS: There are no findings suspicious for malignancy.
IMPRESSION: No mammographic evidence of malignancy. A result letter of this
screening mammogram will be mailed directly to the patient.

RECOMMENDATION:
Screening mammogram in one year. (Code:51-O-LD2)

BI-RADS CATEGORY  1: Negative.

## 2023-09-03 NOTE — Patient Instructions (Signed)
 Jessica Mclean , Thank you for taking time to come for your Medicare Wellness Visit. I appreciate your ongoing commitment to your health goals. Please review the following plan we discussed and let me know if I can assist you in the future.   Referrals/Orders/Follow-Ups/Clinician Recommendations:  Next Medicare AWV: September 05, 2024 at 12:30 pm TELEPHONE VISIT  A referral has been placed for you to check and see what additional resources are available to you.   If you haven't heard from anyone within the next 7 business days, please call them and let them know a referral has been placed  Phone: [phone] Jessica Mclean been referred to [facility]. You can call them to schedule your appointment Address: [address], [place], [place] [address] Phone: [phone]  You have an order for:  []   2D Mammogram  []   3D Mammogram  []   Bone Density     Please call for appointment:   [facility]- [facility] Mammo Bus [phone]  [facility] -[facility]  [address] [place] [address] [phone]  [facility] [address] [place], [place] [address] [phone]  [place] [address] Geanie Logan [place], [place] [address] [phone]  [facility] [address] [place], [place] [address] [phone]  [facility] [address] [place], [place] [address] [phone]  [facility] at [place] [address] [place], [place] [address] [phone]  [facility] - [place] Bone Density [address] [place], [place] [address] [phone]  [facility] [address] [place], [place] [address] [phone]  [facility] at [facility] [address] [place], [place] [address] [phone]  [facility]  Mammography - [facility] [address] [place], [place] [address] [phone]  [facility] [place] [address] [place], [place] [address] [phone]  [facility] at [facility] [address] [place], [place] [address] [phone]  [facility] [place] [address] [place], [place] [address] [phone]  [facility] [address] [place], [place] [address] [phone] [facility]  [facility] at [facility] [address] -Radiology [place], [place] [address] [phone]  [place]- [facility] [address], [place] [address] [phone]   Make sure to wear two-piece clothing.  No lotions powders or deodorants the day of the appointment Make sure to bring picture ID and insurance card.  Bring list of medications you are currently taking including any supplements.   Mammo appt: Sep 25, 2023 at 1:15 pm at [facility]  Your osteoporosis screening has been scheduled for: Sep 25, 2023 at 1:30 pm -Discontinue any medications that contain calcium at least 48 hours (2 days) prior to your scan. -Bring list of medications you are currently taking including any supplements.  -Make sure you wear comfortable 2 piece clothing     This is a list of the screening recommended for you and due dates:  Health Maintenance  Topic Date Due   Mammogram  05/24/2022   Eye exam for diabetics  08/14/2022   DEXA scan (bone density measurement)  09/07/2022   DTaP/Tdap/Td vaccine (2 - Td or Tdap) 12/28/2022   COVID-19 Vaccine (4 - 2024-25 season) 01/18/2023  Complete foot exam   09/17/2023   Flu Shot  12/18/2023   Hemoglobin A1C  12/21/2023   Yearly kidney function blood test for diabetes  06/22/2024   Yearly kidney health urinalysis for diabetes  06/22/2024   Medicare Annual Wellness Visit   09/02/2024   Pneumonia Vaccine  Completed   Hepatitis C Screening  Completed   Zoster (Shingles) Vaccine  Completed   HPV Vaccine  Aged Out   Meningitis B Vaccine  Aged Out    Advanced directives: (Declined) Advance directive discussed with you today. Even though you declined this today, please call our office should you change your mind, and we can give you the proper paperwork for you to fill out. Advance Care Planning is important because it:  [x]  Makes sure you receive the medical care that is consistent with your values, goals, and preferences  [x]  It provides guidance to your family and loved ones and it also reduces their decisional burden about whether or not they are making the right decisions based on what you want done  Follow the link provided in your after visit summary or read over the paperwork we have mailed to you to help you started getting your Advance Directives in place. If you need assistance in completing these, please reach out to [place] so that we can help you!   Next Medicare Annual Wellness Visit scheduled for next year: yes  Understanding Your Risk for Falls Millions of people have serious injuries from falls each year. It is important to understand your risk of falling. Talk with your health care provider about your risk and what you can do to lower it. If you do have a serious fall, make sure to tell your provider. Falling once raises your risk of falling again. How can falls affect me? Serious injuries from falls are common. These include: Broken bones, such as hip fractures. Head injuries, such as traumatic brain injuries (TBI) or concussions. A fear of falling can cause you to avoid activities and stay at home. This can make your muscles weaker and raise your risk for a fall. What can increase my risk? There are a number of risk factors that increase your risk for falling. The more risk factors you have, the higher your risk of falling. Serious injuries from  a fall happen most often to people who are older than 79 years old. Teenagers and young adults ages 9-29 are also at higher risk. Common risk factors include: Weakness in the lower body. Being generally weak or confused due to long-term (chronic) illness. Dizziness or balance problems. Poor vision. Medicines that cause dizziness or drowsiness. These may include: Medicines for your blood pressure, heart, anxiety, insomnia, or swelling (edema). Pain medicines. Muscle relaxants. Other risk factors include: Drinking alcohol. Having had a fall in the past. Having foot pain or wearing improper footwear. Working at a dangerous job. Having any of the following in your home: Tripping hazards, such as floor clutter or loose rugs. Poor lighting. Pets. Having dementia or memory loss. What actions can I take to lower my risk of falling?     Physical activity Stay physically fit. Do strength and balance exercises. Consider taking a regular class to build strength and balance. Yoga and tai chi are good options. Vision Have your eyes checked every year and your prescription for glasses or contacts updated as needed. Shoes and walking aids Wear non-skid shoes. Wear shoes that have rubber soles and low heels. Do not wear high heels. Do  not walk around the house in socks or slippers. Use a cane or walker as told by your provider. Home safety Attach secure railings on both sides of your stairs. Install grab bars for your bathtub, shower, and toilet. Use a non-skid mat in your bathtub or shower. Attach bath mats securely with double-sided, non-slip rug tape. Use good lighting in all rooms. Keep a flashlight near your bed. Make sure there is a clear path from your bed to the bathroom. Use night-lights. Do not use throw rugs. Make sure all carpeting is taped or tacked down securely. Remove all clutter from walkways and stairways, including extension cords. Repair uneven or broken steps and  floors. Avoid walking on icy or slippery surfaces. Walk on the grass instead of on icy or slick sidewalks. Use ice melter to get rid of ice on walkways in the winter. Use a cordless phone. Questions to ask your health care provider Can you help me check my risk for a fall? Do any of my medicines make me more likely to fall? Should I take a vitamin D supplement? What exercises can I do to improve my strength and balance? Should I make an appointment to have my vision checked? Do I need a bone density test to check for weak bones (osteoporosis)? Would it help to use a cane or a walker? Where to find more information Centers for Disease Control and Prevention, STEAD[employer]: TonerProm[employer].no Community-Based Fall Prevention Programs: TonerPromos.no [employer] on Aging: BaseRingTones.pl Contact a health care provider if: You fall at home. You are afraid of falling at home. You feel weak, drowsy, or dizzy. This information is not intended to replace advice given to you by your health care provider. Make sure you discuss any questions you have with your health care provider. Document Revised: 01/06/2022 Document Reviewed: 01/06/2022 Elsevier Patient Education  2024 [employer].

## 2023-09-03 NOTE — Progress Notes (Signed)
 Because this visit was a virtual/telehealth visit,  certain criteria was not obtained, such a blood pressure, CBG if applicable, and timed get up and go. Any medications not marked as "taking" were not mentioned during the medication reconciliation part of the visit. Any vitals not documented were not able to be obtained due to this being a telehealth visit or patient was unable to self-report a recent blood pressure reading due to a lack of equipment at home via telehealth. Vitals that have been documented are verbally provided by the patient.   Subjective:   Jessica Mclean is a 78 y.o. who presents for a Medicare Wellness preventive visit.  Visit Complete: Virtual I connected with  Jessica Mclean on 09/03/23 by a audio enabled telemedicine application and verified that I am speaking with the correct person using two identifiers.  Patient Location: Home  Provider Location: Home Office  I discussed the limitations of evaluation and management by telemedicine. The patient expressed understanding and agreed to proceed.  Vital Signs: Because this visit was a virtual/telehealth visit, some criteria may be missing or patient reported. Any vitals not documented were not able to be obtained and vitals that have been documented are patient reported.  VideoDeclined- This patient declined Librarian, academic. Therefore the visit was completed with audio only.  Persons Participating in Visit: Patient.  AWV Questionnaire: No: Patient Medicare AWV questionnaire was not completed prior to this visit.  Cardiac Risk Factors include: advanced age (>30men, >57 women);diabetes mellitus;dyslipidemia;hypertension;sedentary lifestyle     Objective:    Today's Vitals   09/03/23 1408 09/03/23 1429  Weight: 123 lb (55.8 kg)   Height: 5' 4.5" (1.638 m)   PainSc:  9    Body mass index is 20.79 kg/m.     09/03/2023    2:58 PM 08/07/2023    1:33 PM 05/12/2023   11:36 AM  04/08/2023    6:56 PM 12/26/2022   12:56 PM 11/19/2022    3:34 PM 03/23/2021    8:35 PM  Advanced Directives  Does Patient Have a Medical Advance Directive? No No No No No No No  Would patient like information on creating a medical advance directive? No - Patient declined   No - Patient declined   No - Patient declined    Current Medications (verified) Outpatient Encounter Medications as of 09/03/2023  Medication Sig   ACCU-CHEK GUIDE test strip USE TO CHECK BLOOD SUGAR 4 TIMES DAILY   Accu-Chek Softclix Lancets lancets USE TO CHECK BLOOD SUGAR 4 TIMES DAILY   amLODipine (NORVASC) 10 MG tablet Take 1 tablet (10 mg total) by mouth daily.   aspirin-acetaminophen-caffeine (EXCEDRIN EXTRA STRENGTH) 250-250-65 MG tablet Take 2 tablets by mouth in the morning.   atorvastatin (LIPITOR) 80 MG tablet Take 1 tablet (80 mg total) by mouth daily.   azaTHIOprine (IMURAN) 50 MG tablet Take 2 tablets (100 mg total) by mouth daily. Take 1 tablet (50 mg) for 1 week, then increase to 2 tablets (100 mg) thereafter.   blood glucose meter kit and supplies Dispense based on patient and insurance preference. Use up to four times daily as directed. (FOR ICD-10 E10.9, E11.9).   Blood Glucose Monitoring Suppl (ACCU-CHEK GUIDE) w/Device KIT    calcium-vitamin D (OSCAL-500) 500-400 MG-UNIT tablet Take 1 tablet by mouth daily.   Coenzyme Q10 (COQ-10 PO) Take by mouth.   Collagen-Vitamin C-Biotin (COLLAGEN PO) Take by mouth.   Continuous Glucose Receiver (FREESTYLE LIBRE 3 READER) DEVI Use it to check blood  glucose as instructed.   Continuous Glucose Sensor (FREESTYLE LIBRE 3 SENSOR) MISC Use it to check blood glucose as instructed.   DULoxetine (CYMBALTA) 20 MG capsule Take 1 capsule (20 mg total) by mouth daily.   FLAXSEED, LINSEED, PO Take by mouth.   KRILL OIL PO Take by mouth.   meclizine (ANTIVERT) 25 MG tablet Take 1 tablet (25 mg total) by mouth 3 (three) times daily as needed for dizziness.   Melatonin-Pyridoxine  (MELATIN PO) Take 10 mg by mouth.   metFORMIN (GLUCOPHAGE) 1000 MG tablet Take 1 tablet (1,000 mg total) by mouth 2 (two) times daily with a meal.   Multiple Vitamins-Minerals (PRESERVISION AREDS PO) Take 2 capsules by mouth daily.   omega-3 acid ethyl esters (LOVAZA) 1 g capsule TAKE 2 CAPSULES BY MOUTH TWICE  DAILY   phenazopyridine (PYRIDIUM) 100 MG tablet Take 1 tablet (100 mg total) by mouth 3 (three) times daily as needed for pain (Dysuria).   pyridostigmine (MESTINON) 60 MG tablet TAKE 1 TABLET BY MOUTH 3 TIMES  DAILY   Semaglutide,0.25 or 0.5MG /DOS, (OZEMPIC, 0.25 OR 0.5 MG/DOSE,) 2 MG/3ML SOPN Inject 0.5 mg into the skin every 7 (seven) days.   telmisartan (MICARDIS) 40 MG tablet Take 1 tablet (40 mg total) by mouth daily.   terbinafine (LAMISIL) 250 MG tablet Take 1 tablet (250 mg total) by mouth daily.   vitamin B-12 (CYANOCOBALAMIN) 100 MCG tablet Take 1 tablet (100 mcg total) by mouth daily.   albuterol (VENTOLIN HFA) 108 (90 Base) MCG/ACT inhaler Inhale 2 puffs into the lungs every 6 (six) hours as needed for wheezing or shortness of breath. (Patient not taking: Reported on 06/23/2023)   cephALEXin (KEFLEX) 500 MG capsule Take 1 capsule (500 mg total) by mouth 3 (three) times daily. (Patient not taking: Reported on 09/03/2023)   predniSONE (DELTASONE) 2.5 MG tablet Take 2 tablets (5 mg total) by mouth daily with breakfast. (Patient not taking: Reported on 09/03/2023)   No facility-administered encounter medications on file as of 09/03/2023.    Allergies (verified) Other and Poison ivy treatments   History: Past Medical History:  Diagnosis Date   Bleeding disorder (HCC)    Cervical cancer (HCC) 2010   Diabetes mellitus without complication (HCC)    type 2   Diabetes mellitus without complication (HCC)    Heart murmur    High cholesterol    HLD (hyperlipidemia)    Hypertension    Mental disorder    depression; suicidal ideation in 2017   Sleep apnea    Vitamin D deficiency     Past Surgical History:  Procedure Laterality Date   BILATERAL OOPHORECTOMY     CESAREAN SECTION     x 4   CHOLECYSTECTOMY     COLOSTOMY     x2   CYSTOSCOPY WITH URETHRAL DILATATION N/A 11/15/2020   Procedure: CYSTOSCOPY WITH URETHRAL DILATION;  Surgeon: Malen Gauze, MD;  Location: [facility];  Service: Urology;  Laterality: N/A;   SALPINGECTOMY     TONSILLECTOMY     TONSILLECTOMY AND ADENOIDECTOMY     Family History  Problem Relation Age of Onset   Cancer Father        kidney   Cancer Mother        lung   Other Brother        heart issues   Social History   Socioeconomic History   Marital status: Divorced    Spouse name: Not on file   Number of children: 3  Years of education: Not on file   Highest education level: Not on file  Occupational History   Occupation: retired  Tobacco Use   Smoking status: Never   Smokeless tobacco: Never  Vaping Use   Vaping status: Never Used  Substance and Sexual Activity   Alcohol use: Never   Drug use: Never   Sexual activity: Not Currently    Birth control/protection: Post-menopausal  Other Topics Concern   Not on file  Social History Narrative   ** Merged History Encounter **    Right handed   Leave alone-1 level home   Caffeine--soda/coffee       Are you currently employed ?    What is your current occupation? retired   Do you live at home alone?yes   Who lives with you?           Social Drivers of Health   Financial Resource Strain: Medium Risk (09/03/2023)   Overall Financial Resource Strain (CARDIA)    Difficulty of Paying Living Expenses: Somewhat hard  Food Insecurity: No Food Insecurity (09/03/2023)   Hunger Vital Sign    Worried About Running Out of Food in the Last Year: Never true    Ran Out of Food in the Last Year: Never true  Transportation Needs: Unmet Transportation Needs (09/03/2023)   PRAPARE - Administrator, Civil Service (Medical): Yes    Lack of Transportation (Non-Medical):  Yes  Physical Activity: Patient Declined (09/03/2023)   Exercise Vital Sign    Days of Exercise per Week: Patient declined    Minutes of Exercise per Session: Patient declined  Stress: No Stress Concern Present (09/03/2023)   [employer] of Occupational Health - Occupational Stress Questionnaire    Feeling of Stress : Only a little  Social Connections: Socially Isolated (09/03/2023)   Social Connection and Isolation Panel [NHANES]    Frequency of Communication with Friends and Family: Twice a week    Frequency of Social Gatherings with Friends and Family: Never    Attends Religious Services: Never    Database administrator or Organizations: No    Attends Banker Meetings: Never    Marital Status: Widowed    Tobacco Counseling Counseling given: Yes    Clinical Intake:  Pre-visit preparation completed: Yes  Pain : 0-10 Pain Score: 9  Pain Type: Chronic pain Pain Location: Foot Pain Orientation: Right, Left Pain Descriptors / Indicators: Constant, Numbness, Aching Pain Onset: More than a month ago Pain Frequency: Constant     BMI - recorded: 20.79 Nutritional Risks: None Diabetes: Yes CBG done?: No (telehealth visit. unable to obtain cbg. patients continuous glucose monitor isn't working and she can't get it to work.) Did pt. bring in CBG monitor from home?: No  Lab Results  Component Value Date   HGBA1C 6.2 (H) 06/23/2023   HGBA1C 6.7 (H) 04/08/2023   HGBA1C 7.3 (H) 01/15/2023     How often do you need to have someone help you when you read instructions, pamphlets, or other written materials from your doctor or pharmacy?: 1 - Never  Interpreter Needed?: No  Information entered by :: Maryjean Ka CMA   Activities of Daily Living     09/03/2023    2:58 PM 04/09/2023   12:00 PM  In your present state of health, do you have any difficulty performing the following activities:  Hearing? 0 0  Vision? 1 1  Difficulty concentrating or making decisions?  0 0  Walking or climbing  stairs? 1   Comment uses cane/walker   Dressing or bathing? 1   Doing errands, shopping? 1 0  Comment relies on public transportation. has missed appointments due to no transportation. Referral placed today to assist patient   Preparing Food and eating ? N   Using the Toilet? N   In the past six months, have you accidently leaked urine? N   Do you have problems with loss of bowel control? N   Managing your Medications? N   Managing your Finances? N   Housekeeping or managing your Housekeeping? Y     Patient Care Team: Anabel Halon, MD as PCP - General (Internal Medicine) Pearson Grippe, MD (Internal Medicine) Gavin Pound, [facility] (Inactive) (Pharmacist) Antony Madura, MD as Consulting Physician (Neurology)  Indicate any recent Medical Services you may have received from other than [facility] providers in the past year (date may be approximate).     Assessment:   This is a routine wellness examination for [place].  Hearing/Vision screen Hearing Screening - Comments:: Patient denies any hearing difficulties.   Vision Screening - Comments:: Patient is not up to date on yearly eye exams.  Referral to ophthalmology placed for DM eye exam     Goals Addressed             This Visit's Progress    Patient Stated       I want to be able to walk better and have a steadier gait with less falls.        Depression Screen     09/03/2023    3:17 PM 06/23/2023    1:39 PM 05/21/2023    2:54 PM 04/28/2023    1:26 PM 01/15/2023    2:16 PM 09/17/2022    1:26 PM 06/11/2022    1:55 PM  PHQ 2/9 Scores  PHQ - 2 Score 6 0 0 0 2 3 6   PHQ- 9 Score 16 0 0 0 6 10 17     Fall Risk     09/03/2023    2:58 PM 08/07/2023    1:33 PM 06/23/2023    1:39 PM 05/21/2023    2:55 PM 05/12/2023   11:36 AM  Fall Risk   Falls in the past year? 1 1 0 1 1  Number falls in past yr: 1 1 0 0 1  Injury with Fall? 1 0 0 1 1  Comment hospitalized at Thanksgiving due to a fall      Risk  for fall due to : History of fall(s);Impaired balance/gait;Orthopedic patient  No Fall Risks Impaired mobility;Impaired balance/gait   Follow up Falls prevention discussed;Falls evaluation completed Falls evaluation completed Falls evaluation completed Falls evaluation completed;Education provided;Falls prevention discussed Falls evaluation completed    MEDICARE RISK AT HOME:  Medicare Risk at Home Any stairs in or around the home?: No If so, are there any without handrails?: No Home free of loose throw rugs in walkways, pet beds, electrical cords, etc?: Yes Adequate lighting in your home to reduce risk of falls?: Yes Life alert?: No Use of a cane, walker or w/c?: Yes Grab bars in the bathroom?: No Shower chair or bench in shower?: No Elevated toilet seat or a handicapped toilet?: No  TIMED UP AND GO:  Was the test performed?  No  Cognitive Function: 6CIT completed        09/03/2023    2:43 PM 07/31/2020    1:49 PM  6CIT Screen  What Year? 0 points 0 points  What month? 0 points 0 points  What time? 0 points 0 points  Count back from 20 0 points 0 points  Months in reverse 0 points 0 points  Repeat phrase 0 points 0 points  Total Score 0 points 0 points    Immunizations Immunization History  Administered Date(s) Administered   Fluad Quad(high Dose 65+) 04/05/2014, 03/06/2020, 01/15/2021, 04/15/2022   Fluad Trivalent(High Dose 65+) 01/15/2023   Influenza,inj,Quad PF,6+ Mos 04/05/2014   Influenza-Unspecified 02/19/1998, 05/05/2000, 04/05/2001, 03/09/2002, 03/06/2003, 04/02/2004, 03/31/2005, 05/04/2006, 03/15/2007, 03/02/2009, 02/18/2010, 02/13/2011   Moderna Sars-Covid-2 Vaccination 05/31/2019, 07/01/2019, 04/05/2020   PNEUMOCOCCAL CONJUGATE-20 04/15/2022   Pneumococcal Conjugate-13 03/02/2015   Pneumococcal Polysaccharide-23 03/06/2003, 09/10/2009   Tdap 12/27/2012   Zoster Recombinant(Shingrix) 03/10/2018, 06/22/2018    Screening Tests Health Maintenance  Topic  Date Due   MAMMOGRAM  05/24/2022   OPHTHALMOLOGY EXAM  08/14/2022   DEXA SCAN  09/07/2022   DTaP/Tdap/Td (2 - Td or Tdap) 12/28/2022   COVID-19 Vaccine (4 - 2024-25 season) 01/18/2023   FOOT EXAM  09/17/2023   INFLUENZA VACCINE  12/18/2023   HEMOGLOBIN A1C  12/21/2023   Diabetic kidney evaluation - eGFR measurement  06/22/2024   Diabetic kidney evaluation - Urine ACR  06/22/2024   Medicare Annual Wellness (AWV)  09/02/2024   Pneumonia Vaccine 24+ Years old  Completed   Hepatitis C Screening  Completed   Zoster Vaccines- Shingrix  Completed   HPV VACCINES  Aged Out   Meningococcal B Vaccine  Aged Out    Health Maintenance  Health Maintenance Due  Topic Date Due   MAMMOGRAM  05/24/2022   OPHTHALMOLOGY EXAM  08/14/2022   DEXA SCAN  09/07/2022   DTaP/Tdap/Td (2 - Td or Tdap) 12/28/2022   COVID-19 Vaccine (4 - 2024-25 season) 01/18/2023   Health Maintenance Items Addressed: Mammogram scheduled, DEXA scheduled, Referral sent to Optometry/Ophthalmology, VBCI referral placed for transportation insecurities, multiple falls resulting in hospitalizations, and need for community support/resources Additional Screening:  Vision Screening: Recommended annual ophthalmology exams for early detection of glaucoma and other disorders of the eye.  Dental Screening: Recommended annual dental exams for proper oral hygiene  Community Resource Referral / Chronic Care Management: CRR required this visit?  Yes   CCM required this visit?  No     Plan:     I have personally reviewed and noted the following in the patient's chart:   Medical and social history Use of alcohol, tobacco or illicit drugs  Current medications and supplements including opioid prescriptions. Patient is not currently taking opioid prescriptions. Functional ability and status Nutritional status Physical activity Advanced directives List of other physicians Hospitalizations, surgeries, and ER visits in previous 12  months Vitals Screenings to include cognitive, depression, and falls Referrals and appointments  In addition, I have reviewed and discussed with patient certain preventive protocols, quality metrics, and best practice recommendations. A written personalized care plan for preventive services as well as general preventive health recommendations were provided to patient.      Sencere Symonette, CMA   09/03/2023   After Visit Summary: (Mail) Due to this being a telephonic visit, the after visit summary with patients personalized plan was offered to patient via mail   Notes: Please refer to Routing Comments.

## 2023-09-04 ENCOUNTER — Telehealth: Payer: Self-pay | Admitting: *Deleted

## 2023-09-04 NOTE — Progress Notes (Unsigned)
 Complex Care Management Note Care Guide Note  09/04/2023 Name: Jessica Mclean MRN: [id number] DOB: 1945/10/15   Complex Care Management Outreach Attempts: An unsuccessful telephone outreach was attempted today to offer the patient information about available complex care management services.  Follow Up Plan:  Additional outreach attempts will be made to offer the patient complex care management information and services.   Encounter Outcome:  No Answer  Kandis Ormond, CMA [place]  [facility], [facility] Guide Direct Dial: [phone]  Fax: [phone] Website: [email or web address]

## 2023-09-07 NOTE — Progress Notes (Unsigned)
 Complex Care Management Note Care Guide Note  09/07/2023 Name: Jessica Mclean MRN: [id number] DOB: 04/25/1946   Complex Care Management Outreach Attempts: A second unsuccessful outreach was attempted today to offer the patient with information about available complex care management services.  Follow Up Plan:  Additional outreach attempts will be made to offer the patient complex care management information and services.   Encounter Outcome:  No Answer  Kandis Ormond, CMA [place]  [facility], [facility] Guide Direct Dial: [phone]  Fax: [phone] Website: [email or web address]

## 2023-09-08 NOTE — Progress Notes (Signed)
 Complex Care Management Note Care Guide Note  09/08/2023 Name: Jessica Mclean MRN: [id number] DOB: 24-Aug-1945   Complex Care Management Outreach Attempts: A third unsuccessful outreach was attempted today to offer the patient with information about available complex care management services.  Follow Up Plan:  No further outreach attempts will be made at this time. We have been unable to contact the patient to offer or enroll patient in complex care management services.  Encounter Outcome:  No Answer  Kandis Ormond, CMA [place]  [facility], [facility] Guide Direct Dial: [phone]  Fax: [phone] Website: [email or web address]

## 2023-09-24 ENCOUNTER — Telehealth: Payer: Self-pay

## 2023-09-24 ENCOUNTER — Ambulatory Visit (INDEPENDENT_AMBULATORY_CARE_PROVIDER_SITE_OTHER): Payer: 59 | Admitting: Internal Medicine

## 2023-09-24 ENCOUNTER — Encounter: Payer: Self-pay | Admitting: Internal Medicine

## 2023-09-24 VITALS — BP 138/80 | HR 98 | Ht 64.5 in | Wt 128.0 lb

## 2023-09-24 DIAGNOSIS — E785 Hyperlipidemia, unspecified: Secondary | ICD-10-CM

## 2023-09-24 DIAGNOSIS — I10 Essential (primary) hypertension: Secondary | ICD-10-CM | POA: Diagnosis not present

## 2023-09-24 DIAGNOSIS — E1169 Type 2 diabetes mellitus with other specified complication: Secondary | ICD-10-CM | POA: Diagnosis not present

## 2023-09-24 DIAGNOSIS — G7000 Myasthenia gravis without (acute) exacerbation: Secondary | ICD-10-CM | POA: Diagnosis not present

## 2023-09-24 DIAGNOSIS — E538 Deficiency of other specified B group vitamins: Secondary | ICD-10-CM | POA: Diagnosis not present

## 2023-09-24 DIAGNOSIS — E1149 Type 2 diabetes mellitus with other diabetic neurological complication: Secondary | ICD-10-CM | POA: Diagnosis not present

## 2023-09-24 DIAGNOSIS — F339 Major depressive disorder, recurrent, unspecified: Secondary | ICD-10-CM

## 2023-09-24 MED ORDER — CYANOCOBALAMIN 1000 MCG/ML IJ SOLN
1000.0000 ug | Freq: Once | INTRAMUSCULAR | Status: AC
  Administered 2023-09-24: 1000 ug via INTRAMUSCULAR

## 2023-09-24 MED ORDER — DULOXETINE HCL 30 MG PO CPEP: 30.0000 mg | ORAL_CAPSULE | Freq: Every day | ORAL | 1 refills | Status: DC

## 2023-09-24 NOTE — Assessment & Plan Note (Signed)
 Uncontrolled, but improving Her daughter recently moved to [place], which has caused adjustment concern versus recurrence of depression symptoms Increased dose of Cymbalta  to 30 mg QD, new prescription sent

## 2023-09-24 NOTE — Assessment & Plan Note (Addendum)
 Followed by neurology S/p IVIG treatment On Mestinon , Azathioprine  and oral prednisone  - symptoms had improved initially Her current weakness is due to nutritional deficiencies and myasthenia gravis -symptoms improved since restarting oral prednisone  B12 injection today

## 2023-09-24 NOTE — Assessment & Plan Note (Signed)
 Vit B12: 153 Vit B12 injection today Advised to take vitamin B12 1000 mcg QD

## 2023-09-24 NOTE — Assessment & Plan Note (Addendum)
 On Cymbalta  20 mg once daily, increased dose to 30 mg once daily fo GAD/MDD

## 2023-09-24 NOTE — Assessment & Plan Note (Signed)
 BP Readings from Last 1 Encounters:  09/24/23 138/80   Well-controlled with telmisartan  40 mg daily and amlodipine  10 mg daily now Counseled for compliance with the medications Advised DASH diet and moderate exercise/walking as tolerated

## 2023-09-24 NOTE — Telephone Encounter (Signed)
 Copied from CRM [id number] [id number]. Topic: Appointments - Scheduling Inquiry for Clinic >> Sep 24, 2023  3:06 PM Georgeann Kindred wrote: Reason for CRM: Patient stated she had an appt and it was mentioned by the nurse that they would like to talk about scheduling an eye exam for her since it has been a while since her last appt. She stated that as the appt progressed they all forgot and she would like to schedule an eye exam in [place] is possible. Please contact patient at [phone].

## 2023-09-24 NOTE — Patient Instructions (Signed)
 Please start taking Cymbalta  30 mg once daily.  Please continue to take medications as prescribed.  Please continue to follow low carb diet and perform moderate exercise/walking as tolerated.

## 2023-09-24 NOTE — Assessment & Plan Note (Addendum)
 Lab Results  Component Value Date   HGBA1C 6.2 (H) 06/23/2023   Well-controlled Continue decreased dose of Ozempic  0.5 mg qw for now, would be cautious with weight loss Since she is on oral prednisone  now, continue metformin  1000 mg twice daily Has CGM to monitor for hypoglycemia - Freestyle libre, but needs nursing guidance again to connect to her device, advised to bring the device to office Advised to follow diabetic diet On ARB and statin Diabetic eye exam: Advised to follow up with Ophthalmology for diabetic eye exam

## 2023-09-24 NOTE — Progress Notes (Signed)
 Established Patient Office Visit  Subjective:  Patient ID: Jessica Mclean, female    DOB: April 16, 1946  Age: 78 y.o. MRN: [id number]  CC:  Chief Complaint  Patient presents with   Medical Management of Chronic Issues    Follow up    HPI Jessica Mclean is a 78 y.o. female with past medical history of HTN, DM2, TIA, HLD and cervical ca. s/p radiation who presents for f/u of her chronic medical conditions.  Interval summary: She has started taking her medications regularly now. Eats at regular intervals. Her weight has increased to 128 lbs now, has gained about 5 lbs since the last visit. Has had near falls at home, but less frequent, but no major injury including head injury.   Type II DM: She is taking Metformin  1000 mg BID and Ozempic  0.5 mg qw now. She has [employer] as she was able to use it better in the past, but still has difficulty connecting it. She has not checked blood glucose with regular fingerstick recently.  Of note, she has been taking prednisone  5 mg daily for myasthenia gravis.   HTN: BP is well-controlled. Takes medications regularly. Patient denies headache, chest pain, or palpitations.   Myasthenia gravis: Followed by neurology.  She is on Mestinon , Azathioprine  and oral prednisone  currently. She had hospitalization in 11/24 for severe weakness, but was thought to be due to B12 deficiency rather than myasthenia gravis crisis. She still has weakness and has gait disturbance, but has improved with B12 supplement. She has noticed improvement in visual disturbance and speech difficulty with myasthenia gravis treatment. She has history of CVA in the past.  Denies any new focal neurologic deficit. She has chronic weakness of the b/l LE.  Chronic cystitis: She has been taking Pyridium  for dysuria. Denies any hematuria, flank or pelvic pain currently.   MDD: She is still feeling depressed since her daughter moved to [place] .  She has restarted taking duloxetine  20 mg QD,  which has improved her symptoms somewhat.  Her crying spells and anxiety symptoms have improved compared to previous visit.  Denies SI or HI currently.    Past Medical History:  Diagnosis Date   Bleeding disorder (HCC)    Cervical cancer (HCC) 2010   Diabetes mellitus without complication (HCC)    type 2   Diabetes mellitus without complication (HCC)    Heart murmur    High cholesterol    HLD (hyperlipidemia)    Hypertension    Mental disorder    depression; suicidal ideation in 2017   Sleep apnea    Vitamin D  deficiency     Past Surgical History:  Procedure Laterality Date   BILATERAL OOPHORECTOMY     CESAREAN SECTION     x 4   CHOLECYSTECTOMY     COLOSTOMY     x2   CYSTOSCOPY WITH URETHRAL DILATATION N/A 11/15/2020   Procedure: CYSTOSCOPY WITH URETHRAL DILATION;  Surgeon: Marco Severs, MD;  Location: [facility];  Service: Urology;  Laterality: N/A;   SALPINGECTOMY     TONSILLECTOMY     TONSILLECTOMY AND ADENOIDECTOMY      Family History  Problem Relation Age of Onset   Cancer Father        kidney   Cancer Mother        lung   Other Brother        heart issues    Social History   Socioeconomic History   Marital status: Divorced    Spouse name:  Not on file   Number of children: 3   Years of education: Not on file   Highest education level: Not on file  Occupational History   Occupation: retired  Tobacco Use   Smoking status: Never   Smokeless tobacco: Never  Vaping Use   Vaping status: Never Used  Substance and Sexual Activity   Alcohol use: Never   Drug use: Never   Sexual activity: Not Currently    Birth control/protection: Post-menopausal  Other Topics Concern   Not on file  Social History Narrative   ** Merged History Encounter **    Right handed   Leave alone-1 level home   Caffeine--soda/coffee       Are you currently employed ?    What is your current occupation? retired   Do you live at home alone?yes   Who lives with you?            Social Drivers of Health   Financial Resource Strain: Medium Risk (09/03/2023)   Overall Financial Resource Strain (CARDIA)    Difficulty of Paying Living Expenses: Somewhat hard  Food Insecurity: No Food Insecurity (09/03/2023)   Hunger Vital Sign    Worried About Running Out of Food in the Last Year: Never true    Ran Out of Food in the Last Year: Never true  Transportation Needs: Unmet Transportation Needs (09/03/2023)   PRAPARE - Administrator, Civil Service (Medical): Yes    Lack of Transportation (Non-Medical): Yes  Physical Activity: Patient Declined (09/03/2023)   Exercise Vital Sign    Days of Exercise per Week: Patient declined    Minutes of Exercise per Session: Patient declined  Stress: No Stress Concern Present (09/03/2023)   [employer] of Occupational Health - Occupational Stress Questionnaire    Feeling of Stress : Only a little  Social Connections: Socially Isolated (09/03/2023)   Social Connection and Isolation Panel [NHANES]    Frequency of Communication with Friends and Family: Twice a week    Frequency of Social Gatherings with Friends and Family: Never    Attends Religious Services: Never    Database administrator or Organizations: No    Attends Banker Meetings: Never    Marital Status: Widowed  Intimate Partner Violence: Not At Risk (09/03/2023)   Humiliation, Afraid, Rape, and Kick questionnaire    Fear of Current or Ex-Partner: No    Emotionally Abused: No    Physically Abused: No    Sexually Abused: No    Outpatient Medications Prior to Visit  Medication Sig Dispense Refill   ACCU-CHEK GUIDE test strip USE TO CHECK BLOOD SUGAR 4 TIMES DAILY 400 strip 2   Accu-Chek Softclix Lancets lancets USE TO CHECK BLOOD SUGAR 4 TIMES DAILY 400 each 2   albuterol  (VENTOLIN  HFA) 108 (90 Base) MCG/ACT inhaler Inhale 2 puffs into the lungs every 6 (six) hours as needed for wheezing or shortness of breath. 18 g 1   amLODipine  (NORVASC )  10 MG tablet Take 1 tablet (10 mg total) by mouth daily. 100 tablet 2   aspirin -acetaminophen -caffeine (EXCEDRIN EXTRA STRENGTH) 250-250-65 MG tablet Take 2 tablets by mouth in the morning.     atorvastatin  (LIPITOR) 80 MG tablet Take 1 tablet (80 mg total) by mouth daily. 100 tablet 2   azaTHIOprine  (IMURAN ) 50 MG tablet Take 2 tablets (100 mg total) by mouth daily. Take 1 tablet (50 mg) for 1 week, then increase to 2 tablets (100 mg) thereafter. 60 tablet  11   blood glucose meter kit and supplies Dispense based on patient and insurance preference. Use up to four times daily as directed. (FOR ICD-10 E10.9, E11.9). 1 each 0   Blood Glucose Monitoring Suppl (ACCU-CHEK GUIDE) w/Device KIT      calcium -vitamin D  (OSCAL-500) 500-400 MG-UNIT tablet Take 1 tablet by mouth daily.     cephALEXin  (KEFLEX ) 500 MG capsule Take 1 capsule (500 mg total) by mouth 3 (three) times daily. 15 capsule 0   Coenzyme Q10 (COQ-10 PO) Take by mouth.     Collagen-Vitamin C-Biotin (COLLAGEN PO) Take by mouth.     Continuous Glucose Receiver (FREESTYLE LIBRE 3 READER) DEVI Use it to check blood glucose as instructed. 1 each 0   Continuous Glucose Sensor (FREESTYLE LIBRE 3 SENSOR) MISC Use it to check blood glucose as instructed. 2 each 5   FLAXSEED, LINSEED, PO Take by mouth.     KRILL OIL PO Take by mouth.     meclizine  (ANTIVERT ) 25 MG tablet Take 1 tablet (25 mg total) by mouth 3 (three) times daily as needed for dizziness. 30 tablet 1   Melatonin-Pyridoxine (MELATIN PO) Take 10 mg by mouth.     metFORMIN  (GLUCOPHAGE ) 1000 MG tablet Take 1 tablet (1,000 mg total) by mouth 2 (two) times daily with a meal. 200 tablet 2   Multiple Vitamins-Minerals (PRESERVISION AREDS PO) Take 2 capsules by mouth daily.     omega-3 acid ethyl esters (LOVAZA ) 1 g capsule TAKE 2 CAPSULES BY MOUTH TWICE  DAILY 180 capsule 7   phenazopyridine  (PYRIDIUM ) 100 MG tablet Take 1 tablet (100 mg total) by mouth 3 (three) times daily as needed for pain  (Dysuria). 90 tablet 5   predniSONE  (DELTASONE ) 2.5 MG tablet Take 2 tablets (5 mg total) by mouth daily with breakfast. 60 tablet 11   pyridostigmine  (MESTINON ) 60 MG tablet TAKE 1 TABLET BY MOUTH 3 TIMES  DAILY 100 tablet 9   Semaglutide ,0.25 or 0.5MG /DOS, (OZEMPIC , 0.25 OR 0.5 MG/DOSE,) 2 MG/3ML SOPN Inject 0.5 mg into the skin every 7 (seven) days. 9 mL 3   telmisartan  (MICARDIS ) 40 MG tablet Take 1 tablet (40 mg total) by mouth daily. 100 tablet 2   vitamin B-12 (CYANOCOBALAMIN ) 100 MCG tablet Take 1 tablet (100 mcg total) by mouth daily. 90 tablet 3   DULoxetine  (CYMBALTA ) 20 MG capsule Take 1 capsule (20 mg total) by mouth daily. 90 capsule 1   terbinafine  (LAMISIL ) 250 MG tablet Take 1 tablet (250 mg total) by mouth daily. 84 tablet 0   No facility-administered medications prior to visit.    Allergies  Allergen Reactions   Other     Nickel staples   Poison Ivy Treatments     Poison Ivy     ROS Review of Systems  Constitutional:  Positive for fatigue. Negative for chills and fever.  HENT:  Positive for voice change. Negative for congestion, sinus pressure, sinus pain and sore throat.   Eyes:  Negative for pain and discharge.  Respiratory:  Negative for cough and shortness of breath.   Cardiovascular:  Negative for chest pain and palpitations.  Gastrointestinal:  Negative for blood in stool, nausea and vomiting.  Endocrine: Negative for polydipsia and polyuria.  Genitourinary:  Positive for urgency. Negative for dysuria, flank pain, frequency and hematuria.  Musculoskeletal:  Negative for neck pain and neck stiffness.  Skin:  Negative for rash.  Neurological:  Positive for speech difficulty and weakness.  Psychiatric/Behavioral:  Positive for dysphoric mood  and sleep disturbance. Negative for agitation and behavioral problems. The patient is nervous/anxious.       Objective:    Physical Exam Vitals reviewed.  Constitutional:      General: She is not in acute  distress.    Appearance: She is not diaphoretic.  HENT:     Head: Normocephalic and atraumatic.     Nose: Nose normal.     Mouth/Throat:     Mouth: Mucous membranes are moist.  Eyes:     General: No scleral icterus.    Extraocular Movements: Extraocular movements intact.  Cardiovascular:     Rate and Rhythm: Normal rate and regular rhythm.     Pulses: Normal pulses.     Heart sounds: Normal heart sounds. No murmur heard. Pulmonary:     Breath sounds: No wheezing or rales.  Musculoskeletal:     Cervical back: Neck supple. No tenderness.     Right lower leg: No edema.     Left lower leg: No edema.  Feet:     Right foot:     Toenail Condition: Right toenails are abnormally thick.     Left foot:     Toenail Condition: Left toenails are abnormally thick.  Skin:    General: Skin is warm.     Findings: No rash.  Neurological:     General: No focal deficit present.     Mental Status: She is alert and oriented to person, place, and time.     Sensory: Sensory deficit (B/l feet) present.     Motor: Weakness (B/l UE and LE - 4/5) present.     Gait: Gait abnormal.     Comments: Slurred speech  Psychiatric:        Mood and Affect: Mood normal.        Behavior: Behavior normal.     BP 138/80   Pulse 98   Ht 5' 4.5" (1.638 m)   Wt 128 lb (58.1 kg)   LMP  (LMP Unknown)   SpO2 98%   BMI 21.63 kg/m  Wt Readings from Last 3 Encounters:  09/24/23 128 lb (58.1 kg)  09/03/23 123 lb (55.8 kg)  08/07/23 125 lb (56.7 kg)    Lab Results  Component Value Date   TSH 0.484 04/08/2023   Lab Results  Component Value Date   WBC 5.5 06/23/2023   HGB 9.2 (L) 06/23/2023   HCT 29.3 (L) 06/23/2023   MCV 88 06/23/2023   PLT 309 06/23/2023   Lab Results  Component Value Date   NA 139 06/23/2023   K 4.5 06/23/2023   CO2 24 06/23/2023   GLUCOSE 115 (H) 06/23/2023   BUN 15 06/23/2023   CREATININE 0.82 06/23/2023   BILITOT <0.2 06/23/2023   ALKPHOS 63 06/23/2023   AST 13 06/23/2023    ALT 9 06/23/2023   PROT 7.1 06/23/2023   ALBUMIN 3.9 06/23/2023   CALCIUM  9.2 06/23/2023   ANIONGAP 7 04/09/2023   EGFR 73 06/23/2023   Lab Results  Component Value Date   CHOL 185 09/17/2022   Lab Results  Component Value Date   HDL 52 09/17/2022   Lab Results  Component Value Date   LDLCALC 105 (H) 09/17/2022   Lab Results  Component Value Date   TRIG 159 (H) 09/17/2022   Lab Results  Component Value Date   CHOLHDL 3.6 09/17/2022   Lab Results  Component Value Date   HGBA1C 6.2 (H) 06/23/2023      Assessment & Plan:  Problem List Items Addressed This Visit       Cardiovascular and Mediastinum   Essential hypertension   BP Readings from Last 1 Encounters:  09/24/23 138/80   Well-controlled with telmisartan  40 mg daily and amlodipine  10 mg daily now Counseled for compliance with the medications Advised DASH diet and moderate exercise/walking as tolerated        Endocrine   Type 2 diabetes mellitus with hyperlipidemia (HCC) - Primary   Lab Results  Component Value Date   HGBA1C 6.2 (H) 06/23/2023   Well-controlled Continue decreased dose of Ozempic  0.5 mg qw for now, would be cautious with weight loss Since she is on oral prednisone  now, continue metformin  1000 mg twice daily Has CGM to monitor for hypoglycemia - Freestyle libre, but needs nursing guidance again to connect to her device, advised to bring the device to office Advised to follow diabetic diet On ARB and statin Diabetic eye exam: Advised to follow up with Ophthalmology for diabetic eye exam      Relevant Orders   CMP14+EGFR   Hemoglobin A1c   Diabetic neuropathy (HCC)   On Cymbalta  20 mg once daily, increased dose to 30 mg once daily fo GAD/MDD        Nervous and Auditory   Myasthenia gravis without acute exacerbation (HCC)   Followed by neurology S/p IVIG treatment On Mestinon , Azathioprine  and oral prednisone  - symptoms had improved initially Her current weakness is due to  nutritional deficiencies and myasthenia gravis -symptoms improved since restarting oral prednisone  B12 injection today      Relevant Medications   DULoxetine  (CYMBALTA ) 30 MG capsule   Other Relevant Orders   CBC with Differential/Platelet     Other   Depression, recurrent (HCC)   Uncontrolled, but improving Her daughter recently moved to [place], which has caused adjustment concern versus recurrence of depression symptoms Increased dose of Cymbalta  to 30 mg QD, new prescription sent      Relevant Medications   DULoxetine  (CYMBALTA ) 30 MG capsule   B12 deficiency   Vit B12: 153 Vit B12 injection today Advised to take vitamin B12 1000 mcg QD       Meds ordered this encounter  Medications   DULoxetine  (CYMBALTA ) 30 MG capsule    Sig: Take 1 capsule (30 mg total) by mouth daily.    Dispense:  90 capsule    Refill:  1    Dose change - 09/24/23   cyanocobalamin  (VITAMIN B12) injection 1,000 mcg    Follow-up: Return in about 4 months (around 01/25/2024) for DM and HTN.    Meldon Sport, MD

## 2023-09-25 ENCOUNTER — Encounter (HOSPITAL_COMMUNITY): Payer: Self-pay

## 2023-09-25 ENCOUNTER — Ambulatory Visit (HOSPITAL_COMMUNITY)
Admission: RE | Admit: 2023-09-25 | Discharge: 2023-09-25 | Disposition: A | Discharge status: Home / Self Care | Source: Ambulatory Visit | Attending: Internal Medicine | Admitting: Internal Medicine

## 2023-09-25 DIAGNOSIS — Z0100 Encounter for examination of eyes and vision without abnormal findings: Secondary | ICD-10-CM

## 2023-09-25 DIAGNOSIS — Z780 Asymptomatic menopausal state: Secondary | ICD-10-CM | POA: Insufficient documentation

## 2023-09-25 DIAGNOSIS — Z1231 Encounter for screening mammogram for malignant neoplasm of breast: Secondary | ICD-10-CM | POA: Diagnosis not present

## 2023-09-25 DIAGNOSIS — M8589 Other specified disorders of bone density and structure, multiple sites: Secondary | ICD-10-CM | POA: Diagnosis not present

## 2023-09-25 LAB — CBC WITH DIFFERENTIAL/PLATELET
Basophils Absolute: 0.1 10*3/uL (ref 0.0–0.2)
Basos: 1 %
EOS (ABSOLUTE): 0 10*3/uL (ref 0.0–0.4)
Eos: 0 %
Hematocrit: 32.8 % — ABNORMAL LOW (ref 34.0–46.6)
Hemoglobin: 10.2 g/dL — ABNORMAL LOW (ref 11.1–15.9)
Immature Grans (Abs): 0 10*3/uL (ref 0.0–0.1)
Immature Granulocytes: 0 %
Lymphocytes Absolute: 0.9 10*3/uL (ref 0.7–3.1)
Lymphs: 13 %
MCH: 27.7 pg (ref 26.6–33.0)
MCHC: 31.1 g/dL — ABNORMAL LOW (ref 31.5–35.7)
MCV: 89 fL (ref 79–97)
Monocytes Absolute: 0.5 10*3/uL (ref 0.1–0.9)
Monocytes: 7 %
Neutrophils Absolute: 5.5 10*3/uL (ref 1.4–7.0)
Neutrophils: 79 %
Platelets: 321 10*3/uL (ref 150–450)
RBC: 3.68 x10E6/uL — ABNORMAL LOW (ref 3.77–5.28)
RDW: 16.3 % — ABNORMAL HIGH (ref 11.7–15.4)
WBC: 7 10*3/uL (ref 3.4–10.8)

## 2023-09-25 LAB — CMP14+EGFR
ALT: 10 IU/L (ref 0–32)
AST: 20 IU/L (ref 0–40)
Albumin: 5 g/dL — ABNORMAL HIGH (ref 3.8–4.8)
Alkaline Phosphatase: 72 IU/L (ref 44–121)
BUN/Creatinine Ratio: 15 (ref 12–28)
BUN: 16 mg/dL (ref 8–27)
Bilirubin Total: 0.5 mg/dL (ref 0.0–1.2)
CO2: 21 mmol/L (ref 20–29)
Calcium: 10.5 mg/dL — ABNORMAL HIGH (ref 8.7–10.3)
Chloride: 103 mmol/L (ref 96–106)
Creatinine, Ser: 1.05 mg/dL — ABNORMAL HIGH (ref 0.57–1.00)
Globulin, Total: 2.1 g/dL (ref 1.5–4.5)
Glucose: 115 mg/dL — ABNORMAL HIGH (ref 70–99)
Potassium: 4.5 mmol/L (ref 3.5–5.2)
Sodium: 142 mmol/L (ref 134–144)
Total Protein: 7.1 g/dL (ref 6.0–8.5)
eGFR: 54 mL/min/{1.73_m2} — ABNORMAL LOW (ref >59)

## 2023-09-25 LAB — HEMOGLOBIN A1C
Est. average glucose Bld gHb Est-mCnc: 131 mg/dL
Hgb A1c MFr Bld: 6.2 % — ABNORMAL HIGH (ref 4.8–5.6)

## 2023-09-29 ENCOUNTER — Ambulatory Visit: Payer: Self-pay

## 2023-10-09 ENCOUNTER — Ambulatory Visit: Payer: Self-pay

## 2023-10-09 NOTE — Telephone Encounter (Signed)
  Chief Complaint: Shaking Symptoms: shaking, resolving Frequency: onset today Pertinent Negatives: Patient denies chest pain, tingling, numbness, weakness Disposition: [] ED /[] Urgent Care (no appt availability in office) / [] Appointment(In office/virtual)/ []  [facility]/ [] Home Care/ [] Refused Recommended Disposition /[] [facility]/ [x]  Follow-up with PCP Additional Notes:  Starts conversation saying she is not going to hospital. She is on prednisone . Has Myasthenia Gravis, due to infusion today. Called because of intense overall internal feeling of tremors in arms, abdomen, legs, this was about 20 minutes after taking prednisone  today. This has happened in the past but not as intense. Symptoms are now resolving and mild, denies other symptoms. She took her blood pressure which was "normal". Due for  [facility] infusion today, nurse is on her way to home for infusion. Wondering if ok for infusion today. Please follow up and advise.     Copied from CRM [id number]. Topic: Clinical - Red Word Triage >> Oct 09, 2023  1:42 PM [name] wrote: Red Word that prompted transfer to Nurse Triage: Patient is calling in because she is having strong internal tremors. Patient's voice is shaky as well. She says they get worse when she moves. Reason for Disposition  Shuddering (trembling) occurs without an obvious cause  Protocols used: [place] - Tics - [facility]

## 2023-10-10 ENCOUNTER — Encounter (HOSPITAL_COMMUNITY): Payer: Self-pay

## 2023-10-10 ENCOUNTER — Emergency Department (HOSPITAL_COMMUNITY)

## 2023-10-10 ENCOUNTER — Other Ambulatory Visit: Payer: Self-pay

## 2023-10-10 ENCOUNTER — Emergency Department (HOSPITAL_COMMUNITY)
Admission: EM | Admit: 2023-10-10 | Discharge: 2023-10-10 | Disposition: A | Discharge status: Home / Self Care | Attending: Emergency Medicine | Admitting: Emergency Medicine

## 2023-10-10 DIAGNOSIS — S51812A Laceration without foreign body of left forearm, initial encounter: Secondary | ICD-10-CM | POA: Diagnosis not present

## 2023-10-10 DIAGNOSIS — S59912A Unspecified injury of left forearm, initial encounter: Secondary | ICD-10-CM | POA: Diagnosis present

## 2023-10-10 DIAGNOSIS — W19XXXA Unspecified fall, initial encounter: Secondary | ICD-10-CM | POA: Insufficient documentation

## 2023-10-10 DIAGNOSIS — Z23 Encounter for immunization: Secondary | ICD-10-CM | POA: Insufficient documentation

## 2023-10-10 DIAGNOSIS — S51819A Laceration without foreign body of unspecified forearm, initial encounter: Secondary | ICD-10-CM | POA: Diagnosis not present

## 2023-10-10 LAB — COMPREHENSIVE METABOLIC PANEL WITH GFR
ALT: 12 U/L (ref 0–44)
AST: 24 U/L (ref 15–41)
Albumin: 4.4 g/dL (ref 3.5–5.0)
Alkaline Phosphatase: 59 U/L (ref 38–126)
Anion gap: 10 (ref 5–15)
BUN: 16 mg/dL (ref 8–23)
CO2: 23 mmol/L (ref 22–32)
Calcium: 9.4 mg/dL (ref 8.9–10.3)
Chloride: 103 mmol/L (ref 98–111)
Creatinine, Ser: 0.87 mg/dL (ref 0.44–1.00)
GFR, Estimated: >60 mL/min (ref >60)
Glucose, Bld: 130 mg/dL — ABNORMAL HIGH (ref 70–99)
Potassium: 4.2 mmol/L (ref 3.5–5.1)
Sodium: 136 mmol/L (ref 135–145)
Total Bilirubin: 0.4 mg/dL (ref 0.0–1.2)
Total Protein: 6.8 g/dL (ref 6.5–8.1)

## 2023-10-10 LAB — CBC WITH DIFFERENTIAL/PLATELET
Abs Immature Granulocytes: 0.03 10*3/uL (ref 0.00–0.07)
Basophils Absolute: 0.1 10*3/uL (ref 0.0–0.1)
Basophils Relative: 1 %
Eosinophils Absolute: 0.1 10*3/uL (ref 0.0–0.5)
Eosinophils Relative: 1 %
HCT: 29.7 % — ABNORMAL LOW (ref 36.0–46.0)
Hemoglobin: 9.3 g/dL — ABNORMAL LOW (ref 12.0–15.0)
Immature Granulocytes: 0 %
Lymphocytes Relative: 28 %
Lymphs Abs: 2 10*3/uL (ref 0.7–4.0)
MCH: 28.3 pg (ref 26.0–34.0)
MCHC: 31.3 g/dL (ref 30.0–36.0)
MCV: 90.3 fL (ref 80.0–100.0)
Monocytes Absolute: 0.7 10*3/uL (ref 0.1–1.0)
Monocytes Relative: 10 %
Neutro Abs: 4.2 10*3/uL (ref 1.7–7.7)
Neutrophils Relative %: 60 %
Platelets: 286 10*3/uL (ref 150–400)
RBC: 3.29 MIL/uL — ABNORMAL LOW (ref 3.87–5.11)
RDW: 17.3 % — ABNORMAL HIGH (ref 11.5–15.5)
WBC: 7.1 10*3/uL (ref 4.0–10.5)
nRBC: 0 % (ref 0.0–0.2)

## 2023-10-10 LAB — D-DIMER, QUANTITATIVE: D-Dimer, Quant: <0.27 ug{FEU}/mL (ref 0.00–0.50)

## 2023-10-10 MED ORDER — MORPHINE SULFATE (PF) 4 MG/ML IV SOLN
4.0000 mg | Freq: Once | INTRAVENOUS | Status: AC
  Administered 2023-10-10: 4 mg via INTRAVENOUS
  Filled 2023-10-10: qty 1

## 2023-10-10 MED ORDER — TETANUS-DIPHTH-ACELL PERTUSSIS 5-2.5-18.5 LF-MCG/0.5 IM SUSY
0.5000 mL | PREFILLED_SYRINGE | Freq: Once | INTRAMUSCULAR | Status: AC
  Administered 2023-10-10: 0.5 mL via INTRAMUSCULAR
  Filled 2023-10-10: qty 0.5

## 2023-10-10 MED ORDER — HYDROCODONE-ACETAMINOPHEN 5-325 MG PO TABS
2.0000 | ORAL_TABLET | ORAL | 0 refills | Status: AC | PRN
Start: 2023-10-10 — End: ?

## 2023-10-10 MED ORDER — ONDANSETRON HCL 4 MG/2ML IJ SOLN
4.0000 mg | Freq: Once | INTRAMUSCULAR | Status: AC
  Administered 2023-10-10: 4 mg via INTRAVENOUS
  Filled 2023-10-10: qty 2

## 2023-10-10 NOTE — ED Provider Notes (Signed)
 [place] EMERGENCY DEPARTMENT AT [facility] Provider Note   CSN: [id number] Arrival date & time: 10/10/23  1932     History  Chief Complaint  Patient presents with   Laceration    Jessica Mclean is a 78 y.o. female who presents today with a laceration of the left forearm that she states occurred while she was Working outside, fell on a pile of brush and believes that there was a metal wire in the brush that cut her arm.  She states there was profuse bleeding but she bandaged and control bleeding prior to arrival to the emergency department.  There is no head impact, no loss of consciousness.  Of note this patient has a previous medical history of myasthenia gravis and states that she frequently has falls as a result.  Laceration      Home Medications Prior to Admission medications   Medication Sig Start Date End Date Taking? Authorizing Provider  HYDROcodone -acetaminophen  (NORCO/VICODIN) 5-325 MG tablet Take 2 tablets by mouth every 4 (four) hours as needed. 10/10/23  Yes Juanetta Nordmann, PA  ACCU-CHEK GUIDE test strip USE TO CHECK BLOOD SUGAR 4 TIMES DAILY 12/26/22   Dixon, Phillip E, MD  Accu-Chek Softclix Lancets lancets USE TO CHECK BLOOD SUGAR 4 TIMES DAILY 04/23/23   Meldon Sport, MD  albuterol  (VENTOLIN  HFA) 108 (90 Base) MCG/ACT inhaler Inhale 2 puffs into the lungs every 6 (six) hours as needed for wheezing or shortness of breath. 05/03/23   Meldon Sport, MD  amLODipine  (NORVASC ) 10 MG tablet Take 1 tablet (10 mg total) by mouth daily. 04/23/23   Meldon Sport, MD  aspirin -acetaminophen -caffeine (EXCEDRIN EXTRA STRENGTH) 250-250-65 MG tablet Take 2 tablets by mouth in the morning.    [provider]  atorvastatin  (LIPITOR) 80 MG tablet Take 1 tablet (80 mg total) by mouth daily. 04/23/23   Meldon Sport, MD  azaTHIOprine  (IMURAN ) 50 MG tablet Take 2 tablets (100 mg total) by mouth daily. Take 1 tablet (50 mg) for 1 week, then increase to 2  tablets (100 mg) thereafter. 04/23/23   Meldon Sport, MD  blood glucose meter kit and supplies Dispense based on patient and insurance preference. Use up to four times daily as directed. (FOR ICD-10 E10.9, E11.9). 04/15/22   Meldon Sport, MD  Blood Glucose Monitoring Suppl (ACCU-CHEK GUIDE) w/Device KIT  01/21/20   [provider]  calcium -vitamin D  (OSCAL-500) 500-400 MG-UNIT tablet Take 1 tablet by mouth daily.    [provider]  cephALEXin  (KEFLEX ) 500 MG capsule Take 1 capsule (500 mg total) by mouth 3 (three) times daily. 06/01/23   Meldon Sport, MD  Coenzyme Q10 (COQ-10 PO) Take by mouth.    [provider]  Collagen-Vitamin C-Biotin (COLLAGEN PO) Take by mouth.    [provider]  Continuous Glucose Receiver (FREESTYLE LIBRE 3 READER) DEVI Use it to check blood glucose as instructed. 05/22/23   Meldon Sport, MD  Continuous Glucose Sensor (FREESTYLE LIBRE 3 SENSOR) MISC Use it to check blood glucose as instructed. 05/22/23   Meldon Sport, MD  DULoxetine  (CYMBALTA ) 30 MG capsule Take 1 capsule (30 mg total) by mouth daily. 09/24/23   Meldon Sport, MD  FLAXSEED, LINSEED, PO Take by mouth.    [provider]  KRILL OIL PO Take by mouth.    [provider]  meclizine  (ANTIVERT ) 25 MG tablet Take 1 tablet (25 mg total) by mouth 3 (three) times  daily as needed for dizziness. 04/23/23   Meldon Sport, MD  Melatonin-Pyridoxine (MELATIN PO) Take 10 mg by mouth.    [provider]  metFORMIN  (GLUCOPHAGE ) 1000 MG tablet Take 1 tablet (1,000 mg total) by mouth 2 (two) times daily with a meal. 04/23/23   Meldon Sport, MD  Multiple Vitamins-Minerals (PRESERVISION AREDS PO) Take 2 capsules by mouth daily.    [provider]  omega-3 acid ethyl esters (LOVAZA ) 1 g capsule TAKE 2 CAPSULES BY MOUTH TWICE  DAILY 05/25/23   Meldon Sport, MD  phenazopyridine  (PYRIDIUM ) 100 MG tablet Take 1 tablet (100 mg total) by mouth 3 (three)  times daily as needed for pain (Dysuria). 04/23/23   Meldon Sport, MD  predniSONE  (DELTASONE ) 2.5 MG tablet Take 2 tablets (5 mg total) by mouth daily with breakfast. 08/07/23   Ellene Gustin, MD  pyridostigmine  (MESTINON ) 60 MG tablet TAKE 1 TABLET BY MOUTH 3 TIMES  DAILY 05/25/23   Meldon Sport, MD  Semaglutide ,0.25 or 0.5MG /DOS, (OZEMPIC , 0.25 OR 0.5 MG/DOSE,) 2 MG/3ML SOPN Inject 0.5 mg into the skin every 7 (seven) days. 06/23/23   Meldon Sport, MD  telmisartan  (MICARDIS ) 40 MG tablet Take 1 tablet (40 mg total) by mouth daily. 04/23/23   Meldon Sport, MD  vitamin B-12 (CYANOCOBALAMIN ) 100 MCG tablet Take 1 tablet (100 mcg total) by mouth daily. 05/22/23   Meldon Sport, MD      Allergies    Other and Poison ivy treatments    Review of Systems   Review of Systems  Skin:  Positive for wound.  All other systems reviewed and are negative.   Physical Exam Updated Vital Signs BP (!) 155/127 (BP Location: Right Arm)   Pulse 73   Temp (!) 97.4 F (36.3 C) (Temporal)   Resp 18   Ht 5' 4.5" (1.638 m)   Wt 54.4 kg   LMP  (LMP Unknown)   SpO2 96%   BMI 20.28 kg/m  Physical Exam Vitals and nursing note reviewed.  Constitutional:      General: She is not in acute distress.    Appearance: Normal appearance.  HENT:     Head: Normocephalic and atraumatic.     Mouth/Throat:     Mouth: Mucous membranes are moist.     Pharynx: Oropharynx is clear.  Eyes:     Extraocular Movements: Extraocular movements intact.     Conjunctiva/sclera: Conjunctivae normal.     Pupils: Pupils are equal, round, and reactive to light.  Cardiovascular:     Rate and Rhythm: Normal rate and regular rhythm.     Pulses: Normal pulses.     Heart sounds: Normal heart sounds. No murmur heard.    No friction rub. No gallop.  Pulmonary:     Effort: Pulmonary effort is normal.     Breath sounds: Normal breath sounds.  Abdominal:     General: Abdomen is flat. Bowel sounds are normal.     Palpations:  Abdomen is soft.  Musculoskeletal:        General: Normal range of motion.     Cervical back: Normal range of motion and neck supple.     Right lower leg: No edema.     Left lower leg: No edema.  Skin:    General: Skin is warm and dry.     Capillary Refill: Capillary refill takes less than 2 seconds.     Findings: Laceration present.  Comments: Approximately 6 cm semicircular laceration noted to the left forearm with hemostasis.  Also noted that there is a skin tear in the overlying skin.  Neurological:     General: No focal deficit present.     Mental Status: She is alert. Mental status is at baseline.  Psychiatric:        Mood and Affect: Mood normal.     ED Results / Procedures / Treatments   Labs (all labs ordered are listed, but only abnormal results are displayed) Labs Reviewed  COMPREHENSIVE METABOLIC PANEL WITH GFR - Abnormal; Notable for the following components:      Result Value   Glucose, Bld 130 (*)    All other components within normal limits  CBC WITH DIFFERENTIAL/PLATELET - Abnormal; Notable for the following components:   RBC 3.29 (*)    Hemoglobin 9.3 (*)    HCT 29.7 (*)    RDW 17.3 (*)    All other components within normal limits  D-DIMER, QUANTITATIVE    EKG None  Radiology DG Forearm Left Result Date: 10/10/2023 CLINICAL DATA:  Marvell Slider, laceration EXAM: LEFT FOREARM - 2 VIEW COMPARISON:  None Available. FINDINGS: Frontal and lateral views of the left forearm are obtained on 3 images. Soft tissue swelling and laceration within the radial aspect of the proximal forearm. No radiopaque foreign body. No fracture, subluxation, or dislocation. Joint spaces are well preserved. IMPRESSION: 1. Soft tissue swelling and laceration within the proximal forearm. No fracture or radiopaque foreign body. Electronically Signed   By: Bobbye Burrow M.D.   On: 10/10/2023 21:09    Procedures Procedures    Medications Ordered in ED Medications  morphine (PF) 4 MG/ML  injection 4 mg (4 mg Intravenous Given 10/10/23 2044)  ondansetron  (ZOFRAN ) injection 4 mg (4 mg Intravenous Given 10/10/23 2042)  Tdap (BOOSTRIX) injection 0.5 mL (0.5 mLs Intramuscular Given 10/10/23 2119)    ED Course/ Medical Decision Making/ A&P                                 Medical Decision Making Amount and/or Complexity of Data Reviewed Labs: ordered. Radiology: ordered.  Risk Prescription drug management.   Medical Decision Making:   Jessica Mclean is a 78 y.o. female who presented to the ED today with laceration to left forearm detailed above.     Complete initial physical exam performed, notably the patient  was alert and oriented no apparent distress.  She has a semicircular 6 cm laceration to the left forearm with hemostasis achieved prior to arrival.  Distal neurovascular function is intact.  Remainder of physical exam was unremarkable.     Reviewed and confirmed nursing documentation for past medical history, family history, social history.    Initial Assessment:   With the patient's presentation of laceration to left forearm, most likely diagnosis is simple laceration.  Initial Plan:  [place] imaging of the left forearm to assess for embedded foreign body Screening labs including CBC and Metabolic panel to evaluate for infectious or metabolic etiology of disease.  Wound closure of the laceration Objective evaluation as below reviewed   Initial Study Results:     Radiology:  All images reviewed independently. Agree with radiology report at this time.   DG Forearm Left Result Date: 10/10/2023 CLINICAL DATA:  Marvell Slider, laceration EXAM: LEFT FOREARM - 2 VIEW COMPARISON:  None Available. FINDINGS: Frontal and lateral views of the left forearm are obtained on  3 images. Soft tissue swelling and laceration within the radial aspect of the proximal forearm. No radiopaque foreign body. No fracture, subluxation, or dislocation. Joint spaces are well preserved. IMPRESSION: 1. Soft  tissue swelling and laceration within the proximal forearm. No fracture or radiopaque foreign body. Electronically Signed   By: Bobbye Burrow M.D.   On: 10/10/2023 21:09   MM 3D SCREENING MAMMOGRAM BILATERAL BREAST Result Date: 09/29/2023 CLINICAL DATA:  Screening. EXAM: DIGITAL SCREENING BILATERAL MAMMOGRAM WITH TOMOSYNTHESIS AND CAD TECHNIQUE: Bilateral screening digital craniocaudal and mediolateral oblique mammograms were obtained. Bilateral screening digital breast tomosynthesis was performed. The images were evaluated with computer-aided detection. COMPARISON:  Previous exam(s). ACR Breast Density Category b: There are scattered areas of fibroglandular density. FINDINGS: There are no findings suspicious for malignancy. IMPRESSION: No mammographic evidence of malignancy. A result letter of this screening mammogram will be mailed directly to the patient. RECOMMENDATION: Screening mammogram in one year. (Code:SM-B-01Y) BI-RADS CATEGORY  1: Negative. Electronically Signed   By: Rinda Cheers M.D.   On: 09/29/2023 16:38   DG Bone Density Result Date: 09/25/2023 EXAM: DUAL X-RAY ABSORPTIOMETRY (DXA) FOR BONE MINERAL DENSITY 09/25/2023 1:57 pm CLINICAL DATA:  78 year old Female Postmenopausal. Post menopausal estrogen deficient Patient is or has been on glucocorticoid therapy. TECHNIQUE: An axial (e.g., hips, spine) and/or appendicular (e.g., radius) exam was performed, as appropriate, using [employer] Psychologist, sport and exercise at [facility]. Images are obtained for bone mineral density measurement and are not obtained for diagnostic purposes. [id number] Exclusions: L1 COMPARISON:  09/06/2020 FINDINGS: Scan quality: Good. LUMBAR SPINE (L2-L4): BMD (in g/cm2): 1.053 T-score: -1.2 Z-score: 0.6 Rate of change from previous exam: -11.7 % LEFT FEMORAL NECK: BMD (in g/cm2): 0.821 T-score: -1.6 Z-score: 0.5 Rate of change from previous exam: -10.0 % LEFT TOTAL HIP: BMD (in g/cm2): 0.814 T-score: -1.5 Z-score: 0.4  RIGHT FEMORAL NECK: BMD (in g/cm2): 0.844 T-score: -1.4 Z-score: 0.7 RIGHT TOTAL HIP: BMD (in g/cm2): 0.849 T-score: -1.3 Z-score: 0.7 DUAL-FEMUR TOTAL MEAN: Rate of change from previous exam: -18.0 % FRAX 10-YEAR PROBABILITY OF FRACTURE: 10-year fracture risk is performed using the [employer] FRAX calculator based on patient-reported risk factors. Major osteoporotic fracture: 17.9% Hip fracture: 5.1% Other situations known to alter the reliability of the FRAX score should be considered when making treatment decisions, including chronic glucocorticoid use and past treatments. Further guidance on treatment can be found at the [facility]oporosis Foundation's website [email or web address] IMPRESSION: Osteopenia based on BMD. Fracture risk is increased. Increased risk is based on low BMD, and FRAX calculation. RECOMMENDATIONS: 1. All patients should optimize calcium  and vitamin D  intake. 2. Consider FDA-approved medical therapies in postmenopausal women and men aged 89 years and older, based on the following: - A hip or vertebral (clinical or morphometric) fracture - T-score less than or equal to -2.5 and secondary causes have been excluded. - Low bone mass (T-score between -1.0 and -2.5) and a 10-year probability of a hip fracture greater than or equal to 3% or a 10-year probability of a major osteoporosis-related fracture greater than or equal to 20% based on the [place] -adapted WHO algorithm. - Clinician judgment and/or patient preferences may indicate treatment for people with 10-year fracture probabilities above or below these levels 3. Patients with diagnosis of osteoporosis or at high risk for fracture should have regular bone mineral density tests. For patients eligible for Medicare, routine testing is allowed once every 2 years. The testing frequency can be increased to one year for patients who  have rapidly progressing disease, those who are receiving or discontinuing medical therapy to restore bone mass,  or have additional risk factors. Electronically Signed   By: Dina  Arceo M.D.   On: 09/25/2023 16:25        Reassessment and Plan:   On assessment of wound there is no area of the wound that would be able to be repaired by primary intention.  As such wound to be irrigated and dressed with sterile nonadhesive dressing, bandaged with bulky dressing and patient will be plan for discharge and follow-up with primary care provider.  This was explained to the patient and she is in agreement with this plan, will further provide patient with outpatient prescription for Norco to manage pain as needed.          Final Clinical Impression(s) / ED Diagnoses Final diagnoses:  Forearm laceration, left, initial encounter    Rx / DC Orders ED Discharge Orders          Ordered    HYDROcodone -acetaminophen  (NORCO/VICODIN) 5-325 MG tablet  Every 4 hours PRN        10/10/23 2230              Juanetta Nordmann, [place] 10/10/23 2236    Cheyenne Cotta, MD 10/11/23 1212

## 2023-10-10 NOTE — ED Triage Notes (Addendum)
 Pt had a fall at home. Pt fell on a pile of brush and had metal in the pile. Left forearm laceration  Laceration is covered during triage. Bleeding is controlled. A&ox4. Pt is unsure if she is on blood thinners. Last tetanus is unknown.

## 2023-10-10 NOTE — ED Notes (Signed)
 Portable xray at bedside.

## 2023-10-13 ENCOUNTER — Other Ambulatory Visit: Payer: Self-pay | Admitting: Internal Medicine

## 2023-10-13 ENCOUNTER — Telehealth: Payer: Self-pay

## 2023-10-13 ENCOUNTER — Telehealth: Payer: Self-pay | Admitting: Internal Medicine

## 2023-10-13 DIAGNOSIS — N3000 Acute cystitis without hematuria: Secondary | ICD-10-CM

## 2023-10-13 MED ORDER — CEPHALEXIN 500 MG PO CAPS
500.0000 mg | ORAL_CAPSULE | Freq: Three times a day (TID) | ORAL | 0 refills | Status: AC
Start: 2023-10-13 — End: ?

## 2023-10-13 NOTE — Telephone Encounter (Signed)
 Copied from CRM [id number]. Topic: Clinical - Medication Question >> Oct 13, 2023  2:41 PM Lotus Round B wrote: Reason for CRM: pt called in to see if she can get a prescription for a UTI and [facility] she said Dr.Patel knows what pharmacy to send it to . If any questions to give her a call.

## 2023-10-13 NOTE — Telephone Encounter (Signed)
 Received after call hour report by fax the patient fell 05.24.2025 and went to ER tried to call the patient but no answer to schedule an appointment.

## 2023-10-13 NOTE — Telephone Encounter (Signed)
 Pt informed of this.

## 2023-10-14 ENCOUNTER — Encounter: Payer: Self-pay | Admitting: Internal Medicine

## 2023-10-14 ENCOUNTER — Ambulatory Visit (INDEPENDENT_AMBULATORY_CARE_PROVIDER_SITE_OTHER): Admitting: Internal Medicine

## 2023-10-14 ENCOUNTER — Ambulatory Visit: Payer: Self-pay

## 2023-10-14 VITALS — BP 132/82 | HR 90 | Ht 64.5 in | Wt 130.0 lb

## 2023-10-14 DIAGNOSIS — E1149 Type 2 diabetes mellitus with other diabetic neurological complication: Secondary | ICD-10-CM

## 2023-10-14 DIAGNOSIS — G7000 Myasthenia gravis without (acute) exacerbation: Secondary | ICD-10-CM

## 2023-10-14 DIAGNOSIS — Z7984 Long term (current) use of oral hypoglycemic drugs: Secondary | ICD-10-CM

## 2023-10-14 DIAGNOSIS — S51812D Laceration without foreign body of left forearm, subsequent encounter: Secondary | ICD-10-CM | POA: Diagnosis not present

## 2023-10-14 DIAGNOSIS — S51812A Laceration without foreign body of left forearm, initial encounter: Secondary | ICD-10-CM | POA: Insufficient documentation

## 2023-10-14 DIAGNOSIS — R296 Repeated falls: Secondary | ICD-10-CM | POA: Diagnosis not present

## 2023-10-14 DIAGNOSIS — Z09 Encounter for follow-up examination after completed treatment for conditions other than malignant neoplasm: Secondary | ICD-10-CM

## 2023-10-14 NOTE — Telephone Encounter (Signed)
  Chief Complaint: left forearm laceration Symptoms: pain Frequency: injured on 10/10/2023 Pertinent Negatives: Patient denies fever/drainage Disposition: [] ED /[] Urgent Care (no appt availability in office) / [x] Appointment(In office/virtual)/ []  [place] [facility]/ [] Home Care/ [] Refused Recommended Disposition /[] [facility]/ []  Follow-up with PCP Additional Notes: in ER on 10/10/23 for laceration caused by fall, needs dressing change, unable to change dressing by herself.  Patient scheduled by [facility] staff/transferred to CAL for today Copied from CRM [id number]. Topic: Clinical - Red Word Triage >> Oct 14, 2023  2:17 PM Ethelle Herb L wrote: Red Word that prompted transfer to Nurse Triage: pt has wound from fall and states she needs it changed, pt states the wound is also hurting a lot Reason for Disposition  [1] SEVERE pain AND [2] not improved 2 hours after pain medicine  Answer Assessment - Initial Assessment Questions 1. APPEARANCE of INJURY: "What does the injury look like?"      Covered but painful 2. SIZE: "How large is the cut?"      Left arm laceration 3. BLEEDING: "Is it bleeding now?" If Yes, ask: "Is it difficult to stop?"      no 4. LOCATION: "Where is the injury located?"      Left forearm 5. ONSET: "How long ago did the injury occur?"      10/10/2023 6. MECHANISM: "Tell me how it happened."      Fall on 10/10/23 7. TETANUS: "When was the last tetanus booster?"     Received 10/10/23  Protocols used: [employer]

## 2023-10-14 NOTE — Assessment & Plan Note (Signed)
 ER chart reviewed, including imaging Had wound closure and Tdap vaccination Norco as needed for severe pain

## 2023-10-14 NOTE — Assessment & Plan Note (Signed)
 Followed by neurology S/p IVIG treatment On Mestinon , Azathioprine  and oral prednisone  - symptoms had improved initially Her current weakness is due to nutritional deficiencies and myasthenia gravis -symptoms improved since restarting oral prednisone  Gets B12 injections

## 2023-10-14 NOTE — Telephone Encounter (Signed)
 Unable to reach pt

## 2023-10-14 NOTE — Telephone Encounter (Signed)
Patient scheduled to see Dr. Patel. 

## 2023-10-14 NOTE — Progress Notes (Signed)
 Acute Office Visit  Subjective:    Patient ID: Jessica Mclean, female    DOB: 04/04/1946, 78 y.o.   MRN: [id number]  Chief Complaint  Patient presents with   Follow-up    ER f/u from 10/10/23,  reports left forearm wound from fall.     HPI Patient is in today for follow up of recent ER visit on 10/10/23 after a fall in her backyard.  She was working outside, fell on a pile of wooden brush and believes that there was a metal wire in the brush that cut her arm.  She has laceration over left forearm.  She had bandaged and controlled bleeding prior to reaching ER.  In ER, she had proper wound cleaning and dressing done.  She had Tdap vaccination during ER visit.  She was given Norco as needed for severe pain.  X-ray of forearm showed soft tissue swelling and superficial laceration without any foreign body.  Of note, she has a history of myasthenia gravis and has history of recurrent falls.  She denies any LOC or head injury during this fall.  Denies any prodromal symptoms such as lightheadedness, chest pain or dyspnea before the fall.  Past Medical History:  Diagnosis Date   Bleeding disorder (HCC)    Cervical cancer (HCC) 2010   Diabetes mellitus without complication (HCC)    type 2   Diabetes mellitus without complication (HCC)    Heart murmur    High cholesterol    HLD (hyperlipidemia)    Hypertension    Mental disorder    depression; suicidal ideation in 2017   Sleep apnea    Vitamin D  deficiency     Past Surgical History:  Procedure Laterality Date   BILATERAL OOPHORECTOMY     CESAREAN SECTION     x 4   CHOLECYSTECTOMY     COLOSTOMY     x2   CYSTOSCOPY WITH URETHRAL DILATATION N/A 11/15/2020   Procedure: CYSTOSCOPY WITH URETHRAL DILATION;  Surgeon: Marco Severs, MD;  Location: [facility];  Service: Urology;  Laterality: N/A;   SALPINGECTOMY     TONSILLECTOMY     TONSILLECTOMY AND ADENOIDECTOMY      Family History  Problem Relation Age of Onset   Cancer Father         kidney   Cancer Mother        lung   Other Brother        heart issues    Social History   Socioeconomic History   Marital status: Divorced    Spouse name: Not on file   Number of children: 3   Years of education: Not on file   Highest education level: Not on file  Occupational History   Occupation: retired  Tobacco Use   Smoking status: Never   Smokeless tobacco: Never  Vaping Use   Vaping status: Never Used  Substance and Sexual Activity   Alcohol use: Never   Drug use: Never   Sexual activity: Not Currently    Birth control/protection: Post-menopausal  Other Topics Concern   Not on file  Social History Narrative   ** Merged History Encounter **    Right handed   Leave alone-1 level home   Caffeine--soda/coffee       Are you currently employed ?    What is your current occupation? retired   Do you live at home alone?yes   Who lives with you?           Social  Drivers of Health   Financial Resource Strain: Medium Risk (09/03/2023)   Overall Financial Resource Strain (CARDIA)    Difficulty of Paying Living Expenses: Somewhat hard  Food Insecurity: No Food Insecurity (09/03/2023)   Hunger Vital Sign    Worried About Running Out of Food in the Last Year: Never true    Ran Out of Food in the Last Year: Never true  Transportation Needs: Unmet Transportation Needs (09/03/2023)   PRAPARE - Transportation    Lack of Transportation (Medical): Yes    Lack of Transportation (Non-Medical): Yes  Physical Activity: Patient Declined (09/03/2023)   Exercise Vital Sign    Days of Exercise per Week: Patient declined    Minutes of Exercise per Session: Patient declined  Stress: No Stress Concern Present (09/03/2023)   [employer] of Occupational Health - Occupational Stress Questionnaire    Feeling of Stress : Only a little  Social Connections: Socially Isolated (09/03/2023)   Social Connection and Isolation Panel [NHANES]    Frequency of Communication with Friends  and Family: Twice a week    Frequency of Social Gatherings with Friends and Family: Never    Attends Religious Services: Never    Database administrator or Organizations: No    Attends Banker Meetings: Never    Marital Status: Widowed  Intimate Partner Violence: Not At Risk (09/03/2023)   Humiliation, Afraid, Rape, and Kick questionnaire    Fear of Current or Ex-Partner: No    Emotionally Abused: No    Physically Abused: No    Sexually Abused: No    Outpatient Medications Prior to Visit  Medication Sig Dispense Refill   ACCU-CHEK GUIDE test strip USE TO CHECK BLOOD SUGAR 4 TIMES DAILY 400 strip 2   Accu-Chek Softclix Lancets lancets USE TO CHECK BLOOD SUGAR 4 TIMES DAILY 400 each 2   albuterol  (VENTOLIN  HFA) 108 (90 Base) MCG/ACT inhaler Inhale 2 puffs into the lungs every 6 (six) hours as needed for wheezing or shortness of breath. 18 g 1   amLODipine  (NORVASC ) 10 MG tablet Take 1 tablet (10 mg total) by mouth daily. 100 tablet 2   aspirin -acetaminophen -caffeine (EXCEDRIN EXTRA STRENGTH) 250-250-65 MG tablet Take 2 tablets by mouth in the morning.     atorvastatin  (LIPITOR) 80 MG tablet Take 1 tablet (80 mg total) by mouth daily. 100 tablet 2   azaTHIOprine  (IMURAN ) 50 MG tablet Take 2 tablets (100 mg total) by mouth daily. Take 1 tablet (50 mg) for 1 week, then increase to 2 tablets (100 mg) thereafter. 60 tablet 11   blood glucose meter kit and supplies Dispense based on patient and insurance preference. Use up to four times daily as directed. (FOR ICD-10 E10.9, E11.9). 1 each 0   Blood Glucose Monitoring Suppl (ACCU-CHEK GUIDE) w/Device KIT      calcium -vitamin D  (OSCAL-500) 500-400 MG-UNIT tablet Take 1 tablet by mouth daily.     cephALEXin  (KEFLEX ) 500 MG capsule Take 1 capsule (500 mg total) by mouth 3 (three) times daily. 15 capsule 0   Coenzyme Q10 (COQ-10 PO) Take by mouth.     Collagen-Vitamin C-Biotin (COLLAGEN PO) Take by mouth.     Continuous Glucose Receiver  (FREESTYLE LIBRE 3 READER) DEVI Use it to check blood glucose as instructed. 1 each 0   Continuous Glucose Sensor (FREESTYLE LIBRE 3 SENSOR) MISC Use it to check blood glucose as instructed. 2 each 5   DULoxetine  (CYMBALTA ) 30 MG capsule Take 1 capsule (30 mg total) by  mouth daily. 90 capsule 1   FLAXSEED, LINSEED, PO Take by mouth.     HYDROcodone -acetaminophen  (NORCO/VICODIN) 5-325 MG tablet Take 2 tablets by mouth every 4 (four) hours as needed. 10 tablet 0   KRILL OIL PO Take by mouth.     meclizine  (ANTIVERT ) 25 MG tablet Take 1 tablet (25 mg total) by mouth 3 (three) times daily as needed for dizziness. 30 tablet 1   Melatonin-Pyridoxine (MELATIN PO) Take 10 mg by mouth.     metFORMIN  (GLUCOPHAGE ) 1000 MG tablet Take 1 tablet (1,000 mg total) by mouth 2 (two) times daily with a meal. 200 tablet 2   Multiple Vitamins-Minerals (PRESERVISION AREDS PO) Take 2 capsules by mouth daily.     omega-3 acid ethyl esters (LOVAZA ) 1 g capsule TAKE 2 CAPSULES BY MOUTH TWICE  DAILY 180 capsule 7   phenazopyridine  (PYRIDIUM ) 100 MG tablet Take 1 tablet (100 mg total) by mouth 3 (three) times daily as needed for pain (Dysuria). 90 tablet 5   predniSONE  (DELTASONE ) 2.5 MG tablet Take 2 tablets (5 mg total) by mouth daily with breakfast. 60 tablet 11   pyridostigmine  (MESTINON ) 60 MG tablet TAKE 1 TABLET BY MOUTH 3 TIMES  DAILY 100 tablet 9   Semaglutide ,0.25 or 0.5MG /DOS, (OZEMPIC , 0.25 OR 0.5 MG/DOSE,) 2 MG/3ML SOPN Inject 0.5 mg into the skin every 7 (seven) days. 9 mL 3   telmisartan  (MICARDIS ) 40 MG tablet Take 1 tablet (40 mg total) by mouth daily. 100 tablet 2   vitamin B-12 (CYANOCOBALAMIN ) 100 MCG tablet Take 1 tablet (100 mcg total) by mouth daily. 90 tablet 3   No facility-administered medications prior to visit.    Allergies  Allergen Reactions   Other     Nickel staples   Poison Ivy Treatments     Poison Ivy     Review of Systems  Constitutional:  Positive for fatigue. Negative for  chills and fever.  HENT:  Positive for voice change. Negative for congestion, sinus pressure, sinus pain and sore throat.   Eyes:  Negative for pain and discharge.  Respiratory:  Negative for cough and shortness of breath.   Cardiovascular:  Negative for chest pain and palpitations.  Gastrointestinal:  Negative for blood in stool, nausea and vomiting.  Endocrine: Negative for polydipsia and polyuria.  Genitourinary:  Positive for urgency. Negative for dysuria, flank pain, frequency and hematuria.  Musculoskeletal:  Negative for neck pain and neck stiffness.  Skin:  Positive for wound (Over left forearm). Negative for rash.  Neurological:  Positive for speech difficulty and weakness.  Psychiatric/Behavioral:  Positive for dysphoric mood and sleep disturbance. Negative for agitation and behavioral problems. The patient is nervous/anxious.        Objective:     Physical Exam Vitals reviewed.  Constitutional:      General: She is not in acute distress.    Appearance: She is not diaphoretic.  HENT:     Head: Normocephalic and atraumatic.     Nose: Nose normal.     Mouth/Throat:     Mouth: Mucous membranes are moist.  Eyes:     General: No scleral icterus.    Extraocular Movements: Extraocular movements intact.  Cardiovascular:     Rate and Rhythm: Normal rate and regular rhythm.     Pulses: Normal pulses.     Heart sounds: Normal heart sounds. No murmur heard. Pulmonary:     Breath sounds: No wheezing or rales.  Musculoskeletal:     Cervical back: Neck  supple. No tenderness.     Right lower leg: No edema.     Left lower leg: No edema.  Feet:     Right foot:     Toenail Condition: Right toenails are abnormally thick.     Left foot:     Toenail Condition: Left toenails are abnormally thick.  Skin:    General: Skin is warm.     Findings: No rash.     Comments: Semicircular laceration over left forearm, about 6 cm in length -appears well-healing compared to the ER visit   Neurological:     General: No focal deficit present.     Mental Status: She is alert and oriented to person, place, and time.     Sensory: Sensory deficit (B/l feet) present.     Motor: Weakness (B/l UE and LE - 4/5) present.     Gait: Gait abnormal.     Comments: Slurred speech  Psychiatric:        Mood and Affect: Mood normal.        Behavior: Behavior normal.     BP 132/82 (BP Location: Left Arm)   Pulse 90   Ht 5' 4.5" (1.638 m)   Wt 130 lb (59 kg)   LMP  (LMP Unknown)   SpO2 97%   BMI 21.97 kg/m  Wt Readings from Last 3 Encounters:  10/14/23 130 lb (59 kg)  10/10/23 120 lb (54.4 kg)  09/24/23 128 lb (58.1 kg)        Assessment & Plan:   Problem List Items Addressed This Visit       Endocrine   Diabetic neuropathy (HCC)   On Cymbalta  30 mg once daily for MDD/GAD      Relevant Orders   Ambulatory referral to Home Health     Nervous and Auditory   Myasthenia gravis without acute exacerbation (HCC)   Followed by neurology S/p IVIG treatment On Mestinon , Azathioprine  and oral prednisone  - symptoms had improved initially Her current weakness is due to nutritional deficiencies and myasthenia gravis -symptoms improved since restarting oral prednisone  Gets B12 injections      Relevant Orders   Ambulatory referral to Home Health     Other   Laceration of left forearm   Wound evaluated today, appears well-healing Wound cleaned and proper dressing placed Needs wound care at home, does not have any help at home, she has chronic weakness of UE and LE due to myasthenia gravis - referred to home health for wound care Prescribed Keflex  for bacterial PPx      Relevant Orders   Ambulatory referral to Home Health   Recurrent falls   Likely multifactorial, due to dehydration, myasthenia gravis and diabetic neuropathy Was referred to home health in the past for PT, but she declined it Needs to use walking support to prevent falls      Encounter for examination  following treatment at hospital - Primary   ER chart reviewed, including imaging Had wound closure and Tdap vaccination Norco as needed for severe pain        No orders of the defined types were placed in this encounter.    Meldon Sport, MD

## 2023-10-14 NOTE — Assessment & Plan Note (Addendum)
 On Cymbalta  30 mg once daily for MDD/GAD

## 2023-10-14 NOTE — Assessment & Plan Note (Signed)
 Likely multifactorial, due to dehydration, myasthenia gravis and diabetic neuropathy Was referred to home health in the past for PT, but she declined it Needs to use walking support to prevent falls

## 2023-10-14 NOTE — Patient Instructions (Signed)
 Please start taking Keflex  as prescribed.  You are being referred to Home health for wound care.

## 2023-10-14 NOTE — Assessment & Plan Note (Addendum)
 Wound evaluated today, appears well-healing Wound cleaned and proper dressing placed Needs wound care at home, does not have any help at home, she has chronic weakness of UE and LE due to myasthenia gravis - referred to home health for wound care Prescribed Keflex  for bacterial PPx

## 2023-10-15 ENCOUNTER — Ambulatory Visit: Admitting: Obstetrics & Gynecology

## 2023-10-15 ENCOUNTER — Encounter: Payer: Self-pay | Admitting: Obstetrics & Gynecology

## 2023-10-15 VITALS — BP 166/89 | Ht 64.0 in | Wt 125.0 lb

## 2023-10-15 DIAGNOSIS — N3281 Overactive bladder: Secondary | ICD-10-CM | POA: Diagnosis not present

## 2023-10-15 DIAGNOSIS — N3941 Urge incontinence: Secondary | ICD-10-CM | POA: Diagnosis not present

## 2023-10-15 DIAGNOSIS — N763 Subacute and chronic vulvitis: Secondary | ICD-10-CM

## 2023-10-15 MED ORDER — URELLE 81 MG PO TABS: 1.0000 | ORAL_TABLET | Freq: Four times a day (QID) | ORAL | 11 refills | Status: DC

## 2023-10-15 NOTE — Progress Notes (Addendum)
 Subjective:     Jessica Mclean is a 78 y.o. female here for a routine exam.  No LMP recorded (lmp unknown). Patient is postmenopausal. [id number] Birth Control Method:  menopausal Menstrual Calendar(currently): amenorrhea  Current complaints: bladder pain, constant urgency.   Current acute medical issues:  see below   Recent Gynecologic History No LMP recorded (lmp unknown). Patient is postmenopausal. Last Pap: na,   Last mammogram: 09/2023,  normal  Past Medical History:  Diagnosis Date   Bleeding disorder (HCC)    Cervical cancer (HCC) 2010   Diabetes mellitus without complication (HCC)    type 2   Diabetes mellitus without complication (HCC)    Heart murmur    High cholesterol    HLD (hyperlipidemia)    Hypertension    Mental disorder    depression; suicidal ideation in 2017   Sleep apnea    Vitamin D  deficiency     Past Surgical History:  Procedure Laterality Date   BILATERAL OOPHORECTOMY     CESAREAN SECTION     x 4   CHOLECYSTECTOMY     COLOSTOMY     x2   CYSTOSCOPY WITH URETHRAL DILATATION N/A 11/15/2020   Procedure: CYSTOSCOPY WITH URETHRAL DILATION;  Surgeon: Marco Severs, MD;  Location: [facility];  Service: Urology;  Laterality: N/A;   SALPINGECTOMY     TONSILLECTOMY     TONSILLECTOMY AND ADENOIDECTOMY      OB History     Gravida  6   Para  0   Term  0   Preterm  0   AB  0   Living  3      SAB  0   IAB  0   Ectopic  0   Multiple      Live Births              Social History   Socioeconomic History   Marital status: Divorced    Spouse name: Not on file   Number of children: 3   Years of education: Not on file   Highest education level: Not on file  Occupational History   Occupation: retired  Tobacco Use   Smoking status: Never   Smokeless tobacco: Never  Vaping Use   Vaping status: Never Used  Substance and Sexual Activity   Alcohol use: Never   Drug use: Never   Sexual activity: Not Currently    Birth  control/protection: Post-menopausal  Other Topics Concern   Not on file  Social History Narrative   ** Merged History Encounter **    Right handed   Leave alone-1 level home   Caffeine--soda/coffee       Are you currently employed ?    What is your current occupation? retired   Do you live at home alone?yes   Who lives with you?           Social Drivers of Health   Financial Resource Strain: Medium Risk (09/03/2023)   Overall Financial Resource Strain (CARDIA)    Difficulty of Paying Living Expenses: Somewhat hard  Food Insecurity: No Food Insecurity (09/03/2023)   Hunger Vital Sign    Worried About Running Out of Food in the Last Year: Never true    Ran Out of Food in the Last Year: Never true  Transportation Needs: Unmet Transportation Needs (09/03/2023)   PRAPARE - Transportation    Lack of Transportation (Medical): Yes    Lack of Transportation (Non-Medical): Yes  Physical Activity: Patient Declined (09/03/2023)  Exercise Vital Sign    Days of Exercise per Week: Patient declined    Minutes of Exercise per Session: Patient declined  Stress: No Stress Concern Present (09/03/2023)   [employer] of Occupational Health - Occupational Stress Questionnaire    Feeling of Stress : Only a little  Social Connections: Socially Isolated (09/03/2023)   Social Connection and Isolation Panel [NHANES]    Frequency of Communication with Friends and Family: Twice a week    Frequency of Social Gatherings with Friends and Family: Never    Attends Religious Services: Never    Database administrator or Organizations: No    Attends Banker Meetings: Never    Marital Status: Widowed    Family History  Problem Relation Age of Onset   Cancer Father        kidney   Cancer Mother        lung   Other Brother        heart issues     Current Outpatient Medications:    amLODipine  (NORVASC ) 10 MG tablet, Take 1 tablet (10 mg total) by mouth daily., Disp: 100 tablet, Rfl: 2    aspirin -acetaminophen -caffeine (EXCEDRIN EXTRA STRENGTH) 250-250-65 MG tablet, Take 2 tablets by mouth in the morning., Disp: , Rfl:    atorvastatin  (LIPITOR) 80 MG tablet, Take 1 tablet (80 mg total) by mouth daily., Disp: 100 tablet, Rfl: 2   calcium -vitamin D  (OSCAL-500) 500-400 MG-UNIT tablet, Take 1 tablet by mouth daily., Disp: , Rfl:    Coenzyme Q10 (COQ-10 PO), Take by mouth., Disp: , Rfl:    Collagen-Vitamin C-Biotin (COLLAGEN PO), Take by mouth., Disp: , Rfl:    ACCU-CHEK GUIDE test strip, USE TO CHECK BLOOD SUGAR 4 TIMES DAILY (Patient not taking: Reported on 10/15/2023), Disp: 400 strip, Rfl: 2   Accu-Chek Softclix Lancets lancets, USE TO CHECK BLOOD SUGAR 4 TIMES DAILY (Patient not taking: Reported on 10/15/2023), Disp: 400 each, Rfl: 2   albuterol  (VENTOLIN  HFA) 108 (90 Base) MCG/ACT inhaler, Inhale 2 puffs into the lungs every 6 (six) hours as needed for wheezing or shortness of breath. (Patient not taking: Reported on 10/15/2023), Disp: 18 g, Rfl: 1   azaTHIOprine  (IMURAN ) 50 MG tablet, Take 2 tablets (100 mg total) by mouth daily. Take 1 tablet (50 mg) for 1 week, then increase to 2 tablets (100 mg) thereafter., Disp: 60 tablet, Rfl: 11   blood glucose meter kit and supplies, Dispense based on patient and insurance preference. Use up to four times daily as directed. (FOR ICD-10 E10.9, E11.9). (Patient not taking: Reported on 10/15/2023), Disp: 1 each, Rfl: 0   Blood Glucose Monitoring Suppl (ACCU-CHEK GUIDE) w/Device KIT, , Disp: , Rfl:    cephALEXin  (KEFLEX ) 500 MG capsule, Take 1 capsule (500 mg total) by mouth 3 (three) times daily. (Patient not taking: Reported on 10/15/2023), Disp: 15 capsule, Rfl: 0   Continuous Glucose Receiver (FREESTYLE LIBRE 3 READER) DEVI, Use it to check blood glucose as instructed. (Patient not taking: Reported on 10/15/2023), Disp: 1 each, Rfl: 0   Continuous Glucose Sensor (FREESTYLE LIBRE 3 SENSOR) MISC, Use it to check blood glucose as instructed. (Patient  not taking: Reported on 10/15/2023), Disp: 2 each, Rfl: 5   DULoxetine  (CYMBALTA ) 30 MG capsule, Take 1 capsule (30 mg total) by mouth daily., Disp: 90 capsule, Rfl: 1   FLAXSEED, LINSEED, PO, Take by mouth., Disp: , Rfl:    HYDROcodone -acetaminophen  (NORCO/VICODIN) 5-325 MG tablet, Take 2 tablets by mouth every  4 (four) hours as needed., Disp: 10 tablet, Rfl: 0   KRILL OIL PO, Take by mouth., Disp: , Rfl:    meclizine  (ANTIVERT ) 25 MG tablet, Take 1 tablet (25 mg total) by mouth 3 (three) times daily as needed for dizziness., Disp: 30 tablet, Rfl: 1   Melatonin-Pyridoxine (MELATIN PO), Take 10 mg by mouth., Disp: , Rfl:    metFORMIN  (GLUCOPHAGE ) 1000 MG tablet, Take 1 tablet (1,000 mg total) by mouth 2 (two) times daily with a meal., Disp: 200 tablet, Rfl: 2   Multiple Vitamins-Minerals (PRESERVISION AREDS PO), Take 2 capsules by mouth daily., Disp: , Rfl:    omega-3 acid ethyl esters (LOVAZA ) 1 g capsule, TAKE 2 CAPSULES BY MOUTH TWICE  DAILY, Disp: 180 capsule, Rfl: 7   phenazopyridine  (PYRIDIUM ) 100 MG tablet, Take 1 tablet (100 mg total) by mouth 3 (three) times daily as needed for pain (Dysuria)., Disp: 90 tablet, Rfl: 5   predniSONE  (DELTASONE ) 2.5 MG tablet, Take 2 tablets (5 mg total) by mouth daily with breakfast., Disp: 60 tablet, Rfl: 11   pyridostigmine  (MESTINON ) 60 MG tablet, TAKE 1 TABLET BY MOUTH 3 TIMES  DAILY, Disp: 100 tablet, Rfl: 9   Semaglutide ,0.25 or 0.5MG /DOS, (OZEMPIC , 0.25 OR 0.5 MG/DOSE,) 2 MG/3ML SOPN, Inject 0.5 mg into the skin every 7 (seven) days., Disp: 9 mL, Rfl: 3   telmisartan  (MICARDIS ) 40 MG tablet, Take 1 tablet (40 mg total) by mouth daily., Disp: 100 tablet, Rfl: 2   vitamin B-12 (CYANOCOBALAMIN ) 100 MCG tablet, Take 1 tablet (100 mcg total) by mouth daily., Disp: 90 tablet, Rfl: 3  Review of Systems  Review of Systems  Constitutional: Negative for fever, chills, weight loss, malaise/fatigue and diaphoresis.  HENT: Negative for hearing loss, ear pain,  nosebleeds, congestion, sore throat, neck pain, tinnitus and ear discharge.   Eyes: Negative for blurred vision, double vision, photophobia, pain, discharge and redness.  Respiratory: Negative for cough, hemoptysis, sputum production, shortness of breath, wheezing and stridor.   Cardiovascular: Negative for chest pain, palpitations, orthopnea, claudication, leg swelling and PND.  Gastrointestinal: negative for abdominal pain. Negative for heartburn, nausea, vomiting, diarrhea, constipation, blood in stool and melena.  Genitourinary: Negative for dysuria, urgency, frequency, hematuria and flank pain.  Musculoskeletal: Negative for myalgias, back pain, joint pain and falls.  Skin: Negative for itching and rash.  Neurological: Negative for dizziness, tingling, tremors, sensory change, speech change, focal weakness, seizures, loss of consciousness, weakness and headaches.  Endo/Heme/Allergies: Negative for environmental allergies and polydipsia. Does not bruise/bleed easily.  Psychiatric/Behavioral: Negative for depression, suicidal ideas, hallucinations, memory loss and substance abuse. The patient is not nervous/anxious and does not have insomnia.        Objective:  Blood pressure (!) 166/89, height 5\' 4"  (1.626 m), weight 125 lb (56.7 kg).   Physical Exam  Vitals reviewed. Constitutional: She is oriented to person, place, and time. She appears well-developed and well-nourished.  HENT:  Head: Normocephalic and atraumatic.        Right Ear: External ear normal.  Left Ear: External ear normal.  Nose: Nose normal.  Mouth/Throat: Oropharynx is clear and moist.  Eyes: Conjunctivae and EOM are normal. Pupils are equal, round, and reactive to light. Right eye exhibits no discharge. Left eye exhibits no discharge. No scleral icterus.  Neck: Normal range of motion. Neck supple. No tracheal deviation present. No thyromegaly present.  Cardiovascular: Normal rate, regular rhythm, normal heart sounds and  intact distal pulses.  Exam reveals no gallop and  no friction rub.   No murmur heard. Respiratory: Effort normal and breath sounds normal. No respiratory distress. She has no wheezes. She has no rales. She exhibits no tenderness.  GI: Soft. Bowel sounds are normal. She exhibits no distension and no mass. There is no tenderness. There is no rebound and no guarding.  Genitourinary:  Breasts no masses skin changes or nipple changes bilaterally      Vulva is normal without lesions Vagina is pink moist without discharge Cervix normal in appearance and pap is not done Uterus is normal size shape and contour Adnexa is negative with normal sized ovaries  No prolapse is noted Musculoskeletal: Normal range of motion. She exhibits no edema and no tenderness.  Neurological: She is alert and oriented to person, place, and time. She has normal reflexes. She displays normal reflexes. No cranial nerve deficit. She exhibits normal muscle tone. Coordination normal.  Skin: Skin is warm and dry. No rash noted. No erythema. No pallor.  Psychiatric: She has a normal mood and affect. Her behavior is normal. Judgment and thought content normal.       Medications Ordered at today's visit: Meds ordered this encounter  Medications   Urelle  (URELLE /URISED) 81 MG TABS tablet    Sig: Take 1 tablet (81 mg total) by mouth 4 (four) times daily.    Dispense:  120 tablet    Refill:  11      Other orders placed at today's visit: No orders of the defined types were placed in this encounter.    ASSESSMENT + PLAN:    ICD-10-CM   1. Urge incontinence  N39.41     2. OAB (overactive bladder)  N32.81     3. Chronic vulvitis due to urine soaked pads  N76.3        Recommend use of zinc oxide as well     Return if symptoms worsen or fail to improve.

## 2023-10-16 ENCOUNTER — Telehealth: Payer: Self-pay | Admitting: Neurology

## 2023-10-16 NOTE — Telephone Encounter (Signed)
 Pt left a VM stating that she was returning a call to Dr Genita Keys

## 2023-10-16 NOTE — Telephone Encounter (Signed)
 I got a message from patient's home infusion company, [facility], that patient had a "hospitalization for wound infection" and as a result they were holding patient's MG treatment, Vyvgart Hytrulo. I called patient because I saw in her chart that she had cut her arm and been put on Keflex  for ppx but did not see that she had an infection. As such, she can continue Vyvgart. I will inform [facility]. Patient to let us  know if anything changes about her wound or there is concern for infection.  All questions were answered.  Rommie Coats, MD [facility]

## 2023-10-22 ENCOUNTER — Telehealth: Payer: Self-pay

## 2023-10-22 ENCOUNTER — Telehealth: Payer: Self-pay | Admitting: Internal Medicine

## 2023-10-22 DIAGNOSIS — H905 Unspecified sensorineural hearing loss: Secondary | ICD-10-CM | POA: Diagnosis not present

## 2023-10-22 NOTE — Telephone Encounter (Signed)
 Copied from CRM [id number]. Topic: General - Other >> Oct 22, 2023 10:16 AM Sophia H wrote: Reason for CRM: Patient states Dr. Lydia Sams ordered a visiting nurse to come do wound dressings and no one ever showed up. Patient states she tried to change it on her own and it is now stuck, wanting to know if she can soak the wound to get it loose? States she is still having pain, mostly at night. Please reach out to patient at [phone]. Will be going to an appointment at 12:30 so can leave a voicemail if needed.

## 2023-10-22 NOTE — Telephone Encounter (Signed)
 Verbal orders provided.

## 2023-10-22 NOTE — Telephone Encounter (Signed)
 Copied from CRM [id number]. Topic: Clinical - Home Health Verbal Orders >> Oct 22, 2023 12:11 PM Virgia Griffins wrote: Caller/Agency: [facility]- [place] Number: [phone] Service Requested: Need a verbal order to delay nurse care and start wound care as soon as tomorrow

## 2023-10-23 ENCOUNTER — Ambulatory Visit: Payer: Self-pay | Admitting: Internal Medicine

## 2023-10-23 NOTE — Telephone Encounter (Signed)
 FYI Only or Action Required?: Action required by provider  Patient was last seen in primary care on 10/14/2023 by Meldon Sport, MD. Called Nurse Triage reporting Pain. Symptoms began a week ago. Interventions attempted: Prescription medications:  Jessica Mclean Symptoms are: gradually worsening.  Triage Disposition: Go to ED or PCP/Alternative with Approval  Patient/caregiver understands and will follow disposition?: No, refuses disposition. This RN attempted to contact [facility] but no answer.                Copied from CRM [id number]. Topic: Clinical - Red Word Triage >> Oct 23, 2023 12:30 PM Jessica Mclean wrote: [facility] that prompted transfer to Nurse Triage: Horrible pain, injury from a fall - still feeling her UTI.. can't sleep - up all night Reason for Disposition  [1] SEVERE pain AND [2] not improved 2 hours after pain medicine  Answer Assessment - Initial Assessment Questions Pain in left arm where she fell one week ago The lady to change her wound dressing rescheduled for Monday 10/10 constant pain Pain medicine but out of it per patient "Haven't taken my temperature, I have sweating and heat during night"  UTI- pt was prescribed cephalexin  to take tid, two are left Pain with urination, worse at night  Protocols used: Arm Pain-A-AH

## 2023-10-26 ENCOUNTER — Telehealth: Payer: Self-pay | Admitting: Internal Medicine

## 2023-10-26 NOTE — Telephone Encounter (Signed)
 Copied from CRM [id number]. Topic: General - Other >> Oct 26, 2023 12:25 PM Jenice Mitts wrote: Reason for CRM: Lucky from [facility] is calling because the patient was not available today for her visit  She is wondering if a new order can be written for tomorrow  She would like a call back at [phone] it is a confidential voicemail

## 2023-10-26 NOTE — Telephone Encounter (Signed)
 Patient will be scheduled for Thursday  6/12 . Patient is aware

## 2023-10-26 NOTE — Telephone Encounter (Signed)
 Spoke to [facility] gave a verbal order.

## 2023-10-27 DIAGNOSIS — Z8673 Personal history of transient ischemic attack (TIA), and cerebral infarction without residual deficits: Secondary | ICD-10-CM | POA: Diagnosis not present

## 2023-10-27 DIAGNOSIS — E782 Mixed hyperlipidemia: Secondary | ICD-10-CM | POA: Diagnosis not present

## 2023-10-27 DIAGNOSIS — R296 Repeated falls: Secondary | ICD-10-CM | POA: Diagnosis not present

## 2023-10-27 DIAGNOSIS — Z7985 Long-term (current) use of injectable non-insulin antidiabetic drugs: Secondary | ICD-10-CM | POA: Diagnosis not present

## 2023-10-27 DIAGNOSIS — Z7952 Long term (current) use of systemic steroids: Secondary | ICD-10-CM | POA: Diagnosis not present

## 2023-10-27 DIAGNOSIS — Z604 Social exclusion and rejection: Secondary | ICD-10-CM | POA: Diagnosis not present

## 2023-10-27 DIAGNOSIS — S51812A Laceration without foreign body of left forearm, initial encounter: Secondary | ICD-10-CM | POA: Diagnosis not present

## 2023-10-27 DIAGNOSIS — E559 Vitamin D deficiency, unspecified: Secondary | ICD-10-CM | POA: Diagnosis not present

## 2023-10-27 DIAGNOSIS — Z9181 History of falling: Secondary | ICD-10-CM | POA: Diagnosis not present

## 2023-10-27 DIAGNOSIS — Z7984 Long term (current) use of oral hypoglycemic drugs: Secondary | ICD-10-CM | POA: Diagnosis not present

## 2023-10-27 DIAGNOSIS — E538 Deficiency of other specified B group vitamins: Secondary | ICD-10-CM | POA: Diagnosis not present

## 2023-10-27 DIAGNOSIS — I10 Essential (primary) hypertension: Secondary | ICD-10-CM | POA: Diagnosis not present

## 2023-10-27 DIAGNOSIS — E1169 Type 2 diabetes mellitus with other specified complication: Secondary | ICD-10-CM | POA: Diagnosis not present

## 2023-10-27 DIAGNOSIS — E639 Nutritional deficiency, unspecified: Secondary | ICD-10-CM | POA: Diagnosis not present

## 2023-10-27 DIAGNOSIS — G7000 Myasthenia gravis without (acute) exacerbation: Secondary | ICD-10-CM | POA: Diagnosis not present

## 2023-10-27 DIAGNOSIS — E1140 Type 2 diabetes mellitus with diabetic neuropathy, unspecified: Secondary | ICD-10-CM | POA: Diagnosis not present

## 2023-10-29 ENCOUNTER — Ambulatory Visit: Admitting: Internal Medicine

## 2023-10-29 DIAGNOSIS — S50912A Unspecified superficial injury of left forearm, initial encounter: Secondary | ICD-10-CM | POA: Diagnosis not present

## 2023-10-29 NOTE — Progress Notes (Signed)
 I saw Jessica Mclean in neurology clinic on 11/06/23 in follow up for AChR ab positive, generalized myasthenia gravis.  HPI: Jessica Mclean is a 78 y.o. year old female with a history of HTN, IDDM, HLD, cervical cancer s/p radiation, TIA, depression, vit D deficiency, sleep apnea who we last saw on 08/07/23.  To briefly review: Initial consultation (11/06/22): Patient woke up over a year ago and had difficulty lifting her head and had vertigo. She called her daughter who is a doctor, who insisted patient go to the hospital. She also had dysarthria. This was first thing in the morning. She had the COVID vaccine the day before, so symptoms were attributed to that. The vertigo resolved. She continues to have falls. She is not sure why. She had an MRI finally in 09/2022 (about 1 month ago) that did not show evidence of a stroke. Over the last year, she has noticed symptoms are worse with fatigue, end of day, and with use. PCP checked AChR blocking ab and was elevated to 52.    Patient has stopped taking all of her medications due to concerns of what would make myasthenia worse.   Current MG symptoms: Ptosis: Yes, especially while watching TV Double vision: Yes Speech: Yes, worse when talking more and at end of day Chewing: Yes Swallowing: Yes, choking on solids. Liquids tend to dribble out of mouth. Breathing: Denies orthopnea Arm strength: No issues Leg strength: Does not think she is weak, but does endorse falls.   She is not on any medication for myasthenia gravis.   Of note patient takes gabapentin  100 mg qhs for diabetic neuropathy. This helps.   Patient is currently going to speech therapy. She is not sure she will continue due to costly co-pay.   She does not report any constitutional symptoms like fever, night sweats, anorexia or unintentional weight loss.   EtOH use: None  Restrictive diet? Vegan, not on B12. She has been eating much less lately, mostly bread and tomatoes Family  history of neuropathy/myopathy/neurologic disease? No   11/19/22: Patient has not gotten her blood work or CT chest. Patient has not been called to get CT chest per her report, but it appears [facility] has been trying to reach patient (per notes).   She got IVIg at home from 11/14/22-11/18/22. She had significant fatigue during the treatment.   Current MG symptoms: Ptosis: About the same as prior Double vision: About the same as prior Speech: Has been clearer Chewing: Better than prior Swallowing: A little improvement Breathing: Did get woken up with shortness of breath Arm strength: Feels weak, which she did not mention at last visit. Fatigues easily. Leg strength: Fatigues easily. Fell this morning.   Current medications:  -Prednisone  20 mg daily -Mestinon  60 mg TID S/p IVIg 0.4 g/kg/day for 5 days (11/14/22-11/18/22)   Side effects: None   12/26/22: AChR abs were again positive, but lab work was otherwise unremarkable, including TPMT activity. CT chest showed no evidence of thymoma. Patient thinks the IVIg is really helping. She take take pills and can swallow much better.   She received her monthly maintenance of IVIg on 12/14/22. She is tolerating this well. Her next dose 01/19/23.   Current MG symptoms: Ptosis: Still having, not sure if this has improved Double vision: Very occasional, much better Speech: Thinks this has improved, still has some slurred Chewing: Much improved Swallowing: Much improved Breathing: Denies orthopnea Arm strength: Has to take frequent breaks to keep strength or will  get weak Leg strength: Has to take frequent breaks as above   Current medications:  -Prednisone  20 mg daily (has only been taking once daily) -Mestinon  60 mg TID   Side effects: Occasional diarrhea   05/12/23: Patient still feels very weak. She will be walking and banging into walls. She has had 1 fall since last visit. She states she is trying to eat more. She has occasional  left sided chest pain.    She feels shaky in her hands. She does not write well.    Patient had IVIg on 01/19/23, 02/18/23, 03/27/23, and 04/28/23 (not scheduled for more at this point). She has been tolerating this well and happy with her home infusion.   Patient was hospitalized from 04/08/23-04/10/23 for two weeks of unsteady gait and generalized weakness. She had an unwitnessed syncopal episode on 04/08/23. CT head showed no acute process. B12 was found to be low, which was thought to be due to poor eating habits. MG exacerbation was felt to be less likely as patient had just gotten IVIg recently and did not have fatigable weakness. Patient was given IM B12 1000 mcg in the hospital and 1000 mcg orally daily at discharge.   Since discharge, she wants to stay in the bed all the time. She does not feel safe walking.   Current MG symptoms: Ptosis: nothing significant Double vision: none Speech: Improved but still some slurring Chewing: has to cut food small, but no difficulty chewing Swallowing: None Breathing: None Arm strength: No significant weakness Leg strength: Feels weak often   Current medications:  -Prednisone  10 mg daily -Mestinon  60 mg TID -Imuran  100 mg daily   Side effects: mild diarrhea   I switched patient to Vyvgart on 05/12/23 due to continued symptoms on IVIg. I reduced prednisone  to 7.5 mg daily.  08/07/23: Insurance denied Vyvgart Hytrulo, but would approve Vyvgart (IV formulation), so I switched order to this. She got her first infusion in 06/2022. I was told patient was having IV access issues, but patient has still been still getting the medication.   She feels she is doing well and improved.   Current MG symptoms: Ptosis: not sure Double vision: none Speech: none Chewing: none Swallowing: none, cuts food small though Breathing: none Arm strength: no change Leg strength: no change   She still occasionally falls and has to hold on to things, but improving.  She is still taking her B12.   Current medications:  -Prednisone  7.5 mg daily -Imuran  100 mg daily -Mestinon  60 mg TID -Vyvgart IV   Side effects: none   Most recent Assessment and Plan (08/07/23): This is Jessica Mclean, a 78 y.o. female with AChR ab positive, generalized myasthenia gravis. CT chest showed no evidence of thymoma. She is s/p IVIg from 11/14/22-11/17/21 and 12/14/22, 01/19/23, 02/18/23, 03/27/23, and 04/28/23. Imuran  was started 12/26/22. She was hospitalized from 04/08/23-04/10/23 for two weeks of unsteady gait and generalized weakness. She was found to have several vitamin deficiencies (vit D, B6, and B12) thought to be the cause. She did not seem to have exacerbation of MG. Patient was started on Vyvgart in 06/2022 due to concerns for poor control of MG despite prednisone , imuran , mestinon , and IVIg. She is doing well on Vyvgart but having trouble with IV access per nursing. I would like to switch to Vyvgart Hytrulo if insurance will allow as this would be much better for the patient.   She has also had multiple previous vitamin deficiencies, including B6, B12 would could  contribute to imbalance and weakness that she sometimes has.   Plan: -Blood work: B1, B6, B12, vit D -Continue B complex -Continue Vyvgart, but will try again to get Vyvgart Hytrulo approved due to poor IV access -Continue Imuran  100 mg daily -Continue Mestinon  60 mg three times daily as needed -Reduce prednisone  to 5 mg daily  Since their last visit: Patient had a fall and cut her arm in 09/2023. She falls almost daily. She does not know why. She has a cane, but is here today without any assistive device. She denies neck or back pain. She has a walker but does not use it. She denies any lightheadedness or dizziness. She also mentions word finding difficulty.  She was doing PT at home but patient fired them because they were late or not showing up.   Current MG symptoms: Ptosis: feels droopy to her Double  vision: none lately Speech: none Chewing: none Swallowing: none Breathing: none Arm strength: feels weak Leg strength: feels weak  Current medications:  -Imuran  100 mg daily -Prednisone  5 mg daily -Mestinon  60 mg taking it as needed, usually every day -Vyvgart Hytrulo once weekly for 4 weeks, with 4 weeks off between cycles: last dose was this week (unclear exact date)  Ppx: -B complex -Vit D and calcium   Side effects: No    MEDICATIONS:  Outpatient Encounter Medications as of 11/06/2023  Medication Sig   ACCU-CHEK GUIDE test strip USE TO CHECK BLOOD SUGAR 4 TIMES DAILY   Accu-Chek Softclix Lancets lancets USE TO CHECK BLOOD SUGAR 4 TIMES DAILY   albuterol  (VENTOLIN  HFA) 108 (90 Base) MCG/ACT inhaler Inhale 2 puffs into the lungs every 6 (six) hours as needed for wheezing or shortness of breath.   amLODipine  (NORVASC ) 10 MG tablet Take 1 tablet (10 mg total) by mouth daily.   aspirin -acetaminophen -caffeine (EXCEDRIN EXTRA STRENGTH) 250-250-65 MG tablet Take 2 tablets by mouth in the morning.   atorvastatin  (LIPITOR) 80 MG tablet Take 1 tablet (80 mg total) by mouth daily.   azaTHIOprine  (IMURAN ) 50 MG tablet Take 2 tablets (100 mg total) by mouth daily. Take 1 tablet (50 mg) for 1 week, then increase to 2 tablets (100 mg) thereafter.   blood glucose meter kit and supplies Dispense based on patient and insurance preference. Use up to four times daily as directed. (FOR ICD-10 E10.9, E11.9).   Blood Glucose Monitoring Suppl (ACCU-CHEK GUIDE) w/Device KIT    calcium -vitamin D  (OSCAL-500) 500-400 MG-UNIT tablet Take 1 tablet by mouth daily.   Coenzyme Q10 (COQ-10 PO) Take by mouth.   Collagen-Vitamin C-Biotin (COLLAGEN PO) Take by mouth.   Continuous Glucose Receiver (FREESTYLE LIBRE 3 READER) DEVI Use it to check blood glucose as instructed.   Continuous Glucose Sensor (FREESTYLE LIBRE 3 SENSOR) MISC Use it to check blood glucose as instructed.   DULoxetine  (CYMBALTA ) 30 MG capsule  Take 1 capsule (30 mg total) by mouth daily.   FLAXSEED, LINSEED, PO Take by mouth.   KRILL OIL PO Take by mouth.   meclizine  (ANTIVERT ) 25 MG tablet Take 1 tablet (25 mg total) by mouth 3 (three) times daily as needed for dizziness.   Melatonin-Pyridoxine (MELATIN PO) Take 10 mg by mouth.   metFORMIN  (GLUCOPHAGE ) 1000 MG tablet Take 1 tablet (1,000 mg total) by mouth 2 (two) times daily with a meal.   Multiple Vitamins-Minerals (PRESERVISION AREDS PO) Take 2 capsules by mouth daily.   omega-3 acid ethyl esters (LOVAZA ) 1 g capsule TAKE 2 CAPSULES BY MOUTH TWICE  DAILY   phenazopyridine  (PYRIDIUM ) 100 MG tablet Take 1 tablet (100 mg total) by mouth 3 (three) times daily as needed for pain (Dysuria).   predniSONE  (DELTASONE ) 2.5 MG tablet Take 2 tablets (5 mg total) by mouth daily with breakfast.   pyridostigmine  (MESTINON ) 60 MG tablet TAKE 1 TABLET BY MOUTH 3 TIMES  DAILY   Semaglutide ,0.25 or 0.5MG /DOS, (OZEMPIC , 0.25 OR 0.5 MG/DOSE,) 2 MG/3ML SOPN Inject 0.5 mg into the skin every 7 (seven) days.   telmisartan  (MICARDIS ) 40 MG tablet Take 1 tablet (40 mg total) by mouth daily.   traMADol  (ULTRAM ) 50 MG tablet Take 1 tablet (50 mg total) by mouth every 12 (twelve) hours as needed.   Urelle  (URELLE /URISED) 81 MG TABS tablet Take 1 tablet (81 mg total) by mouth 4 (four) times daily.   vitamin B-12 (CYANOCOBALAMIN ) 100 MCG tablet Take 1 tablet (100 mcg total) by mouth daily.   [DISCONTINUED] cephALEXin  (KEFLEX ) 500 MG capsule Take 1 capsule (500 mg total) by mouth 3 (three) times daily.   [DISCONTINUED] HYDROcodone -acetaminophen  (NORCO/VICODIN) 5-325 MG tablet Take 2 tablets by mouth every 4 (four) hours as needed.   No facility-administered encounter medications on file as of 11/06/2023.    PAST MEDICAL HISTORY: Past Medical History:  Diagnosis Date   Bleeding disorder (HCC)    Cervical cancer (HCC) 2010   Diabetes mellitus without complication (HCC)    type 2   Diabetes mellitus without  complication (HCC)    Heart murmur    High cholesterol    HLD (hyperlipidemia)    Hypertension    Mental disorder    depression; suicidal ideation in 2017   Sleep apnea    Vitamin D  deficiency     PAST SURGICAL HISTORY: Past Surgical History:  Procedure Laterality Date   BILATERAL OOPHORECTOMY     CESAREAN SECTION     x 4   CHOLECYSTECTOMY     COLOSTOMY     x2   CYSTOSCOPY WITH URETHRAL DILATATION N/A 11/15/2020   Procedure: CYSTOSCOPY WITH URETHRAL DILATION;  Surgeon: Marco Severs, MD;  Location: [facility];  Service: Urology;  Laterality: N/A;   SALPINGECTOMY     TONSILLECTOMY     TONSILLECTOMY AND ADENOIDECTOMY      ALLERGIES: Allergies  Allergen Reactions   Other     Nickel staples   Poison Ivy Treatments     Poison Ivy     FAMILY HISTORY: Family History  Problem Relation Age of Onset   Cancer Father        kidney   Cancer Mother        lung   Other Brother        heart issues    SOCIAL HISTORY: Social History   Tobacco Use   Smoking status: Never   Smokeless tobacco: Never  Vaping Use   Vaping status: Never Used  Substance Use Topics   Alcohol use: Never   Drug use: Never   Social History   Social History Narrative   ** Merged History Encounter **    Right handed   Leave alone-1 level home   Caffeine--soda/coffee    What is your current occupation? retired   Do you live at home alone?yes              Objective:  Vital Signs:  BP 138/73   Pulse 83   Ht 5' 4.5 (1.638 m)   Wt 129 lb (58.5 kg)   LMP  (LMP Unknown)   SpO2  98%   BMI 21.80 kg/m   General: General appearance: Awake and alert. No distress. Cooperative with exam.  HEENT: Atraumatic. Anicteric. Lungs: Non-labored breathing on room air  Heart: Regular Extremities: No edema. Tender to palpation of left forearm were she had recent cut after fall.  Neurological: Mental Status: Alert. Speech fluent. No pseudobulbar affect Cranial Nerves: CNII: No RAPD. Visual  fields intact. CNIII, IV, VI: PERRL. No nystagmus. EOMI. CN V: Facial sensation intact bilaterally to fine touch. CN VII: Facial muscles symmetric and strong. No ptosis at rest. CN VIII: Hears finger rub well bilaterally. CN IX: No hypophonia. CN X: Palate elevates symmetrically. CN XI: Full strength shoulder shrug bilaterally. CN XII: Tongue protrusion full and midline. No atrophy or fasciculations. No significant dysarthria Motor: Tone is normal.  Individual muscle group testing (MRC grade out of 5):  Movement     Neck flexion 5    Neck extension 5     Right Left   Shoulder abduction 5 5 No fatigability  Elbow flexion 5 5   Elbow extension 5 5   Finger extension 5 5   Finger distal flexion 5 5    Hip flexion 5 5   Hip extension 5 5   Hip adduction 5 5   Hip abduction 5 5   Knee extension 5 5   Knee flexion 5 5   Dorsiflexion 5 5   Plantarflexion 5 5    Reflexes:  Right Left  Bicep 2+ 2+  Tricep 2+ 2+  BrRad 2+ 2+  Knee 2+ 2+  Ankle 1+ 1+   Sensation: Pinprick: Intact in all extremities Coordination: Intact finger-to- nose-finger bilaterally. Romberg negative. Gait: Narrow based gait. Walks slowly, states she is off balance, but not clearly ataxic.   Lab and Test Review: New results: 08/07/23: B1 wnl B12: 552 B6 wnl Vit D wnl  HbA1c (09/24/23): 6.2  10/10/23: CBC w/ diff significant for Hb 9.3 (chronic)  CMP significant for glucose 130  Previously reviewed results: 06/23/23: HbA1c: 6.2 CMP unremarkable CBC w/ diff significant for Hb 9.2 (chronic)  04/17/23: B6: 2.9 (low) Folate wnl B1 wnl   04/09/23: B12: 153 CBC significant for Hb 9.5 (chronic), MCV 90.0   04/08/23: Vit D: 27.45 HbA1c: 6.7 TSH wnl CMP unremarkable   01/15/23: HbA1c: 7.3 CMP significant for glucose 179, Cr 1.26   01/19/23: CMP significant for Cr 1.01, glucose 183 CBC significant for Hb 8.4 (previously 9.4), WBC and platelets wnl   11/23/22: B1 wnl TSH wnl TPMT: normal  activity B12: 357 AChR binding ab: 2.05, modulating 81   09/17/22: AChR blocking abs: elevated to 52 HbA1c: 5.7 (high of 9.2 on 01/09/21) CMP unremarkable CBC w/ diff significant for Hb 9.4 (chronic) Lipid panel: Component     Latest Ref Rng 09/17/2022  Cholesterol, Total     100 - 199 mg/dL 161   Triglycerides     0 - 149 mg/dL 096 (H)   HDL Cholesterol     >39 mg/dL 52   VLDL Cholesterol Cal     5 - 40 mg/dL 28   LDL Chol Calc (NIH)     0 - 99 mg/dL 045 (H)   Total CHOL/HDL Ratio     0.0 - 4.4 ratio 3.6     TSH (04/15/22): 1.220 Vit D (04/15/22): 32.2   MRI brain wo contrast (10/14/22): FINDINGS: Brain: Diffusion imaging does not show any acute or subacute infarction. No abnormality affects the brainstem or cerebellum. Incidental perivascular spaces  in the right mid brain. Cerebral hemispheres show mild chronic small-vessel ischemic change of the deep and subcortical white matter. No cortical or large vessel territory stroke. No mass lesion, hemorrhage, hydrocephalus or extra-axial collection.   Vascular: Major vessels at the base of the brain show flow.   Skull and upper cervical spine: Negative   Sinuses/Orbits: Clear/normal   Other: None   IMPRESSION: No acute finding. Mild chronic small-vessel ischemic change of the cerebral hemispheric white matter.   CT chest w contrast (12/03/22): FINDINGS: Cardiovascular: The cardiac size is normal. There is no pericardial effusion. There is three-vessel coronary artery calcification greatest in the proximal LAD.   There are mild scattered calcific plaques in the aorta. The great vessels are clear. There is no aortic aneurysm, dissection or stenosis. The pulmonary arteries and veins are normal caliber. The pulmonary arteries are centrally clear.   Mediastinum/Nodes: No enlarged mediastinal, hilar, or axillary lymph nodes. Thyroid  gland, trachea, and esophagus demonstrate no significant findings.   Lungs/Pleura:  Small right posterior diaphragmatic fat herniation. There is a calcified granuloma in the right lower lobe. The lungs hyperexpanded but clear of infiltrates and nodules. There is no pleural effusion, thickening or pneumothorax.   Upper Abdomen: The liver is mildly steatotic. Gallbladder is absent with prominent common bile duct measuring 12 mm most likely due to prior cholecystectomy, with mild central intrahepatic biliary prominence.   Laboratory and clinical correlation suggested. No acute upper abdominal findings. No chest wall mass is seen.   Musculoskeletal: Osteopenia with thoracic kyphosis and multilevel degenerative disc disease with spondylosis. No acute or other significant osseous findings. The ribcage is intact.   IMPRESSION: 1. No acute chest CT or significant mediastinal findings. 2. Aortic and coronary artery atherosclerosis. 3. Hyperinflated.  No focal infiltrates. 4. Mild hepatic steatosis. 5. Prior cholecystectomy with prominent common bile duct and mild central intrahepatic biliary prominence. 6. Osteopenia and degenerative change.  ASSESSMENT: This is Geniece Akers, a 78 y.o. female with: AChR ab positive generalized MG. CT chest showed no evidence of thymoma. She is s/p IVIg from 11/14/22-11/17/21 and 12/14/22, 01/19/23, 02/18/23, 03/27/23, and 04/28/23. Imuran  was started 12/26/22. Patient was started on Vyvgart in 06/2022 due to concerns for poor control of MG despite prednisone , imuran , mestinon , and IVIg. She is doing well on Vyvgart but having trouble with IV access per nursing. She was switched to Vyvgart Hytrulo in 09/2023. Her MG appears to be well controlled with minimal manifestations currently. Frequent falls - the etiology of this is unclear. There is no objective leg weakness. Reflexes and sensation are normal arguing against myelopathy or neuropathy. She does not have neck or back pain. I am most concerned about food insecurity. Patient does not appear to be  eating well. She was found to have several vitamin deficiencies (vit D, B6, and B12) thought to be the cause. She did not seem to have exacerbation of MG. We discussed meals on wheels and talking to her daughter, who is a physician out of state, about these issues. She would benefit from PT. B12 deficiency Vit D deficiency B6 deficiency  Plan: -Continue Vyvgart Hytrulo 4 weeks on and 4 weeks off - home injections with [facility] currently -Continue Imuran  100 mg daily -Reduce Prednisone  to 2.5 mg daily -Continue Mestinon  60 mg TID PRN  For bone health, I recommend: Vit D 1000 international units daily Calcium  intake of 1000-1200 mg/day (diet or supplemental)  -Continue B complex  -Will retry PT for imbalance and falls -Discussed fall  precautions  Return to clinic in 3 months  Total time spent reviewing records, interview, history/exam, documentation, and coordination of care on day of encounter:  40 min  Rommie Coats, MD

## 2023-11-02 DIAGNOSIS — E559 Vitamin D deficiency, unspecified: Secondary | ICD-10-CM | POA: Diagnosis not present

## 2023-11-02 DIAGNOSIS — E1140 Type 2 diabetes mellitus with diabetic neuropathy, unspecified: Secondary | ICD-10-CM | POA: Diagnosis not present

## 2023-11-02 DIAGNOSIS — Z8673 Personal history of transient ischemic attack (TIA), and cerebral infarction without residual deficits: Secondary | ICD-10-CM | POA: Diagnosis not present

## 2023-11-02 DIAGNOSIS — Z9181 History of falling: Secondary | ICD-10-CM | POA: Diagnosis not present

## 2023-11-02 DIAGNOSIS — S51812A Laceration without foreign body of left forearm, initial encounter: Secondary | ICD-10-CM | POA: Diagnosis not present

## 2023-11-02 DIAGNOSIS — E1169 Type 2 diabetes mellitus with other specified complication: Secondary | ICD-10-CM | POA: Diagnosis not present

## 2023-11-02 DIAGNOSIS — E782 Mixed hyperlipidemia: Secondary | ICD-10-CM | POA: Diagnosis not present

## 2023-11-02 DIAGNOSIS — G7000 Myasthenia gravis without (acute) exacerbation: Secondary | ICD-10-CM | POA: Diagnosis not present

## 2023-11-02 DIAGNOSIS — I10 Essential (primary) hypertension: Secondary | ICD-10-CM | POA: Diagnosis not present

## 2023-11-02 DIAGNOSIS — Z7984 Long term (current) use of oral hypoglycemic drugs: Secondary | ICD-10-CM | POA: Diagnosis not present

## 2023-11-02 DIAGNOSIS — R296 Repeated falls: Secondary | ICD-10-CM | POA: Diagnosis not present

## 2023-11-02 DIAGNOSIS — Z604 Social exclusion and rejection: Secondary | ICD-10-CM | POA: Diagnosis not present

## 2023-11-02 DIAGNOSIS — E639 Nutritional deficiency, unspecified: Secondary | ICD-10-CM | POA: Diagnosis not present

## 2023-11-02 DIAGNOSIS — E538 Deficiency of other specified B group vitamins: Secondary | ICD-10-CM | POA: Diagnosis not present

## 2023-11-02 DIAGNOSIS — Z7952 Long term (current) use of systemic steroids: Secondary | ICD-10-CM | POA: Diagnosis not present

## 2023-11-02 DIAGNOSIS — Z7985 Long-term (current) use of injectable non-insulin antidiabetic drugs: Secondary | ICD-10-CM | POA: Diagnosis not present

## 2023-11-04 ENCOUNTER — Telehealth: Admitting: Internal Medicine

## 2023-11-04 ENCOUNTER — Telehealth: Payer: Self-pay

## 2023-11-04 ENCOUNTER — Encounter: Payer: Self-pay | Admitting: Internal Medicine

## 2023-11-04 DIAGNOSIS — S51812D Laceration without foreign body of left forearm, subsequent encounter: Secondary | ICD-10-CM | POA: Diagnosis not present

## 2023-11-04 MED ORDER — TRAMADOL HCL 50 MG PO TABS: 50.0000 mg | ORAL_TABLET | Freq: Two times a day (BID) | ORAL | 0 refills | Status: DC | PRN

## 2023-11-04 NOTE — Telephone Encounter (Signed)
 Called patient could not get the phone number to go through.

## 2023-11-04 NOTE — Patient Instructions (Signed)
 Please take tramadol  as needed for severe pain.  Please continue wound care with home health.

## 2023-11-04 NOTE — Assessment & Plan Note (Signed)
 Wound appears well-healing Needs to continue wound care at home with home health, does not have any help at home, she has chronic weakness of UE and LE due to myasthenia gravis - referred to home health for wound care Completed Keflex  for bacterial Ppx Tramadol  as needed for severe pain

## 2023-11-04 NOTE — Progress Notes (Signed)
 Virtual Visit via Video Note   Because of Jessica Mclean's co-morbid illnesses, she is at least at moderate risk for complications without adequate follow up.  This format is felt to be most appropriate for this patient at this time.  All issues noted in this document were discussed and addressed.  A limited physical exam was performed with this format.      Evaluation Performed:  Follow-up visit  Date:  11/04/2023   ID:  Jessica Mclean, DOB December 30, 1945, MRN [id number]  Patient Location: Home Provider Location: Office/Clinic  Participants: Patient Location of Patient: Home Location of Provider: Telehealth Consent was obtain for visit to be over via telehealth. I verified that I am speaking with the correct person using two identifiers.  PCP:  Meldon Sport, MD   Chief Complaint: Follow up of arm wound  History of Present Illness:    Jessica Mclean is a 78 y.o. female with PMH of HTN, DM2, TIA, HLD and cervical ca. s/p radiation who has a video visit for follow up of forearm laceration.   She was working outside, fell on a pile of wooden brush and believes that there was a metal wire in the brush that cut her arm.  She has laceration over left forearm.  She had bandaged and controlled bleeding prior to reaching ER.  In ER, she had proper wound cleaning and dressing done.  She had Tdap vaccination during ER visit.  She had follow-up with me after ER visit, wound was evaluated and dressing was changed.  She was referred to home health for wound care.  Today, she reports adequate wound healing.  She still reports pain over the wound area, especially as it being close to elbow.  She has run out of Norco, that was prescribed from ER.  Denies any fever or chills.  She has completed Keflex  for bacterial PPx.  The patient does not have symptoms concerning for COVID-19 infection (fever, chills, cough, or new shortness of breath).   Past Medical, Surgical, Social History, Allergies, and  Medications have been Reviewed.  Past Medical History:  Diagnosis Date   Bleeding disorder (HCC)    Cervical cancer (HCC) 2010   Diabetes mellitus without complication (HCC)    type 2   Diabetes mellitus without complication (HCC)    Heart murmur    High cholesterol    HLD (hyperlipidemia)    Hypertension    Mental disorder    depression; suicidal ideation in 2017   Sleep apnea    Vitamin D  deficiency    Past Surgical History:  Procedure Laterality Date   BILATERAL OOPHORECTOMY     CESAREAN SECTION     x 4   CHOLECYSTECTOMY     COLOSTOMY     x2   CYSTOSCOPY WITH URETHRAL DILATATION N/A 11/15/2020   Procedure: CYSTOSCOPY WITH URETHRAL DILATION;  Surgeon: Marco Severs, MD;  Location: [facility];  Service: Urology;  Laterality: N/A;   SALPINGECTOMY     TONSILLECTOMY     TONSILLECTOMY AND ADENOIDECTOMY       Current Meds  Medication Sig   ACCU-CHEK GUIDE test strip USE TO CHECK BLOOD SUGAR 4 TIMES DAILY   Accu-Chek Softclix Lancets lancets USE TO CHECK BLOOD SUGAR 4 TIMES DAILY   albuterol  (VENTOLIN  HFA) 108 (90 Base) MCG/ACT inhaler Inhale 2 puffs into the lungs every 6 (six) hours as needed for wheezing or shortness of breath.   amLODipine  (NORVASC ) 10 MG tablet Take 1 tablet (10  mg total) by mouth daily.   aspirin -acetaminophen -caffeine (EXCEDRIN EXTRA STRENGTH) 250-250-65 MG tablet Take 2 tablets by mouth in the morning.   atorvastatin  (LIPITOR) 80 MG tablet Take 1 tablet (80 mg total) by mouth daily.   azaTHIOprine  (IMURAN ) 50 MG tablet Take 2 tablets (100 mg total) by mouth daily. Take 1 tablet (50 mg) for 1 week, then increase to 2 tablets (100 mg) thereafter.   blood glucose meter kit and supplies Dispense based on patient and insurance preference. Use up to four times daily as directed. (FOR ICD-10 E10.9, E11.9).   Blood Glucose Monitoring Suppl (ACCU-CHEK GUIDE) w/Device KIT    calcium -vitamin D  (OSCAL-500) 500-400 MG-UNIT tablet Take 1 tablet by mouth daily.    Coenzyme Q10 (COQ-10 PO) Take by mouth.   Collagen-Vitamin C-Biotin (COLLAGEN PO) Take by mouth.   Continuous Glucose Receiver (FREESTYLE LIBRE 3 READER) DEVI Use it to check blood glucose as instructed.   Continuous Glucose Sensor (FREESTYLE LIBRE 3 SENSOR) MISC Use it to check blood glucose as instructed.   DULoxetine  (CYMBALTA ) 30 MG capsule Take 1 capsule (30 mg total) by mouth daily.   FLAXSEED, LINSEED, PO Take by mouth.   KRILL OIL PO Take by mouth.   meclizine  (ANTIVERT ) 25 MG tablet Take 1 tablet (25 mg total) by mouth 3 (three) times daily as needed for dizziness.   Melatonin-Pyridoxine (MELATIN PO) Take 10 mg by mouth.   metFORMIN  (GLUCOPHAGE ) 1000 MG tablet Take 1 tablet (1,000 mg total) by mouth 2 (two) times daily with a meal.   Multiple Vitamins-Minerals (PRESERVISION AREDS PO) Take 2 capsules by mouth daily.   omega-3 acid ethyl esters (LOVAZA ) 1 g capsule TAKE 2 CAPSULES BY MOUTH TWICE  DAILY   phenazopyridine  (PYRIDIUM ) 100 MG tablet Take 1 tablet (100 mg total) by mouth 3 (three) times daily as needed for pain (Dysuria).   predniSONE  (DELTASONE ) 2.5 MG tablet Take 2 tablets (5 mg total) by mouth daily with breakfast.   pyridostigmine  (MESTINON ) 60 MG tablet TAKE 1 TABLET BY MOUTH 3 TIMES  DAILY   Semaglutide ,0.25 or 0.5MG /DOS, (OZEMPIC , 0.25 OR 0.5 MG/DOSE,) 2 MG/3ML SOPN Inject 0.5 mg into the skin every 7 (seven) days.   telmisartan  (MICARDIS ) 40 MG tablet Take 1 tablet (40 mg total) by mouth daily.   traMADol  (ULTRAM ) 50 MG tablet Take 1 tablet (50 mg total) by mouth every 12 (twelve) hours as needed.   Urelle  (URELLE /URISED) 81 MG TABS tablet Take 1 tablet (81 mg total) by mouth 4 (four) times daily.   vitamin B-12 (CYANOCOBALAMIN ) 100 MCG tablet Take 1 tablet (100 mcg total) by mouth daily.   [DISCONTINUED] cephALEXin  (KEFLEX ) 500 MG capsule Take 1 capsule (500 mg total) by mouth 3 (three) times daily.   [DISCONTINUED] HYDROcodone -acetaminophen  (NORCO/VICODIN) 5-325 MG  tablet Take 2 tablets by mouth every 4 (four) hours as needed.     Allergies:   Other and Poison ivy treatments   ROS:   Please see the history of present illness. All other systems reviewed and are negative.   Labs/Other Tests and Data Reviewed:    Recent Labs: 04/08/2023: TSH 0.484 10/10/2023: ALT 12; BUN 16; Creatinine, Ser 0.87; Hemoglobin 9.3; Platelets 286; Potassium 4.2; Sodium 136   Recent Lipid Panel Lab Results  Component Value Date/Time   CHOL 185 09/17/2022 02:14 PM   TRIG 159 (H) 09/17/2022 02:14 PM   HDL 52 09/17/2022 02:14 PM   CHOLHDL 3.6 09/17/2022 02:14 PM   CHOLHDL 5.7 11/23/2019 03:04 PM  LDLCALC 105 (H) 09/17/2022 02:14 PM    Wt Readings from Last 3 Encounters:  10/15/23 125 lb (56.7 kg)  10/14/23 130 lb (59 kg)  10/10/23 120 lb (54.4 kg)     Objective:    Vital Signs:  LMP  (LMP Unknown)    VITAL SIGNS:  reviewed GEN:  no acute distress EYES:  sclerae anicteric, EOMI - Extraocular Movements Intact RESPIRATORY:  normal respiratory effort, symmetric expansion SKIN:   Semicircular laceration over left forearm, about 6 cm in length -appears well-healing compared to the previous visit NEURO:  alert and oriented x 3, no obvious focal deficit PSYCH:  normal affect  ASSESSMENT & PLAN:    Laceration of left forearm Wound appears well-healing Needs to continue wound care at home with home health, does not have any help at home, she has chronic weakness of UE and LE due to myasthenia gravis - referred to home health for wound care Completed Keflex  for bacterial Ppx Tramadol  as needed for severe pain    I discussed the assessment and treatment plan with the patient. The patient was provided an opportunity to ask questions, and all were answered. The patient agreed with the plan and demonstrated an understanding of the instructions.   The patient was advised to call back or seek an in-person evaluation if the symptoms worsen or if the condition fails  to improve as anticipated.  The above assessment and management plan was discussed with the patient. The patient verbalized understanding of and has agreed to the management plan.   Medication Adjustments/Labs and Tests Ordered: Current medicines are reviewed at length with the patient today.  Concerns regarding medicines are outlined above.   Tests Ordered: No orders of the defined types were placed in this encounter.   Medication Changes: Meds ordered this encounter  Medications   traMADol  (ULTRAM ) 50 MG tablet    Sig: Take 1 tablet (50 mg total) by mouth every 12 (twelve) hours as needed.    Dispense:  20 tablet    Refill:  0    Please deliver to the patient.     Note: This dictation was prepared with Dragon dictation along with smaller phrase technology. Similar sounding words can be transcribed inadequately or may not be corrected upon review. Any transcriptional errors that result from this process are unintentional.      Disposition:  Follow up  Signed, Meldon Sport, MD  11/04/2023 2:37 PM     [facility]

## 2023-11-04 NOTE — Telephone Encounter (Signed)
 Attempted to contact pt, per Dr. Lydia Sams okay to switch to video/telephone.

## 2023-11-04 NOTE — Telephone Encounter (Signed)
 Copied from CRM [id number]. Topic: Appointments - Scheduling Inquiry for Clinic >> Nov 04, 2023 10:42 AM Everette C wrote: Reason for CRM: The patient has requested to be contacted by a member of practice staff to discuss changing their appointment type from in person to telephone. Please contact the patient further if/when possible

## 2023-11-04 NOTE — Telephone Encounter (Signed)
 Pt was able to connect for visit.

## 2023-11-05 DIAGNOSIS — E639 Nutritional deficiency, unspecified: Secondary | ICD-10-CM | POA: Diagnosis not present

## 2023-11-05 DIAGNOSIS — Z9181 History of falling: Secondary | ICD-10-CM | POA: Diagnosis not present

## 2023-11-05 DIAGNOSIS — G7000 Myasthenia gravis without (acute) exacerbation: Secondary | ICD-10-CM | POA: Diagnosis not present

## 2023-11-05 DIAGNOSIS — E1140 Type 2 diabetes mellitus with diabetic neuropathy, unspecified: Secondary | ICD-10-CM | POA: Diagnosis not present

## 2023-11-05 DIAGNOSIS — Z8673 Personal history of transient ischemic attack (TIA), and cerebral infarction without residual deficits: Secondary | ICD-10-CM | POA: Diagnosis not present

## 2023-11-05 DIAGNOSIS — E782 Mixed hyperlipidemia: Secondary | ICD-10-CM | POA: Diagnosis not present

## 2023-11-05 DIAGNOSIS — I10 Essential (primary) hypertension: Secondary | ICD-10-CM | POA: Diagnosis not present

## 2023-11-05 DIAGNOSIS — Z7985 Long-term (current) use of injectable non-insulin antidiabetic drugs: Secondary | ICD-10-CM | POA: Diagnosis not present

## 2023-11-05 DIAGNOSIS — E538 Deficiency of other specified B group vitamins: Secondary | ICD-10-CM | POA: Diagnosis not present

## 2023-11-05 DIAGNOSIS — S51812A Laceration without foreign body of left forearm, initial encounter: Secondary | ICD-10-CM | POA: Diagnosis not present

## 2023-11-05 DIAGNOSIS — E559 Vitamin D deficiency, unspecified: Secondary | ICD-10-CM | POA: Diagnosis not present

## 2023-11-05 DIAGNOSIS — E1169 Type 2 diabetes mellitus with other specified complication: Secondary | ICD-10-CM | POA: Diagnosis not present

## 2023-11-05 DIAGNOSIS — Z604 Social exclusion and rejection: Secondary | ICD-10-CM | POA: Diagnosis not present

## 2023-11-05 DIAGNOSIS — Z7984 Long term (current) use of oral hypoglycemic drugs: Secondary | ICD-10-CM | POA: Diagnosis not present

## 2023-11-05 DIAGNOSIS — Z7952 Long term (current) use of systemic steroids: Secondary | ICD-10-CM | POA: Diagnosis not present

## 2023-11-05 DIAGNOSIS — R296 Repeated falls: Secondary | ICD-10-CM | POA: Diagnosis not present

## 2023-11-06 ENCOUNTER — Ambulatory Visit (INDEPENDENT_AMBULATORY_CARE_PROVIDER_SITE_OTHER): Admitting: Neurology

## 2023-11-06 ENCOUNTER — Encounter: Payer: Self-pay | Admitting: Neurology

## 2023-11-06 VITALS — BP 138/73 | HR 83 | Ht 64.5 in | Wt 129.0 lb

## 2023-11-06 DIAGNOSIS — R471 Dysarthria and anarthria: Secondary | ICD-10-CM

## 2023-11-06 DIAGNOSIS — H532 Diplopia: Secondary | ICD-10-CM | POA: Diagnosis not present

## 2023-11-06 DIAGNOSIS — M6281 Muscle weakness (generalized): Secondary | ICD-10-CM

## 2023-11-06 DIAGNOSIS — G7000 Myasthenia gravis without (acute) exacerbation: Secondary | ICD-10-CM | POA: Diagnosis not present

## 2023-11-06 DIAGNOSIS — R296 Repeated falls: Secondary | ICD-10-CM

## 2023-11-06 DIAGNOSIS — R1310 Dysphagia, unspecified: Secondary | ICD-10-CM

## 2023-11-06 DIAGNOSIS — R269 Unspecified abnormalities of gait and mobility: Secondary | ICD-10-CM | POA: Diagnosis not present

## 2023-11-06 DIAGNOSIS — H02403 Unspecified ptosis of bilateral eyelids: Secondary | ICD-10-CM

## 2023-11-06 NOTE — Patient Instructions (Addendum)
 I will reorder physical therapy to help you with imbalance and falls.  For myasthenia gravis: -Continue Vyvgart Hytrulo 4 weeks on and 4 weeks off - home injections with [facility] currently -Continue Imuran  100 mg daily -Reduce prednisone  to 2.5 mg daily -Mestinon  60 mg three times as needed  For bone health while on steroids, I recommend: Vit D 1000 international units daily Calcium  intake of 1000-1200 mg/day (diet or supplemental)  Continue your B complex.  I will see you back in clinic in 3 months.  Go to nearest emergency room if you have severe weakness, difficulty breathing or swallowing as this could be a myasthenic crisis (flare).  The physicians and staff at [facility] are committed to providing excellent care. You may receive a survey requesting feedback about your experience at our office. We strive to receive very good responses to the survey questions. If you feel that your experience would prevent you from giving the office a very good  response, please contact our office to try to remedy the situation. We may be reached at [phone]. Thank you for taking the time out of your busy day to complete the survey.  Rommie Coats, MD [facility]   Preventing Falls at [facility] are common, often dreaded events in the lives of older people. Aside from the obvious injuries and even death that may result, fall can cause wide-ranging consequences including loss of independence, mental decline, decreased activity and mobility. Younger people are also at risk of falling, especially those with chronic illnesses and fatigue.  Ways to reduce risk for falling Examine diet and medications. Warm foods and alcohol dilate blood vessels, which can lead to dizziness when standing. Sleep aids, antidepressants and pain medications can also increase the likelihood of a fall.  Get a vision exam. Poor vision, cataracts and glaucoma increase the chances of falling.  Check  foot gear. Shoes should fit snugly and have a sturdy, nonskid sole and a broad, low heel  Participate in a physician-approved exercise program to build and maintain muscle strength and improve balance and coordination. Programs that use ankle weights or stretch bands are excellent for muscle-strengthening. Water  aerobics programs and low-impact Tai Chi programs have also been shown to improve balance and coordination.  Increase vitamin D  intake. Vitamin D  improves muscle strength and increases the amount of calcium  the body is able to absorb and deposit in bones.  How to prevent falls from common hazards Floors - Remove all loose wires, cords, and throw rugs. Minimize clutter. Make sure rugs are anchored and smooth. Keep furniture in its usual place.  Chairs -- Use chairs with straight backs, armrests and firm seats. Add firm cushions to existing pieces to add height.  Bathroom - Install grab bars and non-skid tape in the tub or shower. Use a bathtub transfer bench or a shower chair with a back support Use an elevated toilet seat and/or safety rails to assist standing from a low surface. Do not use towel racks or bathroom tissue holders to help you stand.  Lighting - Make sure halls, stairways, and entrances are well-lit. Install a night light in your bathroom or hallway. Make sure there is a light switch at the top and bottom of the staircase. Turn lights on if you get up in the middle of the night. Make sure lamps or light switches are within reach of the bed if you have to get up during the night.  Kitchen - Install non-skid rubber mats near the  sink and stove. Clean spills immediately. Store frequently used utensils, pots, pans between waist and eye level. This helps prevent reaching and bending. Sit when getting things out of lower cupboards.  Living room/ Bedrooms - Place furniture with wide spaces in between, giving enough room to move around. Establish a route through the living room that  gives you something to hold onto as you walk.  Stairs - Make sure treads, rails, and rugs are secure. Install a rail on both sides of the stairs. If stairs are a threat, it might be helpful to arrange most of your activities on the lower level to reduce the number of times you must climb the stairs.  Entrances and doorways - Install metal handles on the walls adjacent to the doorknobs of all doors to make it more secure as you travel through the doorway.  Tips for maintaining balance Keep at least one hand free at all times. Try using a backpack or fanny pack to hold things rather than carrying them in your hands. Never carry objects in both hands when walking as this interferes with keeping your balance.  Attempt to swing both arms from front to back while walking. This might require a conscious effort if Parkinson's disease has diminished your movement. It will, however, help you to maintain balance and posture, and reduce fatigue.  Consciously lift your feet off of the ground when walking. Shuffling and dragging of the feet is a common culprit in losing your balance.  When trying to navigate turns, use a U technique of facing forward and making a wide turn, rather than pivoting sharply.  Try to stand with your feet shoulder-length apart. When your feet are close together for any length of time, you increase your risk of losing your balance and falling.  Do one thing at a time. Don't try to walk and accomplish another task, such as reading or looking around. The decrease in your automatic reflexes complicates motor function, so the less distraction, the better.  Do not wear rubber or gripping soled shoes, they might catch on the floor and cause tripping.  Move slowly when changing positions. Use deliberate, concentrated movements and, if needed, use a grab bar or walking aid. Count 15 seconds between each movement. For example, when rising from a seated position, wait 15 seconds after  standing to begin walking.  If balance is a continuous problem, you might want to consider a walking aid such as a cane, walking stick, or walker. Once you've mastered walking with help, you might be ready to try it on your own again.

## 2023-11-11 DIAGNOSIS — E1169 Type 2 diabetes mellitus with other specified complication: Secondary | ICD-10-CM | POA: Diagnosis not present

## 2023-11-11 DIAGNOSIS — Z9181 History of falling: Secondary | ICD-10-CM | POA: Diagnosis not present

## 2023-11-11 DIAGNOSIS — I10 Essential (primary) hypertension: Secondary | ICD-10-CM | POA: Diagnosis not present

## 2023-11-11 DIAGNOSIS — E639 Nutritional deficiency, unspecified: Secondary | ICD-10-CM | POA: Diagnosis not present

## 2023-11-11 DIAGNOSIS — S51812A Laceration without foreign body of left forearm, initial encounter: Secondary | ICD-10-CM | POA: Diagnosis not present

## 2023-11-11 DIAGNOSIS — E559 Vitamin D deficiency, unspecified: Secondary | ICD-10-CM | POA: Diagnosis not present

## 2023-11-11 DIAGNOSIS — Z7984 Long term (current) use of oral hypoglycemic drugs: Secondary | ICD-10-CM | POA: Diagnosis not present

## 2023-11-11 DIAGNOSIS — Z7985 Long-term (current) use of injectable non-insulin antidiabetic drugs: Secondary | ICD-10-CM | POA: Diagnosis not present

## 2023-11-11 DIAGNOSIS — G7000 Myasthenia gravis without (acute) exacerbation: Secondary | ICD-10-CM | POA: Diagnosis not present

## 2023-11-11 DIAGNOSIS — Z8673 Personal history of transient ischemic attack (TIA), and cerebral infarction without residual deficits: Secondary | ICD-10-CM | POA: Diagnosis not present

## 2023-11-11 DIAGNOSIS — R296 Repeated falls: Secondary | ICD-10-CM | POA: Diagnosis not present

## 2023-11-11 DIAGNOSIS — E782 Mixed hyperlipidemia: Secondary | ICD-10-CM | POA: Diagnosis not present

## 2023-11-11 DIAGNOSIS — Z7952 Long term (current) use of systemic steroids: Secondary | ICD-10-CM | POA: Diagnosis not present

## 2023-11-11 DIAGNOSIS — Z604 Social exclusion and rejection: Secondary | ICD-10-CM | POA: Diagnosis not present

## 2023-11-11 DIAGNOSIS — E538 Deficiency of other specified B group vitamins: Secondary | ICD-10-CM | POA: Diagnosis not present

## 2023-11-11 DIAGNOSIS — E1140 Type 2 diabetes mellitus with diabetic neuropathy, unspecified: Secondary | ICD-10-CM | POA: Diagnosis not present

## 2023-11-13 DIAGNOSIS — E559 Vitamin D deficiency, unspecified: Secondary | ICD-10-CM | POA: Diagnosis not present

## 2023-11-13 DIAGNOSIS — Z9181 History of falling: Secondary | ICD-10-CM | POA: Diagnosis not present

## 2023-11-13 DIAGNOSIS — Z7984 Long term (current) use of oral hypoglycemic drugs: Secondary | ICD-10-CM | POA: Diagnosis not present

## 2023-11-13 DIAGNOSIS — E1169 Type 2 diabetes mellitus with other specified complication: Secondary | ICD-10-CM | POA: Diagnosis not present

## 2023-11-13 DIAGNOSIS — E639 Nutritional deficiency, unspecified: Secondary | ICD-10-CM | POA: Diagnosis not present

## 2023-11-13 DIAGNOSIS — S51812A Laceration without foreign body of left forearm, initial encounter: Secondary | ICD-10-CM | POA: Diagnosis not present

## 2023-11-13 DIAGNOSIS — E538 Deficiency of other specified B group vitamins: Secondary | ICD-10-CM | POA: Diagnosis not present

## 2023-11-13 DIAGNOSIS — G7000 Myasthenia gravis without (acute) exacerbation: Secondary | ICD-10-CM | POA: Diagnosis not present

## 2023-11-13 DIAGNOSIS — R296 Repeated falls: Secondary | ICD-10-CM | POA: Diagnosis not present

## 2023-11-13 DIAGNOSIS — Z7952 Long term (current) use of systemic steroids: Secondary | ICD-10-CM | POA: Diagnosis not present

## 2023-11-13 DIAGNOSIS — E782 Mixed hyperlipidemia: Secondary | ICD-10-CM | POA: Diagnosis not present

## 2023-11-13 DIAGNOSIS — Z604 Social exclusion and rejection: Secondary | ICD-10-CM | POA: Diagnosis not present

## 2023-11-13 DIAGNOSIS — Z8673 Personal history of transient ischemic attack (TIA), and cerebral infarction without residual deficits: Secondary | ICD-10-CM | POA: Diagnosis not present

## 2023-11-13 DIAGNOSIS — E1140 Type 2 diabetes mellitus with diabetic neuropathy, unspecified: Secondary | ICD-10-CM | POA: Diagnosis not present

## 2023-11-13 DIAGNOSIS — I10 Essential (primary) hypertension: Secondary | ICD-10-CM | POA: Diagnosis not present

## 2023-11-13 DIAGNOSIS — Z7985 Long-term (current) use of injectable non-insulin antidiabetic drugs: Secondary | ICD-10-CM | POA: Diagnosis not present

## 2023-11-17 ENCOUNTER — Telehealth: Payer: Self-pay | Admitting: Neurology

## 2023-11-17 DIAGNOSIS — E1169 Type 2 diabetes mellitus with other specified complication: Secondary | ICD-10-CM | POA: Diagnosis not present

## 2023-11-17 DIAGNOSIS — E1140 Type 2 diabetes mellitus with diabetic neuropathy, unspecified: Secondary | ICD-10-CM | POA: Diagnosis not present

## 2023-11-17 DIAGNOSIS — E785 Hyperlipidemia, unspecified: Secondary | ICD-10-CM | POA: Diagnosis not present

## 2023-11-17 DIAGNOSIS — G7000 Myasthenia gravis without (acute) exacerbation: Secondary | ICD-10-CM | POA: Diagnosis not present

## 2023-11-17 DIAGNOSIS — I10 Essential (primary) hypertension: Secondary | ICD-10-CM | POA: Diagnosis not present

## 2023-11-17 NOTE — Telephone Encounter (Signed)
 Physical therapist Needs verbal ok 2x 3 weeks & 1x 3weeks, call back

## 2023-11-18 NOTE — Telephone Encounter (Signed)
 Verbal orders per Dr. Leigh and paper orders sent back to therapy. 11/18/23

## 2023-12-03 ENCOUNTER — Ambulatory Visit: Payer: Self-pay | Admitting: *Deleted

## 2023-12-03 NOTE — Telephone Encounter (Signed)
 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 Advised 911- but patient is refusing. Call sent to office high priority. Reason for Disposition  [1] SEVERE weakness (e.g., unable to walk or barely able to walk, requires support) AND [2] new-onset or getting worse  Answer Assessment - Initial Assessment Questions 1. DESCRIPTION: Describe how you are feeling.     Hard to get up to walk- weak , T 100.9, BP 150/90 80-all vital clear/normal 2. SEVERITY: How bad is it?  Can you stand and walk?     Change from last week- increased weakness 3. ONSET: When did these symptoms begin? (e.g., hours, days, weeks, months)     yesterday 4. CAUSE: What do you think is causing the weakness or fatigue? (e.g., not drinking enough fluids, medical problem, trouble sleeping)     [facility] has been called due to patient living area- concerned about- black mold, refrigerator not working properly, house cluttered- lives alone- has 3 dogs 5. NEW MEDICINES:  Have you started on any new medicines recently? (e.g., opioid pain medicines, benzodiazepines, muscle relaxants, antidepressants, antihistamines, neuroleptics, beta blockers)     no 6. OTHER SYMPTOMS: Do you have any other symptoms? (e.g., chest pain, fever, cough, SOB, vomiting, diarrhea, bleeding, other areas of pain)     fever  Protocols used: Weakness (Generalized) and Fatigue-A-AH    Copied from CRM [id number]. 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

## 2023-12-11 ENCOUNTER — Telehealth: Payer: Self-pay

## 2023-12-11 NOTE — Telephone Encounter (Signed)
 Copied from CRM [id number]. Topic: Clinical - Prescription Issue >> Dec 11, 2023  1:19 PM Montie POUR wrote: Reason for CRM:   [employer] sent Jacelyn a letter stated they will no longer pay for 1 Test Touch and All Strips. They will pay for Accu-Check

## 2023-12-15 ENCOUNTER — Other Ambulatory Visit: Payer: Self-pay

## 2023-12-15 MED ORDER — LANCET DEVICE MISC: 1.0000 | Freq: Three times a day (TID) | 0 refills | Status: AC

## 2023-12-15 MED ORDER — LANCETS MISC. MISC: 1.0000 | Freq: Three times a day (TID) | 0 refills | Status: AC

## 2023-12-15 MED ORDER — BLOOD GLUCOSE TEST VI STRP: 1.0000 | ORAL_STRIP | Freq: Three times a day (TID) | 0 refills | Status: AC

## 2023-12-15 MED ORDER — BLOOD GLUCOSE MONITORING SUPPL DEVI
1.0000 | Freq: Three times a day (TID) | 0 refills | Status: AC
Start: 2023-12-15 — End: ?

## 2023-12-15 NOTE — Telephone Encounter (Signed)
 Sent!

## 2023-12-21 ENCOUNTER — Telehealth: Payer: Self-pay

## 2023-12-21 ENCOUNTER — Telehealth: Payer: Self-pay | Admitting: Neurology

## 2023-12-21 NOTE — Telephone Encounter (Signed)
Accucheck sent.

## 2023-12-21 NOTE — Telephone Encounter (Signed)
 If they call back, they need to check at there pharmacy or with there insurance. Nothing to be done on our end.

## 2023-12-21 NOTE — Telephone Encounter (Signed)
 No answer at 12/21/2023 at 1:51pm

## 2023-12-21 NOTE — Telephone Encounter (Signed)
 Pt. Cld to have predniSONE  (DELTASONE ) Rx be the generic as her Rx insurance formulary has changed, or if still covered leave the same. Pls call back to confirm

## 2023-12-21 NOTE — Telephone Encounter (Signed)
 Copied from CRM [id number]. Topic: Clinical - Prescription Issue >> Dec 11, 2023  1:19 PM Montie POUR wrote: Reason for CRM:   [employer] sent Jacelyn a letter stated they will no longer pay for 1 Test Touch and All Strips. They will pay for Accu-Check

## 2023-12-21 NOTE — Telephone Encounter (Signed)
 Called pt and her insurance is changing there prices and she wanted to see if

## 2023-12-22 NOTE — Telephone Encounter (Signed)
Patient is returning a call to renee.

## 2023-12-22 NOTE — Telephone Encounter (Signed)
 Called and she did know if I was returning the call from yesterday. She heard the message from [place] and thought someone needed something else. She is okay and had no questions or concerns.

## 2023-12-24 ENCOUNTER — Other Ambulatory Visit: Payer: Self-pay | Admitting: Internal Medicine

## 2023-12-24 DIAGNOSIS — I10 Essential (primary) hypertension: Secondary | ICD-10-CM

## 2023-12-31 ENCOUNTER — Other Ambulatory Visit (HOSPITAL_COMMUNITY): Payer: Self-pay

## 2023-12-31 ENCOUNTER — Telehealth: Payer: Self-pay

## 2023-12-31 NOTE — Telephone Encounter (Signed)
 Copied from CRM [id number]. Topic: Clinical - Medication Question >> Dec 31, 2023 11:40 AM [name] wrote: Reason for CRM: patient is calling abut the price of Urelle  (URELLE /URISED) 81 MG TABS tablet is $1400 and she would like to have  one that is covered by her insurance. She would like to have someone call her back. She states it will take a while to get to the phone.

## 2023-12-31 NOTE — Telephone Encounter (Signed)
 That medication was prescribed by Vonn Inch her OBGYN. That medication is in a unique class of it's own and is never covered by insurance. I have attached her prescription formulary for covered urinary antispasmodics for Dr. Tobie to review if he would like to prescribed something for her. Please let me know if I can be of further assistance.

## 2024-01-01 ENCOUNTER — Other Ambulatory Visit: Payer: Self-pay | Admitting: Internal Medicine

## 2024-01-01 ENCOUNTER — Ambulatory Visit: Payer: Self-pay

## 2024-01-01 DIAGNOSIS — N3000 Acute cystitis without hematuria: Secondary | ICD-10-CM

## 2024-01-01 DIAGNOSIS — N3941 Urge incontinence: Secondary | ICD-10-CM

## 2024-01-01 MED ORDER — SULFAMETHOXAZOLE-TRIMETHOPRIM 800-160 MG PO TABS: 1.0000 | ORAL_TABLET | Freq: Two times a day (BID) | ORAL | 0 refills | Status: DC

## 2024-01-01 NOTE — Addendum Note (Signed)
 Addended byBETHA TOBIE DOWNS on: 01/01/2024 12:09 PM   Modules accepted: Orders

## 2024-01-01 NOTE — Telephone Encounter (Signed)
 FYI Only or Action Required?: Action required by provider: Patient requesting medication---transferred to PCP office staff Philippe for further assistance.  Patient was last seen in primary care on 11/04/2023 by Jessica Suzzane POUR, MD.  Called Nurse Triage reporting Request for Medication.  Triage Disposition: No disposition on file.  Patient/caregiver understands and will follow disposition?: No, wishes to speak with PCP  Patient transferred to [facility] at PCP office for further assistance.          Copied from CRM #[id number]. Topic: Clinical - Red Word Triage >> Jan 01, 2024  9:30 AM Delon HERO wrote: Red Word that prompted transfer to Nurse Triage: Patient is calling to report ' terrible pain that started a couple of days ago that is on going as a result from her cancer. Patient is reporting that the AZO is not helping. Reporting that she does not have car. Needing assistance to get medication picked up from the pharmacy Reason for Disposition  [1] Caller requesting NON-URGENT health information AND [2] PCP's office is the best resource  Answer Assessment - Initial Assessment Questions Patient called and was very frustrated and upset. She states that she called yesterday and nobody returned her phone call This RN gave her the message via her PCP Dr Jessica verbatim:  Jessica Suzzane POUR, MD to [facility] Clinical     12/31/23  5:24 PM It was prescribed by Dr. Jayne. Please advise her to contact Ob. Gyn. office for alternative. Thank you.  Patient was upset that nobody called her yesterday to give her that message. This RN explained that it was after 5pm. Patient also advised that she didn't have an OB GYN and that Dr Jessica had prescribed her this medication for her urinary issues and that AZO does not help at all. She did not remember the name of the medication that she was requesting from Dr Jessica but was very upset about possibly going all weekend without a resolution. Patient  was also upset with not speaking directly with someone in the office so a message could be given to her PCP physically. This RN called [facility] for assistance with patient at this time to try and provide resolution to her concerns. Patient was warm transferred to [facility] at PCP office for further assistance.  Protocols used: Information Only Call - No Triage-A-AH

## 2024-01-20 ENCOUNTER — Telehealth: Payer: Self-pay

## 2024-01-20 NOTE — Telephone Encounter (Signed)
 Copied from CRM #[id number]. Topic: General - Other >> Jan 20, 2024  1:59 PM Roselie BROCKS wrote: Reason for CRM: Patient needs  a return call about her medications and appointments concerning her cancer history.

## 2024-01-21 ENCOUNTER — Other Ambulatory Visit: Payer: Self-pay | Admitting: Internal Medicine

## 2024-01-21 DIAGNOSIS — N3020 Other chronic cystitis without hematuria: Secondary | ICD-10-CM

## 2024-01-21 DIAGNOSIS — N3941 Urge incontinence: Secondary | ICD-10-CM

## 2024-01-21 NOTE — Telephone Encounter (Signed)
Pt informed

## 2024-01-24 ENCOUNTER — Other Ambulatory Visit: Payer: Self-pay | Admitting: Internal Medicine

## 2024-01-24 DIAGNOSIS — I10 Essential (primary) hypertension: Secondary | ICD-10-CM

## 2024-01-24 DIAGNOSIS — E1149 Type 2 diabetes mellitus with other diabetic neurological complication: Secondary | ICD-10-CM

## 2024-01-24 DIAGNOSIS — E1169 Type 2 diabetes mellitus with other specified complication: Secondary | ICD-10-CM

## 2024-01-26 ENCOUNTER — Encounter: Payer: Self-pay | Admitting: Internal Medicine

## 2024-01-26 ENCOUNTER — Ambulatory Visit (INDEPENDENT_AMBULATORY_CARE_PROVIDER_SITE_OTHER): Admitting: Internal Medicine

## 2024-01-26 VITALS — BP 138/73 | HR 72 | Ht 64.5 in | Wt 136.6 lb

## 2024-01-26 DIAGNOSIS — Z0001 Encounter for general adult medical examination with abnormal findings: Secondary | ICD-10-CM

## 2024-01-26 DIAGNOSIS — E785 Hyperlipidemia, unspecified: Secondary | ICD-10-CM

## 2024-01-26 DIAGNOSIS — E1169 Type 2 diabetes mellitus with other specified complication: Secondary | ICD-10-CM | POA: Diagnosis not present

## 2024-01-26 DIAGNOSIS — Z23 Encounter for immunization: Secondary | ICD-10-CM | POA: Diagnosis not present

## 2024-01-26 DIAGNOSIS — N3020 Other chronic cystitis without hematuria: Secondary | ICD-10-CM

## 2024-01-26 DIAGNOSIS — I10 Essential (primary) hypertension: Secondary | ICD-10-CM | POA: Diagnosis not present

## 2024-01-26 DIAGNOSIS — E538 Deficiency of other specified B group vitamins: Secondary | ICD-10-CM

## 2024-01-26 DIAGNOSIS — G7000 Myasthenia gravis without (acute) exacerbation: Secondary | ICD-10-CM | POA: Diagnosis not present

## 2024-01-26 DIAGNOSIS — F339 Major depressive disorder, recurrent, unspecified: Secondary | ICD-10-CM

## 2024-01-26 MED ORDER — PHENAZOPYRIDINE HCL 100 MG PO TABS: 100.0000 mg | ORAL_TABLET | Freq: Three times a day (TID) | ORAL | 5 refills | Status: DC | PRN

## 2024-01-26 MED ORDER — CYANOCOBALAMIN 1000 MCG/ML IJ SOLN
1000.0000 ug | Freq: Once | INTRAMUSCULAR | Status: AC
  Administered 2024-01-26: 1000 ug via INTRAMUSCULAR

## 2024-01-26 NOTE — Assessment & Plan Note (Signed)
 Pyridium  as needed for chronic dysuria Recently completed Bactrim  for UTI Had referred to urology - needs cystoscopy

## 2024-01-26 NOTE — Progress Notes (Signed)
 Established Patient Office Visit  Subjective:  Patient ID: Jessica Mclean, female    DOB: 23-Jan-1946  Age: 78 y.o. MRN: [id number]  CC:  Chief Complaint  Patient presents with   Diabetes    4 month f/u    Hypertension    4 month f/u     HPI Jessica Mclean is a 78 y.o. female with past medical history of HTN, DM2, TIA, HLD and cervical ca. s/p radiation who presents for f/u of her chronic medical conditions.  Interval summary: She has started taking her medications regularly now. Eats at regular intervals. Her weight has increased to 136 lbs now, has gained about 8 lbs since the last visit. Has had near falls at home, but less frequent, but no major injury including head injury.  Type II DM: She is taking Metformin  1000 mg BID and Ozempic  0.5 mg qw now. She has [employer] as she was able to use it better in the past, but still has difficulty connecting it. She has not checked blood glucose with regular fingerstick recently.  Of note, she has been taking prednisone  5 mg daily for myasthenia gravis.  HTN: BP is well-controlled. Takes medications regularly. Patient denies headache, chest pain, or palpitations.   Myasthenia gravis: Followed by neurology.  She is on Mestinon , Azathioprine  and oral prednisone  currently. She had hospitalization in 11/24 for severe weakness, but was thought to be due to B12 deficiency rather than myasthenia gravis crisis. She still has weakness and has gait disturbance, but has improved with B12 supplement. She has noticed improvement in visual disturbance and speech difficulty with myasthenia gravis treatment. She has history of CVA in the past.  Denies any new focal neurologic deficit. She has chronic weakness of the b/l LE.  Chronic cystitis: She has been taking Azo for dysuria. Denies any hematuria, flank or pelvic pain currently. She is awaiting urology evaluation for recurrent bladder pain.  MDD: She has felt better recently.  She was still feeling  depressed since her daughter moved to [place] .  She has been taking duloxetine  30 mg QD, which has improved her symptoms.  Her crying spells and anxiety symptoms have improved compared to previous visit.  Denies SI or HI currently.    Past Medical History:  Diagnosis Date   Bleeding disorder (HCC)    Cervical cancer (HCC) 2010   Diabetes mellitus without complication (HCC)    type 2   Diabetes mellitus without complication (HCC)    Heart murmur    High cholesterol    HLD (hyperlipidemia)    Hypertension    Mental disorder    depression; suicidal ideation in 2017   Sleep apnea    Vitamin D  deficiency     Past Surgical History:  Procedure Laterality Date   BILATERAL OOPHORECTOMY     CESAREAN SECTION     x 4   CHOLECYSTECTOMY     COLOSTOMY     x2   CYSTOSCOPY WITH URETHRAL DILATATION N/A 11/15/2020   Procedure: CYSTOSCOPY WITH URETHRAL DILATION;  Surgeon: Sherrilee Belvie CROME, MD;  Location: [facility];  Service: Urology;  Laterality: N/A;   SALPINGECTOMY     TONSILLECTOMY     TONSILLECTOMY AND ADENOIDECTOMY      Family History  Problem Relation Age of Onset   Cancer Father        kidney   Cancer Mother        lung   Other Brother        heart issues  Social History   Socioeconomic History   Marital status: Divorced    Spouse name: Not on file   Number of children: 3   Years of education: Not on file   Highest education level: Not on file  Occupational History   Occupation: retired  Tobacco Use   Smoking status: Never   Smokeless tobacco: Never  Vaping Use   Vaping status: Never Used  Substance and Sexual Activity   Alcohol use: Never   Drug use: Never   Sexual activity: Not Currently    Birth control/protection: Post-menopausal  Other Topics Concern   Not on file  Social History Narrative   ** Merged History Encounter **    Right handed   Leave alone-1 level home   Caffeine--soda/coffee    What is your current occupation? retired   Do you live at  home alone?yes             Social Drivers of Corporate investment banker Strain: High Risk (10/15/2023)   Overall Financial Resource Strain (CARDIA)    Difficulty of Paying Living Expenses: Very hard  Food Insecurity: Food Insecurity Present (10/15/2023)   Hunger Vital Sign    Worried About Running Out of Food in the Last Year: Never true    Ran Out of Food in the Last Year: Sometimes true  Transportation Needs: Unmet Transportation Needs (10/15/2023)   PRAPARE - Administrator, Civil Service (Medical): Yes    Lack of Transportation (Non-Medical): No  Physical Activity: Insufficiently Active (10/15/2023)   Exercise Vital Sign    Days of Exercise per Week: 1 day    Minutes of Exercise per Session: 20 min  Stress: Stress Concern Present (10/15/2023)   [employer] of Occupational Health - Occupational Stress Questionnaire    Feeling of Stress : To some extent  Social Connections: Socially Isolated (10/15/2023)   Social Connection and Isolation Panel    Frequency of Communication with Friends and Family: More than three times a week    Frequency of Social Gatherings with Friends and Family: Never    Attends Religious Services: Never    Database administrator or Organizations: No    Attends Banker Meetings: Never    Marital Status: Divorced  Catering manager Violence: Not At Risk (10/15/2023)   Humiliation, Afraid, Rape, and Kick questionnaire    Fear of Current or Ex-Partner: No    Emotionally Abused: No    Physically Abused: No    Sexually Abused: No    Outpatient Medications Prior to Visit  Medication Sig Dispense Refill   ACCU-CHEK GUIDE test strip USE TO CHECK BLOOD SUGAR 4 TIMES DAILY 400 strip 2   Accu-Chek Softclix Lancets lancets CHECK BLOOD SUGAR 4 TIMES DAILY 400 each 2   albuterol  (VENTOLIN  HFA) 108 (90 Base) MCG/ACT inhaler Inhale 2 puffs into the lungs every 6 (six) hours as needed for wheezing or shortness of breath. 18 g 1   amLODipine   (NORVASC ) 10 MG tablet TAKE 1 TABLET BY MOUTH DAILY 100 tablet 2   aspirin -acetaminophen -caffeine (EXCEDRIN EXTRA STRENGTH) 250-250-65 MG tablet Take 2 tablets by mouth in the morning.     atorvastatin  (LIPITOR) 80 MG tablet TAKE 1 TABLET BY MOUTH DAILY 100 tablet 2   azaTHIOprine  (IMURAN ) 50 MG tablet Take 2 tablets (100 mg total) by mouth daily. Take 1 tablet (50 mg) for 1 week, then increase to 2 tablets (100 mg) thereafter. 60 tablet 11   blood glucose meter  kit and supplies Dispense based on patient and insurance preference. Use up to four times daily as directed. (FOR ICD-10 E10.9, E11.9). 1 each 0   Blood Glucose Monitoring Suppl (ACCU-CHEK GUIDE) w/Device KIT      Blood Glucose Monitoring Suppl DEVI 1 each by Does not apply route in the morning, at noon, and at bedtime. Patient's insurance prefers Accu-Check, please substitute to this brand. DX E11.65 1 each 0   calcium -vitamin D  (OSCAL-500) 500-400 MG-UNIT tablet Take 1 tablet by mouth daily.     Coenzyme Q10 (COQ-10 PO) Take by mouth.     Collagen-Vitamin C-Biotin (COLLAGEN PO) Take by mouth.     Continuous Glucose Receiver (FREESTYLE LIBRE 3 READER) DEVI Use it to check blood glucose as instructed. 1 each 0   Continuous Glucose Sensor (FREESTYLE LIBRE 3 SENSOR) MISC Use it to check blood glucose as instructed. 2 each 5   DULoxetine  (CYMBALTA ) 30 MG capsule Take 1 capsule (30 mg total) by mouth daily. 90 capsule 1   FLAXSEED, LINSEED, PO Take by mouth.     KRILL OIL PO Take by mouth.     meclizine  (ANTIVERT ) 25 MG tablet Take 1 tablet (25 mg total) by mouth 3 (three) times daily as needed for dizziness. 30 tablet 1   Melatonin-Pyridoxine (MELATIN PO) Take 10 mg by mouth.     metFORMIN  (GLUCOPHAGE ) 1000 MG tablet TAKE 1 TABLET BY MOUTH TWICE  DAILY WITH MEALS 200 tablet 2   Multiple Vitamins-Minerals (PRESERVISION AREDS PO) Take 2 capsules by mouth daily.     omega-3 acid ethyl esters (LOVAZA ) 1 g capsule TAKE 2 CAPSULES BY MOUTH TWICE   DAILY 180 capsule 7   predniSONE  (DELTASONE ) 2.5 MG tablet Take 2 tablets (5 mg total) by mouth daily with breakfast. 60 tablet 11   pyridostigmine  (MESTINON ) 60 MG tablet TAKE 1 TABLET BY MOUTH 3 TIMES  DAILY 100 tablet 9   Semaglutide ,0.25 or 0.5MG /DOS, (OZEMPIC , 0.25 OR 0.5 MG/DOSE,) 2 MG/3ML SOPN Inject 0.5 mg into the skin every 7 (seven) days. 9 mL 3   telmisartan  (MICARDIS ) 40 MG tablet TAKE 1 TABLET BY MOUTH DAILY 100 tablet 2   traMADol  (ULTRAM ) 50 MG tablet Take 1 tablet (50 mg total) by mouth every 12 (twelve) hours as needed. 20 tablet 0   vitamin B-12 (CYANOCOBALAMIN ) 100 MCG tablet Take 1 tablet (100 mcg total) by mouth daily. 90 tablet 3   phenazopyridine  (PYRIDIUM ) 100 MG tablet Take 1 tablet (100 mg total) by mouth 3 (three) times daily as needed for pain (Dysuria). 90 tablet 5   sulfamethoxazole -trimethoprim  (BACTRIM  DS) 800-160 MG tablet Take 1 tablet by mouth 2 (two) times daily. 10 tablet 0   Urelle  (URELLE /URISED) 81 MG TABS tablet Take 1 tablet (81 mg total) by mouth 4 (four) times daily. 120 tablet 11   No facility-administered medications prior to visit.    Allergies  Allergen Reactions   Other     Nickel staples   Poison Ivy Treatments     Poison Ivy     ROS Review of Systems  Constitutional:  Positive for fatigue. Negative for chills and fever.  HENT:  Positive for voice change. Negative for congestion, sinus pressure, sinus pain and sore throat.   Eyes:  Negative for pain and discharge.  Respiratory:  Negative for cough and shortness of breath.   Cardiovascular:  Negative for chest pain and palpitations.  Gastrointestinal:  Negative for blood in stool, nausea and vomiting.  Endocrine: Negative for polydipsia  and polyuria.  Genitourinary:  Positive for urgency. Negative for dysuria, flank pain, frequency and hematuria.  Musculoskeletal:  Negative for neck pain and neck stiffness.  Skin:  Negative for rash.  Neurological:  Positive for speech difficulty and  weakness.  Psychiatric/Behavioral:  Positive for dysphoric mood and sleep disturbance. Negative for agitation and behavioral problems. The patient is nervous/anxious.       Objective:    Physical Exam Vitals reviewed.  Constitutional:      General: She is not in acute distress.    Appearance: She is not diaphoretic.  HENT:     Head: Normocephalic and atraumatic.     Nose: Nose normal.     Mouth/Throat:     Mouth: Mucous membranes are moist.  Eyes:     General: No scleral icterus.    Extraocular Movements: Extraocular movements intact.  Cardiovascular:     Rate and Rhythm: Normal rate and regular rhythm.     Heart sounds: Normal heart sounds. No murmur heard. Pulmonary:     Breath sounds: No wheezing or rales.  Abdominal:     Palpations: Abdomen is soft.     Tenderness: There is abdominal tenderness (Chronic, suprapubic).  Musculoskeletal:     Cervical back: Neck supple. No tenderness.     Right lower leg: No edema.     Left lower leg: No edema.  Feet:     Right foot:     Toenail Condition: Right toenails are abnormally thick.     Left foot:     Toenail Condition: Left toenails are abnormally thick.  Skin:    General: Skin is warm.     Findings: No rash.  Neurological:     General: No focal deficit present.     Mental Status: She is alert and oriented to person, place, and time.     Sensory: Sensory deficit (B/l feet) present.     Motor: Weakness (B/l UE and LE - 4/5) present.     Gait: Gait abnormal.     Comments: Slurred speech  Psychiatric:        Mood and Affect: Mood normal.        Behavior: Behavior normal.     BP 138/73   Pulse 72   Ht 5' 4.5 (1.638 m)   Wt 136 lb 9.6 oz (62 kg)   LMP  (LMP Unknown)   SpO2 97%   BMI 23.09 kg/m  Wt Readings from Last 3 Encounters:  01/26/24 136 lb 9.6 oz (62 kg)  11/06/23 129 lb (58.5 kg)  10/15/23 125 lb (56.7 kg)    Lab Results  Component Value Date   TSH 0.484 04/08/2023   Lab Results  Component Value  Date   WBC 7.1 10/10/2023   HGB 9.3 (L) 10/10/2023   HCT 29.7 (L) 10/10/2023   MCV 90.3 10/10/2023   PLT 286 10/10/2023   Lab Results  Component Value Date   NA 136 10/10/2023   K 4.2 10/10/2023   CO2 23 10/10/2023   GLUCOSE 130 (H) 10/10/2023   BUN 16 10/10/2023   CREATININE 0.87 10/10/2023   BILITOT 0.4 10/10/2023   ALKPHOS 59 10/10/2023   AST 24 10/10/2023   ALT 12 10/10/2023   PROT 6.8 10/10/2023   ALBUMIN 4.4 10/10/2023   CALCIUM  9.4 10/10/2023   ANIONGAP 10 10/10/2023   EGFR 54 (L) 09/24/2023   Lab Results  Component Value Date   CHOL 185 09/17/2022   Lab Results  Component Value Date  HDL 52 09/17/2022   Lab Results  Component Value Date   LDLCALC 105 (H) 09/17/2022   Lab Results  Component Value Date   TRIG 159 (H) 09/17/2022   Lab Results  Component Value Date   CHOLHDL 3.6 09/17/2022   Lab Results  Component Value Date   HGBA1C 6.2 (H) 09/24/2023      Assessment & Plan:   Problem List Items Addressed This Visit       Cardiovascular and Mediastinum   Essential hypertension   BP Readings from Last 1 Encounters:  01/26/24 138/73   Well-controlled with telmisartan  40 mg daily and amlodipine  10 mg daily now Counseled for compliance with the medications Advised DASH diet and moderate exercise/walking as tolerated        Endocrine   Type 2 diabetes mellitus with hyperlipidemia (HCC)   Lab Results  Component Value Date   HGBA1C 6.2 (H) 09/24/2023   Well-controlled Continue dose of Ozempic  0.5 mg qw for now, would be cautious due to weight loss in the past Since she is on oral prednisone  now, continue metformin  1000 mg twice daily Has CGM to monitor for hypoglycemia - Freestyle libre, but needs nursing guidance again to connect to her device, advised to bring the device to office Advised to follow diabetic diet On ARB and statin Diabetic eye exam: Advised to follow up with Ophthalmology for diabetic eye exam      Relevant Orders    CMP14+EGFR   Hemoglobin A1c     Nervous and Auditory   Myasthenia gravis without acute exacerbation (HCC)   Followed by neurology S/p IVIG treatment On Mestinon , Azathioprine  and oral prednisone  - symptoms had improved initially Her current weakness is due to nutritional deficiencies and myasthenia gravis -symptoms improved since restarting oral prednisone  Gets B12 injections      Relevant Orders   CBC with Differential/Platelet   CMP14+EGFR     Genitourinary   Chronic cystitis   Pyridium  as needed for chronic dysuria Recently completed Bactrim  for UTI Had referred to urology - needs cystoscopy      Relevant Medications   phenazopyridine  (PYRIDIUM ) 100 MG tablet     Other   Depression, recurrent (HCC) (Chronic)   Better controlled, but improving Her daughter recently moved to [place], which has caused adjustment concern versus recurrence of depression symptoms On Cymbalta  30 mg QD      Encounter for general adult medical examination with abnormal findings - Primary   Physical exam as documented. Fasting blood tests today. Flu vaccine today.      B12 deficiency   Vit B12: 153 Vit B12 injection today Advised to take vitamin B12 1000 mcg QD      Other Visit Diagnoses       Encounter for immunization       Relevant Orders   Flu vaccine HIGH DOSE PF(Fluzone Trivalent) (Completed)        Meds ordered this encounter  Medications   phenazopyridine  (PYRIDIUM ) 100 MG tablet    Sig: Take 1 tablet (100 mg total) by mouth 3 (three) times daily as needed for pain (Dysuria).    Dispense:  90 tablet    Refill:  5   cyanocobalamin  (VITAMIN B12) injection 1,000 mcg    Follow-up: Return in about 4 months (around 05/27/2024) for DM.    Suzzane MARLA Blanch, MD

## 2024-01-26 NOTE — Assessment & Plan Note (Signed)
 Followed by neurology S/p IVIG treatment On Mestinon , Azathioprine  and oral prednisone  - symptoms had improved initially Her current weakness is due to nutritional deficiencies and myasthenia gravis -symptoms improved since restarting oral prednisone  Gets B12 injections

## 2024-01-26 NOTE — Patient Instructions (Signed)
 Please take Pyridium  as needed for bladder pain. Please follow up with Urology.  Please continue to take medications as prescribed.  Please continue to follow low carb diet and ambulate as tolerated.

## 2024-01-26 NOTE — Assessment & Plan Note (Signed)
Physical exam as documented. Fasting blood tests today. Flu vaccine today. 

## 2024-01-26 NOTE — Assessment & Plan Note (Signed)
 Vit B12: 153 Vit B12 injection today Advised to take vitamin B12 1000 mcg QD

## 2024-01-26 NOTE — Assessment & Plan Note (Signed)
 BP Readings from Last 1 Encounters:  01/26/24 138/73   Well-controlled with telmisartan  40 mg daily and amlodipine  10 mg daily now Counseled for compliance with the medications Advised DASH diet and moderate exercise/walking as tolerated

## 2024-01-26 NOTE — Assessment & Plan Note (Signed)
 Lab Results  Component Value Date   HGBA1C 6.2 (H) 09/24/2023   Well-controlled Continue dose of Ozempic  0.5 mg qw for now, would be cautious due to weight loss in the past Since she is on oral prednisone  now, continue metformin  1000 mg twice daily Has CGM to monitor for hypoglycemia - Freestyle libre, but needs nursing guidance again to connect to her device, advised to bring the device to office Advised to follow diabetic diet On ARB and statin Diabetic eye exam: Advised to follow up with Ophthalmology for diabetic eye exam

## 2024-01-26 NOTE — Assessment & Plan Note (Signed)
 Better controlled, but improving Her daughter recently moved to [place], which has caused adjustment concern versus recurrence of depression symptoms On Cymbalta  30 mg QD

## 2024-01-27 ENCOUNTER — Ambulatory Visit: Payer: Self-pay | Admitting: Internal Medicine

## 2024-01-27 ENCOUNTER — Other Ambulatory Visit: Payer: Self-pay | Admitting: Internal Medicine

## 2024-01-27 ENCOUNTER — Ambulatory Visit: Payer: Self-pay

## 2024-01-27 ENCOUNTER — Telehealth: Payer: Self-pay

## 2024-01-27 DIAGNOSIS — D509 Iron deficiency anemia, unspecified: Secondary | ICD-10-CM

## 2024-01-27 LAB — CBC WITH DIFFERENTIAL/PLATELET
Basophils Absolute: 0.1 x10E3/uL (ref 0.0–0.2)
Basos: 1 %
EOS (ABSOLUTE): 0.1 x10E3/uL (ref 0.0–0.4)
Eos: 1 %
Hematocrit: 28.2 % — ABNORMAL LOW (ref 34.0–46.6)
Hemoglobin: 8.7 g/dL — ABNORMAL LOW (ref 11.1–15.9)
Immature Grans (Abs): 0 x10E3/uL (ref 0.0–0.1)
Immature Granulocytes: 0 %
Lymphocytes Absolute: 1.2 x10E3/uL (ref 0.7–3.1)
Lymphs: 19 %
MCH: 27.4 pg (ref 26.6–33.0)
MCHC: 30.9 g/dL — ABNORMAL LOW (ref 31.5–35.7)
MCV: 89 fL (ref 79–97)
Monocytes Absolute: 0.5 x10E3/uL (ref 0.1–0.9)
Monocytes: 8 %
Neutrophils Absolute: 4.6 x10E3/uL (ref 1.4–7.0)
Neutrophils: 71 %
Platelets: 292 x10E3/uL (ref 150–450)
RBC: 3.18 x10E6/uL — ABNORMAL LOW (ref 3.77–5.28)
RDW: 17.6 % — ABNORMAL HIGH (ref 11.7–15.4)
WBC: 6.4 x10E3/uL (ref 3.4–10.8)

## 2024-01-27 LAB — CMP14+EGFR
ALT: 8 IU/L (ref 0–32)
AST: 16 IU/L (ref 0–40)
Albumin: 4.5 g/dL (ref 3.8–4.8)
Alkaline Phosphatase: 80 IU/L (ref 44–121)
BUN/Creatinine Ratio: 17 (ref 12–28)
BUN: 20 mg/dL (ref 8–27)
Bilirubin Total: <0.2 mg/dL (ref 0.0–1.2)
CO2: 19 mmol/L — ABNORMAL LOW (ref 20–29)
Calcium: 9.9 mg/dL (ref 8.7–10.3)
Chloride: 103 mmol/L (ref 96–106)
Creatinine, Ser: 1.21 mg/dL — ABNORMAL HIGH (ref 0.57–1.00)
Globulin, Total: 1.9 g/dL (ref 1.5–4.5)
Glucose: 103 mg/dL — ABNORMAL HIGH (ref 70–99)
Potassium: 4.7 mmol/L (ref 3.5–5.2)
Sodium: 141 mmol/L (ref 134–144)
Total Protein: 6.4 g/dL (ref 6.0–8.5)
eGFR: 46 mL/min/1.73 — ABNORMAL LOW (ref >59)

## 2024-01-27 LAB — HEMOGLOBIN A1C
Est. average glucose Bld gHb Est-mCnc: 114 mg/dL
Hgb A1c MFr Bld: 5.6 % (ref 4.8–5.6)

## 2024-01-27 NOTE — Telephone Encounter (Signed)
 No triage- RN went over labs. Pt is requesting labs be mailed to home. Pt had no  further questions or concerns.

## 2024-01-27 NOTE — Telephone Encounter (Signed)
 Copied from CRM #[id number]. Topic: Clinical - Lab/Test Results >> Jan 27, 2024  8:57 AM [name] wrote: Reason for CRM: Patient is returning call about lab results, she missed the call from [place]. Please contact the patient

## 2024-01-27 NOTE — Telephone Encounter (Signed)
 Second attempt to contact pt , if she calls back okay for e2c2 to go over labs.

## 2024-01-28 ENCOUNTER — Telehealth: Payer: Self-pay

## 2024-01-28 MED ORDER — PHENAZOPYRIDINE HCL 100 MG PO TABS: 100.0000 mg | ORAL_TABLET | Freq: Three times a day (TID) | ORAL | 5 refills | Status: DC | PRN

## 2024-01-28 NOTE — Addendum Note (Signed)
 Addended byBETHA TOBIE DOWNS on: 01/28/2024 04:10 PM   Modules accepted: Orders

## 2024-01-28 NOTE — Telephone Encounter (Signed)
 Copied from CRM [id number]. Topic: Clinical - Prescription Issue >> Jan 28, 2024  3:48 PM Mia F wrote: Reason for CRM: A rx for phenazopyridine  (PYRIDIUM ) 100 MG tablet was sent to [facility] in [place] and pt need the rx sent to [facility] [place] [address] [place] [address] Phone: [phone] Fax: [phone]  She uses this pharmacy as it delivers and she does not have a way of getting to the [facility]. She says she need the medication before the weekend.

## 2024-02-16 ENCOUNTER — Other Ambulatory Visit: Payer: Self-pay | Admitting: Internal Medicine

## 2024-02-16 DIAGNOSIS — R42 Dizziness and giddiness: Secondary | ICD-10-CM

## 2024-02-16 NOTE — Telephone Encounter (Unsigned)
 Copied from CRM [id number]. Topic: Clinical - Medication Refill >> Feb 16, 2024  5:00 PM DeAngela L wrote: Medication: meclizine  (ANTIVERT ) 25 MG tablet  Has the patient contacted their pharmacy? No  (Agent: If no, request that the patient contact the pharmacy for the refill. If patient does not wish to contact the pharmacy document the reason why and proceed with request.) (Agent: If yes, when and what did the pharmacy advise?)  This is the patient's preferred pharmacy: patient  would like the [facility] in [place] she also gets her grocery delivered from the is location  [facility] [address] [place] [address] Phone: [phone] Fax:  Is this the correct pharmacy for this prescription? Yes  If no, delete pharmacy and type the correct one.   Has the prescription been filled recently? Yes   Is the patient out of the medication? No   Has the patient been seen for an appointment in the last year OR does the patient have an upcoming appointment? Yes  Can we respond through MyChart? No   Agent: Please be advised that Rx refills may take up to 3 business days. We ask that you follow-up with your pharmacy.

## 2024-02-17 ENCOUNTER — Telehealth: Payer: Self-pay

## 2024-02-17 ENCOUNTER — Other Ambulatory Visit: Payer: Self-pay

## 2024-02-17 DIAGNOSIS — R42 Dizziness and giddiness: Secondary | ICD-10-CM

## 2024-02-17 MED ORDER — MECLIZINE HCL 25 MG PO TABS: 25.0000 mg | ORAL_TABLET | Freq: Three times a day (TID) | ORAL | 1 refills | Status: AC | PRN

## 2024-02-17 NOTE — Telephone Encounter (Signed)
 Copied from CRM [id number]. Topic: Clinical - Red Word Triage >> Feb 16, 2024  4:54 PM Jessica Mclean wrote: Red Word that prompted transfer to Nurse Triage: patient has passed out today fell and she has a lump on her forehead and she is very dizzy, and she says she is having difficulty was and also needs a medication refill   Patient declined NT call she says Dr Tobie is aware   Pt num [phone] (M)  meclizine  (ANTIVERT ) 25 MG tablet  [facility] in [place] is where the patient would like a refill

## 2024-02-18 ENCOUNTER — Telehealth: Payer: Self-pay | Admitting: Neurology

## 2024-02-18 NOTE — Telephone Encounter (Signed)
 Pt. Lefted a message stating she wants to speak to the doctor about potential Dementia. Thanks

## 2024-02-19 NOTE — Telephone Encounter (Signed)
 Called and talked to patient 02/18/24 she did not remember calling about Dementia. She did report that she was not out of medicine, and that she has been doing better with her balance. She also reported that she has no more ride shares for the year so she is not sure she will be able to come to the next appointment. I asked if she could talk to her daughter to see if she could help and she said she would. I told her to let us  know and to call for any questions or concerns.

## 2024-02-26 ENCOUNTER — Telehealth: Payer: Self-pay

## 2024-02-26 ENCOUNTER — Other Ambulatory Visit: Payer: Self-pay | Admitting: Internal Medicine

## 2024-02-26 DIAGNOSIS — N3020 Other chronic cystitis without hematuria: Secondary | ICD-10-CM

## 2024-02-26 MED ORDER — PHENAZOPYRIDINE HCL 100 MG PO TABS: 100.0000 mg | ORAL_TABLET | Freq: Three times a day (TID) | ORAL | 5 refills | Status: DC | PRN

## 2024-02-26 NOTE — Telephone Encounter (Signed)
 Copied from CRM [id number]. Topic: Clinical - Prescription Issue >> Feb 26, 2024 10:25 AM [name] wrote: Reason for CRM: the patient is stating that she need uti medication sent to different  [facility]acy bc [facility]. She is wants it to go to [facility] - [place], [place] - [phone] [place] [address] Phone: [phone] Fax: [phone] Hours: Not open 24 hours

## 2024-02-29 ENCOUNTER — Other Ambulatory Visit: Payer: Self-pay | Admitting: Internal Medicine

## 2024-02-29 DIAGNOSIS — R42 Dizziness and giddiness: Secondary | ICD-10-CM

## 2024-02-29 NOTE — Telephone Encounter (Signed)
 Copied from CRM [id number]. Topic: Clinical - Medication Refill >> Feb 29, 2024  1:46 PM Avram G wrote: Medication: meclizine  (ANTIVERT ) 25 MG tablet [[id number]]   Has the patient contacted their pharmacy? Yes (Agent: If no, request that the patient contact the pharmacy for the refill. If patient does not wish to contact the pharmacy document the reason why and proceed with request.) (Agent: If yes, when and what did the pharmacy advise?)  This is the patient's preferred pharmacy:  [facility] - [place], [place] - [address] [place] [address] Phone: [phone] Fax: [phone]   Is this the correct pharmacy for this prescription? Yes If no, delete pharmacy and type the correct one.   Has the prescription been filled recently? Yes  Is the patient out of the medication? Yes  Has the patient been seen for an appointment in the last year OR does the patient have an upcoming appointment? Yes  Can we respond through MyChart? No  Agent: Please be advised that Rx refills may take up to 3 business days. We ask that you follow-up with your pharmacy.

## 2024-03-09 NOTE — Progress Notes (Deleted)
 I saw Jessica Mclean in neurology clinic on 03/17/24 in follow up for AChR ab positive, generalized myasthenia gravis.  HPI: Jessica Mclean is a 78 y.o. year old female with a history of HTN, IDDM, HLD, cervical cancer s/p radiation, TIA, depression, vit D deficiency, sleep apnea who we last saw on 11/06/23.  To briefly review: Initial consultation (11/06/22): Patient woke up over a year ago and had difficulty lifting her head and had vertigo. She called her daughter who is a doctor, who insisted patient go to the hospital. She also had dysarthria. This was first thing in the morning. She had the COVID vaccine the day before, so symptoms were attributed to that. The vertigo resolved. She continues to have falls. She is not sure why. She had an MRI finally in 09/2022 (about 1 month ago) that did not show evidence of a stroke. Over the last year, she has noticed symptoms are worse with fatigue, end of day, and with use. PCP checked AChR blocking ab and was elevated to 52.    Patient has stopped taking all of her medications due to concerns of what would make myasthenia worse.   Current MG symptoms: Ptosis: Yes, especially while watching TV Double vision: Yes Speech: Yes, worse when talking more and at end of day Chewing: Yes Swallowing: Yes, choking on solids. Liquids tend to dribble out of mouth. Breathing: Denies orthopnea Arm strength: No issues Leg strength: Does not think she is weak, but does endorse falls.   She is not on any medication for myasthenia gravis.   Of note patient takes gabapentin  100 mg qhs for diabetic neuropathy. This helps.   Patient is currently going to speech therapy. She is not sure she will continue due to costly co-pay.   She does not report any constitutional symptoms like fever, night sweats, anorexia or unintentional weight loss.   EtOH use: None  Restrictive diet? Vegan, not on B12. She has been eating much less lately, mostly bread and tomatoes Family  history of neuropathy/myopathy/neurologic disease? No   11/19/22: She got IVIg at home from 11/14/22-11/18/22. She had significant fatigue during the treatment.   Current medications:  -Prednisone  20 mg daily -Mestinon  60 mg TID S/p IVIg 0.4 g/kg/day for 5 days (11/14/22-11/18/22)   12/26/22: AChR abs were again positive, but lab work was otherwise unremarkable, including TPMT activity. CT chest showed no evidence of thymoma. Patient thinks the IVIg is really helping. She take take pills and can swallow much better.   She received her monthly maintenance of IVIg on 12/14/22.    Current medications:  -Prednisone  20 mg daily (has only been taking once daily) -Mestinon  60 mg TID   Side effects: Occasional diarrhea   05/12/23: Patient still feels very weak. She will be walking and banging into walls. She has had 1 fall since last visit. She states she is trying to eat more. She has occasional left sided chest pain.    She feels shaky in her hands. She does not write well.    Patient had IVIg on 01/19/23, 02/18/23, 03/27/23, and 04/28/23 (not scheduled for more at this point). She has been tolerating this well and happy with her home infusion.   Patient was hospitalized from 04/08/23-04/10/23 for two weeks of unsteady gait and generalized weakness. She had an unwitnessed syncopal episode on 04/08/23. CT head showed no acute process. B12 was found to be low, which was thought to be due to poor eating habits. MG exacerbation was felt to  be less likely as patient had just gotten IVIg recently and did not have fatigable weakness. Patient was given IM B12 1000 mcg in the hospital and 1000 mcg orally daily at discharge.   Since discharge, she wants to stay in the bed all the time. She does not feel safe walking.   Current MG symptoms: Ptosis: nothing significant Double vision: none Speech: Improved but still some slurring Chewing: has to cut food small, but no difficulty chewing Swallowing:  None Breathing: None Arm strength: No significant weakness Leg strength: Feels weak often   Current medications:  -Prednisone  10 mg daily -Mestinon  60 mg TID -Imuran  100 mg daily   Side effects: mild diarrhea    I switched patient to Vyvgart on 05/12/23 due to continued symptoms on IVIg. I reduced prednisone  to 7.5 mg daily.   08/07/23: Insurance denied Vyvgart Hytrulo, but would approve Vyvgart (IV formulation), so I switched order to this. She got her first infusion in 06/2022. I was told patient was having IV access issues, but patient has still been still getting the medication.   She feels she is doing well and improved.   She still occasionally falls and has to hold on to things, but improving. She is still taking her B12.   Current medications:  -Prednisone  7.5 mg daily -Imuran  100 mg daily -Mestinon  60 mg TID -Vyvgart IV   Side effects: none   11/06/23: Patient had a fall and cut her arm in 09/2023. She falls almost daily. She does not know why. She has a cane, but is here today without any assistive device. She denies neck or back pain. She has a walker but does not use it. She denies any lightheadedness or dizziness. She also mentions word finding difficulty.   She was doing PT at home but patient fired them because they were late or not showing up.    Current MG symptoms: Ptosis: feels droopy to her Double vision: none lately Speech: none Chewing: none Swallowing: none Breathing: none Arm strength: feels weak Leg strength: feels weak   Current medications:  -Imuran  100 mg daily -Prednisone  5 mg daily -Mestinon  60 mg taking it as needed, usually every day -Vyvgart Hytrulo once weekly for 4 weeks, with 4 weeks off between cycles: last dose was this week (unclear exact date)   Ppx: -B complex -Vit D and calcium    Side effects: No   Most recent Assessment and Plan (11/06/23): This is Jessica Mclean, a 78 y.o. female with: AChR ab positive generalized MG.  CT chest showed no evidence of thymoma. She is s/p IVIg from 11/14/22-11/17/21 and 12/14/22, 01/19/23, 02/18/23, 03/27/23, and 04/28/23. Imuran  was started 12/26/22. Patient was started on Vyvgart in 06/2022 due to concerns for poor control of MG despite prednisone , imuran , mestinon , and IVIg. She is doing well on Vyvgart but having trouble with IV access per nursing. She was switched to Vyvgart Hytrulo in 09/2023. Her MG appears to be well controlled with minimal manifestations currently. Frequent falls - the etiology of this is unclear. There is no objective leg weakness. Reflexes and sensation are normal arguing against myelopathy or neuropathy. She does not have neck or back pain. I am most concerned about food insecurity. Patient does not appear to be eating well. She was found to have several vitamin deficiencies (vit D, B6, and B12) thought to be the cause. She did not seem to have exacerbation of MG. We discussed meals on wheels and talking to her daughter, who is a physician  out of state, about these issues. She would benefit from PT. B12 deficiency Vit D deficiency B6 deficiency   Plan: -Continue Vyvgart Hytrulo 4 weeks on and 4 weeks off - home injections with [facility] currently -Continue Imuran  100 mg daily -Reduce Prednisone  to 2.5 mg daily -Continue Mestinon  60 mg TID PRN   For bone health, I recommend: Vit D 1000 international units daily Calcium  intake of 1000-1200 mg/day (diet or supplemental)   -Continue B complex   -Will retry PT for imbalance and falls -Discussed fall precautions  Since their last visit: *** Still getting Vyvgart Hytrulo?  PT? Falls? B12?  Current MG symptoms: Ptosis: *** Double vision: *** Speech: *** Chewing: *** Swallowing: *** Breathing: *** Arm strength: *** Leg strength: ***  Current medications:  -Vyvgart Hytrulo 4 weeks on and 4 weeks off - home injections with [facility] currently -Imuran  100 mg daily -Prednisone  to 2.5 mg  daily -Mestinon  60 mg TID PRN***   Ppx:  Vit D 1000 international units daily Calcium  intake of 1000-1200 mg/day (diet or supplemental)  Side effects: ***   ROS: Pertinent positive and negative systems reviewed in HPI. ***   MEDICATIONS:  Outpatient Encounter Medications as of 03/17/2024  Medication Sig   ACCU-CHEK GUIDE test strip USE TO CHECK BLOOD SUGAR 4 TIMES DAILY   Accu-Chek Softclix Lancets lancets CHECK BLOOD SUGAR 4 TIMES DAILY   albuterol  (VENTOLIN  HFA) 108 (90 Base) MCG/ACT inhaler Inhale 2 puffs into the lungs every 6 (six) hours as needed for wheezing or shortness of breath.   amLODipine  (NORVASC ) 10 MG tablet TAKE 1 TABLET BY MOUTH DAILY   aspirin -acetaminophen -caffeine (EXCEDRIN EXTRA STRENGTH) 250-250-65 MG tablet Take 2 tablets by mouth in the morning.   atorvastatin  (LIPITOR) 80 MG tablet TAKE 1 TABLET BY MOUTH DAILY   azaTHIOprine  (IMURAN ) 50 MG tablet Take 2 tablets (100 mg total) by mouth daily. Take 1 tablet (50 mg) for 1 week, then increase to 2 tablets (100 mg) thereafter.   blood glucose meter kit and supplies Dispense based on patient and insurance preference. Use up to four times daily as directed. (FOR ICD-10 E10.9, E11.9).   Blood Glucose Monitoring Suppl (ACCU-CHEK GUIDE) w/Device KIT    Blood Glucose Monitoring Suppl DEVI 1 each by Does not apply route in the morning, at noon, and at bedtime. Patient's insurance prefers Accu-Check, please substitute to this brand. DX E11.65   calcium -vitamin D  (OSCAL-500) 500-400 MG-UNIT tablet Take 1 tablet by mouth daily.   Coenzyme Q10 (COQ-10 PO) Take by mouth.   Collagen-Vitamin C-Biotin (COLLAGEN PO) Take by mouth.   Continuous Glucose Receiver (FREESTYLE LIBRE 3 READER) DEVI Use it to check blood glucose as instructed.   Continuous Glucose Sensor (FREESTYLE LIBRE 3 SENSOR) MISC Use it to check blood glucose as instructed.   DULoxetine  (CYMBALTA ) 30 MG capsule Take 1 capsule (30 mg total) by mouth daily.    FLAXSEED, LINSEED, PO Take by mouth.   KRILL OIL PO Take by mouth.   meclizine  (ANTIVERT ) 25 MG tablet Take 1 tablet (25 mg total) by mouth 3 (three) times daily as needed for dizziness.   Melatonin-Pyridoxine (MELATIN PO) Take 10 mg by mouth.   metFORMIN  (GLUCOPHAGE ) 1000 MG tablet TAKE 1 TABLET BY MOUTH TWICE  DAILY WITH MEALS   Multiple Vitamins-Minerals (PRESERVISION AREDS PO) Take 2 capsules by mouth daily.   omega-3 acid ethyl esters (LOVAZA ) 1 g capsule TAKE 2 CAPSULES BY MOUTH TWICE  DAILY   phenazopyridine  (PYRIDIUM ) 100 MG  tablet Take 1 tablet (100 mg total) by mouth 3 (three) times daily as needed for pain (Dysuria).   predniSONE  (DELTASONE ) 2.5 MG tablet Take 2 tablets (5 mg total) by mouth daily with breakfast.   pyridostigmine  (MESTINON ) 60 MG tablet TAKE 1 TABLET BY MOUTH 3 TIMES  DAILY   Semaglutide ,0.25 or 0.5MG /DOS, (OZEMPIC , 0.25 OR 0.5 MG/DOSE,) 2 MG/3ML SOPN Inject 0.5 mg into the skin every 7 (seven) days.   telmisartan  (MICARDIS ) 40 MG tablet TAKE 1 TABLET BY MOUTH DAILY   traMADol  (ULTRAM ) 50 MG tablet Take 1 tablet (50 mg total) by mouth every 12 (twelve) hours as needed.   vitamin B-12 (CYANOCOBALAMIN ) 100 MCG tablet Take 1 tablet (100 mcg total) by mouth daily.   No facility-administered encounter medications on file as of 03/17/2024.    PAST MEDICAL HISTORY: Past Medical History:  Diagnosis Date   Bleeding disorder    Cervical cancer (HCC) 2010   Diabetes mellitus without complication (HCC)    type 2   Diabetes mellitus without complication (HCC)    Heart murmur    High cholesterol    HLD (hyperlipidemia)    Hypertension    Mental disorder    depression; suicidal ideation in 2017   Sleep apnea    Vitamin D  deficiency     PAST SURGICAL HISTORY: Past Surgical History:  Procedure Laterality Date   BILATERAL OOPHORECTOMY     CESAREAN SECTION     x 4   CHOLECYSTECTOMY     COLOSTOMY     x2   CYSTOSCOPY WITH URETHRAL DILATATION N/A 11/15/2020    Procedure: CYSTOSCOPY WITH URETHRAL DILATION;  Surgeon: Sherrilee Belvie CROME, MD;  Location: [facility];  Service: Urology;  Laterality: N/A;   SALPINGECTOMY     TONSILLECTOMY     TONSILLECTOMY AND ADENOIDECTOMY      ALLERGIES: Allergies  Allergen Reactions   Other     Nickel staples   Poison Ivy Treatments     Poison Ivy     FAMILY HISTORY: Family History  Problem Relation Age of Onset   Cancer Father        kidney   Cancer Mother        lung   Other Brother        heart issues    SOCIAL HISTORY: Social History   Tobacco Use   Smoking status: Never   Smokeless tobacco: Never  Vaping Use   Vaping status: Never Used  Substance Use Topics   Alcohol use: Never   Drug use: Never   Social History   Social History Narrative   ** Merged History Encounter **    Right handed   Leave alone-1 level home   Caffeine--soda/coffee    What is your current occupation? retired   Do you live at home alone?yes              Objective:  Vital Signs:  LMP  (LMP Unknown)   General:*** General appearance: Awake and alert. No distress. Cooperative with exam.  Skin: No obvious rash or jaundice. HEENT: Atraumatic. Anicteric. Lungs: Non-labored breathing on room air  Heart: Regular Abdomen: Soft, non tender. Extremities: No edema. No obvious deformity.  Musculoskeletal: No obvious joint swelling.  Neurological: Mental Status: Alert. Speech fluent. No pseudobulbar affect Cranial Nerves: CNII: No RAPD. Visual fields intact. CNIII, IV, VI: PERRL. No nystagmus. EOMI. CN V: Facial sensation intact bilaterally to fine touch. [place] clench strong. Jaw jerk***. CN VII: Facial muscles symmetric and strong. No  ptosis at rest or after sustained upgaze***. CN VIII: Hears finger rub well bilaterally. CN IX: No hypophonia. CN X: Palate elevates symmetrically. CN XI: Full strength shoulder shrug bilaterally. CN XII: Tongue protrusion full and midline. No atrophy or fasciculations. No  significant dysarthria*** Motor: Tone is ***. *** fasciculations in *** extremities. *** atrophy. No grip or percussive myotonia.  Individual muscle group testing (MRC grade out of 5):  Movement     Neck flexion ***    Neck extension ***     Right Left   Shoulder abduction *** ***   Shoulder adduction *** ***   Shoulder ext rotation *** ***   Shoulder int rotation *** ***   Elbow flexion *** ***   Elbow extension *** ***   Wrist extension *** ***   Wrist flexion *** ***   Finger abduction - FDI *** ***   Finger abduction - ADM *** ***   Finger extension *** ***   Finger distal flexion - 2/3 *** ***   Finger distal flexion - 4/5 *** ***   Thumb flexion - FPL *** ***   Thumb abduction - APB *** ***    Hip flexion *** ***   Hip extension *** ***   Hip adduction *** ***   Hip abduction *** ***   Knee extension *** ***   Knee flexion *** ***   Dorsiflexion *** ***   Plantarflexion *** ***   Inversion *** ***   Eversion *** ***   Great toe extension *** ***   Great toe flexion *** ***     Reflexes:  Right Left  Bicep *** ***  Tricep *** ***  BrRad *** ***  Knee *** ***  Ankle *** ***   Pathological Reflexes: Babinski: *** response bilaterally*** Hoffman: *** Troemner: *** Pectoral: *** Palmomental: *** Facial: *** Midline tap: *** Sensation: Pinprick: *** Vibration: *** Temperature: *** Proprioception: *** Coordination: Intact finger-to- nose-finger and heel-to-shin bilaterally. Romberg negative.*** Gait: Able to rise from chair with arms crossed unassisted. Normal, narrow-based gait. Able to tandem walk. Able to walk on toes and heels.***   Lab and Test Review: New results: 01/26/24: HbA1c: 5.6 CMP significant for Cr 1.21 CBC w/ diff Hb 8.7 (chronic, long before Imuran )  Previously reviewed results: 08/07/23: B1 wnl B12: 552 B6 wnl Vit D wnl   HbA1c (09/24/23): 6.2   10/10/23: CBC w/ diff significant for Hb 9.3 (chronic)  CMP significant for  glucose 130  04/17/23: B6: 2.9 (low) Folate wnl B1 wnl   04/09/23: B12: 153 CBC significant for Hb 9.5 (chronic), MCV 90.0   04/08/23: Vit D: 27.45 HbA1c: 6.7 TSH wnl CMP unremarkable   01/15/23: HbA1c: 7.3 CMP significant for glucose 179, Cr 1.26   01/19/23: CMP significant for Cr 1.01, glucose 183 CBC significant for Hb 8.4 (previously 9.4), WBC and platelets wnl   11/23/22: B1 wnl TSH wnl TPMT: normal activity B12: 357 AChR binding ab: 2.05, modulating 81   09/17/22: AChR blocking abs: elevated to 52 HbA1c: 5.7 (high of 9.2 on 01/09/21) CMP unremarkable CBC w/ diff significant for Hb 9.4 (chronic) Lipid panel: Component     Latest Ref Rng 09/17/2022  Cholesterol, Total     100 - 199 mg/dL 814   Triglycerides     0 - 149 mg/dL 840 (H)   HDL Cholesterol     >39 mg/dL 52   VLDL Cholesterol Cal     5 - 40 mg/dL 28   LDL Chol Calc (NIH)  0 - 99 mg/dL 894 (H)   Total CHOL/HDL Ratio     0.0 - 4.4 ratio 3.6     TSH (04/15/22): 1.220 Vit D (04/15/22): 32.2   MRI brain wo contrast (10/14/22): FINDINGS: Brain: Diffusion imaging does not show any acute or subacute infarction. No abnormality affects the brainstem or cerebellum. Incidental perivascular spaces in the right mid brain. Cerebral hemispheres show mild chronic small-vessel ischemic change of the deep and subcortical white matter. No cortical or large vessel territory stroke. No mass lesion, hemorrhage, hydrocephalus or extra-axial collection.   Vascular: Major vessels at the base of the brain show flow.   Skull and upper cervical spine: Negative   Sinuses/Orbits: Clear/normal   Other: None   IMPRESSION: No acute finding. Mild chronic small-vessel ischemic change of the cerebral hemispheric white matter.   CT chest w contrast (12/03/22): FINDINGS: Cardiovascular: The cardiac size is normal. There is no pericardial effusion. There is three-vessel coronary artery calcification greatest in the  proximal LAD.   There are mild scattered calcific plaques in the aorta. The great vessels are clear. There is no aortic aneurysm, dissection or stenosis. The pulmonary arteries and veins are normal caliber. The pulmonary arteries are centrally clear.   Mediastinum/Nodes: No enlarged mediastinal, hilar, or axillary lymph nodes. Thyroid  gland, trachea, and esophagus demonstrate no significant findings.   Lungs/Pleura: Small right posterior diaphragmatic fat herniation. There is a calcified granuloma in the right lower lobe. The lungs hyperexpanded but clear of infiltrates and nodules. There is no pleural effusion, thickening or pneumothorax.   Upper Abdomen: The liver is mildly steatotic. Gallbladder is absent with prominent common bile duct measuring 12 mm most likely due to prior cholecystectomy, with mild central intrahepatic biliary prominence.   Laboratory and clinical correlation suggested. No acute upper abdominal findings. No chest wall mass is seen.   Musculoskeletal: Osteopenia with thoracic kyphosis and multilevel degenerative disc disease with spondylosis. No acute or other significant osseous findings. The ribcage is intact.   IMPRESSION: 1. No acute chest CT or significant mediastinal findings. 2. Aortic and coronary artery atherosclerosis. 3. Hyperinflated.  No focal infiltrates. 4. Mild hepatic steatosis. 5. Prior cholecystectomy with prominent common bile duct and mild central intrahepatic biliary prominence. 6. Osteopenia and degenerative change.  ASSESSMENT: This is Jessica Mclean, a 78 y.o. female with:  ***  Plan: ***  Return to clinic in ***  Total time spent reviewing records, interview, history/exam, documentation, and coordination of care on day of encounter:  *** min  Venetia Potters, MD

## 2024-03-14 DIAGNOSIS — E083292 Diabetes mellitus due to underlying condition with mild nonproliferative diabetic retinopathy without macular edema, left eye: Secondary | ICD-10-CM | POA: Diagnosis not present

## 2024-03-14 DIAGNOSIS — E083511 Diabetes mellitus due to underlying condition with proliferative diabetic retinopathy with macular edema, right eye: Secondary | ICD-10-CM | POA: Diagnosis not present

## 2024-03-14 DIAGNOSIS — H524 Presbyopia: Secondary | ICD-10-CM | POA: Diagnosis not present

## 2024-03-17 ENCOUNTER — Ambulatory Visit: Admitting: Neurology

## 2024-03-17 ENCOUNTER — Telehealth: Payer: Self-pay | Admitting: Neurology

## 2024-03-17 ENCOUNTER — Encounter: Payer: Self-pay | Admitting: Neurology

## 2024-03-17 NOTE — Telephone Encounter (Signed)
 Patient was a no show to our appointment today. My nurse Charlies spoke to her and she stated she had no way to get here. There has been previous concerns about food insecurity as well as she has no way to get out to get food.  I attempted to reach patient's emergency contact who is listed on DPR, Dr. Asia Eckenrode-Ghosh by phone. I left a message asking for a call back.  Venetia Potters, MD [facility]

## 2024-03-23 ENCOUNTER — Telehealth: Payer: Self-pay | Admitting: Neurology

## 2024-03-23 NOTE — Telephone Encounter (Signed)
 Patient's daughter returned my call. I wanted to make sure she was aware about her mother's health issues. She is a practicing physician in [place]  and has tried to get her mother to move close to her, but she does not want to. She thinks her mom is trying to hold on to independence. She states she has plenty of food in her home, she just will not eat it. Patient does not tell her daughter about all her problems but daughter will get calls from other doctors, so she is mostly aware.  She mentions that she is willing to help patient with rides to appts etc if we will call and leave her a message about the appointment.  All questions were answered.  Venetia Potters, MD [facility]

## 2024-03-24 ENCOUNTER — Encounter: Payer: Self-pay | Admitting: Neurology

## 2024-04-02 ENCOUNTER — Other Ambulatory Visit: Payer: Self-pay | Admitting: Internal Medicine

## 2024-04-02 DIAGNOSIS — E1169 Type 2 diabetes mellitus with other specified complication: Secondary | ICD-10-CM

## 2024-04-04 ENCOUNTER — Ambulatory Visit: Admitting: Urology

## 2024-04-04 ENCOUNTER — Encounter: Payer: Self-pay | Admitting: Urology

## 2024-04-04 VITALS — BP 157/72 | HR 94

## 2024-04-04 DIAGNOSIS — N3946 Mixed incontinence: Secondary | ICD-10-CM

## 2024-04-04 DIAGNOSIS — R300 Dysuria: Secondary | ICD-10-CM

## 2024-04-04 NOTE — Progress Notes (Unsigned)
 04/04/2024 12:57 PM   Jessica Mclean 04/29/1946 [id number]  Referring provider: Tobie Suzzane POUR, MD [address] [place],  [place] [address]  No chief complaint on file.   HPI:  Pt of Dr. Evia seen back today - she has a h/o meatal stricture dilated in OR Jun 2023 after in office cystoscopy was unsuccessful. No gross hematuria. She has incontinence. She takes tamsulosin . She has no constipation. She had bladder pain. Cultures were negative and this concerned her as their was not an infectious explanation for her pain. Bactrim  and cephalexin  helped her pain. Cx < 10 k.    She has a hx of cervical cancer treated with radiation therapy 7+ years ago. She has had hemorrhagic cystitis treated with hyperbaric oxygen therapy. In [place] .   Today, seen for the above. Discussed management of OAB/cystitis and rec cysto and PE in 2023. Sep 2025 Cr 1.2. She has constant leakage. Wears depends. No gross hematuria. She has dysuria. Takes azo. She is concerned and wants to make sure she doesn't have cancer.    She is a engineer, civil (consulting).   PMH: Past Medical History:  Diagnosis Date   Bleeding disorder    Cervical cancer (HCC) 2010   Diabetes mellitus without complication (HCC)    type 2   Diabetes mellitus without complication (HCC)    Heart murmur    High cholesterol    HLD (hyperlipidemia)    Hypertension    Mental disorder    depression; suicidal ideation in 2017   Sleep apnea    Vitamin D  deficiency     Surgical History: Past Surgical History:  Procedure Laterality Date   BILATERAL OOPHORECTOMY     CESAREAN SECTION     x 4   CHOLECYSTECTOMY     COLOSTOMY     x2   CYSTOSCOPY WITH URETHRAL DILATATION N/A 11/15/2020   Procedure: CYSTOSCOPY WITH URETHRAL DILATION;  Surgeon: Sherrilee Belvie CROME, MD;  Location: [facility];  Service: Urology;  Laterality: N/A;   SALPINGECTOMY     TONSILLECTOMY     TONSILLECTOMY AND ADENOIDECTOMY      Home Medications:  Allergies as of 04/04/2024        Reactions   Other    Nickel staples   Poison Ivy Treatments    Poison Ivy         Medication List        Accurate as of April 04, 2024 12:57 PM. If you have any questions, ask your nurse or doctor.          Accu-Chek Guide test strip Generic drug: glucose blood USE TO CHECK BLOOD SUGAR 4 TIMES DAILY   Accu-Chek Guide w/Device Kit   Blood Glucose Monitoring Suppl Devi 1 each by Does not apply route in the morning, at noon, and at bedtime. Patient's insurance prefers Accu-Check, please substitute to this brand. DX E11.65   Accu-Chek Softclix Lancets lancets CHECK BLOOD SUGAR 4 TIMES DAILY   albuterol  108 (90 Base) MCG/ACT inhaler Commonly known as: VENTOLIN  HFA Inhale 2 puffs into the lungs every 6 (six) hours as needed for wheezing or shortness of breath.   amLODipine  10 MG tablet Commonly known as: NORVASC  TAKE 1 TABLET BY MOUTH DAILY   atorvastatin  80 MG tablet Commonly known as: LIPITOR TAKE 1 TABLET BY MOUTH DAILY   azaTHIOprine  50 MG tablet Commonly known as: Imuran  Take 2 tablets (100 mg total) by mouth daily. Take 1 tablet (50 mg) for 1 week, then increase to 2  tablets (100 mg) thereafter.   blood glucose meter kit and supplies Dispense based on patient and insurance preference. Use up to four times daily as directed. (FOR ICD-10 E10.9, E11.9).   calcium -vitamin D  500-400 MG-UNIT tablet Commonly known as: OSCAL-500 Take 1 tablet by mouth daily.   COLLAGEN PO Take by mouth.   COQ-10 PO Take by mouth.   DULoxetine  30 MG capsule Commonly known as: Cymbalta  Take 1 capsule (30 mg total) by mouth daily.   Excedrin Extra Strength 250-250-65 MG tablet Generic drug: aspirin -acetaminophen -caffeine Take 2 tablets by mouth in the morning.   FLAXSEED (LINSEED) PO Take by mouth.   FreeStyle Libre 3 Reader [place] Use it to check blood glucose as instructed.   FreeStyle Libre 3 Sensor Misc Use it to check blood glucose as instructed.   KRILL OIL  PO Take by mouth.   meclizine  25 MG tablet Commonly known as: ANTIVERT  Take 1 tablet (25 mg total) by mouth 3 (three) times daily as needed for dizziness.   MELATIN PO Take 10 mg by mouth.   metFORMIN  1000 MG tablet Commonly known as: GLUCOPHAGE  TAKE 1 TABLET BY MOUTH TWICE  DAILY WITH MEALS   omega-3 acid ethyl esters 1 g capsule Commonly known as: LOVAZA  TAKE 2 CAPSULES BY MOUTH TWICE  DAILY   Ozempic  (0.25 or 0.5 MG/DOSE) 2 MG/3ML Sopn Generic drug: Semaglutide (0.25 or 0.5MG /DOS) INJECT SUBCUTANEOUSLY 0.5 MG  EVERY WEEK   phenazopyridine  100 MG tablet Commonly known as: Pyridium  Take 1 tablet (100 mg total) by mouth 3 (three) times daily as needed for pain (Dysuria).   predniSONE  2.5 MG tablet Commonly known as: DELTASONE  Take 2 tablets (5 mg total) by mouth daily with breakfast.   PRESERVISION AREDS PO Take 2 capsules by mouth daily.   pyridostigmine  60 MG tablet Commonly known as: MESTINON  TAKE 1 TABLET BY MOUTH 3 TIMES  DAILY   telmisartan  40 MG tablet Commonly known as: MICARDIS  TAKE 1 TABLET BY MOUTH DAILY   traMADol  50 MG tablet Commonly known as: ULTRAM  Take 1 tablet (50 mg total) by mouth every 12 (twelve) hours as needed.   vitamin B-12 100 MCG tablet Commonly known as: CYANOCOBALAMIN  Take 1 tablet (100 mcg total) by mouth daily.        Allergies:  Allergies  Allergen Reactions   Other     Nickel staples   Poison Ivy Treatments     Poison Ivy     Family History: Family History  Problem Relation Age of Onset   Cancer Father        kidney   Cancer Mother        lung   Other Brother        heart issues    Social History:  reports that she has never smoked. She has never used smokeless tobacco. She reports that she does not drink alcohol and does not use drugs.   Physical Exam: BP (!) 157/72   Pulse 94   LMP  (LMP Unknown)   Constitutional:  Alert and oriented, No acute distress. HEENT: [place] AT, moist mucus membranes.  Trachea  midline, no masses. Cardiovascular: No clubbing, cyanosis, or edema. Respiratory: Normal respiratory effort, no increased work of breathing. GI: Abdomen is soft, nontender, nondistended, no abdominal masses GU: No CVA tenderness Skin: No rashes, bruises or suspicious lesions. Neurologic: Grossly intact, no focal deficits, moving all 4 extremities. Psychiatric: Normal mood and affect.  Laboratory Data: Lab Results  Component Value Date   WBC 6.4  01/26/2024   HGB 8.7 (L) 01/26/2024   HCT 28.2 (L) 01/26/2024   MCV 89 01/26/2024   PLT 292 01/26/2024    Lab Results  Component Value Date   CREATININE 1.21 (H) 01/26/2024    No results found for: PSA  No results found for: TESTOSTERONE  Lab Results  Component Value Date   HGBA1C 5.6 01/26/2024    Urinalysis    Component Value Date/Time   COLORURINE YELLOW 11/23/2019 0133   APPEARANCEUR Hazy (A) 04/07/2022 1555   LABSPEC 1.016 11/23/2019 0133   PHURINE 5.0 11/23/2019 0133   GLUCOSEU Negative 04/07/2022 1555   HGBUR NEGATIVE 11/23/2019 0133   BILIRUBINUR Negative 04/07/2022 1555   KETONESUR negative 03/08/2020 1429   KETONESUR NEGATIVE 11/23/2019 0133   PROTEINUR 2+ (A) 04/07/2022 1555   PROTEINUR 30 (A) 11/23/2019 0133   UROBILINOGEN 0.2 03/08/2020 1429   NITRITE Negative 04/07/2022 1555   NITRITE NEGATIVE 11/23/2019 0133   LEUKOCYTESUR Negative 04/07/2022 1555   LEUKOCYTESUR NEGATIVE 11/23/2019 0133    Lab Results  Component Value Date   LABMICR 92.6 06/23/2023   WBCUA 0-5 04/07/2022   LABEPIT 0-10 04/07/2022   MUCUS Present 11/27/2020   BACTERIA None seen 04/07/2022    Pertinent Imaging: N/a   Assessment & Plan:    1. Mixed incontinence urge and stress (Primary) Stable - check PVR next time  - Urinalysis, Routine w reflex microscopic; Future  2. Dysuria - h/o meatal stricture. Will have her see Dr. Sherrilee for exam, cystoscopy. Recommended she f/u with GYN as far as her cervical ca and to  consider transvaginal estrogen (primary recommendation per the genitourinary syndrome of menopause guideline, but I don't know how to risk stratify her cancer.     No follow-ups on file.  Donnice Brooks, MD  [facility]  [address] [place], [place] [address] [phone]

## 2024-04-11 ENCOUNTER — Telehealth: Payer: Self-pay

## 2024-04-11 ENCOUNTER — Other Ambulatory Visit: Payer: Self-pay | Admitting: Internal Medicine

## 2024-04-11 DIAGNOSIS — N3020 Other chronic cystitis without hematuria: Secondary | ICD-10-CM

## 2024-04-11 MED ORDER — PHENAZOPYRIDINE HCL 100 MG PO TABS: 100.0000 mg | ORAL_TABLET | Freq: Three times a day (TID) | ORAL | 5 refills | Status: DC | PRN

## 2024-04-11 NOTE — Telephone Encounter (Signed)
Unable to get through.

## 2024-04-11 NOTE — Telephone Encounter (Signed)
 Called patient per Dr Tobie. To see dr patel next week as video visit.

## 2024-04-11 NOTE — Telephone Encounter (Signed)
 Copied from CRM [id number]. Topic: Clinical - Medication Refill >> Apr 11, 2024 11:18 AM Everette C wrote: Medication: phenazopyridine  (PYRIDIUM ) 100 MG tablet [[id number]]  Has the patient contacted their pharmacy? No (Agent: If no, request that the patient contact the pharmacy for the refill. If patient does not wish to contact the pharmacy document the reason why and proceed with request.) (Agent: If yes, when and what did the pharmacy advise?)  This is the patient's preferred pharmacy:  [facility] - [place], [place] - [address] [facility] [address] [place] [address] [place] [address] Phone: [phone] Fax: [phone]   Is this the correct pharmacy for this prescription? Yes If no, delete pharmacy and type the correct one.   Has the prescription been filled recently? Yes  Is the patient out of the medication? Yes  Has the patient been seen for an appointment in the last year OR does the patient have an upcoming appointment? Yes  Can we respond through MyChart? No  Agent: Please be advised that Rx refills may take up to 3 business days. We ask that you follow-up with your pharmacy.

## 2024-04-11 NOTE — Telephone Encounter (Signed)
 Copied from CRM [id number]. Topic: Clinical - Medical Advice >> Apr 11, 2024 11:24 AM Jessica Mclean wrote: Reason for CRM: The patient has called to share that they've been taking 4-5 AZO tablets at a time to help with their urinary discomfort. The patient shares that they have been ineffective so far and would like to discuss further with their PCP or a member of practice staff when possible

## 2024-05-11 NOTE — Progress Notes (Signed)
 "  I saw Jessica Mclean in neurology clinic on 05/26/23 in follow up for AChR ab positive, generalized myasthenia gravis.  HPI: Jessica Mclean is a 78 y.o. year old female with a history of HTN, IDDM, HLD, cervical cancer s/p radiation, TIA, depression, vit D deficiency, sleep apnea who we last saw on 11/06/23.  To briefly review: Initial consultation (11/06/22): Patient woke up over a year ago and had difficulty lifting her head and had vertigo. She called her daughter who is a doctor, who insisted patient go to the hospital. She also had dysarthria. This was first thing in the morning. She had the COVID vaccine the day before, so symptoms were attributed to that. The vertigo resolved. She continues to have falls. She is not sure why. She had an MRI finally in 09/2022 (about 1 month ago) that did not show evidence of a stroke. Over the last year, she has noticed symptoms are worse with fatigue, end of day, and with use. PCP checked AChR blocking ab and was elevated to 52.    Patient has stopped taking all of her medications due to concerns of what would make myasthenia worse.   Current MG symptoms: Ptosis: Yes, especially while watching TV Double vision: Yes Speech: Yes, worse when talking more and at end of day Chewing: Yes Swallowing: Yes, choking on solids. Liquids tend to dribble out of mouth. Breathing: Denies orthopnea Arm strength: No issues Leg strength: Does not think she is weak, but does endorse falls.   She is not on any medication for myasthenia gravis.   Of note patient takes gabapentin  100 mg qhs for diabetic neuropathy. This helps.   Patient is currently going to speech therapy. She is not sure she will continue due to costly co-pay.   She does not report any constitutional symptoms like fever, night sweats, anorexia or unintentional weight loss.   EtOH use: None  Restrictive diet? Vegan, not on B12. She has been eating much less lately, mostly bread and tomatoes Family  history of neuropathy/myopathy/neurologic disease? No   11/19/22: She got IVIg at home from 11/14/22-11/18/22. She had significant fatigue during the treatment.   Current medications:  -Prednisone  20 mg daily -Mestinon  60 mg TID S/p IVIg 0.4 g/kg/day for 5 days (11/14/22-11/18/22)   12/26/22: AChR abs were again positive, but lab work was otherwise unremarkable, including TPMT activity. CT chest showed no evidence of thymoma. Patient thinks the IVIg is really helping. She take take pills and can swallow much better.   She received her monthly maintenance of IVIg on 12/14/22.    Current medications:  -Prednisone  20 mg daily (has only been taking once daily) -Mestinon  60 mg TID   Side effects: Occasional diarrhea   05/12/23: Patient still feels very weak. She will be walking and banging into walls. She has had 1 fall since last visit. She states she is trying to eat more. She has occasional left sided chest pain.    She feels shaky in her hands. She does not write well.    Patient had IVIg on 01/19/23, 02/18/23, 03/27/23, and 04/28/23 (not scheduled for more at this point). She has been tolerating this well and happy with her home infusion.   Patient was hospitalized from 04/08/23-04/10/23 for two weeks of unsteady gait and generalized weakness. She had an unwitnessed syncopal episode on 04/08/23. CT head showed no acute process. B12 was found to be low, which was thought to be due to poor eating habits. MG exacerbation was felt  to be less likely as patient had just gotten IVIg recently and did not have fatigable weakness. Patient was given IM B12 1000 mcg in the hospital and 1000 mcg orally daily at discharge.   Since discharge, she wants to stay in the bed all the time. She does not feel safe walking.   Current MG symptoms: Ptosis: nothing significant Double vision: none Speech: Improved but still some slurring Chewing: has to cut food small, but no difficulty chewing Swallowing:  None Breathing: None Arm strength: No significant weakness Leg strength: Feels weak often   Current medications:  -Prednisone  10 mg daily -Mestinon  60 mg TID -Imuran  100 mg daily   Side effects: mild diarrhea    I switched patient to Vyvgart on 05/12/23 due to continued symptoms on IVIg. I reduced prednisone  to 7.5 mg daily.   08/07/23: Insurance denied Vyvgart Hytrulo, but would approve Vyvgart (IV formulation), so I switched order to this. She got her first infusion in 06/2022. I was told patient was having IV access issues, but patient has still been still getting the medication.   She feels she is doing well and improved.   She still occasionally falls and has to hold on to things, but improving. She is still taking her B12.   Current medications:  -Prednisone  7.5 mg daily -Imuran  100 mg daily -Mestinon  60 mg TID -Vyvgart IV   Side effects: none   11/06/23: Patient had a fall and cut her arm in 09/2023. She falls almost daily. She does not know why. She has a cane, but is here today without any assistive device. She denies neck or back pain. She has a walker but does not use it. She denies any lightheadedness or dizziness. She also mentions word finding difficulty.   She was doing PT at home but patient fired them because they were late or not showing up.    Current MG symptoms: Ptosis: feels droopy to her Double vision: none lately Speech: none Chewing: none Swallowing: none Breathing: none Arm strength: feels weak Leg strength: feels weak   Current medications:  -Imuran  100 mg daily -Prednisone  5 mg daily -Mestinon  60 mg taking it as needed, usually every day -Vyvgart Hytrulo once weekly for 4 weeks, with 4 weeks off between cycles: last dose was this week (unclear exact date)   Ppx: -B complex -Vit D and calcium    Side effects: No   Most recent Assessment and Plan (11/06/23): This is Jessica Mclean, a 78 y.o. female with: AChR ab positive generalized MG.  CT chest showed no evidence of thymoma. She is s/p IVIg from 11/14/22-11/17/21 and 12/14/22, 01/19/23, 02/18/23, 03/27/23, and 04/28/23. Imuran  was started 12/26/22. Patient was started on Vyvgart in 06/2022 due to concerns for poor control of MG despite prednisone , imuran , mestinon , and IVIg. She is doing well on Vyvgart but having trouble with IV access per nursing. She was switched to Vyvgart Hytrulo in 09/2023. Her MG appears to be well controlled with minimal manifestations currently. Frequent falls - the etiology of this is unclear. There is no objective leg weakness. Reflexes and sensation are normal arguing against myelopathy or neuropathy. She does not have neck or back pain. I am most concerned about food insecurity. Patient does not appear to be eating well. She was found to have several vitamin deficiencies (vit D, B6, and B12) thought to be the cause. She did not seem to have exacerbation of MG. We discussed meals on wheels and talking to her daughter, who is a  physician out of state, about these issues. She would benefit from PT. B12 deficiency Vit D deficiency B6 deficiency   Plan: -Continue Vyvgart Hytrulo 4 weeks on and 4 weeks off - home injections with [facility] currently -Continue Imuran  100 mg daily -Reduce Prednisone  to 2.5 mg daily -Continue Mestinon  60 mg TID PRN   For bone health, I recommend: Vit D 1000 international units daily Calcium  intake of 1000-1200 mg/day (diet or supplemental)   -Continue B complex   -Will retry PT for imbalance and falls -Discussed fall precautions  Since their last visit: Patient is still getting Vyvgart Hytrulo. She last got it on 05/21/24 and gets it again for 4th week of the cycle on 05/28/24.  She mentions having internal tremors (buzzing on the inside of her body). She is not passing out recently and not had any recent falls.  She is concerned about cancer given recurrent UTIs. She is wondering if she needs to see oncology. She will be  seeing her PCP next week and will discuss.  She fired her PT because they were unreliable. She has not been very active which she says is because she is afraid.  Current MG symptoms: Ptosis: feels droopy to her all the time Double vision: none Speech: usually good Chewing: no, but eats soft food Swallowing: no choking on water  or thin foods Breathing: none Arm strength: feels week and shaky but better Leg strength: feels week and shaky but better  Current medications:  -Vyvgart Hytrulo 4 weeks on and 4 weeks off - home injections with [facility] currently -Imuran  100 mg daily -Prednisone  to 2.5 mg daily -Mestinon  60 mg TID PRN - only taking in the morning   Ppx:  -Vit D 1000 international units daily -Calcium  intake of 1000-1200 mg/day (diet or supplemental) -B complex   MEDICATIONS:  Outpatient Encounter Medications as of 05/25/2024  Medication Sig   ACCU-CHEK GUIDE test strip USE TO CHECK BLOOD SUGAR 4 TIMES DAILY   Accu-Chek Softclix Lancets lancets CHECK BLOOD SUGAR 4 TIMES DAILY   albuterol  (VENTOLIN  HFA) 108 (90 Base) MCG/ACT inhaler Inhale 2 puffs into the lungs every 6 (six) hours as needed for wheezing or shortness of breath.   amLODipine  (NORVASC ) 10 MG tablet TAKE 1 TABLET BY MOUTH DAILY   aspirin -acetaminophen -caffeine (EXCEDRIN EXTRA STRENGTH) 250-250-65 MG tablet Take 2 tablets by mouth in the morning.   atorvastatin  (LIPITOR) 80 MG tablet TAKE 1 TABLET BY MOUTH DAILY   azaTHIOprine  (IMURAN ) 50 MG tablet Take 2 tablets (100 mg total) by mouth daily. Take 1 tablet (50 mg) for 1 week, then increase to 2 tablets (100 mg) thereafter.   blood glucose meter kit and supplies Dispense based on patient and insurance preference. Use up to four times daily as directed. (FOR ICD-10 E10.9, E11.9).   Blood Glucose Monitoring Suppl (ACCU-CHEK GUIDE) w/Device KIT    Blood Glucose Monitoring Suppl DEVI 1 each by Does not apply route in the morning, at noon, and at  bedtime. Patient's insurance prefers Accu-Check, please substitute to this brand. DX E11.65   calcium -vitamin D  (OSCAL-500) 500-400 MG-UNIT tablet Take 1 tablet by mouth daily.   Coenzyme Q10 (COQ-10 PO) Take by mouth.   Collagen-Vitamin C-Biotin (COLLAGEN PO) Take by mouth.   Continuous Glucose Receiver (FREESTYLE LIBRE 3 READER) DEVI Use it to check blood glucose as instructed.   Continuous Glucose Sensor (FREESTYLE LIBRE 3 SENSOR) MISC Use it to check blood glucose as instructed.   DULoxetine  (CYMBALTA ) 30 MG capsule  Take 1 capsule (30 mg total) by mouth daily. (Patient taking differently: Take 30 mg by mouth as needed.)   FLAXSEED, LINSEED, PO Take by mouth.   KRILL OIL PO Take by mouth.   meclizine  (ANTIVERT ) 25 MG tablet Take 1 tablet (25 mg total) by mouth 3 (three) times daily as needed for dizziness.   Melatonin-Pyridoxine (MELATIN PO) Take 10 mg by mouth.   metFORMIN  (GLUCOPHAGE ) 1000 MG tablet TAKE 1 TABLET BY MOUTH TWICE  DAILY WITH MEALS   Multiple Vitamins-Minerals (PRESERVISION AREDS PO) Take 2 capsules by mouth daily.   omega-3 acid ethyl esters (LOVAZA ) 1 g capsule TAKE 2 CAPSULES BY MOUTH TWICE  DAILY   OZEMPIC , 0.25 OR 0.5 MG/DOSE, 2 MG/3ML SOPN INJECT SUBCUTANEOUSLY 0.5 MG  EVERY WEEK   phenazopyridine  (PYRIDIUM ) 100 MG tablet Take 1 tablet (100 mg total) by mouth 3 (three) times daily as needed for pain (Dysuria).   predniSONE  (DELTASONE ) 2.5 MG tablet Take 2 tablets (5 mg total) by mouth daily with breakfast.   pyridostigmine  (MESTINON ) 60 MG tablet TAKE 1 TABLET BY MOUTH 3 TIMES  DAILY (Patient taking differently: Take 60 mg by mouth 3 (three) times daily. One time daily and sometimes 2 times a day)   telmisartan  (MICARDIS ) 40 MG tablet TAKE 1 TABLET BY MOUTH DAILY   traMADol  (ULTRAM ) 50 MG tablet Take 1 tablet (50 mg total) by mouth every 12 (twelve) hours as needed.   vitamin B-12 (CYANOCOBALAMIN ) 100 MCG tablet Take 1 tablet (100 mcg total) by mouth daily.   No  facility-administered encounter medications on file as of 05/25/2024.    PAST MEDICAL HISTORY: Past Medical History:  Diagnosis Date   Bleeding disorder    Cervical cancer (HCC) 2010   Diabetes mellitus without complication (HCC)    type 2   Diabetes mellitus without complication (HCC)    Heart murmur    High cholesterol    HLD (hyperlipidemia)    Hypertension    Mental disorder    depression; suicidal ideation in 2017   Sleep apnea    Vitamin D  deficiency     PAST SURGICAL HISTORY: Past Surgical History:  Procedure Laterality Date   BILATERAL OOPHORECTOMY     CESAREAN SECTION     x 4   CHOLECYSTECTOMY     COLOSTOMY     x2   CYSTOSCOPY WITH URETHRAL DILATATION N/A 11/15/2020   Procedure: CYSTOSCOPY WITH URETHRAL DILATION;  Surgeon: Sherrilee Belvie CROME, MD;  Location: [facility];  Service: Urology;  Laterality: N/A;   SALPINGECTOMY     TONSILLECTOMY     TONSILLECTOMY AND ADENOIDECTOMY      ALLERGIES: Allergies[1]  FAMILY HISTORY: Family History  Problem Relation Age of Onset   Cancer Father        kidney   Cancer Mother        lung   Other Brother        heart issues    SOCIAL HISTORY: Social History[2] Social History   Social History Narrative   ** Merged History Encounter **    Right handed   Leave alone-1 level home   Caffeine--soda/coffee    What is your current occupation? retired   Do you live at home alone?yes              Objective:  Vital Signs:  BP (!) 163/85   Pulse 62   Ht 5' 4.5 (1.638 m)   Wt 135 lb (61.2 kg)   LMP  (LMP Unknown)  SpO2 98%   BMI 22.81 kg/m   General: General appearance: Awake and alert. No distress. Cooperative with exam.  Skin: No obvious rash or jaundice. HEENT: Atraumatic. Anicteric. Lungs: Non-labored breathing on room air  Heart: Regular Extremities: No edema. No obvious deformity.  Musculoskeletal: No obvious joint swelling.  Neurological: Mental Status: Alert. Speech fluent. No pseudobulbar  affect Cranial Nerves: CNII: No RAPD. Visual fields intact. CNIII, IV, VI: PERRL. No nystagmus. EOMI. CN V: Facial sensation intact bilaterally to fine touch. CN VII: Facial muscles symmetric and strong. No ptosis at rest or after sustained upgaze. CN VIII: Hears finger rub well bilaterally. CN IX: No hypophonia. CN X: Palate elevates symmetrically. CN XI: Full strength shoulder shrug bilaterally. CN XII: Tongue protrusion full and midline. No atrophy or fasciculations. No significant dysarthria Motor: Tone is normal.  Individual muscle group testing (MRC grade out of 5):  Movement     Neck flexion 5    Neck extension 5     Right Left   Shoulder abduction 5 5   Elbow flexion 5 5   Elbow extension 5 5   Finger extension 5 5   Finger distal flexion - 2/3 5 5    Finger distal flexion - 4/5 5 5    Thumb flexion - FPL 5 5   Thumb abduction - APB 5 5    Hip flexion 4+ 4+   Hip extension 5 5   Hip adduction 5 5   Hip abduction 5 5   Knee extension 5 5   Knee flexion 5 5   Dorsiflexion 5 5   Plantarflexion 5 5    Reflexes:  Right Left  Bicep 2+ 2+  Tricep 2+ 2+  BrRad 2+ 2+  Knee 2+ 2+  Ankle 1+ 1+   Sensation: Pinprick: Intact in all extremities Vibration: Intact in all extremities Coordination: Intact finger-to- nose-finger bilaterally. Gait: Narrow based gait. Walks with cane, less steady when attempting to walk without it.   Lab and Test Review: New results: 01/26/24: HbA1c: 5.6 CMP significant for Cr 1.21 CBC w/ diff Hb 8.7 (chronic, long before Imuran )  Previously reviewed results: 08/07/23: B1 wnl B12: 552 B6 wnl Vit D wnl   HbA1c (09/24/23): 6.2   10/10/23: CBC w/ diff significant for Hb 9.3 (chronic)  CMP significant for glucose 130  04/17/23: B6: 2.9 (low) Folate wnl B1 wnl   04/09/23: B12: 153 CBC significant for Hb 9.5 (chronic), MCV 90.0   04/08/23: Vit D: 27.45 HbA1c: 6.7 TSH wnl CMP unremarkable   01/15/23: HbA1c: 7.3 CMP  significant for glucose 179, Cr 1.26   01/19/23: CMP significant for Cr 1.01, glucose 183 CBC significant for Hb 8.4 (previously 9.4), WBC and platelets wnl   11/23/22: B1 wnl TSH wnl TPMT: normal activity B12: 357 AChR binding ab: 2.05, modulating 81   09/17/22: AChR blocking abs: elevated to 52 HbA1c: 5.7 (high of 9.2 on 01/09/21) CMP unremarkable CBC w/ diff significant for Hb 9.4 (chronic) Lipid panel: Component     Latest Ref Rng 09/17/2022  Cholesterol, Total     100 - 199 mg/dL 814   Triglycerides     0 - 149 mg/dL 840 (H)   HDL Cholesterol     >39 mg/dL 52   VLDL Cholesterol Cal     5 - 40 mg/dL 28   LDL Chol Calc (NIH)     0 - 99 mg/dL 894 (H)   Total CHOL/HDL Ratio     0.0 - 4.4  ratio 3.6     TSH (04/15/22): 1.220 Vit D (04/15/22): 32.2   MRI brain wo contrast (10/14/22): FINDINGS: Brain: Diffusion imaging does not show any acute or subacute infarction. No abnormality affects the brainstem or cerebellum. Incidental perivascular spaces in the right mid brain. Cerebral hemispheres show mild chronic small-vessel ischemic change of the deep and subcortical white matter. No cortical or large vessel territory stroke. No mass lesion, hemorrhage, hydrocephalus or extra-axial collection.   Vascular: Major vessels at the base of the brain show flow.   Skull and upper cervical spine: Negative   Sinuses/Orbits: Clear/normal   Other: None   IMPRESSION: No acute finding. Mild chronic small-vessel ischemic change of the cerebral hemispheric white matter.   CT chest w contrast (12/03/22): FINDINGS: Cardiovascular: The cardiac size is normal. There is no pericardial effusion. There is three-vessel coronary artery calcification greatest in the proximal LAD.   There are mild scattered calcific plaques in the aorta. The great vessels are clear. There is no aortic aneurysm, dissection or stenosis. The pulmonary arteries and veins are normal caliber. The pulmonary  arteries are centrally clear.   Mediastinum/Nodes: No enlarged mediastinal, hilar, or axillary lymph nodes. Thyroid  gland, trachea, and esophagus demonstrate no significant findings.   Lungs/Pleura: Small right posterior diaphragmatic fat herniation. There is a calcified granuloma in the right lower lobe. The lungs hyperexpanded but clear of infiltrates and nodules. There is no pleural effusion, thickening or pneumothorax.   Upper Abdomen: The liver is mildly steatotic. Gallbladder is absent with prominent common bile duct measuring 12 mm most likely due to prior cholecystectomy, with mild central intrahepatic biliary prominence.   Laboratory and clinical correlation suggested. No acute upper abdominal findings. No chest wall mass is seen.   Musculoskeletal: Osteopenia with thoracic kyphosis and multilevel degenerative disc disease with spondylosis. No acute or other significant osseous findings. The ribcage is intact.   IMPRESSION: 1. No acute chest CT or significant mediastinal findings. 2. Aortic and coronary artery atherosclerosis. 3. Hyperinflated.  No focal infiltrates. 4. Mild hepatic steatosis. 5. Prior cholecystectomy with prominent common bile duct and mild central intrahepatic biliary prominence. 6. Osteopenia and degenerative change.  ASSESSMENT: This is Jessica Mclean, a 78 y.o. female with: AChR ab positive generalized MG. CT chest showed no evidence of thymoma. She is s/p IVIg from 11/14/22-11/17/21 and 12/14/22, 01/19/23, 02/18/23, 03/27/23, and 04/28/23. Imuran  was started 12/26/22. Patient was started on Vyvgart in 06/2022 due to concerns for poor control of MG despite prednisone , imuran , mestinon , and IVIg. She is doing well on Vyvgart but having trouble with IV access per nursing. She was switched to Vyvgart Hytrulo in 09/2023. Her MG appears to be well controlled with minimal manifestations currently. Frequent falls - improved from prior. The etiology of this is unclear.  There has not ever been objective leg weakness. Reflexes and sensation are normal arguing against myelopathy or neuropathy. She was found to have several vitamin deficiencies (vit D, B6, and B12) thought to be the cause. She did not seem to have exacerbation of MG. She also has proximal leg weakness likely from deconditioning. She has been ordered PT multiple times but states there is always an issue with the company coming too late at night. B12 deficiency Vit D deficiency B6 deficiency  Plan: -Continue Vyvgart Hytrulo 4 weeks on and 4 weeks off - home injections with [facility] currently -Continue Imuran  100 mg daily -Continue Prednisone  to 2.5 mg daily -Continue Mestinon  60 mg TID PRN -Will check labs (CBC  w/ diff and CMP at next visit)   For bone health, I recommend: Vit D 1000 international units daily Calcium  intake of 1000-1200 mg/day (diet or supplemental)   -Continue B complex  -Fall precautions discussed   Return to clinic in 6 months  Total time spent reviewing records, interview, history/exam, documentation, and coordination of care on day of encounter:  30 min  Venetia Potters, MD     [1]  Allergies Allergen Reactions   Other     Nickel staples   Poison Ivy Treatments     Poison Ivy   [2]  Social History Tobacco Use   Smoking status: Never   Smokeless tobacco: Never  Vaping Use   Vaping status: Never Used  Substance Use Topics   Alcohol use: Never   Drug use: Never   "

## 2024-05-25 ENCOUNTER — Encounter: Payer: Self-pay | Admitting: Neurology

## 2024-05-25 ENCOUNTER — Ambulatory Visit (INDEPENDENT_AMBULATORY_CARE_PROVIDER_SITE_OTHER): Admitting: Neurology

## 2024-05-25 VITALS — BP 157/67 | HR 62 | Ht 64.5 in | Wt 135.0 lb

## 2024-05-25 DIAGNOSIS — G7000 Myasthenia gravis without (acute) exacerbation: Secondary | ICD-10-CM | POA: Diagnosis not present

## 2024-05-25 DIAGNOSIS — E538 Deficiency of other specified B group vitamins: Secondary | ICD-10-CM

## 2024-05-25 MED ORDER — PYRIDOSTIGMINE BROMIDE 60 MG PO TABS: 60.0000 mg | ORAL_TABLET | Freq: Three times a day (TID) | ORAL | 9 refills | Status: AC | PRN

## 2024-05-25 MED ORDER — PREDNISONE 2.5 MG PO TABS: 2.5000 mg | ORAL_TABLET | Freq: Every day | ORAL | 11 refills | Status: AC

## 2024-05-25 MED ORDER — AZATHIOPRINE 50 MG PO TABS: 100.0000 mg | ORAL_TABLET | Freq: Every day | ORAL | 11 refills | Status: AC

## 2024-05-25 NOTE — Patient Instructions (Signed)
 -Continue Vyvgart Hytrulo 4 weeks on and 4 weeks off - home injections with [facility] currently -Continue Imuran  100 mg daily -Continue Prednisone  to 2.5 mg daily -Continue Mestinon  60 mg TID - AS NEEDED   For bone health, I recommend: Vit D 1000 international units daily Calcium  intake of 1000-1200 mg/day (diet or supplemental)   -Continue B complex   Return to clinic in 6 months  Go to nearest emergency room if you have severe weakness, difficulty breathing or swallowing as this could be a myasthenic crisis (flare).  Please let me know if you have any questions or concerns in the meantime.  The physicians and staff at [facility] are committed to providing excellent care. You may receive a survey requesting feedback about your experience at our office. We strive to receive very good responses to the survey questions. If you feel that your experience would prevent you from giving the office a very good  response, please contact our office to try to remedy the situation. We may be reached at [phone]. Thank you for taking the time out of your busy day to complete the survey.  Venetia Potters, MD [facility]  Preventing Falls at [facility] are common, often dreaded events in the lives of older people. Aside from the obvious injuries and even death that may result, fall can cause wide-ranging consequences including loss of independence, mental decline, decreased activity and mobility. Younger people are also at risk of falling, especially those with chronic illnesses and fatigue.  Ways to reduce risk for falling Examine diet and medications. Warm foods and alcohol dilate blood vessels, which can lead to dizziness when standing. Sleep aids, antidepressants and pain medications can also increase the likelihood of a fall.  Get a vision exam. Poor vision, cataracts and glaucoma increase the chances of falling.  Check foot gear. Shoes should fit snugly and have a  sturdy, nonskid sole and a broad, low heel  Participate in a physician-approved exercise program to build and maintain muscle strength and improve balance and coordination. Programs that use ankle weights or stretch bands are excellent for muscle-strengthening. Water  aerobics programs and low-impact Tai Chi programs have also been shown to improve balance and coordination.  Increase vitamin D  intake. Vitamin D  improves muscle strength and increases the amount of calcium  the body is able to absorb and deposit in bones.  How to prevent falls from common hazards Floors - Remove all loose wires, cords, and throw rugs. Minimize clutter. Make sure rugs are anchored and smooth. Keep furniture in its usual place.  Chairs -- Use chairs with straight backs, armrests and firm seats. Add firm cushions to existing pieces to add height.  Bathroom - Install grab bars and non-skid tape in the tub or shower. Use a bathtub transfer bench or a shower chair with a back support Use an elevated toilet seat and/or safety rails to assist standing from a low surface. Do not use towel racks or bathroom tissue holders to help you stand.  Lighting - Make sure halls, stairways, and entrances are well-lit. Install a night light in your bathroom or hallway. Make sure there is a light switch at the top and bottom of the staircase. Turn lights on if you get up in the middle of the night. Make sure lamps or light switches are within reach of the bed if you have to get up during the night.  Kitchen - Install non-skid rubber mats near the sink and stove. Clean spills immediately.  Store frequently used utensils, pots, pans between waist and eye level. This helps prevent reaching and bending. Sit when getting things out of lower cupboards.  Living room/ Bedrooms - Place furniture with wide spaces in between, giving enough room to move around. Establish a route through the living room that gives you something to hold onto as you  walk.  Stairs - Make sure treads, rails, and rugs are secure. Install a rail on both sides of the stairs. If stairs are a threat, it might be helpful to arrange most of your activities on the lower level to reduce the number of times you must climb the stairs.  Entrances and doorways - Install metal handles on the walls adjacent to the doorknobs of all doors to make it more secure as you travel through the doorway.  Tips for maintaining balance Keep at least one hand free at all times. Try using a backpack or fanny pack to hold things rather than carrying them in your hands. Never carry objects in both hands when walking as this interferes with keeping your balance.  Attempt to swing both arms from front to back while walking. This might require a conscious effort if Parkinson's disease has diminished your movement. It will, however, help you to maintain balance and posture, and reduce fatigue.  Consciously lift your feet off of the ground when walking. Shuffling and dragging of the feet is a common culprit in losing your balance.  When trying to navigate turns, use a U technique of facing forward and making a wide turn, rather than pivoting sharply.  Try to stand with your feet shoulder-length apart. When your feet are close together for any length of time, you increase your risk of losing your balance and falling.  Do one thing at a time. Don't try to walk and accomplish another task, such as reading or looking around. The decrease in your automatic reflexes complicates motor function, so the less distraction, the better.  Do not wear rubber or gripping soled shoes, they might catch on the floor and cause tripping.  Move slowly when changing positions. Use deliberate, concentrated movements and, if needed, use a grab bar or walking aid. Count 15 seconds between each movement. For example, when rising from a seated position, wait 15 seconds after standing to begin walking.  If balance is  a continuous problem, you might want to consider a walking aid such as a cane, walking stick, or walker. Once you've mastered walking with help, you might be ready to try it on your own again.

## 2024-05-31 ENCOUNTER — Ambulatory Visit (INDEPENDENT_AMBULATORY_CARE_PROVIDER_SITE_OTHER): Admitting: Internal Medicine

## 2024-05-31 ENCOUNTER — Encounter: Payer: Self-pay | Admitting: Internal Medicine

## 2024-05-31 VITALS — BP 126/62 | HR 66 | Ht 64.5 in | Wt 139.0 lb

## 2024-05-31 DIAGNOSIS — D509 Iron deficiency anemia, unspecified: Secondary | ICD-10-CM

## 2024-05-31 DIAGNOSIS — Z7984 Long term (current) use of oral hypoglycemic drugs: Secondary | ICD-10-CM

## 2024-05-31 DIAGNOSIS — N3941 Urge incontinence: Secondary | ICD-10-CM

## 2024-05-31 DIAGNOSIS — F339 Major depressive disorder, recurrent, unspecified: Secondary | ICD-10-CM

## 2024-05-31 DIAGNOSIS — N3020 Other chronic cystitis without hematuria: Secondary | ICD-10-CM | POA: Diagnosis not present

## 2024-05-31 DIAGNOSIS — E785 Hyperlipidemia, unspecified: Secondary | ICD-10-CM | POA: Diagnosis not present

## 2024-05-31 DIAGNOSIS — Z8541 Personal history of malignant neoplasm of cervix uteri: Secondary | ICD-10-CM

## 2024-05-31 DIAGNOSIS — I10 Essential (primary) hypertension: Secondary | ICD-10-CM | POA: Diagnosis not present

## 2024-05-31 DIAGNOSIS — G7000 Myasthenia gravis without (acute) exacerbation: Secondary | ICD-10-CM

## 2024-05-31 DIAGNOSIS — E1169 Type 2 diabetes mellitus with other specified complication: Secondary | ICD-10-CM

## 2024-05-31 MED ORDER — FREESTYLE LIBRE 3 PLUS SENSOR MISC: 3 refills | Status: AC

## 2024-05-31 MED ORDER — PHENAZOPYRIDINE HCL 100 MG PO TABS: 100.0000 mg | ORAL_TABLET | Freq: Three times a day (TID) | ORAL | 5 refills | Status: AC | PRN

## 2024-05-31 MED ORDER — DULOXETINE HCL 30 MG PO CPEP: 30.0000 mg | ORAL_CAPSULE | Freq: Every day | ORAL | 1 refills | Status: AC

## 2024-05-31 NOTE — Patient Instructions (Signed)
 Please start taking Cymbalta  regularly.  Please continue to take other medications as prescribed.  Please continue to follow low carb diet and ambulate as tolerated.

## 2024-05-31 NOTE — Assessment & Plan Note (Signed)
 Uncontrolled due to noncompliance Her daughter moved to [place], which has caused adjustment concern versus recurrence of depression symptoms On Cymbalta  30 mg QD, needs to take it regularly instead of as needed

## 2024-05-31 NOTE — Assessment & Plan Note (Addendum)
 Was on Myrbetric, but did not continue it Had cystoscopy with urethral dilation for stricture in 2022 Planned to get repeat cystoscopy on 06/01/24

## 2024-05-31 NOTE — Assessment & Plan Note (Signed)
 Lab Results  Component Value Date   HGBA1C 5.6 01/26/2024   Well-controlled Continue dose of Ozempic  0.5 mg qw for now, would be cautious due to weight loss in the past Since she is on oral prednisone  now, continue metformin  1000 mg twice daily Has CGM to monitor for hypoglycemia - Freestyle libre, but needs nursing guidance again to connect to her device, advised to bring the device to office Advised to follow diabetic diet On ARB and statin Diabetic eye exam: Advised to follow up with Ophthalmology for diabetic eye exam

## 2024-05-31 NOTE — Progress Notes (Unsigned)
 "  Established Patient Office Visit  Subjective:  Patient ID: Jessica Mclean, female    DOB: 10-01-1945  Age: 79 y.o. MRN: [id number]  CC:  Chief Complaint  Patient presents with   Diabetes    Follow up   Urinary Frequency    Frequent uti's would like to discuss referral to cancer specialist says she has hx of cancer.    HPI Jessica Mclean is a 79 y.o. female with past medical history of HTN, DM2, TIA, HLD and cervical ca. s/p radiation who presents for f/u of her chronic medical conditions.  Type II DM: She is taking Metformin  1000 mg BID and Ozempic  0.5 mg qw now. She has [employer] as she was able to use it better in the past, but still has difficulty connecting it. She has not checked blood glucose with regular fingerstick recently.  Of note, she has been taking prednisone  2.5 mg daily for myasthenia gravis.  HTN: BP is well-controlled. Takes medications regularly. Patient denies headache, chest pain, or palpitations.   Myasthenia gravis: Followed by neurology.  She is on Mestinon , Azathioprine  and oral prednisone  currently. She still has weakness and has gait disturbance, but has improved with B12 supplement. She has noticed improvement in visual disturbance and speech difficulty with myasthenia gravis treatment. She has history of CVA in the past.  Denies any new focal neurologic deficit. She has chronic weakness of the b/l LE.  Chronic cystitis: She has been taking Azo for dysuria. Denies any hematuria, flank or pelvic pain currently. She is undergoing urology evaluation for recurrent bladder pain.  MDD: She has felt worse recently.  She was still feeling depressed since her daughter moved to [place] .  She has stopped taking duloxetine  30 mg QD, which had improved her symptoms.  Her crying spells and anxiety symptoms have improved compared to previous visits.  Denies SI or HI currently.    Past Medical History:  Diagnosis Date   Bleeding disorder    Cervical cancer (HCC)  2010   Diabetes mellitus without complication (HCC)    type 2   Diabetes mellitus without complication (HCC)    Heart murmur    High cholesterol    HLD (hyperlipidemia)    Hypertension    Mental disorder    depression; suicidal ideation in 2017   Sleep apnea    Vitamin D  deficiency     Past Surgical History:  Procedure Laterality Date   BILATERAL OOPHORECTOMY     CESAREAN SECTION     x 4   CHOLECYSTECTOMY     COLOSTOMY     x2   CYSTOSCOPY WITH URETHRAL DILATATION N/A 11/15/2020   Procedure: CYSTOSCOPY WITH URETHRAL DILATION;  Surgeon: Sherrilee Belvie CROME, MD;  Location: [facility];  Service: Urology;  Laterality: N/A;   SALPINGECTOMY     TONSILLECTOMY     TONSILLECTOMY AND ADENOIDECTOMY      Family History  Problem Relation Age of Onset   Cancer Father        kidney   Cancer Mother        lung   Other Brother        heart issues    Social History   Socioeconomic History   Marital status: Divorced    Spouse name: Not on file   Number of children: 3   Years of education: Not on file   Highest education level: Not on file  Occupational History   Occupation: retired  Tobacco Use   Smoking status: Never  Smokeless tobacco: Never  Vaping Use   Vaping status: Never Used  Substance and Sexual Activity   Alcohol use: Never   Drug use: Never   Sexual activity: Not Currently    Birth control/protection: Post-menopausal  Other Topics Concern   Not on file  Social History Narrative   ** Merged History Encounter **    Right handed   Leave alone-1 level home   Caffeine--soda/coffee    What is your current occupation? retired   Do you live at home alone?yes             Social Drivers of Health   Tobacco Use: Low Risk (05/31/2024)   Patient History    Smoking Tobacco Use: Never    Smokeless Tobacco Use: Never    Passive Exposure: Not on file  Financial Resource Strain: High Risk (10/15/2023)   Overall Financial Resource Strain (CARDIA)    Difficulty of Paying  Living Expenses: Very hard  Food Insecurity: Food Insecurity Present (10/15/2023)   Hunger Vital Sign    Worried About Running Out of Food in the Last Year: Never true    Ran Out of Food in the Last Year: Sometimes true  Transportation Needs: Unmet Transportation Needs (10/15/2023)   PRAPARE - Administrator, Civil Service (Medical): Yes    Lack of Transportation (Non-Medical): No  Physical Activity: Insufficiently Active (10/15/2023)   Exercise Vital Sign    Days of Exercise per Week: 1 day    Minutes of Exercise per Session: 20 min  Stress: Stress Concern Present (10/15/2023)   Jessica Mclean    Feeling of Stress : To some extent  Social Connections: Socially Isolated (10/15/2023)   Social Connection and Isolation Panel    Frequency of Communication with Friends and Family: More than three times a week    Frequency of Social Gatherings with Friends and Family: Never    Attends Religious Services: Never    Database Administrator or Organizations: No    Attends Banker Meetings: Never    Marital Status: Divorced  Catering Manager Violence: Not At Risk (10/15/2023)   Humiliation, Afraid, Rape, and Kick Mclean    Fear of Current or Ex-Partner: No    Emotionally Abused: No    Physically Abused: No    Sexually Abused: No  Depression (PHQ2-9): Low Risk (05/31/2024)   Depression (PHQ2-9)    PHQ-2 Score: 0  Alcohol Screen: Low Risk (10/15/2023)   Alcohol Screen    Last Alcohol Screening Score (AUDIT): 0  Housing: Low Risk (10/15/2023)   Housing Stability Vital Sign    Unable to Pay for Housing in the Last Year: No    Number of Times Moved in the Last Year: 0    Homeless in the Last Year: No  Utilities: Not At Risk (10/15/2023)   [facility] Utilities    Threatened with loss of utilities: No  Health Literacy: Adequate Health Literacy (09/03/2023)   B1300 Health Literacy    Frequency of need for help with  medical instructions: Never    Outpatient Medications Prior to Visit  Medication Sig Dispense Refill   ACCU-CHEK GUIDE test strip USE TO CHECK BLOOD SUGAR 4 TIMES DAILY 400 strip 2   Accu-Chek Softclix Lancets lancets CHECK BLOOD SUGAR 4 TIMES DAILY 400 each 2   albuterol  (VENTOLIN  HFA) 108 (90 Base) MCG/ACT inhaler Inhale 2 puffs into the lungs every 6 (six) hours as needed for wheezing or  shortness of breath. 18 g 1   amLODipine  (NORVASC ) 10 MG tablet TAKE 1 TABLET BY MOUTH DAILY 100 tablet 2   aspirin -acetaminophen -caffeine (EXCEDRIN EXTRA STRENGTH) 250-250-65 MG tablet Take 2 tablets by mouth in the morning.     atorvastatin  (LIPITOR) 80 MG tablet TAKE 1 TABLET BY MOUTH DAILY 100 tablet 2   azaTHIOprine  (IMURAN ) 50 MG tablet Take 2 tablets (100 mg total) by mouth daily. 60 tablet 11   blood glucose meter kit and supplies Dispense based on patient and insurance preference. Use up to four times daily as directed. (FOR ICD-10 E10.9, E11.9). 1 each 0   Blood Glucose Monitoring Suppl (ACCU-CHEK GUIDE) w/Device KIT      Blood Glucose Monitoring Suppl DEVI 1 each by Does not apply route in the morning, at noon, and at bedtime. Patient's insurance prefers Accu-Check, please substitute to this brand. DX E11.65 1 each 0   calcium -vitamin D  (OSCAL-500) 500-400 MG-UNIT tablet Take 1 tablet by mouth daily.     Coenzyme Q10 (COQ-10 PO) Take by mouth.     Collagen-Vitamin C-Biotin (COLLAGEN PO) Take by mouth.     Continuous Glucose Receiver (FREESTYLE LIBRE 3 READER) DEVI Use it to check blood glucose as instructed. 1 each 0   FLAXSEED, LINSEED, PO Take by mouth.     KRILL OIL PO Take by mouth.     meclizine  (ANTIVERT ) 25 MG tablet Take 1 tablet (25 mg total) by mouth 3 (three) times daily as needed for dizziness. 30 tablet 1   Melatonin-Pyridoxine (MELATIN PO) Take 10 mg by mouth.     metFORMIN  (GLUCOPHAGE ) 1000 MG tablet TAKE 1 TABLET BY MOUTH TWICE  DAILY WITH MEALS 200 tablet 2   Multiple  Vitamins-Minerals (PRESERVISION AREDS PO) Take 2 capsules by mouth daily.     omega-3 acid ethyl esters (LOVAZA ) 1 g capsule TAKE 2 CAPSULES BY MOUTH TWICE  DAILY 180 capsule 7   OZEMPIC , 0.25 OR 0.5 MG/DOSE, 2 MG/3ML SOPN INJECT SUBCUTANEOUSLY 0.5 MG  EVERY WEEK 9 mL 3   predniSONE  (DELTASONE ) 2.5 MG tablet Take 1 tablet (2.5 mg total) by mouth daily with breakfast. 30 tablet 11   pyridostigmine  (MESTINON ) 60 MG tablet Take 1 tablet (60 mg total) by mouth 3 (three) times daily as needed. 100 tablet 9   telmisartan  (MICARDIS ) 40 MG tablet TAKE 1 TABLET BY MOUTH DAILY 100 tablet 2   vitamin B-12 (CYANOCOBALAMIN ) 100 MCG tablet Take 1 tablet (100 mcg total) by mouth daily. 90 tablet 3   Continuous Glucose Sensor (FREESTYLE LIBRE 3 SENSOR) MISC Use it to check blood glucose as instructed. 2 each 5   DULoxetine  (CYMBALTA ) 30 MG capsule Take 1 capsule (30 mg total) by mouth daily. (Patient taking differently: Take 30 mg by mouth as needed.) 90 capsule 1   phenazopyridine  (PYRIDIUM ) 100 MG tablet Take 1 tablet (100 mg total) by mouth 3 (three) times daily as needed for pain (Dysuria). 90 tablet 5   traMADol  (ULTRAM ) 50 MG tablet Take 1 tablet (50 mg total) by mouth every 12 (twelve) hours as needed. 20 tablet 0   No facility-administered medications prior to visit.    Allergies  Allergen Reactions   Other     Nickel staples   Poison Ivy Treatments     Poison Ivy     ROS Review of Systems  Constitutional:  Positive for fatigue. Negative for chills and fever.  HENT:  Positive for voice change. Negative for congestion, sinus pressure, sinus pain and  sore throat.   Eyes:  Negative for pain and discharge.  Respiratory:  Negative for cough and shortness of breath.   Cardiovascular:  Negative for chest pain and palpitations.  Gastrointestinal:  Negative for blood in stool, nausea and vomiting.  Endocrine: Negative for polydipsia and polyuria.  Genitourinary:  Positive for dysuria and urgency.  Negative for flank pain, frequency and hematuria.  Musculoskeletal:  Negative for neck pain and neck stiffness.  Skin:  Negative for rash.  Neurological:  Positive for speech difficulty and weakness.  Psychiatric/Behavioral:  Positive for dysphoric mood and sleep disturbance. Negative for agitation and behavioral problems. The patient is nervous/anxious.       Objective:    Physical Exam Vitals reviewed.  Constitutional:      General: She is not in acute distress.    Appearance: She is not diaphoretic.  HENT:     Head: Normocephalic and atraumatic.     Nose: Nose normal.     Mouth/Throat:     Mouth: Mucous membranes are moist.  Eyes:     General: No scleral icterus.    Extraocular Movements: Extraocular movements intact.  Cardiovascular:     Rate and Rhythm: Normal rate and regular rhythm.     Heart sounds: Normal heart sounds. No murmur heard. Pulmonary:     Breath sounds: No wheezing or rales.  Abdominal:     Palpations: Abdomen is soft.     Tenderness: There is abdominal tenderness (Chronic, suprapubic).  Musculoskeletal:     Cervical back: Neck supple. No tenderness.     Right lower leg: No edema.     Left lower leg: No edema.  Skin:    General: Skin is warm.     Findings: No rash.  Neurological:     General: No focal deficit present.     Mental Status: She is alert and oriented to person, place, and time.     Sensory: Sensory deficit (B/l feet) present.     Motor: Weakness (B/l UE and LE - 4/5) present.     Gait: Gait abnormal.     Comments: Slurred speech  Psychiatric:        Mood and Affect: Mood normal.        Behavior: Behavior normal.     BP 126/62   Pulse 66   Ht 5' 4.5 (1.638 m)   Wt 139 lb (63 kg)   LMP  (LMP Unknown)   SpO2 95%   BMI 23.49 kg/m  Wt Readings from Last 3 Encounters:  05/31/24 139 lb (63 kg)  05/25/24 135 lb (61.2 kg)  01/26/24 136 lb 9.6 oz (62 kg)    Lab Results  Component Value Date   TSH 0.484 04/08/2023   Lab  Results  Component Value Date   WBC 6.2 05/31/2024   HGB 8.0 (L) 05/31/2024   HCT 25.9 (L) 05/31/2024   MCV 94 05/31/2024   PLT 278 05/31/2024   Lab Results  Component Value Date   NA 139 05/31/2024   K 4.4 05/31/2024   CO2 20 05/31/2024   GLUCOSE 107 (H) 05/31/2024   BUN 21 05/31/2024   CREATININE 1.04 (H) 05/31/2024   BILITOT 0.4 05/31/2024   ALKPHOS 76 05/31/2024   AST 17 05/31/2024   ALT 8 05/31/2024   PROT 6.3 05/31/2024   ALBUMIN 4.6 05/31/2024   CALCIUM  10.1 05/31/2024   ANIONGAP 10 10/10/2023   EGFR 55 (L) 05/31/2024   Lab Results  Component Value Date   CHOL  185 09/17/2022   Lab Results  Component Value Date   HDL 52 09/17/2022   Lab Results  Component Value Date   LDLCALC 105 (H) 09/17/2022   Lab Results  Component Value Date   TRIG 159 (H) 09/17/2022   Lab Results  Component Value Date   CHOLHDL 3.6 09/17/2022   Lab Results  Component Value Date   HGBA1C 5.7 (H) 05/31/2024      Assessment & Plan:   Problem List Items Addressed This Visit       Cardiovascular and Mediastinum   Essential hypertension   BP Readings from Last 1 Encounters:  05/31/24 126/62   Well-controlled with telmisartan  40 mg daily and amlodipine  10 mg daily now Counseled for compliance with the medications Advised DASH diet and moderate exercise/walking as tolerated      Relevant Orders   CBC with Differential/Platelet (Completed)   CMP14+EGFR (Completed)     Endocrine   Type 2 diabetes mellitus with hyperlipidemia (HCC) - Primary   Lab Results  Component Value Date   HGBA1C 5.6 01/26/2024   Well-controlled Continue dose of Ozempic  0.5 mg qw for now, would be cautious due to weight loss in the past Since she is on oral prednisone  now, continue metformin  1000 mg twice daily Has CGM to monitor for hypoglycemia - Freestyle libre, but needs nursing guidance again to connect to her device, advised to bring the device to office Advised to follow diabetic diet On  ARB and statin Diabetic eye exam: Advised to follow up with Ophthalmology for diabetic eye exam      Relevant Medications   Continuous Glucose Sensor (FREESTYLE LIBRE 3 PLUS SENSOR) MISC   Other Relevant Orders   Microalbumin / creatinine urine ratio (Completed)   Hemoglobin A1c (Completed)     Nervous and Auditory   Myasthenia gravis without acute exacerbation (HCC)   Followed by neurology S/p IVIG treatment On Mestinon , Azathioprine  and oral prednisone  - symptoms had improved initially Her current weakness is due to nutritional deficiencies and myasthenia gravis -symptoms improved since restarting oral prednisone  Gets B12 injections      Relevant Medications   DULoxetine  (CYMBALTA ) 30 MG capsule     Genitourinary   Chronic cystitis   Pyridium  as needed for chronic dysuria Had referred to urology - cystoscopy tomorrow      Relevant Medications   phenazopyridine  (PYRIDIUM ) 100 MG tablet     Other   Depression, recurrent (Chronic)   Uncontrolled due to noncompliance Her daughter moved to [place], which has caused adjustment concern versus recurrence of depression symptoms On Cymbalta  30 mg QD, needs to take it regularly instead of as needed      Relevant Medications   DULoxetine  (CYMBALTA ) 30 MG capsule   Urge incontinence   Was on Myrbetric, but did not continue it Had cystoscopy with urethral dilation for stricture in 2022 Planned to get repeat cystoscopy on 06/01/24      Microcytic anemia   CBC reviewed No signs of active bleeding Needs to take iron supplements for now Referred to hematology oncology      History of cervical cancer   S/p radiation Has chronic cystitis Seen Ob/Gyn for PAP smear - showed ASCUS, needs regular follow-up Referred to oncology      Relevant Orders   Ambulatory referral to Hematology / Oncology     Meds ordered this encounter  Medications   phenazopyridine  (PYRIDIUM ) 100 MG tablet    Sig: Take 1 tablet (100 mg total) by mouth  3 (three) times daily as needed for pain (Dysuria).    Dispense:  90 tablet    Refill:  5   Continuous Glucose Sensor (FREESTYLE LIBRE 3 PLUS SENSOR) MISC    Sig: Change sensor every 15 days.    Dispense:  6 each    Refill:  3   DULoxetine  (CYMBALTA ) 30 MG capsule    Sig: Take 1 capsule (30 mg total) by mouth daily.    Dispense:  90 capsule    Refill:  1    Dose change - 09/24/23    Follow-up: Return in about 4 months (around 09/28/2024) for DM.    Suzzane MARLA Blanch, MD "

## 2024-05-31 NOTE — Assessment & Plan Note (Signed)
 BP Readings from Last 1 Encounters:  05/31/24 126/62   Well-controlled with telmisartan  40 mg daily and amlodipine  10 mg daily now Counseled for compliance with the medications Advised DASH diet and moderate exercise/walking as tolerated

## 2024-05-31 NOTE — Assessment & Plan Note (Signed)
 Followed by neurology S/p IVIG treatment On Mestinon , Azathioprine  and oral prednisone  - symptoms had improved initially Her current weakness is due to nutritional deficiencies and myasthenia gravis -symptoms improved since restarting oral prednisone  Gets B12 injections

## 2024-05-31 NOTE — Assessment & Plan Note (Signed)
 Pyridium  as needed for chronic dysuria Recently completed Bactrim  for UTI Had referred to urology - cystoscopy tomorrow

## 2024-06-01 ENCOUNTER — Ambulatory Visit: Payer: Self-pay | Admitting: Internal Medicine

## 2024-06-01 ENCOUNTER — Other Ambulatory Visit: Admitting: Urology

## 2024-06-01 LAB — CBC WITH DIFFERENTIAL/PLATELET
Basophils Absolute: 0.1 x10E3/uL (ref 0.0–0.2)
Basos: 1 %
EOS (ABSOLUTE): 0.1 x10E3/uL (ref 0.0–0.4)
Eos: 2 %
Hematocrit: 25.9 % — ABNORMAL LOW (ref 34.0–46.6)
Hemoglobin: 8 g/dL — ABNORMAL LOW (ref 11.1–15.9)
Immature Grans (Abs): 0 x10E3/uL (ref 0.0–0.1)
Immature Granulocytes: 0 %
Lymphocytes Absolute: 1.3 x10E3/uL (ref 0.7–3.1)
Lymphs: 20 %
MCH: 28.9 pg (ref 26.6–33.0)
MCHC: 30.9 g/dL — ABNORMAL LOW (ref 31.5–35.7)
MCV: 94 fL (ref 79–97)
Monocytes Absolute: 0.8 x10E3/uL (ref 0.1–0.9)
Monocytes: 13 %
Neutrophils Absolute: 3.9 x10E3/uL (ref 1.4–7.0)
Neutrophils: 64 %
Platelets: 278 x10E3/uL (ref 150–450)
RBC: 2.77 x10E6/uL — ABNORMAL LOW (ref 3.77–5.28)
RDW: 14.5 % (ref 11.7–15.4)
WBC: 6.2 x10E3/uL (ref 3.4–10.8)

## 2024-06-01 LAB — CMP14+EGFR
ALT: 8 IU/L (ref 0–32)
AST: 17 IU/L (ref 0–40)
Albumin: 4.6 g/dL (ref 3.8–4.8)
Alkaline Phosphatase: 76 IU/L (ref 49–135)
BUN/Creatinine Ratio: 20 (ref 12–28)
BUN: 21 mg/dL (ref 8–27)
Bilirubin Total: 0.4 mg/dL (ref 0.0–1.2)
CO2: 20 mmol/L (ref 20–29)
Calcium: 10.1 mg/dL (ref 8.7–10.3)
Chloride: 103 mmol/L (ref 96–106)
Creatinine, Ser: 1.04 mg/dL — ABNORMAL HIGH (ref 0.57–1.00)
Globulin, Total: 1.7 g/dL (ref 1.5–4.5)
Glucose: 107 mg/dL — ABNORMAL HIGH (ref 70–99)
Potassium: 4.4 mmol/L (ref 3.5–5.2)
Sodium: 139 mmol/L (ref 134–144)
Total Protein: 6.3 g/dL (ref 6.0–8.5)
eGFR: 55 mL/min/1.73 — ABNORMAL LOW

## 2024-06-01 LAB — HEMOGLOBIN A1C
Est. average glucose Bld gHb Est-mCnc: 117 mg/dL
Hgb A1c MFr Bld: 5.7 % — ABNORMAL HIGH (ref 4.8–5.6)

## 2024-06-02 ENCOUNTER — Telehealth: Payer: Self-pay

## 2024-06-02 LAB — MICROALBUMIN / CREATININE URINE RATIO
Creatinine, Urine: 142 mg/dL
Microalb/Creat Ratio: 88 mg/g{creat} — ABNORMAL HIGH (ref 0–29)
Microalbumin, Urine: 124.4 ug/mL

## 2024-06-02 NOTE — Telephone Encounter (Signed)
 Copied from CRM [id number]. Topic: Appointments - Appointment Info/Confirmation >> Jun 02, 2024  2:41 PM Nathanel BROCKS wrote: Patient/patient representative is calling for information regarding an appointment.    Pt called and stated that she missed urology appt yesterday due to issue with transportation. She wants to reschedule the appt but she needs to schedule transportation as well. She wanted to speak with Dr Tobie directly but I advised her that he was with patients. Please call pt back and see how to help her.

## 2024-06-02 NOTE — Assessment & Plan Note (Signed)
 S/p radiation Has chronic cystitis Seen Ob/Gyn for PAP smear - showed ASCUS, needs regular follow-up Referred to oncology

## 2024-06-02 NOTE — Assessment & Plan Note (Signed)
 CBC reviewed No signs of active bleeding Needs to take iron supplements for now Referred to hematology oncology

## 2024-06-23 ENCOUNTER — Telehealth: Payer: Self-pay

## 2024-06-23 ENCOUNTER — Other Ambulatory Visit: Payer: Self-pay | Admitting: Internal Medicine

## 2024-06-23 DIAGNOSIS — N3020 Other chronic cystitis without hematuria: Secondary | ICD-10-CM

## 2024-06-23 MED ORDER — SULFAMETHOXAZOLE-TRIMETHOPRIM 800-160 MG PO TABS: 1.0000 | ORAL_TABLET | Freq: Two times a day (BID) | ORAL | 0 refills | Status: DC

## 2024-06-23 NOTE — Telephone Encounter (Signed)
 Copied from CRM [id number]. Topic: Clinical - Medication Question >> Jun 23, 2024 11:51 AM Alfonso HERO wrote: Reason for CRM: patient calling to ask if Dr Tobie sent Rx for a piece that goes to her diabetic supplies and something for a UTI. She doesn't have any details or names of anything and asking for a call back.

## 2024-06-24 ENCOUNTER — Other Ambulatory Visit: Payer: Self-pay

## 2024-06-24 DIAGNOSIS — N3020 Other chronic cystitis without hematuria: Secondary | ICD-10-CM

## 2024-06-24 MED ORDER — SULFAMETHOXAZOLE-TRIMETHOPRIM 800-160 MG PO TABS: 1.0000 | ORAL_TABLET | Freq: Two times a day (BID) | ORAL | 0 refills | Status: AC

## 2024-06-24 NOTE — Telephone Encounter (Signed)
 Lvm to return call

## 2024-06-24 NOTE — Telephone Encounter (Unsigned)
 Copied from CRM #[id number]. Topic: General - Other >> Jun 24, 2024  2:02 PM Kevelyn M wrote: Reason for CRM: patient is calling back for [facility]. Attempted to call 2 times.  Call back # [phone]

## 2024-06-24 NOTE — Telephone Encounter (Signed)
 Pt informed. Per her request, resent bactrim  to [facility]. Pt unsure what piece of diabetic supply she needs

## 2024-07-15 ENCOUNTER — Inpatient Hospital Stay

## 2024-07-20 ENCOUNTER — Other Ambulatory Visit: Admitting: Urology

## 2024-07-26 ENCOUNTER — Encounter: Admitting: Obstetrics & Gynecology

## 2024-08-05 ENCOUNTER — Ambulatory Visit: Admitting: Neurology

## 2024-09-05 ENCOUNTER — Ambulatory Visit

## 2024-09-28 ENCOUNTER — Ambulatory Visit: Payer: Self-pay | Admitting: Internal Medicine

## 2024-11-25 ENCOUNTER — Ambulatory Visit: Payer: Self-pay | Admitting: Neurology
